# Patient Record
Sex: Female | Born: 1960
Health system: Southern US, Community
[De-identification: ages and names within clinical notes are randomized; demographics above are authoritative.]

## PROBLEM LIST (undated history)

## (undated) DIAGNOSIS — K589 Irritable bowel syndrome without diarrhea: Secondary | ICD-10-CM

## (undated) DIAGNOSIS — F17200 Nicotine dependence, unspecified, uncomplicated: Secondary | ICD-10-CM

## (undated) DIAGNOSIS — M549 Dorsalgia, unspecified: Secondary | ICD-10-CM

## (undated) DIAGNOSIS — M797 Fibromyalgia: Secondary | ICD-10-CM

## (undated) DIAGNOSIS — B009 Herpesviral infection, unspecified: Secondary | ICD-10-CM

## (undated) DIAGNOSIS — J302 Other seasonal allergic rhinitis: Secondary | ICD-10-CM

## (undated) DIAGNOSIS — M542 Cervicalgia: Secondary | ICD-10-CM

## (undated) DIAGNOSIS — F32A Depression, unspecified: Secondary | ICD-10-CM

## (undated) DIAGNOSIS — M25519 Pain in unspecified shoulder: Secondary | ICD-10-CM

## (undated) DIAGNOSIS — I700 Atherosclerosis of aorta: Secondary | ICD-10-CM

## (undated) DIAGNOSIS — IMO0002 Reserved for concepts with insufficient information to code with codable children: Secondary | ICD-10-CM

## (undated) DIAGNOSIS — Z923 Personal history of irradiation: Secondary | ICD-10-CM

## (undated) DIAGNOSIS — K219 Gastro-esophageal reflux disease without esophagitis: Secondary | ICD-10-CM

## (undated) DIAGNOSIS — F329 Major depressive disorder, single episode, unspecified: Secondary | ICD-10-CM

## (undated) DIAGNOSIS — M5416 Radiculopathy, lumbar region: Secondary | ICD-10-CM

## (undated) DIAGNOSIS — G8929 Other chronic pain: Secondary | ICD-10-CM

## (undated) DIAGNOSIS — Z8719 Personal history of other diseases of the digestive system: Secondary | ICD-10-CM

## (undated) DIAGNOSIS — R011 Cardiac murmur, unspecified: Secondary | ICD-10-CM

## (undated) DIAGNOSIS — M719 Bursopathy, unspecified: Secondary | ICD-10-CM

## (undated) DIAGNOSIS — F419 Anxiety disorder, unspecified: Secondary | ICD-10-CM

## (undated) DIAGNOSIS — E041 Nontoxic single thyroid nodule: Secondary | ICD-10-CM

## (undated) DIAGNOSIS — M1990 Unspecified osteoarthritis, unspecified site: Secondary | ICD-10-CM

## (undated) DIAGNOSIS — Z8521 Personal history of malignant neoplasm of larynx: Secondary | ICD-10-CM

## (undated) DIAGNOSIS — J449 Chronic obstructive pulmonary disease, unspecified: Secondary | ICD-10-CM

## (undated) DIAGNOSIS — E039 Hypothyroidism, unspecified: Secondary | ICD-10-CM

## (undated) HISTORY — DX: Herpesviral infection, unspecified: B00.9

## (undated) HISTORY — DX: Irritable bowel syndrome, unspecified: K58.9

## (undated) HISTORY — DX: Cardiac murmur, unspecified: R01.1

## (undated) HISTORY — DX: Anxiety disorder, unspecified: F41.9

## (undated) HISTORY — PX: HERNIA REPAIR: SHX51

## (undated) HISTORY — DX: Depression, unspecified: F32.A

## (undated) HISTORY — DX: Major depressive disorder, single episode, unspecified: F32.9

## (undated) HISTORY — PX: CHOLECYSTECTOMY: SHX55

## (undated) HISTORY — DX: Other seasonal allergic rhinitis: J30.2

## (undated) HISTORY — DX: Gastro-esophageal reflux disease without esophagitis: K21.9

## (undated) HISTORY — PX: HIATAL HERNIA REPAIR: SHX195

## (undated) HISTORY — DX: Unspecified osteoarthritis, unspecified site: M19.90

## (undated) HISTORY — DX: Bursopathy, unspecified: M71.9

## (undated) HISTORY — DX: Nontoxic single thyroid nodule: E04.1

## (undated) HISTORY — DX: Reserved for concepts with insufficient information to code with codable children: IMO0002

## (undated) HISTORY — DX: Fibromyalgia: M79.7

---
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
## 1998-04-27 ENCOUNTER — Emergency Department (HOSPITAL_COMMUNITY): Admission: EM | Admit: 1998-04-27 | Discharge: 1998-04-27 | Payer: Self-pay | Admitting: Emergency Medicine

## 1998-05-16 ENCOUNTER — Emergency Department (HOSPITAL_COMMUNITY): Admission: EM | Admit: 1998-05-16 | Discharge: 1998-05-16 | Payer: Self-pay | Admitting: Internal Medicine

## 1998-06-03 ENCOUNTER — Ambulatory Visit (HOSPITAL_COMMUNITY): Admission: RE | Admit: 1998-06-03 | Discharge: 1998-06-03 | Payer: Self-pay | Admitting: *Deleted

## 1998-06-09 ENCOUNTER — Emergency Department (HOSPITAL_COMMUNITY): Admission: EM | Admit: 1998-06-09 | Discharge: 1998-06-09 | Payer: Self-pay | Admitting: Emergency Medicine

## 1998-06-11 ENCOUNTER — Emergency Department (HOSPITAL_COMMUNITY): Admission: EM | Admit: 1998-06-11 | Discharge: 1998-06-11 | Payer: Self-pay | Admitting: Emergency Medicine

## 1998-06-12 ENCOUNTER — Emergency Department (HOSPITAL_COMMUNITY): Admission: EM | Admit: 1998-06-12 | Discharge: 1998-06-13 | Payer: Self-pay | Admitting: Endocrinology

## 1998-06-21 ENCOUNTER — Encounter: Admission: RE | Admit: 1998-06-21 | Discharge: 1998-06-21 | Payer: Self-pay | Admitting: Hematology and Oncology

## 1999-01-25 ENCOUNTER — Emergency Department (HOSPITAL_COMMUNITY): Admission: EM | Admit: 1999-01-25 | Discharge: 1999-01-25 | Payer: Self-pay

## 1999-05-18 ENCOUNTER — Emergency Department (HOSPITAL_COMMUNITY): Admission: EM | Admit: 1999-05-18 | Discharge: 1999-05-18 | Payer: Self-pay | Admitting: Emergency Medicine

## 1999-06-15 ENCOUNTER — Emergency Department (HOSPITAL_COMMUNITY): Admission: EM | Admit: 1999-06-15 | Discharge: 1999-06-15 | Payer: Self-pay | Admitting: Emergency Medicine

## 1999-07-18 ENCOUNTER — Emergency Department (HOSPITAL_COMMUNITY): Admission: EM | Admit: 1999-07-18 | Discharge: 1999-07-18 | Payer: Self-pay | Admitting: Emergency Medicine

## 1999-07-18 ENCOUNTER — Encounter: Payer: Self-pay | Admitting: Emergency Medicine

## 1999-08-30 ENCOUNTER — Ambulatory Visit (HOSPITAL_COMMUNITY): Admission: RE | Admit: 1999-08-30 | Discharge: 1999-08-30 | Payer: Self-pay | Admitting: Gastroenterology

## 1999-08-30 ENCOUNTER — Encounter (INDEPENDENT_AMBULATORY_CARE_PROVIDER_SITE_OTHER): Payer: Self-pay | Admitting: Specialist

## 1999-12-12 ENCOUNTER — Encounter: Payer: Self-pay | Admitting: Emergency Medicine

## 1999-12-12 ENCOUNTER — Emergency Department (HOSPITAL_COMMUNITY): Admission: EM | Admit: 1999-12-12 | Discharge: 1999-12-12 | Payer: Self-pay | Admitting: Internal Medicine

## 1999-12-26 ENCOUNTER — Emergency Department (HOSPITAL_COMMUNITY): Admission: EM | Admit: 1999-12-26 | Discharge: 1999-12-27 | Payer: Self-pay | Admitting: Internal Medicine

## 2000-04-04 ENCOUNTER — Inpatient Hospital Stay (HOSPITAL_COMMUNITY): Admission: AD | Admit: 2000-04-04 | Discharge: 2000-04-04 | Payer: Self-pay | Admitting: Obstetrics and Gynecology

## 2000-04-05 ENCOUNTER — Other Ambulatory Visit: Admission: RE | Admit: 2000-04-05 | Discharge: 2000-04-05 | Payer: Self-pay | Admitting: Obstetrics & Gynecology

## 2000-07-14 ENCOUNTER — Emergency Department (HOSPITAL_COMMUNITY): Admission: EM | Admit: 2000-07-14 | Discharge: 2000-07-14 | Payer: Self-pay | Admitting: Emergency Medicine

## 2000-07-15 ENCOUNTER — Emergency Department (HOSPITAL_COMMUNITY): Admission: EM | Admit: 2000-07-15 | Discharge: 2000-07-15 | Payer: Self-pay | Admitting: Emergency Medicine

## 2000-09-21 ENCOUNTER — Encounter: Payer: Self-pay | Admitting: Emergency Medicine

## 2000-09-21 ENCOUNTER — Emergency Department (HOSPITAL_COMMUNITY): Admission: EM | Admit: 2000-09-21 | Discharge: 2000-09-21 | Payer: Self-pay | Admitting: Emergency Medicine

## 2000-10-10 ENCOUNTER — Emergency Department (HOSPITAL_COMMUNITY): Admission: EM | Admit: 2000-10-10 | Discharge: 2000-10-10 | Payer: Self-pay | Admitting: Emergency Medicine

## 2000-10-10 ENCOUNTER — Encounter: Payer: Self-pay | Admitting: Emergency Medicine

## 2001-01-03 ENCOUNTER — Emergency Department (HOSPITAL_COMMUNITY): Admission: EM | Admit: 2001-01-03 | Discharge: 2001-01-03 | Payer: Self-pay | Admitting: Emergency Medicine

## 2001-04-10 ENCOUNTER — Emergency Department (HOSPITAL_COMMUNITY): Admission: EM | Admit: 2001-04-10 | Discharge: 2001-04-10 | Payer: Self-pay | Admitting: Unknown Physician Specialty

## 2001-04-15 ENCOUNTER — Encounter: Admission: RE | Admit: 2001-04-15 | Discharge: 2001-04-15 | Payer: Self-pay | Admitting: Hematology and Oncology

## 2001-04-23 ENCOUNTER — Encounter: Payer: Self-pay | Admitting: Emergency Medicine

## 2001-04-23 ENCOUNTER — Emergency Department (HOSPITAL_COMMUNITY): Admission: EM | Admit: 2001-04-23 | Discharge: 2001-04-23 | Payer: Self-pay | Admitting: Emergency Medicine

## 2001-06-09 ENCOUNTER — Emergency Department (HOSPITAL_COMMUNITY): Admission: EM | Admit: 2001-06-09 | Discharge: 2001-06-09 | Payer: Self-pay

## 2001-07-23 ENCOUNTER — Encounter: Payer: Self-pay | Admitting: Emergency Medicine

## 2001-07-23 ENCOUNTER — Emergency Department (HOSPITAL_COMMUNITY): Admission: EM | Admit: 2001-07-23 | Discharge: 2001-07-23 | Payer: Self-pay | Admitting: Emergency Medicine

## 2001-07-24 ENCOUNTER — Emergency Department (HOSPITAL_COMMUNITY): Admission: EM | Admit: 2001-07-24 | Discharge: 2001-07-24 | Payer: Self-pay | Admitting: Emergency Medicine

## 2001-07-26 ENCOUNTER — Encounter: Payer: Self-pay | Admitting: Emergency Medicine

## 2001-07-26 ENCOUNTER — Emergency Department (HOSPITAL_COMMUNITY): Admission: EM | Admit: 2001-07-26 | Discharge: 2001-07-26 | Payer: Self-pay | Admitting: Emergency Medicine

## 2001-07-30 ENCOUNTER — Emergency Department (HOSPITAL_COMMUNITY): Admission: EM | Admit: 2001-07-30 | Discharge: 2001-07-30 | Payer: Self-pay | Admitting: Emergency Medicine

## 2001-07-30 ENCOUNTER — Encounter: Payer: Self-pay | Admitting: Gastroenterology

## 2001-07-30 ENCOUNTER — Ambulatory Visit (HOSPITAL_COMMUNITY): Admission: RE | Admit: 2001-07-30 | Discharge: 2001-07-30 | Payer: Self-pay | Admitting: Gastroenterology

## 2001-07-31 ENCOUNTER — Emergency Department (HOSPITAL_COMMUNITY): Admission: EM | Admit: 2001-07-31 | Discharge: 2001-08-01 | Payer: Self-pay | Admitting: Emergency Medicine

## 2001-08-01 ENCOUNTER — Emergency Department (HOSPITAL_COMMUNITY): Admission: EM | Admit: 2001-08-01 | Discharge: 2001-08-01 | Payer: Self-pay | Admitting: *Deleted

## 2001-08-05 ENCOUNTER — Ambulatory Visit (HOSPITAL_COMMUNITY): Admission: RE | Admit: 2001-08-05 | Discharge: 2001-08-05 | Payer: Self-pay | Admitting: Cardiology

## 2001-08-06 ENCOUNTER — Emergency Department (HOSPITAL_COMMUNITY): Admission: EM | Admit: 2001-08-06 | Discharge: 2001-08-06 | Payer: Self-pay | Admitting: Emergency Medicine

## 2001-08-15 ENCOUNTER — Other Ambulatory Visit: Admission: RE | Admit: 2001-08-15 | Discharge: 2001-08-15 | Payer: Self-pay | Admitting: Obstetrics & Gynecology

## 2001-11-20 ENCOUNTER — Emergency Department (HOSPITAL_COMMUNITY): Admission: EM | Admit: 2001-11-20 | Discharge: 2001-11-20 | Payer: Self-pay | Admitting: Emergency Medicine

## 2002-07-03 ENCOUNTER — Emergency Department (HOSPITAL_COMMUNITY): Admission: EM | Admit: 2002-07-03 | Discharge: 2002-07-03 | Payer: Self-pay | Admitting: Emergency Medicine

## 2003-06-15 ENCOUNTER — Emergency Department (HOSPITAL_COMMUNITY): Admission: EM | Admit: 2003-06-15 | Discharge: 2003-06-15 | Payer: Self-pay | Admitting: Emergency Medicine

## 2004-01-19 ENCOUNTER — Emergency Department (HOSPITAL_COMMUNITY): Admission: EM | Admit: 2004-01-19 | Discharge: 2004-01-19 | Payer: Self-pay | Admitting: Emergency Medicine

## 2004-06-20 ENCOUNTER — Emergency Department (HOSPITAL_COMMUNITY): Admission: EM | Admit: 2004-06-20 | Discharge: 2004-06-20 | Payer: Self-pay | Admitting: Emergency Medicine

## 2004-07-20 ENCOUNTER — Encounter: Admission: RE | Admit: 2004-07-20 | Discharge: 2004-07-20 | Payer: Self-pay | Admitting: Sports Medicine

## 2004-08-02 ENCOUNTER — Ambulatory Visit: Payer: Self-pay | Admitting: Family Medicine

## 2004-08-05 ENCOUNTER — Emergency Department (HOSPITAL_COMMUNITY): Admission: EM | Admit: 2004-08-05 | Discharge: 2004-08-05 | Payer: Self-pay | Admitting: Emergency Medicine

## 2004-08-09 ENCOUNTER — Emergency Department (HOSPITAL_COMMUNITY): Admission: EM | Admit: 2004-08-09 | Discharge: 2004-08-09 | Payer: Self-pay | Admitting: Emergency Medicine

## 2004-08-23 ENCOUNTER — Ambulatory Visit: Payer: Self-pay | Admitting: Sports Medicine

## 2004-08-31 ENCOUNTER — Ambulatory Visit (HOSPITAL_COMMUNITY): Admission: RE | Admit: 2004-08-31 | Discharge: 2004-08-31 | Payer: Self-pay | Admitting: Gastroenterology

## 2004-09-06 ENCOUNTER — Emergency Department (HOSPITAL_COMMUNITY): Admission: EM | Admit: 2004-09-06 | Discharge: 2004-09-06 | Payer: Self-pay | Admitting: Emergency Medicine

## 2004-09-15 ENCOUNTER — Ambulatory Visit: Payer: Self-pay | Admitting: Sports Medicine

## 2004-12-14 ENCOUNTER — Emergency Department (HOSPITAL_COMMUNITY): Admission: EM | Admit: 2004-12-14 | Discharge: 2004-12-14 | Payer: Self-pay | Admitting: Emergency Medicine

## 2005-01-03 ENCOUNTER — Ambulatory Visit: Payer: Self-pay | Admitting: Family Medicine

## 2005-01-31 ENCOUNTER — Ambulatory Visit: Payer: Self-pay | Admitting: Family Medicine

## 2005-02-27 ENCOUNTER — Ambulatory Visit (HOSPITAL_COMMUNITY): Admission: RE | Admit: 2005-02-27 | Discharge: 2005-02-27 | Payer: Self-pay | Admitting: Gastroenterology

## 2005-06-04 ENCOUNTER — Encounter: Admission: RE | Admit: 2005-06-04 | Discharge: 2005-06-04 | Payer: Self-pay | Admitting: Gastroenterology

## 2005-06-06 ENCOUNTER — Ambulatory Visit: Payer: Self-pay | Admitting: Family Medicine

## 2005-07-16 ENCOUNTER — Encounter: Admission: RE | Admit: 2005-07-16 | Discharge: 2005-07-16 | Payer: Self-pay | Admitting: Sports Medicine

## 2005-07-18 ENCOUNTER — Ambulatory Visit: Payer: Self-pay | Admitting: Family Medicine

## 2005-07-23 ENCOUNTER — Ambulatory Visit: Payer: Self-pay | Admitting: Family Medicine

## 2005-08-03 ENCOUNTER — Ambulatory Visit: Payer: Self-pay | Admitting: Family Medicine

## 2005-08-23 ENCOUNTER — Ambulatory Visit: Payer: Self-pay | Admitting: Family Medicine

## 2005-08-24 ENCOUNTER — Encounter: Admission: RE | Admit: 2005-08-24 | Discharge: 2005-08-24 | Payer: Self-pay | Admitting: Sports Medicine

## 2005-10-10 ENCOUNTER — Ambulatory Visit: Payer: Self-pay | Admitting: Family Medicine

## 2005-11-16 ENCOUNTER — Emergency Department (HOSPITAL_COMMUNITY): Admission: EM | Admit: 2005-11-16 | Discharge: 2005-11-16 | Payer: Self-pay | Admitting: Emergency Medicine

## 2006-01-07 ENCOUNTER — Emergency Department (HOSPITAL_COMMUNITY): Admission: EM | Admit: 2006-01-07 | Discharge: 2006-01-07 | Payer: Self-pay | Admitting: Emergency Medicine

## 2006-01-11 ENCOUNTER — Ambulatory Visit: Payer: Self-pay | Admitting: Sports Medicine

## 2006-01-15 ENCOUNTER — Ambulatory Visit (HOSPITAL_COMMUNITY): Admission: RE | Admit: 2006-01-15 | Discharge: 2006-01-15 | Payer: Self-pay | Admitting: Family Medicine

## 2006-02-13 ENCOUNTER — Encounter (HOSPITAL_COMMUNITY): Admission: RE | Admit: 2006-02-13 | Discharge: 2006-05-14 | Payer: Self-pay | Admitting: Otolaryngology

## 2006-03-29 ENCOUNTER — Ambulatory Visit: Payer: Self-pay | Admitting: Family Medicine

## 2006-04-30 ENCOUNTER — Ambulatory Visit: Payer: Self-pay | Admitting: Family Medicine

## 2006-06-12 ENCOUNTER — Ambulatory Visit: Payer: Self-pay | Admitting: Family Medicine

## 2006-06-14 ENCOUNTER — Ambulatory Visit: Payer: Self-pay | Admitting: Family Medicine

## 2006-12-16 ENCOUNTER — Ambulatory Visit (HOSPITAL_COMMUNITY): Admission: RE | Admit: 2006-12-16 | Discharge: 2006-12-16 | Payer: Self-pay | Admitting: Gastroenterology

## 2007-01-20 ENCOUNTER — Ambulatory Visit: Payer: Self-pay | Admitting: Family Medicine

## 2007-01-20 ENCOUNTER — Encounter (INDEPENDENT_AMBULATORY_CARE_PROVIDER_SITE_OTHER): Payer: Self-pay | Admitting: Family Medicine

## 2007-01-20 LAB — CONVERTED CEMR LAB
ALT: 9 units/L (ref 0–35)
AST: 15 units/L (ref 0–37)
Albumin: 3.9 g/dL (ref 3.5–5.2)
Alkaline Phosphatase: 71 units/L (ref 39–117)
BUN: 5 mg/dL — ABNORMAL LOW (ref 6–23)
CO2: 26 meq/L (ref 19–32)
Calcium: 9 mg/dL (ref 8.4–10.5)
Chloride: 107 meq/L (ref 96–112)
Creatinine, Ser: 0.55 mg/dL (ref 0.40–1.20)
Free T4: 1.18 ng/dL (ref 0.89–1.80)
Glucose, Bld: 74 mg/dL (ref 70–99)
INR: 1 (ref 0.0–1.5)
Potassium: 4 meq/L (ref 3.5–5.3)
Prothrombin Time: 12.8 s (ref 11.6–15.2)
Sodium: 141 meq/L (ref 135–145)
TSH: 0.663 microintl units/mL (ref 0.350–5.50)
Total Bilirubin: 0.5 mg/dL (ref 0.3–1.2)
Total Protein: 6.6 g/dL (ref 6.0–8.3)

## 2007-01-23 ENCOUNTER — Ambulatory Visit (HOSPITAL_COMMUNITY): Admission: RE | Admit: 2007-01-23 | Discharge: 2007-01-23 | Payer: Self-pay | Admitting: Family Medicine

## 2007-01-23 DIAGNOSIS — F419 Anxiety disorder, unspecified: Secondary | ICD-10-CM | POA: Insufficient documentation

## 2007-01-23 DIAGNOSIS — K219 Gastro-esophageal reflux disease without esophagitis: Secondary | ICD-10-CM | POA: Insufficient documentation

## 2007-01-23 DIAGNOSIS — F17200 Nicotine dependence, unspecified, uncomplicated: Secondary | ICD-10-CM | POA: Insufficient documentation

## 2007-01-24 ENCOUNTER — Ambulatory Visit (HOSPITAL_COMMUNITY): Admission: RE | Admit: 2007-01-24 | Discharge: 2007-01-24 | Payer: Self-pay | Admitting: Family Medicine

## 2007-04-30 ENCOUNTER — Ambulatory Visit: Payer: Self-pay | Admitting: Family Medicine

## 2007-05-01 ENCOUNTER — Telehealth: Payer: Self-pay | Admitting: *Deleted

## 2007-05-01 ENCOUNTER — Telehealth (INDEPENDENT_AMBULATORY_CARE_PROVIDER_SITE_OTHER): Payer: Self-pay | Admitting: *Deleted

## 2007-07-08 ENCOUNTER — Ambulatory Visit: Payer: Self-pay | Admitting: Family Medicine

## 2007-07-08 ENCOUNTER — Encounter (INDEPENDENT_AMBULATORY_CARE_PROVIDER_SITE_OTHER): Payer: Self-pay | Admitting: Family Medicine

## 2007-07-11 ENCOUNTER — Ambulatory Visit: Payer: Self-pay | Admitting: Family Medicine

## 2007-08-27 ENCOUNTER — Encounter (INDEPENDENT_AMBULATORY_CARE_PROVIDER_SITE_OTHER): Payer: Self-pay | Admitting: *Deleted

## 2007-09-29 ENCOUNTER — Ambulatory Visit: Payer: Self-pay | Admitting: Family Medicine

## 2007-10-09 ENCOUNTER — Encounter (INDEPENDENT_AMBULATORY_CARE_PROVIDER_SITE_OTHER): Payer: Self-pay | Admitting: Family Medicine

## 2007-11-02 ENCOUNTER — Emergency Department (HOSPITAL_COMMUNITY): Admission: EM | Admit: 2007-11-02 | Discharge: 2007-11-02 | Payer: Self-pay | Admitting: Emergency Medicine

## 2007-11-03 ENCOUNTER — Telehealth: Payer: Self-pay | Admitting: *Deleted

## 2007-11-12 ENCOUNTER — Ambulatory Visit: Payer: Self-pay | Admitting: Family Medicine

## 2007-11-12 ENCOUNTER — Telehealth: Payer: Self-pay | Admitting: *Deleted

## 2007-11-18 ENCOUNTER — Telehealth: Payer: Self-pay | Admitting: *Deleted

## 2007-12-31 ENCOUNTER — Ambulatory Visit: Payer: Self-pay | Admitting: Family Medicine

## 2007-12-31 ENCOUNTER — Telehealth: Payer: Self-pay | Admitting: *Deleted

## 2007-12-31 ENCOUNTER — Encounter (INDEPENDENT_AMBULATORY_CARE_PROVIDER_SITE_OTHER): Payer: Self-pay | Admitting: Family Medicine

## 2007-12-31 LAB — CONVERTED CEMR LAB
BUN: 8 mg/dL (ref 6–23)
CO2: 23 meq/L (ref 19–32)
Calcium: 9.4 mg/dL (ref 8.4–10.5)
Chloride: 106 meq/L (ref 96–112)
Creatinine, Ser: 0.62 mg/dL (ref 0.40–1.20)
Glucose, Bld: 101 mg/dL — ABNORMAL HIGH (ref 70–99)
HCT: 41.2 % (ref 36.0–46.0)
Hemoglobin: 13.3 g/dL (ref 12.0–15.0)
MCHC: 32.3 g/dL (ref 30.0–36.0)
MCV: 87.3 fL (ref 78.0–100.0)
Platelets: 292 10*3/uL (ref 150–400)
Potassium: 4.5 meq/L (ref 3.5–5.3)
RBC: 4.72 M/uL (ref 3.87–5.11)
RDW: 15.9 % — ABNORMAL HIGH (ref 11.5–15.5)
Sodium: 140 meq/L (ref 135–145)
TSH: 0.522 microintl units/mL (ref 0.350–5.50)
WBC: 7.5 10*3/uL (ref 4.0–10.5)

## 2008-01-07 ENCOUNTER — Telehealth: Payer: Self-pay | Admitting: *Deleted

## 2008-01-08 ENCOUNTER — Ambulatory Visit (HOSPITAL_COMMUNITY): Admission: RE | Admit: 2008-01-08 | Discharge: 2008-01-08 | Payer: Self-pay | Admitting: Family Medicine

## 2008-01-22 ENCOUNTER — Emergency Department (HOSPITAL_COMMUNITY): Admission: EM | Admit: 2008-01-22 | Discharge: 2008-01-22 | Payer: Self-pay | Admitting: Emergency Medicine

## 2008-01-26 ENCOUNTER — Inpatient Hospital Stay (HOSPITAL_COMMUNITY): Admission: EM | Admit: 2008-01-26 | Discharge: 2008-01-26 | Payer: Self-pay | Admitting: Emergency Medicine

## 2008-01-27 ENCOUNTER — Telehealth: Payer: Self-pay | Admitting: *Deleted

## 2008-01-28 ENCOUNTER — Telehealth (INDEPENDENT_AMBULATORY_CARE_PROVIDER_SITE_OTHER): Payer: Self-pay | Admitting: Family Medicine

## 2008-02-05 ENCOUNTER — Ambulatory Visit: Payer: Self-pay | Admitting: Sports Medicine

## 2008-02-05 ENCOUNTER — Telehealth: Payer: Self-pay | Admitting: *Deleted

## 2008-02-12 ENCOUNTER — Encounter: Payer: Self-pay | Admitting: *Deleted

## 2008-02-16 ENCOUNTER — Telehealth: Payer: Self-pay | Admitting: Family Medicine

## 2008-03-02 ENCOUNTER — Ambulatory Visit: Payer: Self-pay | Admitting: Family Medicine

## 2008-03-02 DIAGNOSIS — J309 Allergic rhinitis, unspecified: Secondary | ICD-10-CM | POA: Insufficient documentation

## 2008-04-08 ENCOUNTER — Telehealth: Payer: Self-pay | Admitting: Family Medicine

## 2008-04-27 ENCOUNTER — Telehealth: Payer: Self-pay | Admitting: *Deleted

## 2008-05-10 ENCOUNTER — Encounter: Payer: Self-pay | Admitting: *Deleted

## 2008-05-15 ENCOUNTER — Emergency Department (HOSPITAL_COMMUNITY): Admission: EM | Admit: 2008-05-15 | Discharge: 2008-05-15 | Payer: Self-pay | Admitting: Emergency Medicine

## 2008-05-18 ENCOUNTER — Telehealth: Payer: Self-pay | Admitting: *Deleted

## 2008-05-20 ENCOUNTER — Ambulatory Visit: Payer: Self-pay | Admitting: Family Medicine

## 2008-06-08 ENCOUNTER — Ambulatory Visit: Payer: Self-pay | Admitting: Family Medicine

## 2008-06-10 ENCOUNTER — Ambulatory Visit: Payer: Self-pay | Admitting: Family Medicine

## 2008-06-18 ENCOUNTER — Inpatient Hospital Stay (HOSPITAL_COMMUNITY): Admission: RE | Admit: 2008-06-18 | Discharge: 2008-06-19 | Payer: Self-pay | Admitting: General Surgery

## 2008-06-20 ENCOUNTER — Emergency Department (HOSPITAL_COMMUNITY): Admission: EM | Admit: 2008-06-20 | Discharge: 2008-06-20 | Payer: Self-pay | Admitting: Emergency Medicine

## 2008-06-25 ENCOUNTER — Telehealth: Payer: Self-pay | Admitting: *Deleted

## 2008-06-25 ENCOUNTER — Ambulatory Visit: Payer: Self-pay | Admitting: Family Medicine

## 2008-06-25 ENCOUNTER — Encounter: Payer: Self-pay | Admitting: Family Medicine

## 2008-06-25 LAB — CONVERTED CEMR LAB
HCT: 38.6 % (ref 36.0–46.0)
Hemoglobin: 12.6 g/dL (ref 12.0–15.0)
MCHC: 32.6 g/dL (ref 30.0–36.0)
MCV: 88.1 fL (ref 78.0–100.0)
Platelets: 395 10*3/uL (ref 150–400)
RBC: 4.38 M/uL (ref 3.87–5.11)
RDW: 16.6 % — ABNORMAL HIGH (ref 11.5–15.5)
WBC: 9 10*3/uL (ref 4.0–10.5)

## 2008-06-28 ENCOUNTER — Telehealth: Payer: Self-pay | Admitting: *Deleted

## 2008-10-09 ENCOUNTER — Emergency Department (HOSPITAL_COMMUNITY): Admission: EM | Admit: 2008-10-09 | Discharge: 2008-10-09 | Payer: Self-pay | Admitting: Emergency Medicine

## 2008-10-28 ENCOUNTER — Encounter: Payer: Self-pay | Admitting: Family Medicine

## 2008-10-28 ENCOUNTER — Encounter (INDEPENDENT_AMBULATORY_CARE_PROVIDER_SITE_OTHER): Payer: Self-pay | Admitting: *Deleted

## 2008-10-28 ENCOUNTER — Telehealth: Payer: Self-pay | Admitting: *Deleted

## 2008-10-30 ENCOUNTER — Emergency Department (HOSPITAL_COMMUNITY): Admission: EM | Admit: 2008-10-30 | Discharge: 2008-10-30 | Payer: Self-pay | Admitting: Emergency Medicine

## 2008-11-10 ENCOUNTER — Encounter: Payer: Self-pay | Admitting: Family Medicine

## 2008-11-20 ENCOUNTER — Emergency Department (HOSPITAL_COMMUNITY): Admission: EM | Admit: 2008-11-20 | Discharge: 2008-11-20 | Payer: Self-pay | Admitting: Emergency Medicine

## 2008-11-24 ENCOUNTER — Encounter: Payer: Self-pay | Admitting: Family Medicine

## 2008-12-03 ENCOUNTER — Telehealth: Payer: Self-pay | Admitting: *Deleted

## 2009-02-10 ENCOUNTER — Ambulatory Visit: Payer: Self-pay | Admitting: Family Medicine

## 2009-02-21 ENCOUNTER — Encounter: Payer: Self-pay | Admitting: Family Medicine

## 2009-03-21 ENCOUNTER — Emergency Department (HOSPITAL_COMMUNITY): Admission: EM | Admit: 2009-03-21 | Discharge: 2009-03-21 | Payer: Self-pay | Admitting: Emergency Medicine

## 2009-05-11 ENCOUNTER — Telehealth: Payer: Self-pay | Admitting: Family Medicine

## 2009-05-25 ENCOUNTER — Telehealth (INDEPENDENT_AMBULATORY_CARE_PROVIDER_SITE_OTHER): Payer: Self-pay | Admitting: *Deleted

## 2009-06-03 ENCOUNTER — Ambulatory Visit (HOSPITAL_COMMUNITY): Admission: RE | Admit: 2009-06-03 | Discharge: 2009-06-03 | Payer: Self-pay | Admitting: Family Medicine

## 2009-06-06 ENCOUNTER — Telehealth: Payer: Self-pay | Admitting: Family Medicine

## 2009-06-24 ENCOUNTER — Telehealth (INDEPENDENT_AMBULATORY_CARE_PROVIDER_SITE_OTHER): Payer: Self-pay | Admitting: *Deleted

## 2009-07-01 ENCOUNTER — Ambulatory Visit: Payer: Self-pay | Admitting: Family Medicine

## 2009-07-01 ENCOUNTER — Encounter: Payer: Self-pay | Admitting: Family Medicine

## 2009-07-01 DIAGNOSIS — E042 Nontoxic multinodular goiter: Secondary | ICD-10-CM | POA: Insufficient documentation

## 2009-07-01 DIAGNOSIS — IMO0001 Reserved for inherently not codable concepts without codable children: Secondary | ICD-10-CM | POA: Insufficient documentation

## 2009-07-01 LAB — CONVERTED CEMR LAB: TSH: 0.688 microintl units/mL (ref 0.350–4.500)

## 2009-07-04 ENCOUNTER — Encounter: Payer: Self-pay | Admitting: Family Medicine

## 2009-09-01 ENCOUNTER — Observation Stay (HOSPITAL_COMMUNITY): Admission: EM | Admit: 2009-09-01 | Discharge: 2009-09-01 | Payer: Self-pay | Admitting: Emergency Medicine

## 2009-09-05 ENCOUNTER — Telehealth: Payer: Self-pay | Admitting: *Deleted

## 2009-12-18 ENCOUNTER — Emergency Department (HOSPITAL_COMMUNITY): Admission: EM | Admit: 2009-12-18 | Discharge: 2009-12-18 | Payer: Self-pay | Admitting: Emergency Medicine

## 2009-12-26 ENCOUNTER — Telehealth: Payer: Self-pay | Admitting: *Deleted

## 2010-01-10 ENCOUNTER — Ambulatory Visit: Payer: Self-pay | Admitting: Family Medicine

## 2010-02-17 ENCOUNTER — Telehealth: Payer: Self-pay | Admitting: Family Medicine

## 2010-02-17 ENCOUNTER — Ambulatory Visit: Payer: Self-pay | Admitting: Family Medicine

## 2010-02-28 ENCOUNTER — Emergency Department (HOSPITAL_COMMUNITY): Admission: EM | Admit: 2010-02-28 | Discharge: 2010-02-28 | Payer: Self-pay | Admitting: Emergency Medicine

## 2010-02-28 ENCOUNTER — Encounter: Payer: Self-pay | Admitting: Family Medicine

## 2010-03-01 ENCOUNTER — Emergency Department (HOSPITAL_COMMUNITY): Admission: EM | Admit: 2010-03-01 | Discharge: 2010-03-01 | Payer: Self-pay | Admitting: Emergency Medicine

## 2010-03-15 ENCOUNTER — Encounter: Payer: Self-pay | Admitting: Family Medicine

## 2010-03-15 ENCOUNTER — Ambulatory Visit: Payer: Self-pay | Admitting: Family Medicine

## 2010-03-15 DIAGNOSIS — R0602 Shortness of breath: Secondary | ICD-10-CM | POA: Insufficient documentation

## 2010-03-16 ENCOUNTER — Ambulatory Visit: Payer: Self-pay | Admitting: Family Medicine

## 2010-03-21 ENCOUNTER — Telehealth: Payer: Self-pay | Admitting: Family Medicine

## 2010-03-30 ENCOUNTER — Emergency Department (HOSPITAL_COMMUNITY): Admission: EM | Admit: 2010-03-30 | Discharge: 2010-03-30 | Payer: Self-pay | Admitting: Emergency Medicine

## 2010-03-30 ENCOUNTER — Telehealth: Payer: Self-pay | Admitting: Family Medicine

## 2010-08-23 ENCOUNTER — Ambulatory Visit: Payer: Self-pay | Admitting: Family Medicine

## 2010-08-23 DIAGNOSIS — K047 Periapical abscess without sinus: Secondary | ICD-10-CM | POA: Insufficient documentation

## 2010-08-24 ENCOUNTER — Telehealth: Payer: Self-pay | Admitting: *Deleted

## 2010-08-29 ENCOUNTER — Telehealth: Payer: Self-pay | Admitting: *Deleted

## 2010-09-05 ENCOUNTER — Telehealth: Payer: Self-pay | Admitting: *Deleted

## 2010-09-15 ENCOUNTER — Telehealth: Payer: Self-pay | Admitting: *Deleted

## 2010-10-27 ENCOUNTER — Ambulatory Visit: Payer: Self-pay | Admitting: Family Medicine

## 2010-11-03 ENCOUNTER — Telehealth (INDEPENDENT_AMBULATORY_CARE_PROVIDER_SITE_OTHER): Payer: Self-pay | Admitting: *Deleted

## 2010-11-16 ENCOUNTER — Encounter: Payer: Self-pay | Admitting: Family Medicine

## 2010-12-05 ENCOUNTER — Ambulatory Visit: Admission: RE | Admit: 2010-12-05 | Discharge: 2010-12-05 | Payer: Self-pay | Source: Home / Self Care

## 2010-12-05 DIAGNOSIS — F449 Dissociative and conversion disorder, unspecified: Secondary | ICD-10-CM | POA: Insufficient documentation

## 2010-12-06 ENCOUNTER — Encounter: Payer: Self-pay | Admitting: Family Medicine

## 2010-12-06 LAB — CONVERTED CEMR LAB: TSH: 1.388 microintl units/mL (ref 0.350–4.500)

## 2010-12-16 ENCOUNTER — Encounter: Payer: Self-pay | Admitting: Sports Medicine

## 2010-12-16 ENCOUNTER — Encounter: Payer: Self-pay | Admitting: Gastroenterology

## 2010-12-17 ENCOUNTER — Encounter: Payer: Self-pay | Admitting: Sports Medicine

## 2010-12-17 ENCOUNTER — Encounter: Payer: Self-pay | Admitting: Family Medicine

## 2010-12-17 ENCOUNTER — Encounter: Payer: Self-pay | Admitting: Otolaryngology

## 2010-12-17 ENCOUNTER — Encounter: Payer: Self-pay | Admitting: Gastroenterology

## 2010-12-22 ENCOUNTER — Emergency Department (HOSPITAL_COMMUNITY)
Admission: EM | Admit: 2010-12-22 | Discharge: 2010-12-22 | Payer: Self-pay | Source: Home / Self Care | Admitting: Emergency Medicine

## 2010-12-26 NOTE — Assessment & Plan Note (Signed)
Summary: surgery wp   Vital Signs:  Patient Profile:   50 Years Old Female Height:     63 inches Weight:      165 pounds Temp:     98.3 degrees F Pulse rate:   77 / minute BP sitting:   127 / 82  (left arm)  Vitals Entered By: Theresia Lo RN (June 25, 2008 1:49 PM)             Is Patient Diabetic? No     PCP:  Ardeen Garland  MD   History of Present Illness: Ms. Desanctis is here to follow-up and discuss several issues: 1) Had hiatal hernia repair surgery by Dr. Abbey Chatters of [facility] on THusday 7/23.  Discharged Saturday.  Went back to ED on Sunday.  Found to be dehydrated, given several liters of fluid and sent home.  Still having rather significant post-op pain but is well controlled with the meds given to her by the surgeon.  Bowels began working again Sunday - had diarrhea up until yesterday.  Has surgical follow-up 8/10  2) Lungs - She was told in June she had a large spot in her lung.  When CXR was repeated in July, was told she had nothing.  Says she has always had asthma.  Takes Advair and albuterol.  She had PFTs done earlier in the month, which were normal.  She wanted to discuss the results of the PFTs and what was meant by the Doctors who told her her previous x-ray findings  3)  Smoking cessation - has been trying to quit unsuccessfully for some time.  Has limited finances.  Used a nicotine patch while in the hospital last week and had no cravings.  Bought some when she got out and her blood pressure went up to 150/100 so she took it off.  Still smoking but down to 5 cigs/day instead of the 1 ppd she was smoking before her surgery.  Wants to know if it is okay to still use the patch despite her BP going up.  Using the patches.  4) BRBPR- has been having red blood per rectum since Sunday.  Started after she had approx 10 diarrhea stools.  Still persisting.  Using a pad and states she changes it about 3 times per day and is starting to feel weak.  Thought it might be her  period.  Put in a tampon and it was clean.  No blood.  When she wipes her anal area, gets blood on the tissue.  CAlled surgeons office.  THey told her it was likely hemorrhoids, to soak in tub and use hemorrhoid cream.  Wants blood count checked.    Current Allergies: SULFAMETHOXAZOLE (SULFAMETHOXAZOLE)    Risk Factors:     Counseled to quit/cut down tobacco use:  yes   Review of Systems       as per H PI   Physical Exam  General:     mild distress - holding pillow to abdomen secondary to pain Lungs:     Normal respiratory effort, chest expands symmetrically. Lungs are clear to auscultation, no crackles or wheezes. Heart:     Normal rate and regular rhythm. S1 and S2 normal without gallop, murmur, click, rub or other extra sounds. Abdomen:     healing epigastric abdominal scar.  pos bowel sounds.  soft Rectal:     No external abnormalities noted.  Genitalia:     bleeding at introitus of vagina    Impression &  Recommendations:  Problem # 1:  RECTAL BLEEDING (ICD-569.3) Assessment: New Upon exam today, appears to be vaginal.  Could also be having intermittent internal hemorrhoid bleeding.  If primary source is menstrual, should be resolved in 7 days.  Advised patient to monitor.  If bleeding no better in 7 days, she is to call back for further instruction.  Will also obtain CBC today given the fact she is feeling weak, though could also be due to the fact she had major surgery last week and just got over a bout of diarrhea.  Will call patient will result.   Orders: [facility] (19147) [facility]- Est  Level 4 (82956)   Problem # 2:  ? of CHRONIC OBSTRUCTIVE PULMONARY DISEASE, ACUTE (ICD-496) Assessment: Unchanged Normal PFTs.  Discussed this with patient.  Reassurance given that no acute abnormal seen now, but that she will develop more problems if she continues to smoke.  It is only a matter of when.  Patient agrees and understands.  Is trying to quit. Her updated medication list  for this problem includes:    Ventolin Hfa 108 (90 Base) Mcg/act Aers (Albuterol sulfate) .Marland Kitchen... 2-4 puffs every four hours as needed for wheezing    Advair Diskus 250-50 Mcg/dose Misc (Fluticasone-salmeterol) .Marland Kitchen... 1 puff 2 times daily  Orders: [facility]- Est  Level 4 (21308)   Problem # 3:  TOBACCO DEPENDENCE (ICD-305.1) Assessment: Improved Advised to continue patches.  Will try 7 micrograms and go up to 14 if not strong enough.   Now down to 4-5 cgs/day since surgery. Orders: [facility]- Est  Level 4 (99214)   Problem # 4:  ANXIETY (ICD-300.00) Assessment: Deteriorated Refill given on ativan - Ativan 1mg  to take 1/2 tab daily as needed anxiety.  Given 20 pills, no refills.  Told patient will NOT refill before one month.  Told her will not escalate amount up.  Usage is up from previous, but has had surgery, has been trying to quit smoking, and was worried about "xray findings".  Discussed that ativan cannot become a crutch.  AFter she is through these actue problems, will decrease usage/prescription again.  Patient expresses understanding.  Her updated medication list for this problem includes:    Ativan 1 Mg Tabs (Lorazepam) .Marland Kitchen... Take 1/2 tablet by mouth in event of a panic attack  Orders: [facility]- Est  Level 4 (65784)   Complete Medication List: 1)  Ativan 1 Mg Tabs (Lorazepam) .... Take 1/2 tablet by mouth in event of a panic attack 2)  Protonix 40 Mg Tbec (Pantoprazole sodium) .... Take 1 tablet by mouth twice a day 3)  Valtrex 1 Gm Tabs (Valacyclovir hcl) .... Take 1/2 tablet by mouth once a day 4)  Ventolin Hfa 108 (90 Base) Mcg/act Aers (Albuterol sulfate) .... 2-4 puffs every four hours as needed for wheezing 5)  Fluticasone Propionate 50 Mcg/act Susp (Fluticasone propionate) .... 2 sprays each nostril once daily 6)  Advair Diskus 250-50 Mcg/dose Misc (Fluticasone-salmeterol) .Marland Kitchen.. 1 puff 2 times daily   Patient Instructions: 1)  Thank you for coming in today. 2)  On exam today, it  looked as though your bleeding at this point in time is menstrual.  You  may still be having some bleeding from a hemorrhoid I can't see on exam today.  For sure, if you bleeding is not decreased in the next 7 days, as it should be if it is primarily menstrual, then please call back in to the office to let me know. 3)  We  will check your blood count today.  I will call you with the results when they are back early next week.  4)  If you begin to experience gushing blood, feel very faint, or your heart rate becomes elevated over 110, please call the office. 5)  I am proud of you for wanting to quit smoking.  THat is the best thing you can do for your health.  Since you are now down to only 4-5 cigs/day, try the patches.  If that does not control your craving, you can go back to the patches. 6)  Please follow up in 2 months if your bleeding resolves.  If it does not, please call back in for a sooner appointment.   ]  Vital Signs:  Patient Profile:   50 Years Old Female Height:     63 inches Weight:      165 pounds Temp:     98.3 degrees F Pulse rate:   77 / minute BP sitting:   127 / 82

## 2010-12-26 NOTE — Progress Notes (Signed)
Summary: triage  Phone Note Call from Patient Call back at Home Phone [phone]   Reason for Call: Talk to Nurse Summary of Call: pt is requesting to speak with RN, sts she just finished up antibiotics for pneumonia last Thursday and was seen by MD here, everything was fine. Pt woke up today and feels like she is really congested and her mucus is yellow. Thinks this is something completely different, wants to know what to do? Initial call taken by: ERIN LEVAN,  November 18, 2007 2:15 PM  Follow-up for Phone Call        states her sinuses are blocked with thick yellow mucus. feels bad. taking otc. offered appt. has no way to get here. wanted antibiotic called in. told her she needed to be seen, we cannot just call a med in. encouraged her to drink lots of water, sit in steamy bathroom or use humidifier, take tyl as needed & use the otc she has. if no better, consider urgent care when she can get a ride. pt agreed with plan Follow-up by: Golden Circle RN,  November 18, 2007 2:28 PM

## 2010-12-26 NOTE — Progress Notes (Signed)
Summary: Triage  Phone Note Call from Patient Call back at Home Phone [phone]   Summary of Call: Is needing to discuss hospital follow up and she quit smoking a few days ago and is having bad cravings.  Would like to discuss. Initial call taken by: Haydee Salter,  May 18, 2008 10:39 AM  Follow-up for Phone Call        went to ed for sob. diagnosed with emphysema. also quit smoking & is very "jittery & nervous" taking antianxiety meds more frequently. appt made for f/u. she is worried that emphysema will kill her. also wants more meds for anxiety to get her thru this. dtr just moved to [place] from [place] and she talks with her frequently. Names her as a support. appt made. unable to wait for her pcp Follow-up by: Golden Circle RN,  May 18, 2008 11:14 AM

## 2010-12-26 NOTE — Progress Notes (Signed)
Summary: requesting wi appt with pcp  Phone Note Call from Patient Call back at Home Phone [phone]   Reason for Call: Talk to Nurse Summary of Call: pt is requesting a wi appt with Dr. Mannie Stabile, she sts she went to the hospital and was dx with pneumonia and was told to f/u here and we would need to call [facility] for the chest x-ray Initial call taken by: ERIN LEVAN,  November 03, 2007 4:11 PM  Follow-up for Phone Call        went to [facility] ED yesterday & was diagnosed with pnumonia. had 3 breathing tx at hosp. on prednisone & doxyclycline. new asthma pump. needs f/u. pcp not here until wednesday. she will call early wed to see about appt. to call sooner if she gets worse/develops fever Follow-up by: Golden Circle RN,  November 03, 2007 4:16 PM

## 2010-12-26 NOTE — Progress Notes (Signed)
Summary: Rx  Phone Note Refill Request Call back at Home Phone [phone] Message from:  Patient  Refills Requested: Medication #1:  ATIVAN 1 MG TABS Take 1/2 tablet by mouth in event of a panic attack pts brother passed away yesterday, states she hasn't had any anxiety medication in a month and she is going to have a nervous breakdown, just needs enough to get [facility] [address]  Initial call taken by: Haydee Salter,  December 03, 2008 3:00 PM  Follow-up for Phone Call        line busy. message to pcp Follow-up by: Golden Circle RN,  December 03, 2008 3:05 PM  Additional Follow-up for Phone Call Additional follow up Details #1::        states her brother died unexpectedly of what they think was a MI. other bother died 3 yrs ago of same. States she does not have the same father as them so she does not think she is at risk. her doh was sick & they had to put him down this am. children with her now Additional Follow-up by: Golden Circle RN,  December 03, 2008 3:07 PM    Additional Follow-up for Phone Call Additional follow up Details #2::    Will leave prescription at front desk for former anxiety med. Follow-up by: Lamar Laundry, MD January 11th, 2009 09:15  Additional Follow-up for Phone Call Additional follow up Details #3:: Details for Additional Follow-up Action Taken: left message that rx was here for pick up. placed in hanging file up front Additional Follow-up by: Golden Circle RN,  December 06, 2008 2:53 PM

## 2010-12-26 NOTE — Miscellaneous (Signed)
Summary: WALK IN  Clinical Lists Changes walked in c/o persistant cough x 3 days. yellow sputum. stopped the "patch sunday. smoked 2 cig yesterday & 1 so far today.placed in [place]'s sched as  pcp is full. has to be at work by 11.Golden Circle RN  March 15, 2010 10:05 AM

## 2010-12-26 NOTE — Progress Notes (Signed)
Summary: Refill  Phone Note Call from Patient Call back at [facility] Phone [phone]   Summary of Call: Pt is needing a refill on advair pump.  Called [facility] Dept. Friday for refill. Initial call taken by: Haydee Salter,  February 16, 2008 8:49 AM  Follow-up for Phone Call        Will forward to MD as advair not on pt's med list. Follow-up by: [facility] MARTIN RN,  February 16, 2008 8:52 AM  Additional Follow-up for Phone Call Additional follow up Details #1::        Pt is checking status.  She also states she needs a refill on protonix.  She states she called them on 3/20.  Advair has been out since last Thursday, 3/19. Additional Follow-up by: Haydee Salter,  February 17, 2008 11:23 AM    Additional Follow-up for Phone Call Additional follow up Details #2::    Pt is upset that this STILL hasn't been taken care of, nor has anyone bothered to call her to let her know what is going on.  She has no advair left and needs it taken care of ASAP.   Follow-up by: Haydee Salter,  February 19, 2008 10:46 AM  Additional Follow-up for Phone Call Additional follow up Details #3:: Details for Additional Follow-up Action Taken: called pt. did not leave message. do not see advair on pt's meds list. fwd. to dr.Kalai Baca for review. maybe pt needs ov? Additional Follow-up by: Arlyss Repress CMA,,  February 19, 2008 2:08 PM  Patient needs to come to office visit. Has KNKA'd twice with me.  Has appt 03/02/08.  Will discuss at that time.

## 2010-12-26 NOTE — Miscellaneous (Signed)
Summary: Protonix refill  Clinical Lists Changes  Medications: Rx of PROTONIX 40 MG TBEC (PANTOPRAZOLE SODIUM) Take 1 tablet by mouth twice a day;  #60 x 6;  Signed;  Entered by: Ardeen Garland  MD;  Authorized by: Ardeen Garland  MD;  Method used: [facility]  [address]  [id number] [address] [place], [place], [place]  [address], Ph: [phone] or [phone], Fax: [phone]    Prescriptions: PROTONIX 40 MG TBEC (PANTOPRAZOLE SODIUM) Take 1 tablet by mouth twice a day  #60 x 6   Entered and Authorized by:   Ardeen Garland  MD   Signed by:   Ardeen Garland  MD on 10/28/2008   Method used:   [facility] to        [facility]  [address]  [id number]       [address]       [place], [place]  [address]       Ph: [phone] or [phone]       Fax: [phone]   RxID:   [id number]

## 2010-12-26 NOTE — Miscellaneous (Signed)
Summary: dnka/ts  Clinical Lists Changes 

## 2010-12-26 NOTE — Progress Notes (Signed)
Summary: triage  Phone Note Call from Patient Call back at Home Phone [phone]   Caller: Patient Summary of Call: Pt wants to get worked in today because she has a place on her face that is the size of a quarter that is like the flesh is being eat away.  She has pus coming out of it.  Now also has fever. Initial call taken by: Clydell Hakim,  February 17, 2010 1:35 PM  Follow-up for Phone Call        started yesterday as a pimple. this am "hole in my flesh size of a quarter" surrounding redness "size of egg. hx MRSA. has to go to work this pm. work in at 3 with [place] as she had a cancellation at that time. states she also had a small fever last night Follow-up by: Golden Circle RN,  February 17, 2010 1:38 PM

## 2010-12-26 NOTE — Assessment & Plan Note (Signed)
Summary: resch'd from 12/1 for tooth pain/[facility] team/[facility]   Vital Signs:  Patient profile:   50 year old female Height:      63 inches Weight:      168.31 pounds BMI:     29.92 BSA:     1.80 Temp:     98.6 degrees F Pulse rate:   76 / minute BP sitting:   133 / 87  Vitals Entered By: Jone Baseman CMA (October 27, 2010 10:15 AM) CC: tooth pain Is Patient Diabetic? No Pain Assessment Patient in pain? yes     Location: tooth pain Intensity: 8   Primary Care Provider:  . [facility]  CC:  tooth pain.  History of Present Illness: 50 yo female   1. Tooth Abscess: States she has had a hole in her left upper molar for about 3 months and then started having more pain and went to see a dentist, had a tooth abscess and when got xrays showed extension into the maxillary sinus and was sent to see a oral surgeon, but they attempted to charge her 470 dollars which the patient could not afford at one time. Pt was given penicillin which she has now ran out of and feels like she is having more pain and pressure in the sinus and starting to cause pressure in her eye.  Pt states the pain even wraps around to her left ear.  Pt denies any discharge from the nose or from the mouth, is able to eat with food on the other side of her mouth but pt has had fever up to 101 some nausea but no vomiting, no visual or hearing problems just a lot of pain.  She has been taking 400-800mg  of Ibuproden q 3 hours. Ultram and Vicodin/Percocet too sedation. Has used Tylenol #3 in the past - helped some with little sedation. Still working on getting to dentist through emergency referral.  2. Painc Attacks: Stable on Lorazepam for > 1 year. #20 pills lasts  ~ 6 months. Requests refill.  Habits & Providers  Alcohol-Tobacco-Diet     Tobacco Status: quit     Tobacco Counseling: to quit use of tobacco products     Cigarette Packs/Day: <0.25     Year Started: 1985     Pack years: 12     Passive Smoke Exposure:  no  Current Medications (verified): 1)  Ativan 1 Mg Tabs (Lorazepam) .... Take 1/2 Tablet By Mouth in Event of A Panic Attack 2)  Protonix 40 Mg Tbec (Pantoprazole Sodium) .... Take 1 Tablet By Mouth Twice A Day 3)  Valtrex 1 Gm Tabs (Valacyclovir Hcl) .... Take 1/2 Tablet By Mouth Once A Day 4)  Ventolin Hfa 108 (90 Base) Mcg/act  Aers (Albuterol Sulfate) .... 2-4 Puffs Every Four Hours As Needed For Wheezing 5)  Advair Diskus 250-50 Mcg/dose Misc (Fluticasone-Salmeterol) .Marland Kitchen.. 1 Puff 2 Times Daily 6)  Citalopram Hydrobromide 40 Mg Tabs (Citalopram Hydrobromide) .... 1/2 Tab Q Am 7)  Albuterol Sulfate (2.5 Mg/32ml) 0.083% Nebu (Albuterol Sulfate) .Marland Kitchen.. 1 Neb Every 4 Hours As Needed Cough, Difficulty Breathing 8)  Tussionex Pennkinetic Er 8-10 Mg/74ml Lqcr (Chlorpheniramine-Hydrocodone) .Marland Kitchen.. 1 Teaspoon Two Times A Day As Needed Cough 30 Day Supply 9)  Epipen 0.3 Mg/0.63ml Devi (Epinephrine) .... Use As Directed 10)  Flonase 50 Mcg/act Susp (Fluticasone Propionate) .... 2 Sprays Each Nostril Daily 11)  Ibuprofen 800 Mg Tabs (Ibuprofen) .... Take 1 Tab By Mouth Three Times A Day For Pain 12)  Tramadol Hcl 50 Mg Tabs (Tramadol Hcl) .... Take 1 Tab By Mouth Three Times A Day As Needed For Severe Pain 13)  Clindamycin Hcl 150 Mg Caps (Clindamycin Hcl) .... Take 2 Tabs By Mouth Three Times A Day For 10 Days  Allergies (verified): 1)  ! * Shellfish 2)  Sulfamethoxazole (Sulfamethoxazole) PMH-FH-SH reviewed for relevance  Social History: Packs/Day:  <0.25  Review of Systems      See HPI  Physical Exam  General:  Mild distress holding left side of face. Vitals reviewed. Head:  Normocephalic and atraumatic without obvious abnormalities.  Ears:  R ear normal and L ear normal.   Nose:  External nasal examination shows no deformity or inflammation. Nasal mucosa are pink and moist without lesions or exudates. Mouth:  MMM. Left last molar has defect in tooth and mild erythema of the gum, no signs of  expanding to the jaw. Lungs:  Normal respiratory effort, chest expands symmetrically. Lungs are clear to auscultation, no crackles or wheezes. Heart:  Normal rate and regular rhythm.   Psych:  Oriented X3, memory intact for recent and remote, normally interactive, good eye contact, not anxious appearing, and dysphoric affect.     Impression & Recommendations:  Problem # 1:  ABSCESS, TOOTH (ICD-522.5) Assessment Deteriorated Rx Clinda. Rx Tylenol with codeine as needed for pain. Check BMP 2/2 Motrin overuse. Orders: [facility]- Est  Level 4 ([address])  Problem # 2:  ANXIETY (ICD-300.00) Assessment: Unchanged #20 usually lasts 6 months. Will refill. Her updated medication list for this problem includes:    Ativan 1 Mg Tabs (Lorazepam) .Marland Kitchen... Take 1/2 tablet by mouth in event of a panic attack    Citalopram Hydrobromide 40 Mg Tabs (Citalopram hydrobromide) .Marland Kitchen... 1/2 tab q am  Orders: [facility]- Est  Level 4 (60454)  Complete Medication List: 1)  Ativan 1 Mg Tabs (Lorazepam) .... Take 1/2 tablet by mouth in event of a panic attack 2)  Protonix 40 Mg Tbec (Pantoprazole sodium) .... Take 1 tablet by mouth twice a day 3)  Valtrex 1 Gm Tabs (Valacyclovir hcl) .... Take 1/2 tablet by mouth once a day 4)  Ventolin Hfa 108 (90 Base) Mcg/act Aers (Albuterol sulfate) .... 2-4 puffs every four hours as needed for wheezing 5)  Advair Diskus 250-50 Mcg/dose Misc (Fluticasone-salmeterol) .Marland Kitchen.. 1 puff 2 times daily 6)  Citalopram Hydrobromide 40 Mg Tabs (Citalopram hydrobromide) .... 1/2 tab q am 7)  Albuterol Sulfate (2.5 Mg/22ml) 0.083% Nebu (Albuterol sulfate) .Marland Kitchen.. 1 neb every 4 hours as needed cough, difficulty breathing 8)  Epipen 0.3 Mg/0.27ml Devi (Epinephrine) .... Use as directed 9)  Flonase 50 Mcg/act Susp (Fluticasone propionate) .... 2 sprays each nostril daily 10)  Ibuprofen 800 Mg Tabs (Ibuprofen) .... Take 1 tab by mouth three times a day for pain 11)  Clindamycin Hcl 150 Mg Caps (Clindamycin hcl) ....  Take 2 tabs by mouth three times a day for 10 days 12)  Tylenol With Codeine #3 300-30 Mg Tabs (Acetaminophen-codeine) .Marland Kitchen.. 1 by mouth q 4-6 hours as needed for pain  Patient Instructions: 1)  Nice to meet you 2)  We are working on getting an Transport planner for you. 3)  I am giving you a new medication called clindamycin.  4)  Follow-up in 1-2 weeks. Prescriptions: TYLENOL WITH CODEINE #3 300-30 MG TABS (ACETAMINOPHEN-CODEINE) 1 by mouth q 4-6 hours as needed for pain  #60 x 0   Entered and Authorized by:   Helane Rima DO   Signed  by:   Helane Rima DO on 10/27/2010   Method used:   Print then Give to Patient   RxID:   [id number] CLINDAMYCIN HCL 150 MG CAPS (CLINDAMYCIN HCL) take 2 tabs by mouth three times a day for 10 days  #60 x 0   Entered and Authorized by:   Helane Rima DO   Signed by:   Helane Rima DO on 10/27/2010   Method used:   Print then Give to Patient   RxID:   [id number] ATIVAN 1 MG TABS (LORAZEPAM) Take 1/2 tablet by mouth in event of a panic attack Brand medically necessary #20 x 0   Entered and Authorized by:   Helane Rima DO   Signed by:   Helane Rima DO on 10/27/2010   Method used:   Print then Give to Patient   RxID:   [id number] CLINDAMYCIN HCL 150 MG CAPS (CLINDAMYCIN HCL) take 2 tabs by mouth three times a day for 10 days  #60 x 0   Entered and Authorized by:   Helane Rima DO   Signed by:   Helane Rima DO on 10/27/2010   Method used:   Print then Give to Patient   RxID:   [id number] TYLENOL WITH CODEINE #3 300-30 MG TABS (ACETAMINOPHEN-CODEINE) 1 by mouth q 4-6 hours as needed for pain  #60 x 0   Entered and Authorized by:   Helane Rima DO   Signed by:   Helane Rima DO on 10/27/2010   Method used:   Print then Give to Patient   RxID:   [id number] ATIVAN 1 MG TABS (LORAZEPAM) Take 1/2 tablet by mouth in event of a panic attack Brand medically necessary #20 x 0   Entered and Authorized by:   Helane Rima DO    Signed by:   Helane Rima DO on 10/27/2010   Method used:   Print then Give to Patient   RxID:   [id number] CLINDAMYCIN HCL 150 MG CAPS (CLINDAMYCIN HCL) take 2 tabs by mouth three times a day for 10 days  #60 x 0   Entered and Authorized by:   Helane Rima DO   Signed by:   Helane Rima DO on 10/27/2010   Method used:   Print then Give to Patient   RxID:   [id number] ATIVAN 1 MG TABS (LORAZEPAM) Take 1/2 tablet by mouth in event of a panic attack Brand medically necessary #20 x 0   Entered and Authorized by:   Helane Rima DO   Signed by:   Helane Rima DO on 10/27/2010   Method used:   Print then Give to Patient   RxID:   [id number] TYLENOL WITH CODEINE #3 300-30 MG TABS (ACETAMINOPHEN-CODEINE) 1 by mouth q 4-6 hours as needed for pain  #60 x 0   Entered and Authorized by:   Helane Rima DO   Signed by:   Helane Rima DO on 10/27/2010   Method used:   Print then Give to Patient   RxID:   [id number]    Orders Added: 1)  [facility]- Est  Level 4 [29937]   NOTE: ONLY ONE RX GIVEN (PRINTER ISSUES). Helane Rima DO  October 27, 2010 10:38 AM

## 2010-12-26 NOTE — Progress Notes (Signed)
Summary: test results  Phone Note Call from Patient Call back at Home Phone [phone]   Caller: Patient Summary of Call: pt is wanting test results of her thyroid.  she is still having a hard time breathing.  wants to go to ED Initial call taken by: De Nurse,  June 06, 2009 10:31 AM  Follow-up for Phone Call        spoke with patient and she states she has difficuty breathing at times , she feels that when she turns her head a cetrain way she feels her air cut off. bothers her at nfght when she is asleep she wakes up and feels air has been cut off. has been occurring for a month . she feels it is due to increase of size of thyroid. reassured patient that report states no significant change in size from 2008. advised  will send  message to MD to please advise about details of report. suggested she schedule appointment to follow up with PCP. phone call was disconnected. tried to call back , no answer. will try again later. Follow-up by: Theresia Lo RN,  June 06, 2009 2:16 PM    spoke with patient and appointment scheduled for 06/09/2009. advised to call back if she feels she needs to be seen sooner. Theresia Lo RN  June 06, 2009 2:31 PM

## 2010-12-26 NOTE — Miscellaneous (Signed)
Summary: throat closing up per pt  Clinical Lists Changes states she has an appt here at 1:30 but cannot breathe well. her throat & face are swelling. told her to go to [facility] ED. states she just left [facility] ED because there was no parking. told her to call 911.Marland KitchenMarland KitchenGolden Circle RN  February 28, 2010 12:01 PM

## 2010-12-26 NOTE — Assessment & Plan Note (Signed)
Summary: f/u thyroid,df   Vital Signs:  Patient profile:   50 year old female Height:      63 inches Weight:      156 pounds BMI:     27.73 Temp:     97.6 degrees F oral Pulse rate:   79 / minute BP sitting:   114 / 78  (left arm) Cuff size:   regular  Vitals Entered By: Tessie Fass CMA (January 10, 2010 8:41 AM) CC: F/U thyroid Is Patient Diabetic? No Pain Assessment Patient in pain? yes     Location: throat Intensity: 5   Primary Care Provider:  Ardeen Garland  MD  CC:  F/U thyroid.  History of Present Illness: Complaining of difficulty swallowing, feels that her glands in her neck are always swollen.  She just quit smoking 2 days ago, she is wearing the nicotine patich.  She describes fibromyalagia pain that prevents her from doing things.  She would like to walk and exercises but instead she lays in bed with pain, mostly in an around her chest wall.    She has not had a period since November, is having hot flashes that are very uncomfortable from them.  She is not interested in having a boyfriend.    Habits & Providers  Alcohol-Tobacco-Diet     Tobacco Status: quit  Current Medications (verified): 1)  Ativan 1 Mg Tabs (Lorazepam) .... Take 1/2 Tablet By Mouth in Event of A Panic Attack 2)  Protonix 40 Mg Tbec (Pantoprazole Sodium) .... Take 1 Tablet By Mouth Twice A Day 3)  Valtrex 1 Gm Tabs (Valacyclovir Hcl) .... Take 1/2 Tablet By Mouth Once A Day 4)  Ventolin Hfa 108 (90 Base) Mcg/act  Aers (Albuterol Sulfate) .... 2-4 Puffs Every Four Hours As Needed For Wheezing 5)  Advair Diskus 250-50 Mcg/dose Misc (Fluticasone-Salmeterol) .Marland Kitchen.. 1 Puff 2 Times Daily 6)  Citalopram Hydrobromide 40 Mg Tabs (Citalopram Hydrobromide) .... 1/2 Tab Q Am  Allergies: 1)  Sulfamethoxazole (Sulfamethoxazole)  Social History: Smoking Status:  quit  Physical Exam  General:  Well appearing, looked tired Neck:  supple, full ROM, and no masses.   Lungs:  normal respiratory  effort and normal breath sounds.   Heart:  normal rate and regular rhythm.     Impression & Recommendations:  Problem # 1:  FIBROMYALGIA (ICD-729.1)  Trial of low dose SSRI, citalopram 20 mg 1/2 tab daily, if in 2 weeks she is feeling better and could stand a higher dose may increase to 20 mg.  She is somehwhat fearful of starting an antidepressant, as members of her family have bipolar disorder and have had problems with such.  Orders: [facility]- Est  Level 4 (16109)  Problem # 2:  TOBACCO DEPENDENCE (ICD-305.1)  Quit 2 days ago, was wearing the patch.  Will call next week to see how she is doing.  Gave a lot of + reinforcement for this.  Discussed staying away from freinds and family who smoke for a while.  Orders: [facility]- Est  Level 4 (99214)  Problem # 3:  GOITER, MULTINODULAR (ICD-241.1)  Do not beleive this is the reason she feels that she is chocking.  Consider 'globus hystericus' that may improve with SSRI  Orders: [facility]- Est  Level 4 (60454)  Problem # 4:  ANXIETY (ICD-300.00) Refilled lorazepam, may need more during the smoking cessation period. Her updated medication list for this problem includes:    Ativan 1 Mg Tabs (Lorazepam) .Marland Kitchen... Take 1/2 tablet by mouth  in event of a panic attack    Citalopram Hydrobromide 40 Mg Tabs (Citalopram hydrobromide) .Marland Kitchen... 1/2 tab q am  Orders: [facility]- Est  Level 4 ([address])  Complete Medication List: 1)  Ativan 1 Mg Tabs (Lorazepam) .... Take 1/2 tablet by mouth in event of a panic attack 2)  Protonix 40 Mg Tbec (Pantoprazole sodium) .... Take 1 tablet by mouth twice a day 3)  Valtrex 1 Gm Tabs (Valacyclovir hcl) .... Take 1/2 tablet by mouth once a day 4)  Ventolin Hfa 108 (90 Base) Mcg/act Aers (Albuterol sulfate) .... 2-4 puffs every four hours as needed for wheezing 5)  Advair Diskus 250-50 Mcg/dose Misc (Fluticasone-salmeterol) .Marland Kitchen.. 1 puff 2 times daily 6)  Citalopram Hydrobromide 40 Mg Tabs (Citalopram hydrobromide) .... 1/2 tab q  am  Patient Instructions: 1)  Please schedule a follow-up appointment in 1 month with [place] or [facility].  2)  Think about being healthy 3)  Continue to stick with plan to quit smoking 4)  may increase citalopram to one tab in 2 weeks. Prescriptions: ATIVAN 1 MG TABS (LORAZEPAM) Take 1/2 tablet by mouth in event of a panic attack Brand medically necessary #20 x 0   Entered and Authorized by:   Luretha Murphy NP   Signed by:   Luretha Murphy NP on 01/10/2010   Method used:   Print then Give to Patient   RxID:   [id number] CITALOPRAM HYDROBROMIDE 40 MG TABS (CITALOPRAM HYDROBROMIDE) 1/2 tab q am Brand medically necessary #30 x 3   Entered and Authorized by:   Luretha Murphy NP   Signed by:   Luretha Murphy NP on 01/10/2010   Method used:   Print then Give to Patient   RxID:   [id number]
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
## 2010-12-26 NOTE — Assessment & Plan Note (Signed)
Summary: enlarging lesion/[place]/[facility]   Vital Signs:  Patient profile:   50 year old female Weight:      159 pounds Temp:     98.1 degrees F oral Pulse rate:   86 / minute BP sitting:   123 / 74  (right arm)  Vitals Entered By: Renato Battles slade,cma CC: infection on chin x 2 days Is Patient Diabetic? No Pain Assessment Patient in pain? yes     Location: chin Intensity: 6 Onset of pain  x 2 days   Primary Care Provider:  Ardeen Garland  MD  CC:  infection on chin x 2 days.  History of Present Illness: PIcked at a spot on her face and woke up this AM with draining pus and reddness and pain.  Felt that she may have spiked a fever.  Has a history of MRSA  Habits & Providers  Alcohol-Tobacco-Diet     Tobacco Status: current     Tobacco Counseling: to quit use of tobacco products  Current Medications (verified): 1)  Ativan 1 Mg Tabs (Lorazepam) .... Take 1/2 Tablet By Mouth in Event of A Panic Attack 2)  Protonix 40 Mg Tbec (Pantoprazole Sodium) .... Take 1 Tablet By Mouth Twice A Day 3)  Valtrex 1 Gm Tabs (Valacyclovir Hcl) .... Take 1/2 Tablet By Mouth Once A Day 4)  Ventolin Hfa 108 (90 Base) Mcg/act  Aers (Albuterol Sulfate) .... 2-4 Puffs Every Four Hours As Needed For Wheezing 5)  Advair Diskus 250-50 Mcg/dose Misc (Fluticasone-Salmeterol) .Marland Kitchen.. 1 Puff 2 Times Daily 6)  Citalopram Hydrobromide 40 Mg Tabs (Citalopram Hydrobromide) .... 1/2 Tab Q Am 7)  Doxycycline Hyclate 100 Mg Tabs (Doxycycline Hyclate) .... One Tab Two Times A Day For 10 Days  Allergies (verified): 1)  Sulfamethoxazole (Sulfamethoxazole)  Social History: Smoking Status:  current  Review of Systems      See HPI  Physical Exam  General:  Obivious lision on face Skin:  Honey crusted, weeping 1.5 cm lesion on chin wtih surrounding errythema and submandibular adenopathy.   Impression & Recommendations:  Problem # 1:  INFECTION, SKIN AND SOFT TISSUE (ICD-686.9)  Doxycycline (sulfa allergy) two times  a day for 10 days, keep covered with antibiotic ointment, good handwashing, quit picking at face  Orders: [facility]- Est Level  3 (08657)  Complete Medication List: 1)  Ativan 1 Mg Tabs (Lorazepam) .... Take 1/2 tablet by mouth in event of a panic attack 2)  Protonix 40 Mg Tbec (Pantoprazole sodium) .... Take 1 tablet by mouth twice a day 3)  Valtrex 1 Gm Tabs (Valacyclovir hcl) .... Take 1/2 tablet by mouth once a day 4)  Ventolin Hfa 108 (90 Base) Mcg/act Aers (Albuterol sulfate) .... 2-4 puffs every four hours as needed for wheezing 5)  Advair Diskus 250-50 Mcg/dose Misc (Fluticasone-salmeterol) .Marland Kitchen.. 1 puff 2 times daily 6)  Citalopram Hydrobromide 40 Mg Tabs (Citalopram hydrobromide) .... 1/2 tab q am 7)  Doxycycline Hyclate 100 Mg Tabs (Doxycycline hyclate) .... One tab two times a day for 10 days  Patient Instructions: 1)  Take all of antibiotics, take with food 2)  Use antibiotic oitment and keep covered 3)  Quit picking at face Prescriptions: DOXYCYCLINE HYCLATE 100 MG TABS (DOXYCYCLINE HYCLATE) one tab two times a day for 10 days Brand medically necessary #20 x 0   Entered and Authorized by:   Luretha Murphy NP   Signed by:   Luretha Murphy NP on 02/17/2010   Method used:   Print then Give  to Patient   RxID:   [id number]

## 2010-12-26 NOTE — Progress Notes (Signed)
Summary: Triage  Phone Note Call from Patient Call back at Home Phone [phone]   Reason for Call: Talk to Nurse Summary of Call: pt is still sick, was seen recently for bronchitis, still has mucus and wheezing Initial call taken by: Knox Royalty,  Mar 30, 2010 12:26 PM  Follow-up for Phone Call        she sounded bad on the phone. coughing. offered work in but she cannot make it due to picking up her dtr. she took 8:30am tomorrow. she also declined UC today. to keep taking her meds & drink plenty of water Follow-up by: Golden Circle RN,  Mar 30, 2010 12:27 PM
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
## 2010-12-26 NOTE — Assessment & Plan Note (Signed)
Summary: F/U BRONCHITIS/[facility]   Vital Signs:  Patient profile:   51 year old female Height:      63 inches Weight:      163 pounds Temp:     97.9 degrees F oral Pulse rate:   77 / minute BP sitting:   126 / 88  (right arm) Cuff size:   regular  Vitals Entered By: [place] More[place], SMA CC: F/U Bronchitis Pain Assessment Patient in pain? no        Primary Care Provider:  Ardeen Garland  MD  CC:  F/U Bronchitis.  History of Present Illness: CC: f/u bronchitis  5 d h/o coughing fits and SOB.  Started with clogged sinuses.  Seen yesterday and dx with bronchitis (?COPD exac), given doxycycline, prednisone, albuterol nebs, tussionex and mucinex.  Doing tremendously better, still having sore chest muscles but no pain.  Did quit smoking 6 wks ago.  Does has smokers around her - son, boyfriend, etc.  Has had bronchitis/asthma in past.  Last PFTs were < 1 year ago, told had asthma.  No fevers.  last albuterol neb was at 4am today.  Habits & Providers  Alcohol-Tobacco-Diet     Tobacco Status: quit  Allergies: 1)  ! * Shellfish 2)  Sulfamethoxazole (Sulfamethoxazole)  Physical Exam  General:  no resp distress, NAD, talks in complete sentences, no coughing during exam or interview Lungs:  still tight air movement, no crackles/wheezing, able to talk in complete sentences Heart:  normal rate and regular rhythm.   Extremities:  no edema   Impression & Recommendations:  Problem # 1:  ACUTE BRONCHITIS (ICD-466.0)  much improved.  still tight.  advised to use albuterol Q4 hours for next several days.  continue other meds.  pt declines breathing treatment in office.  red flags to seek urgent medical care over long weekend discussed.  Her updated medication list for this problem includes:    Ventolin Hfa 108 (90 Base) Mcg/act Aers (Albuterol sulfate) .Marland Kitchen... 2-4 puffs every four hours as needed for wheezing    Advair Diskus 250-50 Mcg/dose Misc (Fluticasone-salmeterol) .Marland Kitchen... 1 puff 2 times  daily    Doxycycline Hyclate 100 Mg Caps (Doxycycline hyclate) .Marland Kitchen... 1 by mouth two times a day x 7 days    Mucinex Dm 30-600 Mg Xr12h-tab (Dextromethorphan-guaifenesin) .Marland Kitchen... 1 by mouth two times a day as needed cough    Albuterol Sulfate (2.5 Mg/26ml) 0.083% Nebu (Albuterol sulfate) .Marland Kitchen... 1 neb every 4 hours as needed cough, difficulty breathing    Tussionex Pennkinetic Er 8-10 Mg/15ml Lqcr (Chlorpheniramine-hydrocodone) .Marland Kitchen... 1 teaspoon two times a day as needed cough 30 day supply  Orders: [facility]- Est Level  3 (04540)  Complete Medication List: 1)  Ativan 1 Mg Tabs (Lorazepam) .... Take 1/2 tablet by mouth in event of a panic attack 2)  Protonix 40 Mg Tbec (Pantoprazole sodium) .... Take 1 tablet by mouth twice a day 3)  Valtrex 1 Gm Tabs (Valacyclovir hcl) .... Take 1/2 tablet by mouth once a day 4)  Ventolin Hfa 108 (90 Base) Mcg/act Aers (Albuterol sulfate) .... 2-4 puffs every four hours as needed for wheezing 5)  Advair Diskus 250-50 Mcg/dose Misc (Fluticasone-salmeterol) .Marland Kitchen.. 1 puff 2 times daily 6)  Citalopram Hydrobromide 40 Mg Tabs (Citalopram hydrobromide) .... 1/2 tab q am 7)  Prednisone 20 Mg Tabs (Prednisone) .... Take 3 tabs daily for 5 days 8)  Doxycycline Hyclate 100 Mg Caps (Doxycycline hyclate) .Marland Kitchen.. 1 by mouth two times a day x 7 days  9)  Mucinex Dm 30-600 Mg Xr12h-tab (Dextromethorphan-guaifenesin) .Marland Kitchen.. 1 by mouth two times a day as needed cough 10)  Albuterol Sulfate (2.5 Mg/48ml) 0.083% Nebu (Albuterol sulfate) .Marland Kitchen.. 1 neb every 4 hours as needed cough, difficulty breathing 11)  Tussionex Pennkinetic Er 8-10 Mg/81ml Lqcr (Chlorpheniramine-hydrocodone) .Marland Kitchen.. 1 teaspoon two times a day as needed cough 30 day supply  Other Orders: Pulse Oximetry- [facility] (16109)  Patient Instructions: 1)  You're sounding much better today.  Keep doing what you're doing.  Keep taking steroids and cough medicine.  This should slowly keep improving.  2)  If fevers, or productive cough, or not  improving as expected or Shortness of breath worse, please seek urgent medical care. 3)  Call us with questions - we have a physician on call 24 hours here.  Appended Document: F/U BRONCHITIS/[facility]    Clinical Lists Changes  Medications: Added new medication of EPIPEN 0.3 MG/0.3ML DEVI (EPINEPHRINE) use as directed - Signed Rx of EPIPEN 0.3 MG/0.3ML DEVI (EPINEPHRINE) use as directed;  #1 x 2;  Signed;  Entered by: Eustaquio Boyden  MD;  Authorized by: Eustaquio Boyden  MD;  Method used: Electronically to [id number] [address], [place], [place]  [address], Ph: [phone] or [phone], Fax: [phone]    Prescriptions: EPIPEN 0.3 MG/0.3ML DEVI (EPINEPHRINE) use as directed  #1 x 2   Entered and Authorized by:   Eustaquio Boyden  MD   Signed by:   Eustaquio Boyden  MD on 03/16/2010   Method used:   Electronically to        [id number] (retail)       [address]       [place], [place]  [address]       Ph: [phone] or [phone]       Fax: [phone]   RxID:   [id number]

## 2010-12-26 NOTE — Assessment & Plan Note (Signed)
Summary: productive cough & SOB/[place]/mayans   Vital Signs:  Patient profile:   50 year old female Weight:      162.4 pounds O2 Sat:      98 % on Room air Temp:     97.7 degrees F oral Pulse rate:   94 / minute BP sitting:   118 / 83  (left arm)  Vitals Entered By: Arlyss Repress CMA, (March 15, 2010 10:10 AM)  O2 Flow:  Room air CC: Cough, SOB Is Patient Diabetic? No Pain Assessment Patient in pain? no        Primary Care Provider:  Ardeen Garland  MD  CC:  Cough and SOB.  History of Present Illness:   50 y.o. female history of tobacco abuse, quit 6 weeks ago using nicotine patches. Presents with 3 days of cough and SOB. Pt states cough is so bad it keeps her up at nigtht, productive of thick yellow sputum, occasional post-tussive emesis of yellow sputum. Denies fever, admits to chest pain with coughing epsiodes located in left and right sides and in back. No recent sick contacts. Note did smoke 2 cig yesterday to see if it would help the cough. Cough causes her to become weak and dizzy at times. Using advair as prescribed, states using albuterol every 4 hours but it doesnt help, feels nebulizers helped a few years ago   ROS- no diarrhea, rash, fever, admits to decreased by mouth intake  Habits & Providers  Alcohol-Tobacco-Diet     Tobacco Status: quit  Comments: x 6 weeks  Current Medications (verified): 1)  Ativan 1 Mg Tabs (Lorazepam) .... Take 1/2 Tablet By Mouth in Event of A Panic Attack 2)  Protonix 40 Mg Tbec (Pantoprazole Sodium) .... Take 1 Tablet By Mouth Twice A Day 3)  Valtrex 1 Gm Tabs (Valacyclovir Hcl) .... Take 1/2 Tablet By Mouth Once A Day 4)  Ventolin Hfa 108 (90 Base) Mcg/act  Aers (Albuterol Sulfate) .... 2-4 Puffs Every Four Hours As Needed For Wheezing 5)  Advair Diskus 250-50 Mcg/dose Misc (Fluticasone-Salmeterol) .Marland Kitchen.. 1 Puff 2 Times Daily 6)  Citalopram Hydrobromide 40 Mg Tabs (Citalopram Hydrobromide) .... 1/2 Tab Q Am 7)  Prednisone 20 Mg Tabs  (Prednisone) .... Take 3 Tabs Daily For 5 Days 8)  Doxycycline Hyclate 100 Mg Caps (Doxycycline Hyclate) .Marland Kitchen.. 1 By Mouth Two Times A Day X 7 Days 9)  Mucinex Dm 30-600 Mg Xr12h-Tab (Dextromethorphan-Guaifenesin) .Marland Kitchen.. 1 By Mouth Two Times A Day As Needed Cough 10)  Albuterol Sulfate (2.5 Mg/74ml) 0.083% Nebu (Albuterol Sulfate) .Marland Kitchen.. 1 Neb Every 4 Hours As Needed Cough, Difficulty Breathing 11)  Tussionex Pennkinetic Er 8-10 Mg/64ml Lqcr (Chlorpheniramine-Hydrocodone) .Marland Kitchen.. 1 Teaspoon Two Times A Day As Needed Cough 30 Day Supply  Allergies (verified): 1)  Sulfamethoxazole (Sulfamethoxazole)  Social History: Smoking Status:  quit  Review of Systems       Per HPI  Physical Exam  General:  Mild distress with coughing epsisodes RR 25 Flushed with coughing episodes Vital signs noted oxygen sat 98% RA Eyes:  no conjunctival injection Mouth:  MMM Lungs:  scattered wheeze, no crackles, increased WOB, no retractions, harsh cough productive sputum, good airmovement    s/p neb treatment- no wheeze, good airmovement, continued cough spells Heart:  normal rate and regular rhythm.     Impression & Recommendations:  Problem # 1:  ACUTE BRONCHITIS (ICD-466.0) Assessment New  Will treat as acute bronchitis in a smoker with change in sputum, likely COPD but no diagnosis  in history. Steroid burst, antiotics and mucinex. RTC tomorrow for follow-up before the weekned. Pt not hypoxic and improved s/p neb, therefore will not admit. Pt given red flags to seek ER attention.   The following medications were removed from the medication list:    Doxycycline Hyclate 100 Mg Tabs (Doxycycline hyclate) ..... One tab two times a day for 10 days Her updated medication list for this problem includes:    Ventolin Hfa 108 (90 Base) Mcg/act Aers (Albuterol sulfate) .Marland Kitchen... 2-4 puffs every four hours as needed for wheezing    Advair Diskus 250-50 Mcg/dose Misc (Fluticasone-salmeterol) .Marland Kitchen... 1 puff 2 times daily     Doxycycline Hyclate 100 Mg Caps (Doxycycline hyclate) .Marland Kitchen... 1 by mouth two times a day x 7 days    Mucinex Dm 30-600 Mg Xr12h-tab (Dextromethorphan-guaifenesin) .Marland Kitchen... 1 by mouth two times a day as needed cough    Albuterol Sulfate (2.5 Mg/66ml) 0.083% Nebu (Albuterol sulfate) .Marland Kitchen... 1 neb every 4 hours as needed cough, difficulty breathing    Tussionex Pennkinetic Er 8-10 Mg/41ml Lqcr (Chlorpheniramine-hydrocodone) .Marland Kitchen... 1 teaspoon two times a day as needed cough 30 day supply  Orders: [facility]- Est Level  3 (16109)  Complete Medication List: 1)  Ativan 1 Mg Tabs (Lorazepam) .... Take 1/2 tablet by mouth in event of a panic attack 2)  Protonix 40 Mg Tbec (Pantoprazole sodium) .... Take 1 tablet by mouth twice a day 3)  Valtrex 1 Gm Tabs (Valacyclovir hcl) .... Take 1/2 tablet by mouth once a day 4)  Ventolin Hfa 108 (90 Base) Mcg/act Aers (Albuterol sulfate) .... 2-4 puffs every four hours as needed for wheezing 5)  Advair Diskus 250-50 Mcg/dose Misc (Fluticasone-salmeterol) .Marland Kitchen.. 1 puff 2 times daily 6)  Citalopram Hydrobromide 40 Mg Tabs (Citalopram hydrobromide) .... 1/2 tab q am 7)  Prednisone 20 Mg Tabs (Prednisone) .... Take 3 tabs daily for 5 days 8)  Doxycycline Hyclate 100 Mg Caps (Doxycycline hyclate) .Marland Kitchen.. 1 by mouth two times a day x 7 days 9)  Mucinex Dm 30-600 Mg Xr12h-tab (Dextromethorphan-guaifenesin) .Marland Kitchen.. 1 by mouth two times a day as needed cough 10)  Albuterol Sulfate (2.5 Mg/43ml) 0.083% Nebu (Albuterol sulfate) .Marland Kitchen.. 1 neb every 4 hours as needed cough, difficulty breathing 11)  Tussionex Pennkinetic Er 8-10 Mg/42ml Lqcr (Chlorpheniramine-hydrocodone) .Marland Kitchen.. 1 teaspoon two times a day as needed cough 30 day supply  Other Orders: Pulse Oximetry- [facility] (94760) Albuterol Sulfate Sol 1mg  unit dose (U0454) Atrovent 1mg  (Neb) [phone])  Patient Instructions: 1)  I am treating you for bronchitis 2)  Use the nebulizer every 4 hours as needed 3)  Continue your advair 4)  Take the steroids and  antibiotics 5)  The mucinex will help break up the mucous 6)  The tussionex is the cough syrup 7)  Come back to be seen on Friday to make sure you are okay  8)  If you have difficulty breathing, chest pain or high fever go to the nearest ER Prescriptions: TUSSIONEX PENNKINETIC ER 8-10 MG/5ML LQCR (CHLORPHENIRAMINE-HYDROCODONE) 1 teaspoon two times a day as needed cough 30 day supply  #1 x 0   Entered and Authorized by:   Milinda Antis MD   Signed by:   Milinda Antis MD on 03/15/2010   Method used:   Handwritten   RxID:   [id number] ALBUTEROL SULFATE (2.5 MG/3ML) 0.083% NEBU (ALBUTEROL SULFATE) 1 neb every 4 hours as needed cough, difficulty breathing  #30 x 1   Entered and Authorized by:   Kingsley Spittle  [place] MD   Signed by:   Milinda Antis MD on 03/15/2010   Method used:   Print then Give to Patient   RxID:   [id number] MUCINEX DM 30-600 MG XR12H-TAB (DEXTROMETHORPHAN-GUAIFENESIN) 1 by mouth two times a day as needed cough  #60 x 2   Entered and Authorized by:   Milinda Antis MD   Signed by:   Milinda Antis MD on 03/15/2010   Method used:   Print then Give to Patient   RxID:   [id number] DOXYCYCLINE HYCLATE 100 MG CAPS (DOXYCYCLINE HYCLATE) 1 by mouth two times a day x 7 days  #14 x 0   Entered and Authorized by:   Milinda Antis MD   Signed by:   Milinda Antis MD on 03/15/2010   Method used:   Print then Give to Patient   RxID:   [id number] PREDNISONE 20 MG TABS (PREDNISONE) Take 3 tabs daily for 5 days  #15 x 0   Entered and Authorized by:   Milinda Antis MD   Signed by:   Milinda Antis MD on 03/15/2010   Method used:   Print then Give to Patient   RxID:   [id number]    Medication Administration  Medication # 1:    Medication: Albuterol Sulfate Sol 1mg  unit dose    Diagnosis: SHORTNESS OF BREATH (ICD-786.05)    Dose: 2.5mg     Route: inhaled    Exp Date: 07/2011    Lot #: [id number]    Mfr: nephron    Comments: 2.5mg /46ml given    Patient  tolerated medication without complications    Given by: Tessie Fass CMA (March 15, 2010 11:06 AM)  Medication # 2:    Medication: Atrovent 1mg  (Neb)    Diagnosis: SHORTNESS OF BREATH (ICD-786.05)    Dose: 0.5mg     Route: inhaled    Exp Date: 06/2011    Lot #: [id number]    Mfr: nephron    Comments: 0.5mg /2.55ml    Patient tolerated medication without complications    Given by: Tessie Fass CMA (March 15, 2010 11:07 AM)  Orders Added: 1)  Pulse Oximetry- [facility] [94760] 2)  Albuterol Sulfate Sol 1mg  unit dose [J7613] 3)  Atrovent 1mg  (Neb) [P3295] 4)  [facility]- Est Level  3 [18841]

## 2010-12-26 NOTE — Progress Notes (Signed)
Summary: Triage  Phone Note Call from Patient Call back at [phone]   Summary of Call: Pt is requesting to speak with someone about her thyroids, states she is having problems.   Initial call taken by: Haydee Salter,  January 07, 2008 10:30 AM  Follow-up for Phone Call        has been told she has nodules on her thyroid. states her thoat contracts when her thyroid swells up. has pain in her throat. wants to go to a specialist for this. "constantly uncomfortable" "ready to stop coming here" " he keeps wanting to put me on antidepressants which I don't need" she was very upset and convinced  this was going to turn into cancer. states she can only eat soft foods. concerned about cyst on neck as well. told her last thyroid level was WNL.  Has barium swallow scheduled for tomorrow. Wants change of pcp to a female doctor. told her I would forward that request. Follow-up by: Golden Circle RN,  January 07, 2008 10:42 AM  Additional Follow-up for Phone Call Additional follow up Details #1::        Pt has a gastroneterologist which I have asked her to go see.  In pursuit of the dysphagia, I ordered a modified barium swallow, which she didn't get.  She wants ativan for anxiety, which I won't give her.  If she still has this complaint, please find out why she hasn't had the barium swallow or gone to see her gastroenterologist? Additional Follow-up by: Towana Badger MD,  January 07, 2008 1:33 PM    Additional Follow-up for Phone Call Additional follow up Details #2::    states she owes 3k to GI md and hiatal hernia surgery is 7k which she cannot afford. So she will not be going to GI md. would like a referral to "thyroid doctor" states she will use her tax refund to pay for it. states there is one on [address] c/o that her neck is noticably large. offered appt. she declined Follow-up by: Golden Circle RN,  January 07, 2008 2:37 PM  Additional Follow-up for Phone Call Additional follow up Details  #3:: Details for Additional Follow-up Action Taken: Referred to endocrinology.  However, she isn't keeping up with her regular appointments with me to work this up.  She doesn't need to be spending the money on a specialist at this point. Additional Follow-up by: Towana Badger MD,  January 07, 2008 2:43 PM

## 2010-12-26 NOTE — Assessment & Plan Note (Signed)
Summary: dnka      Current Allergies: SULFAMETHOXAZOLE (SULFAMETHOXAZOLE)        Complete Medication List: 1)  Ativan 1 Mg Tabs (Lorazepam) .... Take 1/2 tablet by mouth in event of a panic attack 2)  Protonix 40 Mg Tbec (Pantoprazole sodium) .... Take 1 tablet by mouth twice a day 3)  Valtrex 1 Gm Tabs (Valacyclovir hcl) .... Take 1/2 tablet by mouth once a day 4)  Ventolin Hfa 108 (90 Base) Mcg/act Aers (Albuterol sulfate) .... 2-4 puffs every four hours as needed for wheezing 5)  Fluticasone Propionate 50 Mcg/act Susp (Fluticasone propionate) .... 2 sprays each nostril once daily 6)  Advair Diskus 250-50 Mcg/dose Misc (Fluticasone-salmeterol) .Marland Kitchen.. 1 puff 2 times daily    ]

## 2010-12-26 NOTE — Progress Notes (Signed)
  Phone Note Call from Patient   Caller: Patient Call For: [phone] Summary of Call: Patient had tooth pulled today and was given novacaine for pain.  Patient was never put to sleep, but when she got home and ate something she started vomitting after.  Tried drinking water and threw that up.  Called the dentist office and was told that the meds given shouldn' t have caused that rx.  Wanted to know if she needed to be seen for this  Follow-up for Phone Call        patient states she was given Clindomycin today follow a tooth extraction . she took medication then an hour later started with vomiting. she has vomited several times. she took clindomycin in the past and experienced nausea and generally felt bad. advised patient to call dentist office back and let then know this and see if they will switch antibiotic. Theresia Lo RN  November 03, 2010 3:41 PM  Follow-up by: Theresia Lo RN,  November 03, 2010 3:41 PM  Additional Follow-up for Phone Call Additional follow up Details #1::        spoke with patient again and she has spoken with dentist offic and they are changing medication to penicillin. advised her to go slow with foods, start with very bland type food, crackers, dry toast. push liquids. Additional Follow-up by: Theresia Lo RN,  November 03, 2010 4:47 PM

## 2010-12-26 NOTE — Progress Notes (Signed)
Summary: requesting rx  Phone Note Call from Patient Call back at Home Phone [phone]   Reason for Call: Talk to Nurse Summary of Call: pt is requesting a refill on ativan, sts she last had them filled in feb. and only got 10 pills, she only uses them when she gets into a panic. [facility] to [facility] Initial call taken by: Knox Royalty,  Apr 08, 2008 4:16 PM  Follow-up for Phone Call        will send message to Dr.Alianna Wurster. Follow-up by: Theresia Lo RN,  Apr 08, 2008 4:39 PM  Additional Follow-up for Phone Call Additional follow up Details #1::        Checking status.  Would like to know before we close for the weekend. Additional Follow-up by: Haydee Salter,  Apr 09, 2008 3:59 PM    Additional Follow-up for Phone Call Additional follow up Details #2::    Will write script and put it up front.  Ativan cannot be prescribed elctronically.  I am in clinic this afternoon.  Will have script at front desk by 2pm. Follow-up by: Ardeen Garland  Additional Follow-up for Phone Call Additional follow up Details #3:: Details for Additional Follow-up Action Taken: informed of the above Additional Follow-up by: Haydee Salter,  Apr 13, 2008 11:50 AM

## 2010-12-26 NOTE — Assessment & Plan Note (Signed)
Summary: PFT/spirometry/Smoking Cessation - Rx Clinic   Vital Signs:  Patient Profile:   50 Years Old Female Height:     63 inches Weight:      167 pounds BMI:     29.69 Pulse rate:   79 / minute BP sitting:   132 / 80  (left arm)                  PCP:  Ardeen Garland  MD  Chief Complaint:  Worried she has COPD after ER visit.  History of Present Illness: Never purchased Chantix due to cost.   She is having surgery for hiatal hernia repair on 7/23 and is moving to a new residence in early August.     Current Allergies: SULFAMETHOXAZOLE (SULFAMETHOXAZOLE)    Risk Factors:  Tobacco use:  current    Year started:  1985      Impression & Recommendations:  Problem # 1:  ? of CHRONIC OBSTRUCTIVE PULMONARY DISEASE, ACUTE (ICD-496) Assessment: Unchanged PFT performed and found to have "normal PFT" with no significant bronchodilatory response.   Continue current Advair/flonase and as needed albuterol regimen at this time.   Focus on complete smoking cessation and long term success from smoking.  Her updated medication list for this problem includes:    Ventolin Hfa 108 (90 Base) Mcg/act Aers (Albuterol sulfate) .Marland Kitchen... 2-4 puffs every four hours as needed for wheezing    Advair Diskus 250-50 Mcg/dose Misc (Fluticasone-salmeterol) .Marland Kitchen... 1 puff 2 times daily  Orders: Albuterol Sulfate Sol 1mg  unit dose (W0981) PFT Baseline-Pre/Post Bronchodiolator (PFT Baseline-Pre/Pos)   Problem # 2:  TOBACCO DEPENDENCE (ICD-305.1) Assessment: Improved Now smoking only 4 cigarettes per day.  Plans NEW quit date of 7/16 (same day as day of meeting new primary MD - Dr. Georgiana Shore).  She has hernia repair surgery on 7/23.  She appears to be motivated to attempt smoking cessation however her anxiety and stressful lifestyle appear to be major barriers to her success.   We discussed current plan and I am willing to work with her if she is unable to afford Chanitx and would like to try this as part  of a future quit plan.  The following medications were removed from the medication list:    Chantix Starting Month Pak 0.5 Mg X 11 & 1 Mg X 42 Misc (Varenicline tartrate) ..... Use as directed  Orders: PFT Baseline-Pre/Post Bronchodiolator (PFT Baseline-Pre/Pos)   Complete Medication List: 1)  Ativan 1 Mg Tabs (Lorazepam) .... Take 1/2 tablet by mouth in event of a panic attack 2)  Protonix 40 Mg Tbec (Pantoprazole sodium) .... Take 1 tablet by mouth twice a day 3)  Valtrex 1 Gm Tabs (Valacyclovir hcl) .... Take 1/2 tablet by mouth once a day 4)  Ventolin Hfa 108 (90 Base) Mcg/act Aers (Albuterol sulfate) .... 2-4 puffs every four hours as needed for wheezing 5)  Fluticasone Propionate 50 Mcg/act Susp (Fluticasone propionate) .... 2 sprays each nostril once daily 6)  Advair Diskus 250-50 Mcg/dose Misc (Fluticasone-salmeterol) .Marland Kitchen.. 1 puff 2 times daily    ]  Medication Administration  Medication # 1:    Medication: Albuterol Sulfate Sol 1mg  unit dose    Diagnosis: ? of CHRONIC OBSTRUCTIVE PULMONARY DISEASE, ACUTE (ICD-496)    Dose: 2.5mg /63ml    Route: inhaled    Exp Date: 12/27/2009    Lot #: [id number]    Mfr: nephron    Patient tolerated medication without complications    Given by: JESSICA FLEEGER CMA (  June 08, 2008 9:11 AM)  Orders Added: 1)  Albuterol Sulfate Sol 1mg  unit dose [J7613] 2)  PFT Baseline-Pre/Post Bronchodiolator [PFT Baseline-Pre/Pos]    Pulmonary Function Test Date: 06/08/2008 Height (in.): 63 Gender: Female  Pre-Spirometry FVC    Value: 2.60 L/min   Pred: 3.17 L/min     % Pred: 82 % FEV1    Value: 1.90 L     Pred: 2.65 L     % Pred: 71 % FEV1/FVC  Value: 73 %     Pred: 84 %     % Pred: 87 % FEF 25-75  Value: 1.34 L/min   Pred: 3.04 L/min     % Pred: 44 %  Post-Spirometry FVC    Value: 2.59 L/min   Pred: 3.17 L/min     % Pred: 81 % FEV1    Value: 1.91 L     Pred: 2.65 L     % Pred: 72 % FEV1/FVC  Value: 74 %     Pred: 84 %     % Pred: 88 % FEF  25-75  Value: 1.35 L/min   Pred: 3.04 L/min     % Pred: 44 %  Comments: COPD Risk = 19% - If stops smoking = 13% Lung Age Calculated to be 73 yrs  Evaluation: near normal - No significant bronchodilator response  Recommendations: continue current respiratory regimen at this time.  Focus on smoking Cessation   Tobacco Counseling   Currently uses tobacco.    Cigarettes      Year started smoking cigarettes:      1985     Number of packs per day smoked:      .5     Years smoked:            24     Packs per year:          182.50     Pack Years:            12   Readiness to Quit:     Somewhat Ready Cessation Stage:     Action Target Quit Date:     06/10/2008  Quitting Barriers:      -  anxiety     -  fear of weight gain  Quitting Motivators:      -  poor health     -  dyspnea     -  cost     -  other  Comments: Motivated to quit prior to hernia surgery next week.   Previous Quit Attempts   Previously Tried to quit:     Yes Longest Successful Quit Period:   3 months  Quit Methods Tried:      -  cold [place]     -  nicotine patch     -  nicotine gum     -  hypnosis  Reason for restarting:      -  stress     -  anxiety     -  feels addicted  Smoking Cessation Plan   Readiness:     Somewhat Ready Cessation Stage:   Action Target Quit Date:   06/10/2008  Counseled on:      -  routine/lifestyle  change     -  exercise     -  nicotine withdrawal symptoms  Comments: Unable to afford Chantix due to cost.   Orders Added this Update:  Albuterol Sulfate Sol 1mg  unit dose [U0454] PFT Baseline-Pre/Post Bronchodiolator [  PFT Baseline-Pre/Pos]

## 2010-12-26 NOTE — Assessment & Plan Note (Signed)
Summary: fu on test results wk   Vital Signs:  Patient Profile:   50 Years Old Female Height:     63 inches Weight:      158.6 pounds BMI:     28.20 Temp:     98.5 degrees F Pulse rate:   74 / minute BP sitting:   139 / 87  (left arm)  Pt. in pain?   no  Vitals Entered By: Manon Hilding Duncan Dull)                Chief Complaint:  F/U TEST RESULTS.  History of Present Illness: Pt notes sedation and fatigue with Buspar.  She would like to go back to the Ativan.  She notes intermittent use of this medication, and doesn't need alot.  She doesn't have a history of abuse of this medication.  To boot, she's not had any medications for 60 days.  She'd like something just once in a while for the anxiety.  She has some brief attacks, work-related, where she has tachypnea and panic. The ativan carried her through it.  Without medications for 60 days, she has tried to work without it.  She lost her medicaid, and cannot afford a therapist.  FU Hiatal hernia - Pt is to have surgery for this.  She does not recall having an H. Pylori test, but she has had an EGD with diagnosis of this.  She is on protonix and needs a refill.  FU Herpes labialis - She needs a refill of the 500mg  valtrex daily.  She's been taking this since 1983.  She has had some recent outbreaks and still needs this medication.  FU Goiter - Pt notes some weight gain, thinks this is due to the Buspar.  She's not been having trouble with temperature changes, etc.   Her last TSH was stable.    Depression History:      The patient presents with symptoms of depression which have been present for greater than two weeks.  The patient denies a depressed mood most of the day and a diminished interest in her usual daily activities.  The patient denies significant weight loss, significant weight gain, insomnia, hypersomnia, psychomotor agitation, psychomotor retardation, fatigue (loss of energy), feelings of worthlessness (guilt),  impaired concentration (indecisiveness), and recurrent thoughts of death or suicide.  The patient denies symptoms of a manic disorder including persistently & abnormally elevated mood, abnormally & persistently irritable mood, less need for sleep, talkative or feels need to keep talking, distractibility, flight of ideas, increase in goal-directed activity, psychomotor agitation, inflated self-esteem or grandiosity, excessive buying sprees, excessive sexual indiscretions, and excessive foolish business investments.        Suicide risk questions reveal that she wishes that she were dead and she has thought about ending her life.  The patient denies that she feels like life is not worth living.  Her current symptoms often keep her from doing the things she needs to do.         Dyspepsia History:      The patient has positive alarm features of dyspepsia which include dysphagia.  There is a prior history of GERD.  She notes that it has been less than 12 months since the last episode of GERD and that there have been breakthrough symptoms despite maximum H-2 blocker or PPI therapy.  The patient does not have a prior history of documented ulcer disease.  The dominant symptom is heartburn or acid reflux.  An H-2 blocker medication  is not currently being taken.  A prior EGD has been done which showed no evidence for moderate or severe esophagitis or Barrett's esophagus.            Physical Exam  General:     Well-developed,well-nourished,in no acute distress; alert,appropriate and cooperative throughout examination Lungs:     Normal respiratory effort, chest expands symmetrically. Lungs are clear to auscultation, no crackles or wheezes. Heart:     Normal rate and regular rhythm. S1 and S2 normal without gallop, murmur, click, rub or other extra sounds. Abdomen:     Bowel sounds positive,abdomen soft and non-tender without masses, organomegaly or hernias noted. Extremities:     No clubbing, cyanosis,  edema, or deformity noted with normal full range of motion of all joints.      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 Orders: H pylori-[facility] ([address]109) [facility]- Est  Level 4 ([address])   Problem # 2:  ANXIETY (ICD-300.00) Assessment: Deteriorated Pt is to be back on Ativan.  I consider her a low risk.  90 day supply for her is 20 tablets.  Orders: [facility]- Est  Level 4 (99214)   Problem # 3:  GOITER NOS (ICD-240.9) Assessment: Unchanged Recheck TSH in a few months.  Dyspepsia Assessment/Plan:  Step Therapy: GERD Treatment Protocols:    Step-1: failed    Step-2: failed    Step-3: started    PPI chosen: Protonix 40mg  by mouth once a day x 8 weeks   Patient Instructions: 1)  Please schedule a follow-up appointment in 3 months.  Appended Document: fu on test results wk    Lab Visit  Laboratory Results   Blood Tests   Date/Time Recieved: April 30, 2007 5:14  PM  Date/Time Reported: April 30, 2007 5:27 PM     H. pylori: positive Comments: ...............test performed by......Marland KitchenBonnie A. [place], [place] (ASCP)     Orders Today:  Appended Document: Orders Update    Clinical Lists Changes  Problems: Added new problem of HELICOBACTER PYLORI INFECTION (ICD-041.86)  Appended Document: fu on test results wk      Dyspepsia History:      There is a prior history of GERD.  The patient does not have a prior history of documented ulcer disease.  She has a history of a positive H. Pylori serology.             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 Take Biaxin for 14 days per protocol.  Medications Added to Medication List This Visit: 1)  Biaxin 500 Mg Tabs (Clarithromycin) .... Take 1 tablet 3 times daily for 14 days  Dyspepsia Assessment/Plan:  Step Therapy: GERD Treatment Protocols:     Step-1: failed    Step-2: failed    Step-3: started  H. Pylori:    Treatment for H.Pylori:  Prilosec 20mg  BID x 14 days then 20mg  QD x 14 more days,Biaxin 500mg  TID x 14 days.

## 2010-12-26 NOTE — Progress Notes (Signed)
Summary: phn msg  Phone Note Call from Patient Call back at Home Phone [phone]   Caller: Patient Summary of Call: pt said that when she called to reschedule ultrasound they would not without new orders.   Initial call taken by: Clydell Hakim,  May 25, 2009 10:55 AM  Follow-up for Phone Call        Message sent to MD for new order. Follow-up by: Modesta Messing LPN,  May 25, 2009 11:37 AM  Additional Follow-up for Phone Call Additional follow up Details #1::        put in new order.  Additional Follow-up by: Ardeen Garland, MD    Additional Follow-up for Phone Call Additional follow up Details #2::    Faxed new order.  Left message on pts voicemail to call back. Follow-up by: Modesta Messing LPN,  May 26, 980 8:56 AM  called pt. pt missed appt in march due to family problems. request pm appt. can leave message on answ. machine. sched. appt for 06-03-09 at 1 pm. thekla slade,cma app[facility]-03-09 at 1pm.

## 2010-12-26 NOTE — Assessment & Plan Note (Signed)
Summary: thyroid wp   Vital Signs:  Patient Profile:   50 Years Old Female Height:     63 inches Weight:      169.5 pounds BMI:     30.13 Temp:     97.5 degrees F Pulse rate:   86 / minute BP sitting:   124 / 82  (left arm)  Pt. in pain?   yes    Location:   neck    Intensity:   8  Vitals Entered By: Dedra Skeens CMA, (December 31, 2007 9:10 AM)                 MAMMOGRAM SCHEDULED 02/06/2008 AT 9AM AT [facility] AND MOD BARIUM SWALLOW SCHEDULED FOR 01/08/2008 AT 11:30 AM AT [facility] RADIOLOGY AND ORDERS SENT VIA FAX.  PT IS AGREEABLE WITH BOTH APPT TIMES ..................Marland KitchenDelores Pate-Gaddy, CMA (AAMA) December 31, 2007 10:28 AM   Chief Complaint:  f/u thyroid.  History of Present Illness: HPI/ROS Chronic problem #1: Goiter - Pt seen previously for this with negative scans and recommendation to follow TSH periodically.  She notes subjective increase of the size of her throat.  She says it makes her feel as though she can't swallow liquids or solids.  No gagging.  She notes further that she has no energy, and despite starting an exercise program at the gym, she's gaining weight.  However, our records note a 5lb weight loss since her last visit.  No chest pain, shortness of breath, palpitations. HPI/ROS Chronic problem #2:  Reflux - She cannot afford surgery for her hiatal hernia, and sees [facility] for GI.  She notes that Prevacid was the best for her reflux symptoms, and that she drinks a bottle of Maalox daily for her sx.  She is currently on protonix. HPI/ROS Chronic problem #3:  Anxiety - She is poorly controlled for this.  All of this worry is making her have regular panic attacks.  She is asking for Ativan.  ROS:  Per HPI. Tobacco Use:  See Risk Factors and CPOE. Obesity:  BMI noted on VS and CPOE where applicable.   Current Allergies: SULFAMETHOXAZOLE (SULFAMETHOXAZOLE)    Risk Factors:     Counseled to quit/cut down tobacco use:  yes    Physical  Exam  General:     no acute distress Neck:     mild symmetrical enlargement, no masses, tenderness or thyromegaly.  some ttp along posterior muscles. Lungs:     clear Heart:     regular rate, no murmur Abdomen:     soft, good bs, no tenderness Neurologic:     no tremor.  no focality. Skin:     Intact without suspicious lesions or rashes Psych:     anxious.  judgement and insight intact.  oriented.    Impression & Recommendations:  Problem # 1:  GOITER NOS (ICD-240.9) Assessment: Deteriorated Fatigue, dysphagia concerning. Uncertain prognosis. Barium swallow, TSH labs and BMET to start. 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 Close follow-up. Orders: Mod Barium Swallow (Mod Barium Swallow) [facility]- Est  Level 4 (99214)   Problem # 3:  FATIGUE (ICD-780.79) Assessment: New Suspect possibly 2/2 thryoid dysfunction, will start work-up.  Orders: Basic Met-[facility] [phone]) CBC-[facility] ([id number]) TSH-[facility] [phone]) [facility]- Est  Level 4 (66063)   Problem # 4:  GASTROESOPHAGEAL REFLUX,  NO ESOPHAGITIS (ICD-530.81) Assessment: Deteriorated She cannot get anything stronger [place] the [place] pharmacy. We've no samples. Advising return to [facility] for additional ideas.  Her updated medication list for this problem includes:    Protonix 40 Mg Tbec (Pantoprazole sodium) .Marland Kitchen... Take 1 tablet by mouth twice a day   Problem # 5:  ANXIETY (ICD-300.00) She stopped Wellbutrin secondary to "feeling bad". Will try Celexa.  Her updated medication list for this problem includes:    Ativan 1 Mg Tabs (Lorazepam) .Marland Kitchen... Take 1/2 tablet by mouth in event of a panic attack    Celexa 10 Mg Tabs (Citalopram hydrobromide) .Marland Kitchen... Take one tablet daily for one week, then two daily   Problem # 6:  TOBACCO  DEPENDENCE (ICD-305.1) Assessment: Unchanged Advised cessation.  Patient says she bought nicotine patches but didn't know if she could use them.  Advised trying the patches. Orders: [facility]- Est  Level 4 (99214)   Complete Medication List: 1)  Ativan 1 Mg Tabs (Lorazepam) .... Take 1/2 tablet by mouth in event of a panic attack 2)  Protonix 40 Mg Tbec (Pantoprazole sodium) .... Take 1 tablet by mouth twice a day 3)  Valtrex 1 Gm Tabs (Valacyclovir hcl) .... Take 1/2 tablet by mouth once a day 4)  Prednisone 20 Mg Tabs (Prednisone) .... 2 tablets daily for 7 days 5)  Azithromycin 500 Mg Tabs (Azithromycin) .Marland Kitchen.. 1 tablet daily for three days 6)  Ventolin Hfa 108 (90 Base) Mcg/act Aers (Albuterol sulfate) .... 2-4 puffs every four hours as needed for wheezing 7)  Celexa 10 Mg Tabs (Citalopram hydrobromide) .... Take one tablet daily for one week, then two daily  Other Orders: Mammogram (Screening) (Mammo)   Patient Instructions: 1)  Please schedule a follow-up appointment in 2 weeks.    Prescriptions: ATIVAN 1 MG TABS (LORAZEPAM) Take 1/2 tablet by mouth in event of a panic attack  #5 x 0   Entered and Authorized by:   Towana Badger MD   Signed by:   Towana Badger MD on 12/31/2007   Method used:   Print then Give to Patient   RxID:   [id number] CELEXA 10 MG  TABS (CITALOPRAM HYDROBROMIDE) Take one tablet daily for one week, then two daily  #68 x 0   Entered and Authorized by:   Towana Badger MD   Signed by:   Towana Badger MD on 12/31/2007   Method used:   Print then Give to Patient   RxID:   [id number]  ]

## 2010-12-26 NOTE — Letter (Signed)
Summary: Generic Letter  [facility]  [address]   [place], [place] [address]   Phone: [phone]  Fax: [phone]    07/04/2009  Sheri Rivera [address] [place], [place]  [address]  Dear Sheri Rivera,  Your TSH test, which measures your thyroid activity, was completely normal and very similar to the level it has been when we have checked in the past.  If you have any questions, please call our office.      Sincerely,    Ardeen Garland  MD  Appended Document: Generic Letter mailed

## 2010-12-26 NOTE — Progress Notes (Signed)
Summary: problem with medication  Phone Note Call from Patient Call back at [phone]   Reason for Call: Talk to Nurse Summary of Call: pt is requesting to speak with RN re: the rx she was changed to, sts she cannot afford her medication and pt is frustrated b/c her meds continue to be changed. Initial call taken by: ERIN LEVAN,  December 31, 2007 4:18 PM  Follow-up for Phone Call        Spoke with patient and she reports Celexa will cost her $60.00. she cannot afford it.  In past she tried  Wellbutrin which cost $79.00 and she had  problems with the medication and could not continue it. States she has been on Ativan since she was 50 years old and only takes it when she has a panic attack. She states she is frustrated because MD is trying to get her off Ativan and she cannot afford the other meds he is trying to get her started on . Marland KitchenAdvised will send message to MD.  Follow-up by: Theresia Lo RN,  December 31, 2007 4:40 PM  Additional Follow-up for Phone Call Additional follow up Details #1::        Citalopram is on the [employer] $4 list.  I explained this to her when she saw me.  She is frustrated because she wants Ativan only for her anxiety, and I am not comfortable prescribing benzos a front line for anxiety.  Incidentally, the last medication I rx'd was also on the [employer] $4 list.  I explained this to her as well when she saw me.  To enable her to go to [facility] with the rx was why I printed it and gave it to her instead of eRXing it. Additional Follow-up by: Towana Badger MD,  January 01, 2008 9:00 AM    Additional Follow-up for Phone Call Additional follow up Details #2::    message given to patient. she states she has [facility] and they have [facility]. advised her to contact the pharmacy and see if they will transferr rx over  to [facility] or give her the prescription back. she will call back if this can not be done. Follow-up by: Theresia Lo  RN,  January 01, 2008 11:06 AM

## 2010-12-26 NOTE — Assessment & Plan Note (Signed)
Summary: DNKA    Current Allergies: SULFAMETHOXAZOLE (SULFAMETHOXAZOLE)        Complete Medication List: 1)  Ativan 1 Mg Tabs (Lorazepam) .... Take 1/2 tablet by mouth once a day 2)  Protonix 40 Mg Tbec (Pantoprazole sodium) .... Take 1 tablet by mouth twice a day 3)  Valtrex 1 Gm Tabs (Valacyclovir hcl) .... Take 1/2 tablet by mouth once a day     ]

## 2010-12-26 NOTE — Progress Notes (Signed)
Summary: phn msg  Phone Note Call from Patient Call back at Home Phone [phone]   Caller: Patient Summary of Call: Pt says she has had two phone calls from our office and no one has left a message wondering if Dr. Georgiana Shore might be trying to call her. Initial call taken by: Clydell Hakim,  December 26, 2009 10:52 AM  Follow-up for Phone Call        called pt and advised to sched. ov with dr.mayans in order to have rx's refilled. pt agreed.. Follow-up by: Arlyss Repress CMA,,  December 26, 2009 10:57 AM

## 2010-12-26 NOTE — Progress Notes (Signed)
Summary: Triage  Phone Note Call from Patient Call back at Home Phone [phone]   Reason for Call: Talk to Nurse Summary of Call: pt is requesting to speak with RN, sts her arm pit is really painful and also thinks her uterus has fallen, sts it is coming out of her vagina Initial call taken by: Knox Royalty,  April 27, 2008 3:16 PM  Follow-up for Phone Call        patient reports pain in left axilla for 3-4 weeks. denies any knots, redness or swelling . no fever. states she has recently had a boil on inner groin area which bursted and drained much pus.. she is on doxycycline from her dentist currently. work in appointment scheduled tomorrw for this problem.  also she reports she has noticed her bladder almost protruding from vagina  several times recently. this has been an ongoing problem for a while but she is noticing more recently. appointment scheduled with pcp for 05/03/08 Follow-up by: Theresia Lo RN,  April 27, 2008 3:30 PM

## 2010-12-26 NOTE — Progress Notes (Signed)
Summary: Request to change rx  Phone Note From Pharmacy Call back at [phone]   [facility]er: [facility]  [address]  [facility] Summary of Call: requesting to speak with RN to ok a month supploy on pts Rxs Initial call taken by: Knox Royalty,  June 28, 2008 1:38 PM  Follow-up for Phone Call        called pharmacist and ok'd one additional refill on pt's meds. pt had voucher for 2 months, but rx for only one month Follow-up by: Arlyss Repress CMA,,  June 28, 2008 1:56 PM

## 2010-12-26 NOTE — Miscellaneous (Signed)
Summary: PAP Update  Clinical Lists Changes       Will get PAP on next visit.

## 2010-12-26 NOTE — Progress Notes (Signed)
Summary: triage  Phone Note Call from Patient Call back at Home Phone [phone]   Caller: Patient Summary of Call: pt c/o short of breath and lungs feels heavy Initial call taken by: De Nurse,  October 28, 2008 10:28 AM  Follow-up for Phone Call        has been around a child with bronchitis. thinks she has caught child's illness. Very SOB. back pain. fatigued. has used her inhaler with no relief. speech fluid during call with no sob noted. appt today with [facility] Follow-up by: Golden Circle RN,  October 28, 2008 10:47 AM

## 2010-12-26 NOTE — Progress Notes (Signed)
Summary: phn msg  Phone Note Call from Patient Call back at Home Phone [phone]   Caller: Patient Summary of Call: pt was scheduled for an ultrasound on her thyroid and missed it needs it to be rescheduled.  Please call her. Initial call taken by: Clydell Hakim,  May 25, 2009 10:30 AM  Follow-up for Phone Call        Pt given number to reschedule. Follow-up by: Modesta Messing LPN,  May 25, 2009 10:50 AM

## 2010-12-26 NOTE — Progress Notes (Signed)
Summary: referral  Phone Note Call from Patient Call back at Home Phone [phone]   Caller: Patient Summary of Call: pt has not heard from Oral surgeon still in pain Initial call taken by: De Nurse,  August 29, 2010 2:06 PM  Follow-up for Phone Call        called pt. referral was faxed to dental clinic. will re-fax again and also given numbers to pt.  i don't think, that they have a oral surgeon at the clinic. Follow-up by: Arlyss Repress CMA,,  August 29, 2010 4:56 PM

## 2010-12-26 NOTE — Progress Notes (Signed)
Summary: Rx Prob  Phone Note Call from Patient Call back at Home Phone [phone]   Caller: Patient Summary of Call: Pt went to get rx for Ativan filled this am, but she usually gets the generic and the script was wrote for the name brand.  She can not afford name brand.  [facility] [address] Initial call taken by: Clydell Hakim,  March 21, 2010 10:32 AM  Follow-up for Phone Call        spoke with Dr. Georgiana Shore and she ok's ativan rx that was given to patient in Feb 2011 to be  generic instead of brand name. pharmacy notified. Follow-up by: Theresia Lo RN,  March 21, 2010 11:48 AM

## 2010-12-26 NOTE — Assessment & Plan Note (Signed)
Summary: dnka,ag      Current Allergies: SULFAMETHOXAZOLE (SULFAMETHOXAZOLE)        Complete Medication List: 1)  Ativan 1 Mg Tabs (Lorazepam) .... Take 1/2 tablet by mouth in event of a panic attack 2)  Protonix 40 Mg Tbec (Pantoprazole sodium) .... Take 1 tablet by mouth twice a day 3)  Valtrex 1 Gm Tabs (Valacyclovir hcl) .... Take 1/2 tablet by mouth once a day 4)  Ventolin Hfa 108 (90 Base) Mcg/act Aers (Albuterol sulfate) .... 2-4 puffs every four hours as needed for wheezing 5)  Fluticasone Propionate 50 Mcg/act Susp (Fluticasone propionate) .... 2 sprays each nostril once daily 6)  Advair Diskus 250-50 Mcg/dose Misc (Fluticasone-salmeterol) .Marland Kitchen.. 1 puff 2 times daily    ]
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
## 2010-12-26 NOTE — Assessment & Plan Note (Signed)
Summary: h fu wp   Vital Signs:  Patient Profile:   50 Years Old Female Height:     63 inches Weight:      165.8 pounds Temp:     97.9 degrees F Pulse rate:   67 / minute BP sitting:   121 / 82  (left arm)  Pt. in pain?   no  Vitals Entered By: Alphia Kava (March 02, 2008 9:03 AM)              Is Patient Diabetic? No     PCP:  Ardeen Garland  MD  Chief Complaint:  hospital f/u.  History of Present Illness: Sheri Rivera is a new patient to me, though she has been a patient at our clinic for some time.  She comes in today for several reasons. 1) to meet new doctor - she recently requested a physician change. 2) to discuss recent overnight hospital stay and upcoming abdominal surgery.  She states she has a hiatal hernia which has become very severe.  She has significant problems eating.  SHe describes it as not all of her food will go into her stomach and sits in her esophagus (as was told to her by surgeon in the hospital).  SHe has an appt with the surgeon (can't remember name) at [facility] on April 17th to discuss the timing of the surgery.  She stayed overnight in the hospital for dehydration but has been doing much better since discharge. 3) Nasal congestion and cough - she does smoke and is wanting to try to quit.  However lately she has been experiencing increased cough with nasal congestion.  OTC Claritin has helped some but she feels she needs a decongestant.  No fevers.  No shortness of breath 4) medications.  SHe needs handwritten prescriptions to take to the [facility].  SHe had been taking advair but has been off of it for about 3 weeks and has noticed a deterioration in her breathing, especially during exercise.  She takes Protonix, albuterol, advair, valtrex and ativan. She states the ativan is the only thing that helps her occassional panic attacks.  SHe states 2-3 times a month she takes 1/2 a tablet.  She brought the bottle with her. It was  filled on 01/23/08 and there were 7.5 pills left (prescribed quanitity was 10).  SHe states her previous doctor has tried antidepressants for her anxiety before and she doesn't tolerate them.  SHe particularly did not like Celexa - it gave her sever mood swings.    Updated Prior Medication List: ATIVAN 1 MG TABS (LORAZEPAM) Take 1/2 tablet by mouth in event of a panic attack PROTONIX 40 MG TBEC (PANTOPRAZOLE SODIUM) Take 1 tablet by mouth twice a day VALTREX 1 GM TABS (VALACYCLOVIR HCL) Take 1/2 tablet by mouth once a day VENTOLIN HFA 108 (90 BASE) MCG/ACT  AERS (ALBUTEROL SULFATE) 2-4 puffs every four hours as needed for wheezing FLUTICASONE PROPIONATE 50 MCG/ACT  SUSP (FLUTICASONE PROPIONATE) 2 sprays each nostril once daily ADVAIR DISKUS 250-50 MCG/DOSE MISC (FLUTICASONE-SALMETEROL) 1 puff 2 times daily  Current Allergies: SULFAMETHOXAZOLE (SULFAMETHOXAZOLE)    Risk Factors:     Counseled to quit/cut down tobacco use:  yes Passive smoke exposure:  no Drug use:  no HIV high-risk behavior:  no   Review of Systems       The patient complains of weight gain, prolonged cough, and abdominal pain.  The patient denies anorexia, fever, and chest pain.  Physical Exam  General:     alert, well-developed, well-nourished, and well-hydrated.   Eyes:     pupils equal and pupils round.   Ears:     R ear normal and L ear normal.   Nose:     no external deformity, no external erythema, no nasal discharge, no nasal polyps, no nasal mucosal lesions, no sinus percussion tenderness, mucosal erythema, and mucosal edema.   Mouth:     pharynx pink and moist and postnasal drip.   Lungs:     normal work of breathing with diffusely decreased breath sounds.  NO wheezes or crackles. Heart:     Normal rate and regular rhythm. S1 and S2 normal without gallop, murmur, click, rub or other extra sounds.    Impression & Recommendations:  Problem # 1:  ALLERGIC RHINITIS CAUSE UNSPECIFIED  (ICD-477.9) Assessment: New Patient's history and exam is consistent with allergic rhinitis.  Patient will try flonase and claritin.  If she feels she needs a little more decongestant initially, she may take claritin-D for 1-2 weeks.   Her updated medication list for this problem includes:    Fluticasone Propionate 50 Mcg/act Susp (Fluticasone propionate) .Marland Kitchen... 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  SHe had stopped taking it after she was divorced, but she is in a new monogamous relationship and desires to resume suppressive therapy.  Valtrex treatment reinitiated.  Orders: [facility]- Est  Level 4 (98119)   Problem # 3:  HERNIA, HIATAL, NONCONGENITAL (ICD-553.3) Assessment: Unchanged Patient has appointment with surgeon this month.  Will let me know when surgery is planned and will ask that records of that visit/surgery be forwarded to myself as well. Orders: [facility]- Est  Level 4 (14782)   Problem # 4:  ANXIETY (ICD-300.00) Patient appears to be truthful about her limited benzo use.  Will continue this for occassional anxiety at this time but will monitor usage closely.  The following medications were removed from the medication list:    Celexa 10 Mg Tabs (Citalopram hydrobromide) .Marland Kitchen... Take one tablet daily for one week, then two daily  Her updated medication list for this problem includes:    Ativan 1 Mg Tabs (Lorazepam) .Marland Kitchen... Take 1/2 tablet by mouth in event of a panic attack  Orders: [facility]- Est  Level 4 (95621)   Problem # 5:  Preventive Health Care (ICD-V70.0) Assessment: Comment Only Patient is overdue for both a pap smear and mammogram.  She states she cannot afford a mammogram ( she is self-pay ) though she is willing to return for a full physical with pap smear.  Will look into any available assistance for mammogram to discuss at next visit.     Problem # 6:  TOBACCO DEPENDENCE (ICD-305.1) Discussed smoking cessation.  Patient has already bought nicotine patches to try.  Wanted to discuss safety of them.  Informed patient they were no more harmful than smoking.  Applauded patient for her decision to try and quit.  Will begin nicotine patches and will call if needs any other assistance or experiences problems.  Orders: [facility]- Est  Level 4 (99214)   Complete Medication List: 1)  Ativan 1 Mg Tabs (Lorazepam) .... Take 1/2 tablet by mouth in event of a panic attack 2)  Protonix 40 Mg Tbec (Pantoprazole sodium) .... Take 1 tablet by mouth twice a day 3)  Valtrex 1 Gm Tabs (Valacyclovir hcl) .... Take 1/2 tablet by mouth once a day 4)  Ventolin Hfa 108 (90 Base) Mcg/act Aers (Albuterol sulfate) .... 2-4 puffs every four hours as needed for wheezing 5)  Fluticasone Propionate 50 Mcg/act Susp (Fluticasone propionate) .... 2 sprays each nostril once daily 6)  Advair Diskus 250-50 Mcg/dose Misc (Fluticasone-salmeterol) .Marland Kitchen.. 1 puff 2 times daily   Patient Instructions: 1)  THank you for coming in today. 2)  Please start the flonase nasal spray 2 sprays each nostril daily.  If that does not help your sinus congestion and drainage, you may try Claritin-D, available at a pharmacy.  Please only take for a short time (1-2 weeks, then switch back to plain Claritin). 3)  Please schedule a complete physical with pap smear at your earliest convenience.  We will talk more about mammogram screening and other necessary health preventions at that appointment. 4)  Congratulations on your new granddaughter!  Have fun!    Prescriptions: PROTONIX 40 MG TBEC (PANTOPRAZOLE SODIUM) Take 1 tablet by mouth twice a day  #60 x 6   Entered and Authorized by:   Ardeen Garland  MD   Signed by:   Ardeen Garland  MD on 03/02/2008   Method used:   Handwritten   RxID:   [id number] VALTREX 1 GM TABS (VALACYCLOVIR HCL) Take 1/2 tablet by mouth once a day  #16 x 6    Entered and Authorized by:   Ardeen Garland  MD   Signed by:   Ardeen Garland  MD on 03/02/2008   Method used:   Handwritten   RxID:   [id number] VENTOLIN HFA 108 (90 BASE) MCG/ACT  AERS (ALBUTEROL SULFATE) 2-4 puffs every four hours as needed for wheezing  #1 x 6   Entered and Authorized by:   Ardeen Garland  MD   Signed by:   Ardeen Garland  MD on 03/02/2008   Method used:   Handwritten   RxID:   [id number] ADVAIR DISKUS 250-50 MCG/DOSE MISC (FLUTICASONE-SALMETEROL) 1 puff 2 times daily  #1 x 6   Entered and Authorized by:   Ardeen Garland  MD   Signed by:   Ardeen Garland  MD on 03/02/2008   Method used:   Handwritten   RxID:   [id number] FLUTICASONE PROPIONATE 50 MCG/ACT  SUSP (FLUTICASONE PROPIONATE) 2 sprays each nostril once daily  #1 x 6   Entered and Authorized by:   Ardeen Garland  MD   Signed by:   Ardeen Garland  MD on 03/02/2008   Method used:   Handwritten   RxID:   [id number] VALTREX 1 GM TABS (VALACYCLOVIR HCL) Take 1/2 tablet by mouth once a day  #15 x 6   Entered and Authorized by:   Ardeen Garland  MD   Signed by:   Ardeen Garland  MD on 03/02/2008   Method used:   Handwritten   RxID:   [id number]  ]

## 2010-12-26 NOTE — Miscellaneous (Signed)
Summary: dnka [place]  Clinical Lists Changes dnka her ultrasound appt.Golden Circle RN  February 21, 2009 4:03 PM

## 2010-12-26 NOTE — Assessment & Plan Note (Signed)
Summary: stopped smoking & very anxious   Vital Signs:  Patient Profile:   50 Years Old Female Height:     63 inches Weight:      166 pounds Pulse rate:   82 / minute BP sitting:   118 / 86  Vitals Entered By: Lillia Pauls CMA (May 20, 2008 2:02 PM)                 Visit Type:  Follow up  ER visit PCP:  Ardeen Garland  MD  Chief Complaint:  FU ER visit.  History of Present Illness:    This is a 50 year old female with diagnosis of anxiety, tobacco abuse, asthma, and possible COPD presenting as follow-up from an ER visit at [facility].  She states she had a chest x-ray there and was told her COPD was worsening.  She states she has never had pulmonary function testing.  She was seen for cough and worsening shortness of breath.  She state she did not recieve any intervention at the ER visit in the form of medications.  She states she currently has no fever and her cough is currently improved.  She is currently not significantly short of breath.       She also states she would like to stop smoking.  She has been using some nicotine replacement therapies which helped for a couple days but she states she has continued to smoke.  She would like some other form of therapy to help stop smoking.       She states she is also scheduled for surgery to repair a hiatal hernia in July.  She states she has had a preoperative evaluation recently but now that her COPD has worsened she states she was told she needed further evaluation.      Updated Prior Medication List: ATIVAN 1 MG TABS (LORAZEPAM) Take 1/2 tablet by mouth in event of a panic attack PROTONIX 40 MG TBEC (PANTOPRAZOLE SODIUM) Take 1 tablet by mouth twice a day VALTREX 1 GM TABS (VALACYCLOVIR HCL) Take 1/2 tablet by mouth once a day VENTOLIN HFA 108 (90 BASE) MCG/ACT  AERS (ALBUTEROL SULFATE) 2-4 puffs every four hours as needed for wheezing FLUTICASONE PROPIONATE 50 MCG/ACT  SUSP (FLUTICASONE PROPIONATE) 2 sprays each nostril once  daily ADVAIR DISKUS 250-50 MCG/DOSE MISC (FLUTICASONE-SALMETEROL) 1 puff 2 times daily CHANTIX STARTING MONTH PAK 0.5 MG X 11 & 1 MG X 42  MISC (VARENICLINE TARTRATE) Use as directed  Current Allergies: SULFAMETHOXAZOLE (SULFAMETHOXAZOLE)  Past Medical History:    Reviewed history from 09/29/2007 and no changes required:       1.6 mm thyroid nodule - biopsy negative, CBC, thyroid, ANA all normal in 8/05, h/o alopecia, h/o peptic ulcer by EGD followed by Dr. Ewing Schlein              PT notes visit to [facility] last year with SOB, given Albuterol.  Intermittent SOB, not worked up.     Review of Systems  General      Denies chills, fatigue, fever, and malaise.  ENT      Denies ear discharge and nasal congestion.  CV      Denies chest pain or discomfort.  Resp      Complains of cough, shortness of breath, and sputum productive.      Denies chest pain with inspiration and coughing up blood.  GI      Complains of indigestion.      Denies constipation, diarrhea,  nausea, and vomiting.  Psych      Complains of anxiety.   Physical Exam  General:     Well-developed,well-nourished,in no acute distress; alert,appropriate and cooperative throughout examination Head:     Normocephalic and atraumatic without obvious abnormalities. No apparent alopecia or balding. Mouth:     Oral mucosa and oropharynx without lesions or exudates.  Teeth in good repair. Lungs:     Decreased breath sounds in all lung fields without rales, rhonchi, or wheezes appreciated.   Heart:     Normal rate and regular rhythm. S1 and S2 normal without gallop, murmur, click, rub or other extra sounds. Abdomen:     Bowel sounds positive,abdomen soft and non-tender without masses, organomegaly or hernias noted. Psych:     Oriented X3, memory intact for recent and remote, normally interactive, and good eye contact.  Mildly anxious.     Impression & Recommendations:  Problem # 1:  ? of CHRONIC OBSTRUCTIVE PULMONARY  DISEASE, ACUTE (ICD-496)    Patient on therpay for lung disease though she has never had pulmonary function testing.  Will evaluate PFT's and follow with PCP in 1-2 weeks.   The PFT's are last indicated test prior to clearance for surgery.  She had EKG at hospital prior to today for preoperative evaluation.   Her updated medication list for this problem includes:    Ventolin Hfa 108 (90 Base) Mcg/act Aers (Albuterol sulfate) .Marland Kitchen... 2-4 puffs every four hours as needed for wheezing    Advair Diskus 250-50 Mcg/dose Misc (Fluticasone-salmeterol) .Marland Kitchen... 1 puff 2 times daily  Orders: [facility]- Est Level  3 (62952)   Problem # 2:  TOBACCO DEPENDENCE (ICD-305.1)    Will precribed chantix and instructed in use.  Patient states she may have difficulty affording medication.   Her updated medication list for this problem includes:    Chantix Starting Month Pak 0.5 Mg X 11 & 1 Mg X 42 Misc (Varenicline tartrate) ..... Use as directed  Orders: [facility]- Est Level  3 (84132)   Complete Medication List: 1)  Ativan 1 Mg Tabs (Lorazepam) .... Take 1/2 tablet by mouth in event of a panic attack 2)  Protonix 40 Mg Tbec (Pantoprazole sodium) .... Take 1 tablet by mouth twice a day 3)  Valtrex 1 Gm Tabs (Valacyclovir hcl) .... Take 1/2 tablet by mouth once a day 4)  Ventolin Hfa 108 (90 Base) Mcg/act Aers (Albuterol sulfate) .... 2-4 puffs every four hours as needed for wheezing 5)  Fluticasone Propionate 50 Mcg/act Susp (Fluticasone propionate) .... 2 sprays each nostril once daily 6)  Advair Diskus 250-50 Mcg/dose Misc (Fluticasone-salmeterol) .Marland Kitchen.. 1 puff 2 times daily 7)  Chantix Starting Month Pak 0.5 Mg X 11 & 1 Mg X 42 Misc (Varenicline tartrate) .... Use as directed   Patient Instructions: 1)  Follow-up for pulmonary function testing in pharmacy clinic as soon as possible.   2)  Follow-up with Dr Georgiana Shore at her next available appointment.     Prescriptions: CHANTIX STARTING MONTH PAK 0.5 MG X 11 & 1 MG X 42   MISC (VARENICLINE TARTRATE) Use as directed  #One pack x 0   Entered and Authorized by:   Karlton Lemon MD   Signed by:   Karlton Lemon MD on 05/20/2008   Method used:   Elect[facility] to ...       [facility]  [address]  [id number]       [address]       [facility]  [place], [place]  [address]       Ph: [phone] or [phone]       Fax: [phone]   RxID:   [id number] ATIVAN 1 MG TABS (LORAZEPAM) Take 1/2 tablet by mouth in event of a panic attack  #20 x 0   Entered and Authorized by:   Karlton Lemon MD   Signed by:   Karlton Lemon MD on 05/20/2008   Method used:   Print then Give to Patient   RxID:   [id number] CHANTIX STARTING MONTH PAK 0.5 MG X 11 & 1 MG X 42  MISC (VARENICLINE TARTRATE) Use as directed  #QS x 0   Entered and Authorized by:   Karlton Lemon MD   Signed by:   Karlton Lemon MD on 05/20/2008   Method used:   Electronically sent to ...       [facility]  [address]  [id number]       [address]       [place], [place]  [address]       Ph: [phone] or [phone]       Fax: [phone]   RxID:   [id number]  ]  Appended Document: stopped smoking & very anxious Patient with anxiety and is on ativan as needed.  Prescription provided today.

## 2010-12-26 NOTE — Progress Notes (Signed)
Summary: returning someone's call  Phone Note Call from Patient Call back at Home Phone [phone]   Reason for Call: Talk to Nurse Summary of Call: pt is returning call, she had told us [facility]. Initial call taken by: ERIN LEVAN,  May 01, 2007 2:55 PM  Follow-up for Phone Call        biaxin already e-rx'ed to map program Follow-up by: Lillia Pauls CMA,  May 01, 2007 4:17 PM

## 2010-12-26 NOTE — Progress Notes (Signed)
Summary: NEEDS OV BEFORE RF OF ATIVAN/TS  Phone Note Refill Request Call back at [phone]   Refills Requested: Medication #1:  ATIVAN 1 MG TABS Take 1/2 tablet by mouth in event of a panic attack [BMN] Pt need generic form Lorazapam refilled.  Only have six left.  [facility] [address]  Initial call taken by: Abundio Miu,  September 15, 2010 3:19 PM  Follow-up for Phone Call        CALLED PT AND EXPLAINED, THAT SHE NEEDS TO HAVE OV. UNABLE TO REFILL ATIVAN. PT AGREED. Follow-up by: Arlyss Repress CMA,,  September 15, 2010 3:51 PM
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
## 2010-12-26 NOTE — Assessment & Plan Note (Signed)
Summary: tooth pain & gum infection   Vital Signs:  Patient Profile:   50 Years Old Female Height:     63 inches Weight:      162.8 pounds Temp:     97.9 degrees F Pulse rate:   84 / minute BP sitting:   109 / 78  (left arm)  Pt. in pain?   yes    Location:   right side of face and tooth    Intensity:   8    Type:       aching  Vitals Entered ByJacki Cones RN (February 05, 2008 4:14 PM)                  PCP:  Ardeen Garland  MD  Chief Complaint:  tooth and face pain top left side of mouth.  History of Present Illness: Sheri Rivera is a 50yo WF c/o worsening left upper molar tooth/gum pain for the past several days.  She denies any bleeding, pustular drainage, or tooth decay.  She endorses gum swelling and severe tenderness to palpation.  She states that the pain is radiating into her left cheek.  She denies any fever or chills.  She has been taking ibuprofen with mild relief.  She has attempted to call her dentist but they are not willing to see her until she has been treated with antibiotics (per patient report).     Current Allergies: SULFAMETHOXAZOLE (SULFAMETHOXAZOLE)    Risk Factors:    Review of Systems      See HPI   Physical Exam  General:     White female in moderate distress Head:     No facial swelling or erythema; moderate tenderness of palpation of the left cheek Mouth:     Mild gum edema, no obvious bleeding or pustular drainage present; no obvious tooth decay;  moderate tenderness to palpation; no fluctuance.   Faxed dental referral to Angie at [facility].  Pt advised to call us back if she has not heard from Angie by next Monday or Tuesday. ..................................................................Marland KitchenAMY MARTIN RN  February 05, 2008 5:25 PM     Impression & Recommendations:  Problem # 1:  DENTAL PAIN (ICD-525.9) Assessment: New No abscess appreciated.  Provided patient with Ultram and PCN until she can be seen by the [facility] dental  clinic.  Provided referral to dental clinic.  Call made by Jacki Cones.  Pt instructed to return to clinic if pain not adequately controlled. Orders: [facility]- Est Level  3 ([address])   Complete Medication List: 1)  Ativan 1 Mg Tabs (Lorazepam) .... Take 1/2 tablet by mouth in event of a panic attack 2)  Protonix 40 Mg Tbec (Pantoprazole sodium) .... Take 1 tablet by mouth twice a day 3)  Valtrex 1 Gm Tabs (Valacyclovir hcl) .... Take 1/2 tablet by mouth once a day 4)  Prednisone 20 Mg Tabs (Prednisone) .... 2 tablets daily for 7 days 5)  Azithromycin 500 Mg Tabs (Azithromycin) .Marland Kitchen.. 1 tablet daily for three days 6)  Ventolin Hfa 108 (90 Base) Mcg/act Aers (Albuterol sulfate) .... 2-4 puffs every four hours as needed for wheezing 7)  Celexa 10 Mg Tabs (Citalopram hydrobromide) .... Take one tablet daily for one week, then two daily 8)  Penicillin V Potassium 500 Mg Tabs (Penicillin v potassium) .... One tablet by mouth every 8 hours for 10 days 9)  Ultram 50 Mg Tabs (Tramadol hcl) .... Take one to two tablets by mouth every 4 hours  as needed for pain   Patient Instructions: 1)  Dental clinic will call you. 2)  I have provided you with pain medication along with antibiotic in hopes that this will prepare and hold you over for your dental extraction.  Call if you have fevers or worsening head pain.    Prescriptions: ULTRAM 50 MG  TABS (TRAMADOL HCL) take one to two tablets by mouth every 4 hours as needed for pain  #40 x 0   Entered and Authorized by:   Marisue Ivan  MD   Signed by:   Marisue Ivan  MD on 02/05/2008   Method used:   Handwritten   RxID:   [id number] PENICILLIN V POTASSIUM 500 MG  TABS (PENICILLIN V POTASSIUM) one tablet by mouth every 8 hours for 10 days  #30 x 0   Entered and Authorized by:   Marisue Ivan  MD   Signed by:   Marisue Ivan  MD on 02/05/2008   Method used:   Handwritten   RxID:   [id number]  ]

## 2010-12-26 NOTE — Progress Notes (Signed)
Summary: Triage  Phone Note Call from Patient Call back at Home Phone [phone]   Summary of Call: Pt would like to speak with a nurse about having an abcess tooth. Initial call taken by: Haydee Salter,  February 05, 2008 10:02 AM  Follow-up for Phone Call        pain x 2 days. has no insurance to see her dentist. Face is swollen. was told 1 yr ago that it needed to be pulled. no one will allow her to make payments & cannot afford care. appt made for 4pm today. will give her number of low cost clinics where she may be able to get tooth pulled Follow-up by: Golden Circle RN,  February 05, 2008 10:03 AM

## 2010-12-26 NOTE — Progress Notes (Signed)
Summary: requesting stress test/requesting pcp change  Phone Note Call from Patient Call back at Home Phone [phone]   Reason for Call: Talk to Nurse Summary of Call: requesting to speak with RN about having a stress test done, also requesting pcp to be changed. sts she had already requested this before but not sure why it hasn't been changed. Initial call taken by: ERIN LEVAN,  January 27, 2008 3:34 PM  Follow-up for Phone Call        Olegario Messier, can you address? Follow-up by: Towana Badger MD,  January 28, 2008 9:14 AM  Additional Follow-up for Phone Call Additional follow up Details #1::        Spoke with patient and had a long conversation regarding her request.  Tried to explain that she will be getting a new PCP in June.  After much discussion I switched her to Dr. Georgiana Shore due to patient's insistance.   Additional Follow-up by: Dennison Nancy RN,  January 28, 2008 10:35 AM

## 2010-12-26 NOTE — Progress Notes (Signed)
Summary: phn msg  Phone Note Call from Patient Call back at Home Phone [phone]   Caller: Patient Summary of Call: Pt still has not heard from oral surgeon yet and is on her second round of antibotics and is concerned about what will happen if she does not get in. Initial call taken by: Clydell Hakim,  September 05, 2010 9:51 AM  Follow-up for Phone Call        called and spoke with pt, says she has called the adult dental clinic several times and left a message for them to return call with no responce. Told pt I would attempt to call them myself and find out how long the wait is for an appointment.  called adult dental clinic lmvm to return call Follow-up by: Tessie Fass CMA,  September 05, 2010 10:56 AM  Additional Follow-up for Phone Call Additional follow up Details #1::        called adult dental clinic again lmvm to return call. Additional Follow-up by: Tessie Fass CMA,  September 08, 2010 3:37 PM

## 2010-12-28 NOTE — Miscellaneous (Signed)
  Clinical Lists Changes  Problems: Removed problem of ACUTE BRONCHITIS (ICD-466.0) Removed problem of INFECTION, SKIN AND SOFT TISSUE (ICD-686.9) Removed problem of GENITAL HERPES, HX OF (ICD-V13.8)

## 2010-12-28 NOTE — Letter (Signed)
Summary: Generic Letter  [facility]  [address]   [place], [place] [address]   Phone: [phone]  Fax: [phone]    12/06/2010  Sheri Rivera [address] [place], [place]  [address]  Dear Ms. Ostermann,  I am happy to inform you that your recent lab was normal.  Sincerely,   Helane Rima DO  Appended Document: Generic Letter mailed

## 2010-12-28 NOTE — Assessment & Plan Note (Signed)
Summary: follow up medical issues and refills/ls   Vital Signs:  Patient profile:   50 year old female Height:      63 inches Weight:      169 pounds BMI:     30.05 Temp:     97.6 degrees F oral Pulse rate:   92 / minute BP sitting:   114 / 79  (left arm) Cuff size:   regular  Vitals Entered By: Tessie Fass CMA (December 05, 2010 11:11 AM) CC: F/U thyroid, throat feeling Is Patient Diabetic? No Pain Assessment Patient in pain? no        Primary Care Provider:  . [facility]  CC:  F/U thyroid and throat feeling.  History of Present Illness: 50 yo F:  1. Thyroid Nodules: She has struggled with a symptomatic goiter for years.  Records reviewed in office and hospital EMR: Complains of dysphagia and soreness in her neck, chronic. Worried about how she will know if it becomes cancer.  Would like to see ENT. Sept 2006 - CT neck - mild thyroid nodularity. Feb 2007 U/S - R lobe 14x19x25mm, L lobe 12x13x80mm. Multiple small nodules, largest in right lobe 7x11x16. Feb 2007 uptake scan - normal. Feb 2008 U/S - R lobe 14x19x74mm, L lobe 13x14x66mm.  Multiple small nodules, largest in right lobe 7x11x16. Feb 2008 uptake scan - normal. Feb 2009 swallow study - normal. July 2010 U/S - R lobe 16x17x13mm, L lobe 10x15x34mm, Multiple small nodules, larges in right lobe 7x15x57mm.  2. Throat Closing: Intermittent, x years, feels like it is hard to breathe and swallow sometimes. Normal swallow evaluation in 2/09. Followed by GI. "I think that it is either my tonsils or my thyroid." Applies ice to neck for swelling when it is bad. 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  Habits & Providers  Alcohol-Tobacco-Diet     Tobacco Status: current     Tobacco Counseling: to quit use of tobacco products     Cigarette Packs/Day: 0.5  Current Medications (verified): 1)  Ativan 1 Mg Tabs (Lorazepam) .... Take 1/2 Tablet By Mouth in Event of A Panic Attack 2)   Protonix 40 Mg Tbec (Pantoprazole Sodium) .... Take 1 Tablet By Mouth Twice A Day 3)  Valtrex 1 Gm Tabs (Valacyclovir Hcl) .... Take 1/2 Tablet By Mouth Once A Day 4)  Ventolin Hfa 108 (90 Base) Mcg/act  Aers (Albuterol Sulfate) .... 2-4 Puffs Every Four Hours As Needed For Wheezing 5)  Advair Diskus 250-50 Mcg/dose Misc (Fluticasone-Salmeterol) .Marland Kitchen.. 1 Puff 2 Times Daily  Allergies (verified): 1)  ! * Shellfish 2)  Sulfamethoxazole (Sulfamethoxazole) PMH-FH-SH reviewed for relevance  Social History: Smoking Status:  current Packs/Day:  0.5  Review of Systems General:  Denies chills and fever. CV:  Denies chest pain or discomfort, palpitations, shortness of breath with exertion, and swelling of feet. Resp:  Denies cough and wheezing. GI:  Denies abdominal pain. MS:  Complains of muscle aches.  Physical Exam  General:  Well-developed, well-nourished, in no acute distress; alert, appropriate and cooperative throughout examination. Vitals reviewed. Mouth:  Oral mucosa and oropharynx without lesions or exudates. No obstruction. Neck:  Supple, full ROM. Thyroid palpable. Mild fullness of neck appreciated on inspection.  Non tender. Lungs:  Normal respiratory effort, chest expands symmetrically. Lungs are clear to auscultation, no crackles or wheezes. Heart:  Normal rate and regular rhythm.     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 Orders: ENT Referral (ENT) [facility]- Est Level  3 (01601)  Problem # 2:  GOITER, MULTINODULAR (ICD-241.1) Assessment: Unchanged Recheck TSH. Orders: [facility] ([id number]) [facility]- Est Level  3 (20254)  Complete Medication List: 1)  Ativan 1 Mg Tabs (Lorazepam) .... Take 1/2 tablet by mouth in event of a panic attack 2)  Protonix 40 Mg Tbec (Pantoprazole sodium) .... Take 1 tablet by mouth twice a day 3)  Valtrex 1 Gm Tabs (Valacyclovir hcl) ....  Take 1/2 tablet by mouth once a day 4)  Ventolin Hfa 108 (90 Base) Mcg/act Aers (Albuterol sulfate) .... 2-4 puffs every four hours as needed for wheezing 5)  Advair Diskus 250-50 Mcg/dose Misc (Fluticasone-salmeterol) .Marland Kitchen.. 1 puff 2 times daily  Patient Instructions: 1)  It was nice to see you today. 2)  We are checking labs and sending you to ENT.   Orders Added: 1)  [facility] [[id number]] 2)  ENT Referral [ENT] 3)  [facility]- Est Level  3 [31517]    Prevention & Chronic Care Immunizations   Influenza vaccine: Not documented    Tetanus booster: 11/26/2002: Done.   Tetanus booster due: 11/26/2012    Pneumococcal vaccine: Not documented  Other Screening   Pap smear: Not documented    Mammogram: Not documented   Smoking status: current  (12/05/2010)   Smoking cessation counseling: yes  (06/25/2008)   Target quit date: 06/10/2008  (06/08/2008)  Lipids   Total Cholesterol: Not documented   LDL: Not documented   LDL Direct: Not documented   HDL: Not documented   Triglycerides: Not documented

## 2011-02-12 LAB — URINE MICROSCOPIC-ADD ON

## 2011-02-12 LAB — URINALYSIS, ROUTINE W REFLEX MICROSCOPIC
Bilirubin Urine: NEGATIVE
Glucose, UA: NEGATIVE mg/dL
Hgb urine dipstick: NEGATIVE
Ketones, ur: NEGATIVE mg/dL
Nitrite: NEGATIVE
Protein, ur: NEGATIVE mg/dL
Specific Gravity, Urine: 1.017 (ref 1.005–1.030)
Urobilinogen, UA: 1 mg/dL (ref 0.0–1.0)
pH: 7 (ref 5.0–8.0)

## 2011-02-12 LAB — CBC
HCT: 37.4 % (ref 36.0–46.0)
Hemoglobin: 12.1 g/dL (ref 12.0–15.0)
MCHC: 32.4 g/dL (ref 30.0–36.0)
MCV: 83 fL (ref 78.0–100.0)
Platelets: 210 10*3/uL (ref 150–400)
RBC: 4.51 MIL/uL (ref 3.87–5.11)
RDW: 17.8 % — ABNORMAL HIGH (ref 11.5–15.5)
WBC: 13.3 10*3/uL — ABNORMAL HIGH (ref 4.0–10.5)

## 2011-02-12 LAB — URINE CULTURE: Colony Count: >=100000

## 2011-02-12 LAB — POCT I-STAT, CHEM 8
BUN: 4 mg/dL — ABNORMAL LOW (ref 6–23)
Calcium, Ion: 1.12 mmol/L (ref 1.12–1.32)
Chloride: 108 mEq/L (ref 96–112)
Creatinine, Ser: 0.8 mg/dL (ref 0.4–1.2)
Glucose, Bld: 92 mg/dL (ref 70–99)
HCT: 38 % (ref 36.0–46.0)
Hemoglobin: 12.9 g/dL (ref 12.0–15.0)
Potassium: 3.7 mEq/L (ref 3.5–5.1)
Sodium: 137 mEq/L (ref 135–145)
TCO2: 24 mmol/L (ref 0–100)

## 2011-02-12 LAB — DIFFERENTIAL
Basophils Absolute: 0 10*3/uL (ref 0.0–0.1)
Basophils Relative: 0 % (ref 0–1)
Eosinophils Absolute: 0 10*3/uL (ref 0.0–0.7)
Eosinophils Relative: 0 % (ref 0–5)
Lymphocytes Relative: 11 % — ABNORMAL LOW (ref 12–46)
Lymphs Abs: 1.4 10*3/uL (ref 0.7–4.0)
Monocytes Absolute: 0.8 10*3/uL (ref 0.1–1.0)
Monocytes Relative: 6 % (ref 3–12)
Neutro Abs: 11 10*3/uL — ABNORMAL HIGH (ref 1.7–7.7)
Neutrophils Relative %: 83 % — ABNORMAL HIGH (ref 43–77)

## 2011-03-01 LAB — CBC
HCT: 35.2 % — ABNORMAL LOW (ref 36.0–46.0)
HCT: 39.1 % (ref 36.0–46.0)
Hemoglobin: 11.7 g/dL — ABNORMAL LOW (ref 12.0–15.0)
Hemoglobin: 12.9 g/dL (ref 12.0–15.0)
MCHC: 33 g/dL (ref 30.0–36.0)
MCHC: 33.2 g/dL (ref 30.0–36.0)
MCV: 84.7 fL (ref 78.0–100.0)
MCV: 85.5 fL (ref 78.0–100.0)
Platelets: 302 10*3/uL (ref 150–400)
RBC: 4.16 MIL/uL (ref 3.87–5.11)
RBC: 4.58 MIL/uL (ref 3.87–5.11)
RDW: 14.9 % (ref 11.5–15.5)
RDW: 15.3 % (ref 11.5–15.5)
WBC: 12.6 10*3/uL — ABNORMAL HIGH (ref 4.0–10.5)
WBC: 16.8 10*3/uL — ABNORMAL HIGH (ref 4.0–10.5)

## 2011-03-01 LAB — DIFFERENTIAL
Basophils Absolute: 0.1 10*3/uL (ref 0.0–0.1)
Basophils Absolute: 0.1 10*3/uL (ref 0.0–0.1)
Basophils Relative: 1 % (ref 0–1)
Basophils Relative: 1 % (ref 0–1)
Eosinophils Absolute: 0.1 10*3/uL (ref 0.0–0.7)
Eosinophils Absolute: 0.1 10*3/uL (ref 0.0–0.7)
Eosinophils Relative: 1 % (ref 0–5)
Eosinophils Relative: 1 % (ref 0–5)
Lymphocytes Relative: 16 % (ref 12–46)
Lymphocytes Relative: 42 % (ref 12–46)
Lymphs Abs: 2.7 10*3/uL (ref 0.7–4.0)
Lymphs Abs: 5.3 10*3/uL — ABNORMAL HIGH (ref 0.7–4.0)
Monocytes Absolute: 0.7 10*3/uL (ref 0.1–1.0)
Monocytes Absolute: 0.8 10*3/uL (ref 0.1–1.0)
Monocytes Relative: 4 % (ref 3–12)
Monocytes Relative: 6 % (ref 3–12)
Neutro Abs: 13.2 10*3/uL — ABNORMAL HIGH (ref 1.7–7.7)
Neutro Abs: 6.3 10*3/uL (ref 1.7–7.7)
Neutrophils Relative %: 50 % (ref 43–77)
Neutrophils Relative %: 79 % — ABNORMAL HIGH (ref 43–77)
Smear Review: ADEQUATE

## 2011-03-01 LAB — COMPREHENSIVE METABOLIC PANEL
ALT: 12 U/L (ref 0–35)
AST: 19 U/L (ref 0–37)
Albumin: 3.8 g/dL (ref 3.5–5.2)
Alkaline Phosphatase: 90 U/L (ref 39–117)
BUN: 8 mg/dL (ref 6–23)
CO2: 19 mEq/L (ref 19–32)
Calcium: 9.3 mg/dL (ref 8.4–10.5)
Chloride: 106 mEq/L (ref 96–112)
Creatinine, Ser: 0.51 mg/dL (ref 0.4–1.2)
GFR calc Af Amer: >60 mL/min (ref >60)
GFR calc non Af Amer: >60 mL/min (ref >60)
Glucose, Bld: 106 mg/dL — ABNORMAL HIGH (ref 70–99)
Potassium: 3.7 mEq/L (ref 3.5–5.1)
Sodium: 135 mEq/L (ref 135–145)
Total Bilirubin: 0.5 mg/dL (ref 0.3–1.2)
Total Protein: 7.2 g/dL (ref 6.0–8.3)

## 2011-03-01 LAB — BASIC METABOLIC PANEL
BUN: 7 mg/dL (ref 6–23)
CO2: 25 mEq/L (ref 19–32)
Calcium: 9 mg/dL (ref 8.4–10.5)
Chloride: 106 mEq/L (ref 96–112)
Creatinine, Ser: 0.52 mg/dL (ref 0.4–1.2)
GFR calc Af Amer: >60 mL/min (ref >60)
GFR calc non Af Amer: >60 mL/min (ref >60)
Glucose, Bld: 91 mg/dL (ref 70–99)
Potassium: 3.7 mEq/L (ref 3.5–5.1)
Sodium: 137 mEq/L (ref 135–145)

## 2011-03-01 LAB — CULTURE, BLOOD (ROUTINE X 2)
Culture: NO GROWTH
Culture: NO GROWTH

## 2011-03-01 LAB — LIPID PANEL
Cholesterol: 208 mg/dL — ABNORMAL HIGH (ref 0–200)
HDL: 57 mg/dL (ref >39)
LDL Cholesterol: 130 mg/dL — ABNORMAL HIGH (ref 0–99)
Total CHOL/HDL Ratio: 3.6 RATIO
Triglycerides: 105 mg/dL (ref <150)
VLDL: 21 mg/dL (ref 0–40)

## 2011-03-01 LAB — CARDIAC PANEL(CRET KIN+CKTOT+MB+TROPI)
CK, MB: 0.6 ng/mL (ref 0.3–4.0)
Relative Index: INVALID (ref 0.0–2.5)
Total CK: 35 U/L (ref 7–177)
Troponin I: <0.01 ng/mL (ref 0.00–0.06)

## 2011-03-01 LAB — CK TOTAL AND CKMB (NOT AT ARMC)
CK, MB: 0.7 ng/mL (ref 0.3–4.0)
Relative Index: INVALID (ref 0.0–2.5)
Total CK: 37 U/L (ref 7–177)

## 2011-03-01 LAB — LIPASE, BLOOD: Lipase: 32 U/L (ref 11–59)

## 2011-03-01 LAB — TROPONIN I: Troponin I: <0.01 ng/mL (ref 0.00–0.06)

## 2011-03-01 LAB — TSH: TSH: 0.811 u[IU]/mL (ref 0.350–4.500)

## 2011-03-07 LAB — POCT I-STAT, CHEM 8
BUN: 9 mg/dL (ref 6–23)
Calcium, Ion: 1.18 mmol/L (ref 1.12–1.32)
Chloride: 108 mEq/L (ref 96–112)
Creatinine, Ser: 0.6 mg/dL (ref 0.4–1.2)
Glucose, Bld: 94 mg/dL (ref 70–99)
HCT: 36 % (ref 36.0–46.0)
Hemoglobin: 12.2 g/dL (ref 12.0–15.0)
Potassium: 3.8 mEq/L (ref 3.5–5.1)
Sodium: 139 mEq/L (ref 135–145)
TCO2: 22 mmol/L (ref 0–100)

## 2011-03-07 LAB — DIFFERENTIAL
Basophils Absolute: 0 10*3/uL (ref 0.0–0.1)
Basophils Relative: 0 % (ref 0–1)
Eosinophils Absolute: 0.1 10*3/uL (ref 0.0–0.7)
Eosinophils Relative: 1 % (ref 0–5)
Lymphocytes Relative: 20 % (ref 12–46)
Lymphs Abs: 2.5 10*3/uL (ref 0.7–4.0)
Monocytes Absolute: 0.7 10*3/uL (ref 0.1–1.0)
Monocytes Relative: 5 % (ref 3–12)
Neutro Abs: 9.4 10*3/uL — ABNORMAL HIGH (ref 1.7–7.7)
Neutrophils Relative %: 74 % (ref 43–77)

## 2011-03-07 LAB — POCT PREGNANCY, URINE: Preg Test, Ur: NEGATIVE

## 2011-03-07 LAB — CBC
HCT: 35.7 % — ABNORMAL LOW (ref 36.0–46.0)
Hemoglobin: 11.9 g/dL — ABNORMAL LOW (ref 12.0–15.0)
MCHC: 33.2 g/dL (ref 30.0–36.0)
MCV: 85.6 fL (ref 78.0–100.0)
Platelets: 274 10*3/uL (ref 150–400)
RBC: 4.17 MIL/uL (ref 3.87–5.11)
RDW: 15.9 % — ABNORMAL HIGH (ref 11.5–15.5)
WBC: 12.7 10*3/uL — ABNORMAL HIGH (ref 4.0–10.5)

## 2011-03-16 ENCOUNTER — Ambulatory Visit (INDEPENDENT_AMBULATORY_CARE_PROVIDER_SITE_OTHER): Payer: Medicaid Other | Admitting: Family Medicine

## 2011-03-16 ENCOUNTER — Encounter: Payer: Self-pay | Admitting: Family Medicine

## 2011-03-16 VITALS — BP 121/82 | HR 79 | Temp 97.6°F | Ht 63.0 in | Wt 165.5 lb

## 2011-03-16 DIAGNOSIS — M549 Dorsalgia, unspecified: Secondary | ICD-10-CM

## 2011-03-16 DIAGNOSIS — G8929 Other chronic pain: Secondary | ICD-10-CM | POA: Insufficient documentation

## 2011-03-16 MED ORDER — TRAMADOL HCL 50 MG PO TABS: 50.0000 mg | ORAL_TABLET | Freq: Four times a day (QID) | ORAL | Status: DC | PRN

## 2011-03-16 MED ORDER — LORAZEPAM 1 MG PO TABS: ORAL_TABLET | ORAL | Status: DC

## 2011-03-16 MED ORDER — CYCLOBENZAPRINE HCL 5 MG PO TABS: 5.0000 mg | ORAL_TABLET | Freq: Three times a day (TID) | ORAL | Status: DC | PRN

## 2011-03-16 MED ORDER — PREDNISONE 10 MG PO TABS: ORAL_TABLET | ORAL | Status: DC

## 2011-03-16 NOTE — Patient Instructions (Signed)
Take the prednisone for inflammation.  Stop the ibuprofen Use the muscle relaxant at bedtime and the pain medication Avoid lifting anything over 10lbs Follow-up in 1week

## 2011-03-17 NOTE — Progress Notes (Signed)
  Subjective:    Patient ID: Sheri Rivera, female    DOB: 21-Apr-1961, 50 y.o.   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 Pain mostly on right side and low back. Feels better with sitting and resting. Has been taking many ibuprofen though she knows she has gastritis and history of GI bleed. Has been out of work past few days because of pain. No change in bowel or bladder. Per pt history of chronic back pain, also told last time she was hurt it was muscles.   Needs Refill on Lorazepam  Review of Systems  Per above     Objective:   Physical Exam  GEN- uncomfortable appearing, antalgic gait, getting up onto table  Back- TTP right lumbar region and paraspinals, spine non tender   Neg log roll    NEg SLR  ABle to squat without significant pain  limited flexion, limited extension Pelvic alignment unremarkable to inspection and palpation. No SI joint tenderness and normal minimal SI movement. 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 Likely a strain after starting new job with heavy lifting pt not accustom to. Will treat with Prednisone burst, as she has gastritis, history of GI bleed and has been taking too many NSAIDS, Ultram for pain- note pt did not ask for narcotics. Muscle relaxant  Light duty  RTC 1 week for recheck. No imaging  today

## 2011-03-22 ENCOUNTER — Ambulatory Visit: Payer: Medicaid Other | Admitting: Family Medicine

## 2011-03-29 ENCOUNTER — Ambulatory Visit (INDEPENDENT_AMBULATORY_CARE_PROVIDER_SITE_OTHER): Payer: Medicaid Other | Admitting: Family Medicine

## 2011-03-29 ENCOUNTER — Encounter: Payer: Self-pay | Admitting: Family Medicine

## 2011-03-29 VITALS — BP 117/76 | HR 78 | Temp 98.1°F | Ht 63.0 in | Wt 165.5 lb

## 2011-03-29 DIAGNOSIS — M549 Dorsalgia, unspecified: Secondary | ICD-10-CM

## 2011-03-29 MED ORDER — CALCIUM CARBONATE-VITAMIN D 500-200 MG-UNIT PO TABS: 1.0000 | ORAL_TABLET | Freq: Every day | ORAL | Status: DC

## 2011-03-29 NOTE — Patient Instructions (Signed)
It was nice to see you today.  I am referring you to physical therapy.   I have added calcium plus vitamin D to your medication list.  Follow-up in 6-8 weeks if you are not improving.

## 2011-03-29 NOTE — Progress Notes (Signed)
  Subjective:    Patient ID: Sheri Rivera, female    DOB: 17-Jun-1961, 50 y.o.   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 Pain mostly on right side and low back. Feels better with sitting and resting. Has been taking many ibuprofen though she knows she has gastritis and history of GI bleed. Has been out of work past few days because of pain. No change in bowel or bladder. Per pt history of chronic back pain, also told last time she was hurt it was muscles.  TODAY: Now s/p treatment with Prednisone. Took Ultram x 1 and felt dizzy, so went back to Motrin 800 mg po daily with no issues. Flexeril prn at night. Feeling a little better. 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  Review of Systems  Musculoskeletal: Positive for back pain.    Per above    Objective:   Physical Exam   GEN- uncomfortable appearing, antalgic gait, getting up onto table  Back- TTP right lumbar region and paraspinals, spine non tender   Neg log roll    NEg SLR  ABle to squat without significant pain Pelvic alignment unremarkable to inspection and palpation. No SI joint tenderness and normal minimal SI movement. Neuro:  Symmetric.  Sensation grossly in tact  DTR -symmetric bilat   Gait- able to walk on heels and toes, very slowly  - Radial pulses 2+   Skin- Normal,no bruising noted on lower ext   Assessment & Plan:   1. Back pain - acute back injury 2/2 strain. 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 Continue current treatment per Dr. Jeanice Lim. Will add PT for low back pain. Light duty RTC in 6-8 weeks for recheck if not improving. No imaging today

## 2011-04-02 NOTE — Assessment & Plan Note (Signed)
See above A/P 

## 2011-04-10 NOTE — Consult Note (Signed)
NAME:  Sheri Rivera, Sheri Rivera              ACCOUNT NO.:  [id number]   MEDICAL RECORD NO.:  [id number]          PATIENT TYPE:  INP   LOCATION:  1618                         FACILITY:  [facility]   PHYSICIAN:  Adolph Pollack, M.D.DATE OF BIRTH:  05/30/1961   DATE OF CONSULTATION:  01/26/2008  DATE OF DISCHARGE:                                 CONSULTATION   GENERAL SURGICAL CONSULTATION   REFERRING PHYSICIAN:  Marcellus Scott, M.D.   REASON FOR CONSULTATION:  Dysphagia.   HISTORY OF PRESENT ILLNESS:  Sheri Rivera is a 50 year old female who  has a known hiatal hernia and gastroesophageal reflux.  Her  gastroesophageal reflux has been complicated by strictures which have  had to be dilated.  She was admitted January 25, 2008 with a progressive  history of dysphagia to solids and liquids.  She states she has lost  about 8 pounds.  She had some epigastric and chest discomfort as well.  She had been seen about one year earlier by Dr. Baruch Merl and they  had discussed antireflux surgery, however, at that time, the patient was  unable to afford the operation and thus deferred.  She has had a recent  upper endoscopy by Dr. Ewing Schlein that showed a moderate sized hernia.  We re  asked to see her because of her recurring symptoms.  Currently she  states she takes a proton pump inhibitor twice a day as well as Maalox  but continues to have breakthrough reflux symptoms and has a chronic  cough.   PAST MEDICAL HISTORY:  1. Gastroesophageal reflux disease with hiatal hernia.  2. Esophageal strictures secondary to above.  3. Chronic obstructive pulmonary disease.  4. History of anxiety disorder.  5. Fibromyalgia.  6. Thyroid nodule.   PAST SURGICAL HISTORY:  1. Bilateral tubal ligation.  2. Cholecystectomy.   ALLERGIES:  SULFA.  Also ALLERGIC to SHELLFISH.   HOME MEDICATIONS:  Protonix, albuterol inhaler, Advair, Ativan.   SOCIAL HISTORY:  She is divorced and has a 12 year old son.  She  continues  to smoke cigarettes but denies alcohol use.   REVIEW OF SYSTEMS:  CARDIOVASCULAR:  She has some lower chest pain which  is felt to be secondary to hiatal hernia.  PULMONARY:  She has a chronic  cough and has had occurrences of recurrent community acquired  pneumonias.  GI:  She has alternating diarrhea and constipation, gas and  bloating as well.   PHYSICAL EXAMINATION:  GENERAL:  Well-developed, well-nourished female,  slightly anxious.  VITAL SIGNS:  She is afebrile.  Blood pressure 130/73.  Pulse 92.  HEENT:  Extraocular motions intact.  No icterus.  RESPIRATORY:  Breath sounds are distant but equal.  CARDIOVASCULAR:  Regular rate and rhythm with no murmurs or JVD.  ABDOMEN:  Soft with well-healed small scars noted.  There is mild  epigastric tenderness but no masses, no organomegaly present.  MUSCULOSKELETAL:  She has no edema.  Feet are warm.  She has good muscle  tone.   LABORATORY DATA:  CBC is normal.  Albumin is 3.6.   IMPRESSION:  Progressive dysphagia in lady with hiatal  hernia and  medically refractory gastroesophageal reflux disease.  I think there is  a good chance she has recurrent stricture and may need a dilatation.  An  upper GI has been ordered.   PLAN:  I agree with getting the upper GI and see if there is any sort of  stricture that can be dilated.  If this is present I think she should  undergo dilatation and then see one of [place] in the office, which could be  Dr. Colin Benton, Dr. Derrell Lolling, myself or another one of [place] who does laparoscopic  hiatal hernia repair and Nissen fundoplication to schedule elective  surgery.  I think she does need to get some nutrition to try to reverse  her weight loss prior to any major surgery and I have explained this to  her.      Adolph Pollack, M.D.  Electronically Signed     TJR/MEDQ  D:  01/26/2008  T:  01/26/2008  Job:  [id number]   cc:   Shirley Friar, MD  Fax: [phone]   Petra Kuba, M.D.  Fax: [phone]    Marcellus Scott, MD

## 2011-04-10 NOTE — H&P (Signed)
NAME:  Sheri Rivera, Sheri Rivera NO.:  [id number]   MEDICAL RECORD NO.:  [id number]          PATIENT TYPE:  INP   LOCATION:  1618                         FACILITY:  [facility]   PHYSICIAN:  Lauralee Evener, MDDATE OF BIRTH:  04-23-1961   DATE OF ADMISSION:  01/26/2008  DATE OF DISCHARGE:                              HISTORY & PHYSICAL   CHIEF COMPLAINT:  Dysphagia.   HISTORY OF PRESENT ILLNESS:  Sheri Rivera is a 50 year old woman with a  long history of dysphagia secondary to a hiatal hernia and multiple  hospital visits for this problem who presented to the [facility]  emergency department this evening with worsening symptoms. She says that  over the last 5 days the dysphagia has increased to the point where she  is unable to get any solids or liquids down. She says that she vomits  them immediately. This has resulted in an 8 pound weight loss over the  last week. She also has had significant fatigue. She has severe acid  reflux symptoms and is concerned she is aspirating some of the food. She  has significant abdominal pain. She denies fever or symptoms of  infection. Her urine is very concentrated and her mouth is dry.   REVIEW OF SYSTEMS:  A comprehensive 10-point review of systems was  obtained and was negative except as per HPI.   PAST MEDICAL HISTORY:  Hiatal hernia, anxiety, COPD, thyroid nodule,  fibromyalgia, cholecystectomy, bilateral tubal ligation.   SOCIAL HISTORY:  She lives in [place] with her daughter and a  roommate. She drives limousines. The work has been slow lately. She  smoked a half pack of cigarettes a day for 20 years. She denies alcohol  or illicit drug use. She divorced a husband who was a drug abuser.   FAMILY HISTORY:  Mother with lung cancer.   ALLERGIES:  SHELLFISH and SULFA causes anaphylaxis.   MEDICATIONS:  1. Ativan 0.5 mg daily.  2. Pantoprazole 40 mg twice a day.  3. Advair 200/50 twice a day.  4. Albuterol inhaler as  needed.   PHYSICAL EXAMINATION:  VITAL SIGNS:  Temperature 97.8, pulse 80,  respiratory rate 18, O2 sat 95% on room air.  Blood pressure 123/85.  GENERAL:  Well-nourished, well-developed woman in moderate discomfort  but no acute distress.  HEENT:  Sclera anicteric, oropharynx clear.  NECK:  Supple, no lymphadenopathy.  HEART:  Regular, no murmurs.  LUNGS:  Clear to auscultation bilaterally.  ABDOMEN:  With moderate diffuse tenderness with increase in epigastric  region. No guarding or rebound.  EXTREMITIES:  Warm with no edema.  NEUROLOGIC:  No focal deficits.  MUSCULOSKELETAL:  No bone pain palpated.   DATA REVIEW:  1. Abdominal film January 25, 2008 chronic intestinal changes without      acute cardiopulmonary process. Nonspecific bowel gas pattern.  2. Barium swallow January 08, 2008, report not available.  3. White count 8.9, hemoglobin 13.1, platelets 329. Chemistries within      normal limits.   ASSESSMENT/PLAN:  Sheri Rivera is a 50 year old woman with severe  dysphagia to solids and with liquids secondary  to a known hiatal hernia.  Sheri Rivera is also having severe pain, weight loss and fatigue as a  result of this issue. We will therefore admit her to the hospitalist  service for further management. We will hydrate her with normal saline  and keep her n.p.o. other than ice chips. We will control her pain with  IV Dilaudid. Sheri Rivera requests to use these minimally. We will have  her evaluated by the general surgery team tomorrow. She has been  evaluated by Dr. Arbie Cookey in the past but has been unable to proceed with  surgical intervention due to financial constraints.   We will continue Sheri Rivera's treatment for her asthma/chronic  obstructive pulmonary disease with Advair and p.r.n. albuterol.      Lauralee Evener, MD  Electronically Signed     WT/MEDQ  D:  01/26/2008  T:  01/26/2008  Job:  [id number]

## 2011-04-10 NOTE — Op Note (Signed)
NAME:  Sheri Rivera, Sheri Rivera              ACCOUNT NO.:  [id number]   MEDICAL RECORD NO.:  [id number]          PATIENT TYPE:  AMB   LOCATION:  DAY                          FACILITY:  [facility]   PHYSICIAN:  Adolph Pollack, M.D.DATE OF BIRTH:  [birth year]   DATE OF PROCEDURE:  06/17/2008  DATE OF DISCHARGE:                               OPERATIVE REPORT   PREOPERATIVE DIAGNOSIS:  Hiatal hernia and gastroesophageal reflux  disease.   POSTOPERATIVE DIAGNOSIS:  Hiatal hernia and gastroesophageal reflux  disease.   PROCEDURE:  Laparoscopic hiatal hernia repair with Nissen  fundoplication.   SURGEON:  Adolph Pollack, M.D.   ASSISTANT:  Leonie Man, M.D.   ANESTHESIA:  General.   INDICATION:  This is a 50 year old female who has had symptomatic hiatal  hernia and gastroesophageal reflux disease for a number of years.  She  has had appropriate preop workup and now presents for repair.   PROCEDURE IN DETAIL:  She was brought to the operating room and placed  supine on the operating table.  General anesthetic was administered.  Foley catheter placed in the bladder.  Abdominal wall was sterilely  prepped and draped.  In the left upper quadrant Marcaine was infiltrated  and a small incision made in the left upper quadrant.  Using a 5 mm  OptiView trocar access was gained to the peritoneal cavity.  Pneumoperitoneum was created.  The area underneath the trocar was  inspected and there was no evidence of injury to the intra-abdominal  organs and there was no bleeding.  Following this a 10 mm trocar was  placed to the left of the midline just above the umbilicus.  Another 10  mm trocar was placed in the right upper quadrant and one in the  epigastrium.  The 5 mm trocar was then removed from the left upper  quadrant and replaced with a 10 mm trocar.  Angled laparoscope was used.  I made a 5 mm incision in the subxiphoid region and used a Nathanson  retractor to retract the left lobe of the  liver anteriorly exposing the  GE junction and hiatus.  I then began dividing the thin gastrohepatic  ligament with Harmonic scalpel until the right crus was identified.  The  anterior phrenoesophageal ligament was then divided with Harmonic  scalpel exposing the left crus.   Using careful blunt dissection I created a partial retroesophageal  window, keeping the vagus nerve with the esophagus.  I then approached  the short gastric vessels and beginning in the mid fundus divided the  short gastric vessels between clips and using the Harmonic scalpel  completely mobilizing the fundus around and the angle of His and cardia.  After this was done I completed the retroesophageal window using blunt  dissection and passed a Penrose drain through it.   The Penrose drain was used to retract the gastroesophageal junction  anteriorly and expose a small hiatal hernia.  I then repaired the hiatal  hernia with two pledgeted size zero nonabsorbable sutures which allowed  for good approximation and good closure.   Following this I then brought part of  the fundus through the  retroesophageal window and fashioned a 360 degree wrap using the  shoeshine maneuver to confirm I had appropriate spots on the fundus.  A  size 56 lighted bougie was then passed down the esophagus into the  stomach.  A 360 degree fundoplication was then performed.  The first  suture was performed with zero nonabsorbable sutures.  The first two  sutures incorporated in the left leaf of the wrap, small bite of  esophagus and the right leaf of the wrap.  The third suture incorporated  just the left and right leaves of the wrap.  The wrap measured 2 to 2.5  cm in length.  The bougie was removed intact.   The wrap was floppy and sitting in approximately the 11 o'clock  position.  A clip was placed around the second suture of the wrap for  further indication in the future if needed.   I then inspected the area around the spleen,  liver, stomach and  esophagus.  No esophageal or gastric injuries noted.  No bleeding was  noted.  The liver retractor was then removed.  The CO2 gas was released  and the remaining trocars were removed.   The skin incisions were then closed with 4-0 Monocryl subcuticular  stitches.  Steri-Strips and sterile dressings were applied.  She  tolerated the procedure well without any apparent complications and was  taken to recovery in satisfactory condition.      Adolph Pollack, M.D.  Electronically Signed     TJR/MEDQ  D:  06/17/2008  T:  06/17/2008  Job:  [id number]   cc:   Shirley Friar, MD  Fax: [phone]   [facility]

## 2011-04-10 NOTE — Consult Note (Signed)
NAME:  Sheri Rivera, Sheri Rivera              ACCOUNT NO.:  [id number]   MEDICAL RECORD NO.:  [id number]          PATIENT TYPE:  INP   LOCATION:  1618                         FACILITY:  [facility]   PHYSICIAN:  Shirley Friar, MDDATE OF BIRTH:  1961-10-15   DATE OF CONSULTATION:  01/26/2008  DATE OF DISCHARGE:  01/26/2008                                 CONSULTATION   REFERRING PHYSICIAN:  Marcellus Scott, MD.   INDICATIONS:  Dysphagia.   HISTORY OF PRESENT ILLNESS:  Sheri Rivera is a 46-year white female who  has had a previous extensive workup several years ago with Dr. Ewing Schlein for  dysphagia.  She was found to have a moderate hiatal hernia and found to  be a candidate for a Nissen fundoplication, but she did not get it done  due to her financial situation.  Reportedly, there was no primary  esophageal motility problem on a previous manometry study that was done.  She reports having a speech pathology study done on February 12, but the  results are not available at the time of this dictation.  She presented  to the emergency room yesterday with inability to eat x3 days and unable  to keep liquids down x1 day.  She felt dehydrated, weak, and was told to  come to the emergency department for further evaluation.  She does  report chest pain and epigastric pain when she eats.  She has been  crushing up her Protonix pills but has felt like she has been unable to  swallow those recently as well.   PAST MEDICAL HISTORY:  1. History of anxiety.  2. COPD.  3. Fibromyalgia.  4. Cholecystectomy.  5. Status post bilateral tubal ligation.  6. Hiatal hernia as stated above.   MEDICATIONS ON ADMISSION:  Ativan, Protonix, Advair, albuterol.   ALLERGIES:  SULFA DRUGS, SHELLFISH.   PHYSICAL EXAMINATION:  VITAL SIGNS:  Temperature 97.9, pulse 92, blood  pressure 113/73, O2 saturation 91% on 2 liters.  GENERAL:  Sleeping upon me entering the room.  Upon arousal, alert, no  acute distress.  ABDOMEN:   Epigastric tenderness without guarding, otherwise nontender,  soft, nontender, positive bowel sounds.   LABORATORY DATA:  White blood count 8.9, hemoglobin 13.1, platelet count  329. Lipase 25. All other labs as listed in the hospital chart.   IMPRESSION:  1. A 46-year white female with chronic dysphagia that I think is all      from her hiatal hernia.  I doubt there is any esophageal stricture      or motility disorder accompanying this hiatal hernia.  Would do a      barium swallow plus upper GI series to evaluate for any esophageal      spasm and/or look for a stricture, but mainly to assess the size      of the hernia.  2. No indication for upper endoscopy at this time.  3. Agree with surgical input to see whether or not to pursue      fundoplication at this time.      Shirley Friar, MD  Electronically Signed  VCS/MEDQ  D:  01/26/2008  T:  01/26/2008  Job:  [id number]   cc:   Marcellus Scott, MD   Petra Kuba, M.D.  Fax: [phone]   Alfonse Ras, MD  [address]  [place]  [place] [address]

## 2011-04-10 NOTE — H&P (Signed)
NAME:  Sheri Rivera, UNGERER              ACCOUNT NO.:  [id number]   MEDICAL RECORD NO.:  [id number]          PATIENT TYPE:  AMB   LOCATION:  DAY                          FACILITY:  [facility]   PHYSICIAN:  Adolph Pollack, M.D.DATE OF BIRTH:  [birth year]   DATE OF ADMISSION:  06/17/2008  DATE OF DISCHARGE:                              HISTORY & PHYSICAL   REASON FOR ADMISSION:  Elective hiatal hernia repair and Nissen  fundoplication.   HISTORY OF PRESENT ILLNESS:  This is a 50 year old female who has a  known small to moderate hiatal hernia with reflux.  She has had  appropriate preoperative evaluation.  She has had some history of  strictures.  She is on twice a day proton pump inhibitors.  She now  presents for elective laparoscopic hiatal hernia repair and Nissen  fundoplication.   PAST MEDICAL HISTORY:  1. Gastroesophageal reflux disease, hiatal hernia.  2. Esophageal strictures.  3. Chronic obstructive pulmonary disease.  4. History of anxiety disorder.  5. Myalgia.  6. Thyroid nodule.   PREVIOUS OPERATIONS:  1. Bilateral tubal ligation.  2. Cholecystectomy.   ALLERGIES:  Sulfa medicines.  Vicodin causes nausea and sweating.  She  does not like the way morphine makes her feel.  She has allergies to  shellfish and iodine.   SOCIAL HISTORY:  She smokes a pack of cigarettes a day.  Denies alcohol  use.  She is single.   REVIEW OF SYSTEMS:  She had an upper respiratory tract infection 2 weeks  ago, but that has resolved.   PHYSICAL EXAMINATION:  General:  A well-developed, well-nourished  female.  She is in no acute distress, pleasant and cooperative.  Vital  Signs: Temperature is 97.3, blood pressure 140/87, pulse 94, O2  saturation 97% on room air.  Eyes:  Extraocular motions are intact.  No  icterus.  Neck:  Supple without masses.  Respiratory:  Breath sounds are  equal and clear, respirations unlabored.  Cardiovascular:  Regular rate,  regular rhythm, no murmur.  Abdomen:   Soft with small subumbilical scar  and small upper abdominal scars.  No palpable masses or organomegaly.  Extremities:  Have SCDs on.   IMPRESSION:  Hiatal hernia with gastroesophageal reflux disease.   PLAN:  Laparoscopic hiatal hernia repair and Nissen fundoplication.  Procedure, risks, and aftercare have been explained to her extensively  preoperatively.      Adolph Pollack, M.D.  Electronically Signed     TJR/MEDQ  D:  06/17/2008  T:  06/17/2008  Job:  [id number]

## 2011-04-13 NOTE — Op Note (Signed)
NAME:  Sheri Rivera, Sheri Rivera              ACCOUNT NO.:  [id number]   MEDICAL RECORD NO.:  [id number]          PATIENT TYPE:  AMB   LOCATION:  ENDO                         FACILITY:  [facility]   PHYSICIAN:  Petra Kuba, M.D.    DATE OF BIRTH:  Jan 19, 1961   DATE OF PROCEDURE:  08/31/2004  DATE OF DISCHARGE:                                 OPERATIVE REPORT   PROCEDURE:  EGD with Savary dilatation.   INDICATION:  The patient with worsening dysphagia with known hiatal hernia.  Consent was signed after risks, benefits, methods, options thoroughly  discussed multiple times in the past.   MEDICINES USED:  1.  Demerol 60.  2.  Versed 6.   DESCRIPTION OF PROCEDURE:  The video endoscope was inserted by direct  vision.  The proximal, mid esophagus was normal.  In the distal esophagus  was a moderate-sized hiatal hernia with a widely patent, thin, fibrous ring.  The scope passed easily through this area and advanced through a normal  antrum, normal pylorus, into a normal duodenal bulb and around the C-loop to  a normal second portion of the duodenum.  The scope was withdrawn back to  the bulb, and a good look there ruled out any abnormalities in that  location.  The scope was withdrawn back to the stomach and retroflexed.  High in the cardia, the hiatal hernia was confirmed.  The fundus, angularis,  lesser and greater curve were all normal on retroflexed visualization.  Straight visualization of the stomach did not reveal any additional  findings.  The scope was then slowly withdrawn back to about 18 cm, again  confirming the normal esophagus and the hiatal hernia.  The scope was  readvanced to the antrum.  Savary wire was advanced.  Customary J-loop of  the wire was confirmed, both endoscopically and under fluoroscopy.  The  scope was removed making sure to keep the wire in the proper location in the  stomach and once the scope was removed, the Savary 16 mm dilator was  advanced.  There was no  resistance in passing the dilator.  The dilator was  confirmed in the proper position in the stomach.  The wire was withdrawn  back into the dilator.  Both were removed in tandem.  There was just a trace  of blood on the dilator.  The patient tolerated the procedure well.  There  was no obvious immediate complication.   ENDOSCOPIC DIAGNOSES:  1.  Medium-large hiatal hernia with a thin, fibrous ring.  2.  Otherwise normal EGD.   THERAPY:  Savary dilatation under fluoroscopy to 16 mm.   PLAN:  See how the dilation worked.  Follow up p.r.n. or in 2 months to  recheck symptoms.  Probably set her up with a surgeon and a manometry to  complete her preop evaluation.  Continue pump inhibitors in the meantime.  May want to try changing pump inhibitors if that had not been done.      MEM/MEDQ  D:  08/31/2004  T:  08/31/2004  Job:  [id number]   cc:   Enid Cutter.  [facility]

## 2011-04-13 NOTE — Op Note (Signed)
NAME:  Sheri Rivera, UEHARA NO.:  [id number]   MEDICAL RECORD NO.:  [id number]          PATIENT TYPE:  AMB   LOCATION:  ENDO                         FACILITY:  [facility]   PHYSICIAN:  Petra Kuba, M.D.    DATE OF BIRTH:  10/16/1961   DATE OF PROCEDURE:  12/16/2006  DATE OF DISCHARGE:                               OPERATIVE REPORT   SURGEON:  Petra Kuba, M.D.   PROCEDURE:  Esophagogastroduodenoscopy.   INDICATIONS:  Patient with increasing dysphagia, worried about a food  impaction.   CONSENT:  Consent was signed after risks, benefits, methods and options  were thoroughly discussed prior to any sedation.   MEDICINES USED:  Fentanyl 50 mcg, Versed 5 mg.   DESCRIPTION OF PROCEDURE:  The video endoscope was inserted by direct  vision.  The proximal and midesophagus was normal.  In the distal  esophagus was a moderate-size hiatal hernia with a widely patent fibrous  ring, but no food was seen or any inflammation, ulcers, etc.  The scope  passed into the stomach and advanced through a normal antrum, normal  pylorus into a normal duodenal bulb, and around the C loop to a normal  second portion of the duodenum.  The scope was withdrawn back to the  bulb, and a good look there ruled out abnormalities in that location.  The scope was withdrawn back to the stomach and retroflexed.  The  cardia, fundus, angularis, lesser and greater curve were normal on  retroflex visualization.  Straight visualization of the stomach did not  reveal any additional findings.  Again, the scope was slowly withdrawn,  confirming a normal esophagus.  We did readvance 1 more time into the  stomach, again confirming the above findings.  The air was suctioned and  the scope removed.  The patient tolerated the procedure well.  There was  no obvious immediate complication.   ENDOSCOPIC DIAGNOSES:  1. Moderate hiatal hernia with a widely patent fibrous ring.  2. Otherwise, normal  esophagogastroduodenoscopy.   PLAN:  1. Continue pump inhibitors.  2. Happy to see back p.r.n., otherwise, await Dr. Tami Lin followup      next week to decide surgical options.           ______________________________  Petra Kuba, M.D.     MEM/MEDQ  D:  12/16/2006  T:  12/16/2006  Job:  [id number]   cc:   Alfonse Ras, MD

## 2011-04-13 NOTE — Op Note (Signed)
NAME:  Sheri Rivera, Sheri Rivera              ACCOUNT NO.:  [id number]   MEDICAL RECORD NO.:  [id number]          PATIENT TYPE:  AMB   LOCATION:  ENDO                         FACILITY:  [facility]   PHYSICIAN:  Petra Kuba, M.D.    DATE OF BIRTH:  1961/02/01   DATE OF PROCEDURE:  DATE OF DISCHARGE:  02/27/2005                                 OPERATIVE REPORT   OPERATIVE PROCEDURE:  Manometry interpretation.   SURGEON:  Petra Kuba, M.D.   INDICATIONS FOR PROCEDURE:  Patient with longstanding upper tract symptoms,  want to evaluate for possible surgical options.   PROCEDURE:  The procedure was done by the manometry nurse on February 27, 2005,  in the customary fashion, and the computer-generated report was given to me  for my interpretation.   MANOMETRY REPORT #1:  1.  Low esophageal sphincter.  The pressures were low normal and had normal      relaxation.   1.  Esophageal body      1.  Lower.  Slight low amplitude in the proximal and mid lower          esophagus, normal distal with normal duration, slightly low          velocities and normal peristalsis and waveforms.      2.  Upper.  The mid and distal were slightly low amplitude, normal          amplitude proximally with normal duration and normal waveforms.   1.  Upper esophageal sphincter.  Low normal pressures, slight increased      residual pressures, 82% relaxation, slightly decreased duration,      pharyngeal had a slight decreased peak pressure with normal duration and      peristalsis.   IMPRESSION:  Some very minimal abnormalities but overall okay for surgical  options.   PLAN:  We will discuss the above with her but probably proceed with surgical  consultation with further workup and plans for possible hiatal hernia repair  pending their opinion.      MEM/MEDQ  D:  03/05/2005  T:  03/05/2005  Job:  [id number]

## 2011-04-13 NOTE — Cardiovascular Report (Signed)
[facility]  Patient:    Sheri Rivera Visit Number: [id number] MRN: [id number]          Service Type: CAT Location: [facility] [id number] Attending Physician:  Pamella Pert Dictated by:   Delrae Rend, M.D. Proc. Date: 08/05/01 Admit Date:  08/05/2001   CC:         Kern Reap, M.D.  Petra Kuba, M.D.  [facility]   Cardiac Catheterization  PROCEDURE: 1. Selective left ventriculography. 2. Selective left and right coronary arteriography. 3. Right femoral arteriography. 4. Closure of right femoral access site using 6-French Perclose device.  CARDIOLOGIST:  Delrae Rend, M.D.  INDICATIONS:  Sheri Rivera is a 50 year old female with a strong family history of premature coronary artery disease with history of bronchial asthma and COPD.  She presents with chest pain.  She has had multiple emergency room visits (3 ER visits in the last six months).  Given repeated ER admissions and family history of premature coronary artery disease, she was brought to the cardiac catheterization lab to evaluate her coronary anatomy.  HEMODYNAMIC DATA:  Left ventricle pressures were 132/12 with an end-diastolic pressure of 13 mmHg.  The central aortic pressures were 132/85 with a mean of 107 mmHg. There was no pressure gradient across the aortic valve.  ANGIOGRAPHIC DATA:  Left ventricle: The left ventricular systolic function is well preserved. Ejection fraction is estimated at 60%.  There is no wall motion abnormality. There is no mitral regurgitation noted.  CORONARY ARTERIOGRAPHY:  Left Main Coronary Artery:  The left main coronary artery is a large caliber vessel.  It is very short and immediately bifurcates into left anterior descending artery and circumflex coronary artery.  Circumflex Coronary Artery: The circumflex coronary artery is a large caliber vessel.  It is a dominant vessel.  It gives origin to a  very large obtuse marginal #1 and continues as obtuse marginal #2, and gives several PDA branches.  The circumflex itself ends in the A-V groove.  It is disease free.  Left Anterior Descending Artery: The left anterior descending artery is a large caliber vessel.  It gives origin to a very large septal perforator #1. The left anterior descending artery distally wraps around the apex.  It gives origin to two small diagonal branches.  It is disease free.  Right Coronary Artery:  The right coronary artery is a nondominant vessel.  It gives origin to a moderate size RV branch.  ASCENDING AORTOGRAM:  The ascending aortogram revealed evidence of three aortic valve cusps.  There is no evidence of aortic vegetation or evidence of aortic dissection.  RIGHT FEMORAL ARTERIOGRAPHY revealed good arterial access site.  IMPRESSION: 1. Normal left ventricular systolic function. 2. Normal coronary arteries, left dominant. 3. Normal ascending root aortogram.  RECOMMENDATIONS:  Based on the cardiac catheterization, her chest pain is of noncardiac etiology.  The patient has history of gastroesophageal reflux disease and hiatal hernia.  This could be contributing to her chest pain. Hence, noncardiac causes of chest pain is indicated.  I see no contraindication for her to undergo surgery for her hiatal hernia. She can be taken up with acceptable risk of cardiovascular morbidity and mortality.  TECHNIQUE OF PROCEDURE:  In the the usual sterile precautions using 6-French right femoral artery access, a 6-French multipurpose B2 catheter was advanced into the ascending aorta over the 0.035 mm J wire.  The catheter was gently advanced into the left ventricle and left ventricle  pressures were monitored. Hand contrast injection of left ventricle was performed in the RAO projection. The catheter was then flushed with saline, pulled back into the ascending aorta, and pressure gradient across the aortic valve  was monitored.  Then the right coronary artery was selectively engaged, and arteriography was performed.  In a similar fashion, the left main coronary artery was selectively engaged, and arteriography was performed.  Then the catheter was pulled back into the aortic root, and ascending aortic root injection was performed in the LAO projection.  The catheter was then pulled out of the body in the usual fashion.  Right femoral arteriography was performed in RAO projection.  The arterial access site was then closed with Perclose device, and adequate hemostasis was obtained.  The patient was transferred to the floor in stable condition. Dictated by:   Delrae Rend, M.D. Attending Physician:  Pamella Pert DD:  08/05/01 TD:  08/05/01 Job: [id number]

## 2011-04-13 NOTE — Consult Note (Signed)
[facility]  Patient:    Sheri Rivera, Sheri Rivera Visit Number: [id number] MRN: [id number]          Service Type: EMS Location: ED Attending Physician:  Donnetta Hutching Dictated by:   Kern Reap, M.D. Proc. Date: 07/23/01 Adm. Date:  07/23/2001 Disc. Date: 07/23/2001   CC:         Delrae Rend, M.D.   Consultation Report  DATE OF BIRTH:  1961-03-31  CONSULTING PHYSICIAN:  Emergency room physician.  CHIEF COMPLAINT:  Chest and shoulder pain.  HISTORY OF PRESENT ILLNESS:  This is a 50 year old female who reported some chest discomfort developing yesterday associated with some gas, similar to her reflux in the past.  She had some belching with it which eventually did subside with the use of her Prevacid.  Later that day she developed some left shoulder pain that was described as a catching sensation, and it is still hurting today.  She did take some ibuprofen last night with some minimal relief.  The patient also reports a history of panic attacks, and she has been under an incredible amount of stress of late with an ADHD child at home, marital stress, and financial situation.  Her chest pain was not associated with shortness of breath, nausea, vomiting.  It did not radiate.  REVIEW OF SYSTEMS:  Positive for anterior chest pain.  Positive for reflux, gas, belching.  Positive for left shoulder pain.  No shortness of breath.  No nausea or vomiting.  No neurological changes.  No new rashes.  No dysuria, no melena.  Positive left shoulder discomfort.  No other musculoskeletal complaints.  There were 10+ systems reviewed.  See ER sheet.  PAST MEDICAL HISTORY: 1. Peptic ulcer disease. 2. Hiatal hernia. 3. Anxiety. 4. Fibromyalgia. 5. Questionable chronic lung disease not otherwise specified.  PAST SURGICAL HISTORY: 1. Tubal ligation. 2. Cholecystectomy.  SOCIAL HISTORY:  She is married, two children.  Positive two-pack-per-day smoker.  No  alcohol use.  She is unemployed.  ALLERGIES:  SULFA and CODEINE.  FAMILY HISTORY:  Positive for coronary artery disease with a brother passing away at age 48 of a massive MI and father dying at age 2 with an MI.  MEDICATIONS: 1. Prevacid 30 mg p.o. q.d. 2. Albuterol MDI.  PHYSICAL EXAMINATION:  VITAL SIGNS:  Temperature 98.2, pulse 116, blood pressure 134/84, respiratory rate 22.  GENERAL:  Awake and oriented female in no apparent distress.  HEENT:  Pupils are equal, round, and reactive to light.  Extraocular movements intact.  NECK:  Supple.  No JVD, no bruit, no thyromegaly, no lymphadenopathy. Tympanic membranes within normal limits.  LUNGS:  Clear to auscultation.  HEART:  Regular rate and rhythm.  Positive anterior chest wall tenderness to palpation.  CHEST:  Left shoulder:  Diminished range of motion secondary to pain. Positive impingement sign.  ABDOMEN:  Soft, nontender, nondistended.  Positive bowel sounds.  No hepatosplenomegaly.  EXTREMITIES:  No clubbing, cyanosis, or edema.  NEUROLOGIC:  Grossly nonfocal.  LABORATORY DATA:  EKG with normal sinus rhythm.  Chest x-ray showed chronic interstitial lung disease, NAD otherwise.  Sodium 140, potassium 3.4, chloride 111, bicarbonate 25, BUN less than 5, creatinine 0.6, glucose 103, calcium 9.2, albumin 3.8, AST 15, ALT 10, alkaline phosphatase 73, total bilirubin 1.3.  CK was 34, MB less than 0.3, troponin less than 0.01.  WBC 6.4, hemoglobin 14.3, platelets 350.  IMPRESSION AND PLAN:  Ms. Langton presents with atypical chest pain, likely gastroesophageal reflux-related.  Her left shoulder pain appears to be possible bursitis with a positive impingement sign.  She also has a history of panic attacks, and there may be an element of this in her symptomatology as well.  She does have some cardiac risk factors with a strong family history of coronary artery disease and her smoking history.  EKG was negative,  and enzymes were negative.  Discussed the case with Dr. Jeanella Cara, cardiology.  We will discharge the patient.  She is going to be called to be set up for an outpatient Cardiolite in our office.  Will also start the patient on Zoloft 50 mg p.o. q.d. for panic attacks and anxiety.  For her bursitis, the patient does have a history of peptic ulcer disease.  We will use short-term of NSAID of choice, ibuprofen 400 mg p.o. t.i.d. for safety.  Continue with the Prevacid.  She is to follow up with Dr. Barbee Shropshire in 7-10 days or sooner if any symptoms recur.  DIAGNOSES: 1. Atypical chest pain. 2. Costochondritis. 3. Gastroesophageal reflux disease. 4. Bursitis. 5. Anxiety. 6. Tobacco abuse.  DISPOSITION:  Home.  FOLLOW-UP:  Follow up with [facility] for outpatient Cardiolite. Follow up with Dr. Barbee Shropshire in 7-10 days.  MEDICATIONS:  Start Zoloft 50 mg p.o. q.d., prescription was written. Dictated by:   Kern Reap, M.D. Attending Physician:  Donnetta Hutching DD:  07/23/01 TD:  07/23/01 Job: [id number]

## 2011-05-23 ENCOUNTER — Ambulatory Visit (INDEPENDENT_AMBULATORY_CARE_PROVIDER_SITE_OTHER): Payer: Medicaid Other | Admitting: Family Medicine

## 2011-05-23 DIAGNOSIS — L600 Ingrowing nail: Secondary | ICD-10-CM

## 2011-05-23 NOTE — Progress Notes (Signed)
  Subjective:    Patient ID: Sheri Rivera, female    DOB: 06-16-1961, 50 y.o.   MRN: [id number]  HPI  1) Toenail removal: Patient seen previously for thickened toenail at left great toe - consistent with onychomycosis, toenail removed in 2008 (without phenolization). Reports that her toenail re-grew and is ingrown. She denies swelling, redness, purulent discharge, fever, chills, numbness, tingling. She would like to have her toenail removed permanently Review of Systems     Objective:   Physical Exam General: well appearing, NAD Foot: left great toe thickened and ingrown laterally and medially without paronychia.     Area prepped in usual sterile fashion with Betadine wash.  Left great toe identified for complete nail avulsion and consent obtained from patient.  Digital nerve block applied medially and laterally to great toe with 2% buffered lidocaine without epi.  Anesthesia verified manually.  Nail elevated with elevator and then cut midline with nail splitter.  Nail fragments removed with hemostats.  Area inspected for hemostatis.  Blood loss minimal.  Phenol applied to matrix. Area cleaned and neosporin applied and area bandaged.  Patient stable.     Assessment & Plan:

## 2011-05-23 NOTE — Patient Instructions (Addendum)
Follow up as before with your regular doctor.   - Dr. Wallene Huh   Toenail Removal Toenails may need to be removed because of injury, infections or to correct abnormal growth. A special non-stick bandage will likely be put tightly on your toe to prevent bleeding. Often times a new nail will grow back. Sometimes the new nail may be deformed. Most of the time when a nail is lost, it will gradually heal, but may be sensitive for a long time. HOME CARE INSTRUCTIONS  Keep your foot elevated to relieve pain and swelling. This will require lying in bed or on a couch with the leg on pillows or sitting in a recliner with the leg up. Walking or letting your leg dangle may increase swelling, slow healing and cause throbbing pain.   Keep your bandage dry and clean.   Change your bandage in 24 hours.   After your bandage is changed, soak your foot in warm, soapy water for 10 to 20 minutes. Do this 3 times per day. This helps reduce pain and swelling. After soaking your foot, apply a clean, dry bandage. Change your bandage if it is wet or dirty.   Only take over-the-counter or prescription medicines for pain, discomfort, or fever as directed by your caregiver.   See your caregiver as needed for problems.  SEEK IMMEDIATE MEDICAL CARE IF:  You have increased pain, swelling, redness & warmth (inflammation), drainage or bleeding.   An oral temperature above 100.4 develops, not controlled by medication.   You have swelling that spreads from your toe into your foot.  YOU MIGHT NEED A TETANUS SHOT NOW IF:  You have no idea when you had the last one.   You have never had a tetanus shot before.   The injured area had dirt in it.  If you need a tetanus shot, and you decide not to get one, there is a rare chance of getting tetanus. Sickness from tetanus can be serious. If you did get a tetanus shot, your arm may swell, get red and warm to the touch at the shot site. This is common and not a problem. Document  Released: 08/11/2003 Document Re-Released: 02/08/2009 [facility] Patient Information 2011 [place], [place].

## 2011-05-23 NOTE — Assessment & Plan Note (Addendum)
Complete nail avulsion per procedure note. Aftercare instructions given. Follow up with PCP as needed.

## 2011-05-27 ENCOUNTER — Emergency Department (HOSPITAL_COMMUNITY): Payer: Medicaid Other

## 2011-05-27 ENCOUNTER — Emergency Department (HOSPITAL_COMMUNITY)
Admission: EM | Admit: 2011-05-27 | Discharge: 2011-05-27 | Disposition: A | Discharge status: Home / Self Care | Payer: Medicaid Other | Attending: Emergency Medicine | Admitting: Emergency Medicine

## 2011-05-27 DIAGNOSIS — R059 Cough, unspecified: Secondary | ICD-10-CM | POA: Insufficient documentation

## 2011-05-27 DIAGNOSIS — J45909 Unspecified asthma, uncomplicated: Secondary | ICD-10-CM | POA: Insufficient documentation

## 2011-05-27 DIAGNOSIS — F17200 Nicotine dependence, unspecified, uncomplicated: Secondary | ICD-10-CM | POA: Insufficient documentation

## 2011-05-27 DIAGNOSIS — R05 Cough: Secondary | ICD-10-CM | POA: Insufficient documentation

## 2011-05-27 DIAGNOSIS — R0789 Other chest pain: Secondary | ICD-10-CM | POA: Insufficient documentation

## 2011-05-27 DIAGNOSIS — J069 Acute upper respiratory infection, unspecified: Secondary | ICD-10-CM | POA: Insufficient documentation

## 2011-05-27 DIAGNOSIS — R0602 Shortness of breath: Secondary | ICD-10-CM | POA: Insufficient documentation

## 2011-06-21 ENCOUNTER — Ambulatory Visit: Payer: Medicaid Other | Admitting: Family Medicine

## 2011-06-22 ENCOUNTER — Telehealth: Payer: Self-pay | Admitting: *Deleted

## 2011-06-22 ENCOUNTER — Ambulatory Visit (INDEPENDENT_AMBULATORY_CARE_PROVIDER_SITE_OTHER): Payer: Medicaid Other | Admitting: Family Medicine

## 2011-06-22 ENCOUNTER — Encounter: Payer: Self-pay | Admitting: Family Medicine

## 2011-06-22 VITALS — BP 107/73 | HR 73 | Temp 98.1°F | Ht 63.0 in | Wt 163.0 lb

## 2011-06-22 DIAGNOSIS — R221 Localized swelling, mass and lump, neck: Secondary | ICD-10-CM

## 2011-06-22 DIAGNOSIS — R220 Localized swelling, mass and lump, head: Secondary | ICD-10-CM

## 2011-06-22 DIAGNOSIS — L600 Ingrowing nail: Secondary | ICD-10-CM

## 2011-06-22 MED ORDER — VALACYCLOVIR HCL 1 G PO TABS: 500.0000 mg | ORAL_TABLET | Freq: Every day | ORAL | Status: DC

## 2011-06-22 MED ORDER — FLUTICASONE-SALMETEROL 250-50 MCG/DOSE IN AEPB: 1.0000 | INHALATION_SPRAY | Freq: Two times a day (BID) | RESPIRATORY_TRACT | Status: DC

## 2011-06-22 MED ORDER — DOXYCYCLINE HYCLATE 50 MG PO CAPS: 50.0000 mg | ORAL_CAPSULE | Freq: Two times a day (BID) | ORAL | Status: AC

## 2011-06-22 MED ORDER — ALBUTEROL SULFATE HFA 108 (90 BASE) MCG/ACT IN AERS: 2.0000 | INHALATION_SPRAY | RESPIRATORY_TRACT | Status: DC | PRN

## 2011-06-22 NOTE — Telephone Encounter (Signed)
PA required for Advair. Form placed in MD box. 

## 2011-06-22 NOTE — Patient Instructions (Signed)
Thank you for coming in today. Take it easy and try to keep that foot elevated if possible.  Take the antibiotics twice a day for 1 week.  It may drain or it may not drain. I am not sure there was much of an infection pocket to drain.  We will call you about the ENT referral.  It makes sense for [place] to see each-other every 6 months or so.

## 2011-06-25 ENCOUNTER — Encounter: Payer: Self-pay | Admitting: Family Medicine

## 2011-06-25 NOTE — Assessment & Plan Note (Signed)
Not sure what is causing the swelling. However it has been there long enough for a workup to proceed. The patient is very insistent on an ENT referral. I feel it is reasonable to refer first before obtaining further testing like X-ray or CT as the ENT may be able to avoid these expensive and invasive tests.  Will follow.

## 2011-06-25 NOTE — Assessment & Plan Note (Addendum)
S/p Nail removal. Now with what may be some nail regrowth vs infection.  Plan: Pt desired drainage of tense and fluctuance area dorsally. However no pus was drainage.  There may be an infection or this may be nail regrowth. Will use  1 week of doxycycline and follow up.  If nail regrowth we may need referral to podiatry.

## 2011-06-25 NOTE — Telephone Encounter (Signed)
Done pharmacy will call the patient

## 2011-06-25 NOTE — Progress Notes (Signed)
Sheri Rivera presents to clinic with left great toe pain.  She had that toenail removed a few weeks ago. It begun to hurt and become painful a few days ago. Additionally it is red and warm and tender to the touch. She notes that wearing shoes is painful.  She feels like it may be infected and may need to be drained.   Additionally Sheri Rivera now has medicaid. She would like a referral to an ENT for neck swelling for around 6 months. Her previous PCP agreed for ENT referral non-urgently however she was unable to be seen as she did not have insurance. Sheri Rivera notes mild non-tender neck swelling under her mandible. No difficulty swallowing and breathing. It is not getting worse.   PMH reviewed.  ROS as above otherwise neg  Exam:  BP 107/73  Pulse 73  Temp(Src) 98.1 F (36.7 C) (Oral)  Ht 5\' 3"  (1.6 m)  Wt 163 lb (73.936 kg)  BMI 28.87 kg/m2 Gen: Well NAD HEENT: Neck: Palpable sub-mandibular lymph node 1cm in diameter on left side. Non-tender and freely mobile.  Otherwise neck exam is benign.  Lungs: CTABL Nl WOB Heart: RRR no MRG Abd: NABS, NT, ND Exts: Non edematous BL  LE, warm and well perfused.  Toe: Left great toe: Partial nail regrowth and eschar noted. Red, warm and tender to the touch area just distal to the nail bed. Some fluctuance noted. Erythremia noted extending to the DIP joint.    Procedure: Consent obtained and time out performed. Pt declined lidocaine.  Area cleaned with alcohol. Small incision 2mm in area of fluctuance on dorsal toe. Small amount of blood drained. No pus. Area wrapped in gauze.

## 2011-06-27 ENCOUNTER — Telehealth: Payer: Self-pay | Admitting: Family Medicine

## 2011-06-27 NOTE — Telephone Encounter (Signed)
Please tell patient I faxed referral to [facility], they will contact her with the appointment once it has been made.Kyndal Heringer, Rodena Medin

## 2011-06-27 NOTE — Telephone Encounter (Signed)
Pt's throat is still really sore and wants to know how soon she can get in to ENT

## 2011-07-05 ENCOUNTER — Ambulatory Visit: Payer: Medicaid Other | Admitting: Family Medicine

## 2011-07-14 ENCOUNTER — Emergency Department (HOSPITAL_COMMUNITY)
Admission: EM | Admit: 2011-07-14 | Discharge: 2011-07-15 | Disposition: A | Discharge status: Home / Self Care | Payer: Medicaid Other | Attending: Emergency Medicine | Admitting: Emergency Medicine

## 2011-07-14 ENCOUNTER — Emergency Department (HOSPITAL_COMMUNITY): Payer: Medicaid Other

## 2011-07-14 DIAGNOSIS — R0682 Tachypnea, not elsewhere classified: Secondary | ICD-10-CM | POA: Insufficient documentation

## 2011-07-14 DIAGNOSIS — M940 Chondrocostal junction syndrome [Tietze]: Secondary | ICD-10-CM | POA: Insufficient documentation

## 2011-07-14 DIAGNOSIS — R079 Chest pain, unspecified: Secondary | ICD-10-CM | POA: Insufficient documentation

## 2011-07-14 DIAGNOSIS — R071 Chest pain on breathing: Secondary | ICD-10-CM | POA: Insufficient documentation

## 2011-07-14 DIAGNOSIS — R0989 Other specified symptoms and signs involving the circulatory and respiratory systems: Secondary | ICD-10-CM | POA: Insufficient documentation

## 2011-07-14 DIAGNOSIS — R0609 Other forms of dyspnea: Secondary | ICD-10-CM | POA: Insufficient documentation

## 2011-07-14 LAB — DIFFERENTIAL
Basophils Absolute: 0 10*3/uL (ref 0.0–0.1)
Basophils Relative: 0 % (ref 0–1)
Eosinophils Absolute: 0.1 10*3/uL (ref 0.0–0.7)
Eosinophils Relative: 1 % (ref 0–5)
Lymphocytes Relative: 34 % (ref 12–46)
Lymphs Abs: 2.9 10*3/uL (ref 0.7–4.0)
Monocytes Absolute: 0.7 10*3/uL (ref 0.1–1.0)
Monocytes Relative: 8 % (ref 3–12)
Neutro Abs: 4.9 10*3/uL (ref 1.7–7.7)
Neutrophils Relative %: 57 % (ref 43–77)

## 2011-07-14 LAB — URINALYSIS, ROUTINE W REFLEX MICROSCOPIC
Bilirubin Urine: NEGATIVE
Glucose, UA: NEGATIVE mg/dL
Ketones, ur: NEGATIVE mg/dL
Leukocytes, UA: NEGATIVE
Nitrite: NEGATIVE
Protein, ur: NEGATIVE mg/dL
Specific Gravity, Urine: 1.021 (ref 1.005–1.030)
Urobilinogen, UA: 0.2 mg/dL (ref 0.0–1.0)
pH: 6 (ref 5.0–8.0)

## 2011-07-14 LAB — CBC
HCT: 44 % (ref 36.0–46.0)
Hemoglobin: 15.3 g/dL — ABNORMAL HIGH (ref 12.0–15.0)
MCH: 31.8 pg (ref 26.0–34.0)
MCHC: 34.8 g/dL (ref 30.0–36.0)
MCV: 91.5 fL (ref 78.0–100.0)
Platelets: 257 10*3/uL (ref 150–400)
RBC: 4.81 MIL/uL (ref 3.87–5.11)
RDW: 13.7 % (ref 11.5–15.5)
WBC: 8.6 10*3/uL (ref 4.0–10.5)

## 2011-07-14 LAB — URINE MICROSCOPIC-ADD ON

## 2011-07-14 LAB — COMPREHENSIVE METABOLIC PANEL
ALT: 11 U/L (ref 0–35)
AST: 15 U/L (ref 0–37)
Albumin: 3.6 g/dL (ref 3.5–5.2)
Alkaline Phosphatase: 97 U/L (ref 39–117)
BUN: 10 mg/dL (ref 6–23)
CO2: 21 mEq/L (ref 19–32)
Calcium: 10 mg/dL (ref 8.4–10.5)
Chloride: 105 mEq/L (ref 96–112)
Creatinine, Ser: 0.55 mg/dL (ref 0.50–1.10)
GFR calc Af Amer: >60 mL/min (ref >60)
GFR calc non Af Amer: >60 mL/min (ref >60)
Glucose, Bld: 119 mg/dL — ABNORMAL HIGH (ref 70–99)
Potassium: 3.7 mEq/L (ref 3.5–5.1)
Sodium: 137 mEq/L (ref 135–145)
Total Bilirubin: 0.4 mg/dL (ref 0.3–1.2)
Total Protein: 7.1 g/dL (ref 6.0–8.3)

## 2011-07-14 LAB — D-DIMER, QUANTITATIVE (NOT AT ARMC): D-Dimer, Quant: 0.39 ug/mL-FEU (ref 0.00–0.48)

## 2011-07-14 LAB — LIPASE, BLOOD: Lipase: 41 U/L (ref 11–59)

## 2011-07-14 LAB — TROPONIN I
Troponin I: <0.3 ng/mL (ref <0.30)
Troponin I: <0.3 ng/mL (ref <0.30)

## 2011-07-16 ENCOUNTER — Ambulatory Visit: Payer: Medicaid Other | Admitting: Family Medicine

## 2011-07-16 LAB — URINE CULTURE
Colony Count: NO GROWTH
Culture  Setup Time: 201208191126
Culture: NO GROWTH

## 2011-07-17 ENCOUNTER — Telehealth: Payer: Self-pay | Admitting: Family Medicine

## 2011-07-17 NOTE — Telephone Encounter (Signed)
Called [facility]. Pt had appt on 07-11-11 and also received reminder card from them. She did not keep appt. I rescheduled her appt for 07-24-11 at 2pm with Dr.Shoemaker. If she misses this appt again, they will not see her for a new appt. Called pt back with above information. Pt was yelling on the phone and I told her to stop yelling. I gave her the appt info and advised her to keep the appt. Pt agreed. Lorenda Hatchet, Renato Battles

## 2011-07-17 NOTE — Telephone Encounter (Signed)
Is concerned because she has not been called by [facility] and is concerned about the white bumps on her throat.  She would like to speak with someone about this.

## 2011-07-18 ENCOUNTER — Ambulatory Visit (INDEPENDENT_AMBULATORY_CARE_PROVIDER_SITE_OTHER): Payer: Medicaid Other | Admitting: Family Medicine

## 2011-07-18 ENCOUNTER — Encounter: Payer: Self-pay | Admitting: Family Medicine

## 2011-07-18 DIAGNOSIS — J029 Acute pharyngitis, unspecified: Secondary | ICD-10-CM

## 2011-07-18 DIAGNOSIS — R221 Localized swelling, mass and lump, neck: Secondary | ICD-10-CM

## 2011-07-18 DIAGNOSIS — R220 Localized swelling, mass and lump, head: Secondary | ICD-10-CM

## 2011-07-18 LAB — POCT RAPID STREP A (OFFICE): Rapid Strep A Screen: NEGATIVE

## 2011-07-18 NOTE — Patient Instructions (Signed)
I'm sorry you are having all of this neck pain!  You could try some chloraseptic spray to see if that helps numb your throat a little to make swalowing less painful.  It is over the counter.  You could also try honey in warm tea to see if that helps soothe your throat.  You could also increase your Advil dose to 600mg  and take it every 8 hours, scheduled, to try to help with the pain for the next 5 days.   Luckily, it's not strep throat, so I don't think we need to start any antibiotics.  Make sure you keep that appointment with the ENT doctors!  Come back if your throat worsens to the point where you are having difficulty breathing or cannot eat or drink anything because of the pain.

## 2011-07-18 NOTE — Assessment & Plan Note (Signed)
Per pt, slightly worsened in the setting of acute illness.  Pt has appt with END 8/28 and was instructed to keep this appt.  No s/s of difficulty swallowing or breathing.  No red flags.

## 2011-07-18 NOTE — Assessment & Plan Note (Signed)
Strep negative. C/w viral illness.  Suggested chlorhexidine for swallowing discomfort, advil 600 q8 x4-5 days, and warm tea with honey. F/u PRN

## 2011-07-18 NOTE — Progress Notes (Signed)
S: Pt comes in today for sore throat.  Per pt, she has had issues with sore throat for extended period of time (months to years) but it has acutely worsened since Sunday (3 days ago).  On Sunday, she had lymph node swelling and noticed white bumps on her tonsils. Has not had any fevers but has been achy all over.  + cough. No rhinorrhea or congestion. No N/V/D.  Has been gargling with salt water and taking advil 400 PRN.  Both of these help a little.  Has ENT referral for 8/28 for chronic neck swelling but feels like her LN have gotten more swollen.  Is having some trouble swallowing colds and solids b/c of pain/discomfort but is easily able to stay well hydrated and well nourished.   ROS: Per HPI  History  Smoking status  . Current Everyday Smoker -- 0.5 packs/day  . Types: Cigarettes  Smokeless tobacco  . Not on file    O:  Filed Vitals:   07/18/11 0849  BP: 121/85  Pulse: 71  Temp: 97.7 F (36.5 C)    Gen: NAD HEENT: mild pharyngeal erythema, no exudate, enlarged anterior cervical LNs; TMs nml, nares/nose nml; EOMI, PERRLA, sclera white   A/P: 50 y.o. female p/w sore throat -See problem list -f/u PRN

## 2011-08-08 ENCOUNTER — Telehealth: Payer: Self-pay | Admitting: Family Medicine

## 2011-08-08 DIAGNOSIS — R0902 Hypoxemia: Secondary | ICD-10-CM

## 2011-08-08 NOTE — Telephone Encounter (Signed)
Will route to her PCP. 

## 2011-08-08 NOTE — Telephone Encounter (Signed)
Sheri Rivera tonsillectomy surgery was cancelled this morning to due to low oxygen level.  Recommended that she see a pulmonologist first before resched.  Please make referral and contact her with information.

## 2011-08-09 NOTE — Telephone Encounter (Signed)
Referral to [facility] pulmonology is in. We will follow.

## 2011-08-10 ENCOUNTER — Telehealth: Payer: Self-pay | Admitting: *Deleted

## 2011-08-10 NOTE — Telephone Encounter (Signed)
See info .Sheri Rivera

## 2011-08-20 LAB — DIFFERENTIAL
Basophils Absolute: 0.1
Basophils Absolute: 0
Basophils Relative: 2 — ABNORMAL HIGH
Basophils Relative: 0
Eosinophils Absolute: 0.1
Eosinophils Absolute: 0.1
Eosinophils Relative: 1
Eosinophils Relative: 1
Lymphocytes Relative: 39
Lymphocytes Relative: 39
Lymphs Abs: 3.5
Lymphs Abs: 3.5
Monocytes Absolute: 0.6
Monocytes Absolute: 0.6
Monocytes Relative: 7
Monocytes Relative: 7
Neutro Abs: 4.5
Neutro Abs: 4.7
Neutrophils Relative %: 51
Neutrophils Relative %: 53

## 2011-08-20 LAB — COMPREHENSIVE METABOLIC PANEL
ALT: 12
AST: 18
Albumin: 3.6
Alkaline Phosphatase: 70
BUN: 2 — ABNORMAL LOW
CO2: 23
Calcium: 9.5
Chloride: 107
Creatinine, Ser: 0.53
GFR calc Af Amer: >60
GFR calc non Af Amer: >60
Glucose, Bld: 90
Potassium: 3.6
Sodium: 138
Total Bilirubin: 0.8
Total Protein: 7.1

## 2011-08-20 LAB — URINALYSIS, ROUTINE W REFLEX MICROSCOPIC
Bilirubin Urine: NEGATIVE
Glucose, UA: NEGATIVE
Ketones, ur: NEGATIVE
Leukocytes, UA: NEGATIVE
Nitrite: NEGATIVE
Protein, ur: NEGATIVE
Specific Gravity, Urine: 1.008
Urobilinogen, UA: 0.2
pH: 7

## 2011-08-20 LAB — BASIC METABOLIC PANEL
BUN: 6
CO2: 25
Calcium: 9.6
Chloride: 109
Creatinine, Ser: 0.49
GFR calc Af Amer: >60
GFR calc non Af Amer: >60
Glucose, Bld: 87
Potassium: 3.7
Sodium: 140

## 2011-08-20 LAB — URINE MICROSCOPIC-ADD ON

## 2011-08-20 LAB — CBC
HCT: 37.4
HCT: 39.2
Hemoglobin: 12.5
Hemoglobin: 13.1
MCHC: 33.4
MCHC: 33.5
MCV: 82.4
MCV: 82.3
Platelets: 301
Platelets: 329
RBC: 4.53
RBC: 4.76
RDW: 16.5 — ABNORMAL HIGH
RDW: 16.2 — ABNORMAL HIGH
WBC: 8.8
WBC: 8.9

## 2011-08-20 LAB — LIPASE, BLOOD: Lipase: 25

## 2011-08-23 LAB — POCT I-STAT, CHEM 8
BUN: 5 — ABNORMAL LOW
Calcium, Ion: 1.14
Chloride: 106
Creatinine, Ser: 0.7
Glucose, Bld: 98
HCT: 38
Hemoglobin: 12.9
Potassium: 4
Sodium: 137
TCO2: 21

## 2011-08-23 LAB — DIFFERENTIAL
Basophils Absolute: 0.1
Basophils Relative: 1
Eosinophils Absolute: 0.1
Eosinophils Relative: 1
Lymphocytes Relative: 31
Lymphs Abs: 3.3
Monocytes Absolute: 0.5
Monocytes Relative: 5
Neutro Abs: 6.7
Neutrophils Relative %: 63

## 2011-08-23 LAB — CBC
HCT: 37.1
Hemoglobin: 12.3
MCHC: 33.3
MCV: 84.8
Platelets: 260
RBC: 4.37
RDW: 16.9 — ABNORMAL HIGH
WBC: 10.7 — ABNORMAL HIGH

## 2011-08-24 LAB — POCT I-STAT, CHEM 8
BUN: <3 — ABNORMAL LOW
Calcium, Ion: 1.16
Chloride: 106
Creatinine, Ser: 0.6
Glucose, Bld: 92
HCT: 35 — ABNORMAL LOW
Hemoglobin: 11.9 — ABNORMAL LOW
Potassium: 3.7
Sodium: 139
TCO2: 23

## 2011-08-24 LAB — URINALYSIS, ROUTINE W REFLEX MICROSCOPIC
Bilirubin Urine: NEGATIVE
Glucose, UA: NEGATIVE
Ketones, ur: NEGATIVE
Leukocytes, UA: NEGATIVE
Nitrite: NEGATIVE
Protein, ur: NEGATIVE
Specific Gravity, Urine: 1.017
Urobilinogen, UA: 1
pH: 8

## 2011-08-24 LAB — URINE MICROSCOPIC-ADD ON

## 2011-08-24 LAB — DIFFERENTIAL
Basophils Absolute: 0.1
Basophils Relative: 1
Eosinophils Absolute: 0.1
Eosinophils Relative: 1
Lymphocytes Relative: 21
Lymphs Abs: 2.6
Monocytes Absolute: 0.5
Monocytes Relative: 4
Neutro Abs: 9.2 — ABNORMAL HIGH
Neutrophils Relative %: 74

## 2011-08-24 LAB — COMPREHENSIVE METABOLIC PANEL
ALT: 14
AST: 16
Albumin: 3.5
Alkaline Phosphatase: 74
BUN: 5 — ABNORMAL LOW
CO2: 26
Calcium: 9.4
Chloride: 104
Creatinine, Ser: 0.52
GFR calc Af Amer: >60
GFR calc non Af Amer: >60
Glucose, Bld: 93
Potassium: 4.1
Sodium: 138
Total Bilirubin: 0.7
Total Protein: 6.8

## 2011-08-24 LAB — CBC
HCT: 40.2
Hemoglobin: 13.4
MCHC: 33.4
MCV: 84.7
Platelets: 277
RBC: 4.75
RDW: 16.1 — ABNORMAL HIGH
WBC: 12.4 — ABNORMAL HIGH

## 2011-08-24 LAB — ABO/RH: ABO/RH(D): O POS

## 2011-08-24 LAB — TYPE AND SCREEN
ABO/RH(D): O POS
Antibody Screen: NEGATIVE

## 2011-08-24 LAB — PREGNANCY, URINE: Preg Test, Ur: NEGATIVE

## 2011-08-24 LAB — PROTIME-INR
INR: 0.9
Prothrombin Time: 12.6

## 2011-09-28 ENCOUNTER — Encounter: Payer: Self-pay | Admitting: Family Medicine

## 2011-09-28 ENCOUNTER — Ambulatory Visit (INDEPENDENT_AMBULATORY_CARE_PROVIDER_SITE_OTHER): Payer: Medicaid Other | Admitting: Family Medicine

## 2011-09-28 DIAGNOSIS — J069 Acute upper respiratory infection, unspecified: Secondary | ICD-10-CM | POA: Insufficient documentation

## 2011-09-28 DIAGNOSIS — IMO0001 Reserved for inherently not codable concepts without codable children: Secondary | ICD-10-CM

## 2011-09-28 DIAGNOSIS — F17200 Nicotine dependence, unspecified, uncomplicated: Secondary | ICD-10-CM

## 2011-09-28 DIAGNOSIS — R0602 Shortness of breath: Secondary | ICD-10-CM

## 2011-09-28 NOTE — Assessment & Plan Note (Signed)
Counseled patient that shortness of breath and prolonged URI May be related to tobacco use. Encourage patient to quit. She is not yet ready to consider quitting.

## 2011-09-28 NOTE — Assessment & Plan Note (Signed)
Has been going on x1 week. No fever. No need for antibiotics for sinusitis is unlikely at this time. Continue supportive treatment such as nasal saline and Afrin.

## 2011-09-28 NOTE — Patient Instructions (Addendum)
It was nice to meet you. At this point, there is no need for antibiotics. Please go to [facility] to have the chest x-ray done to make sure that there is no pneumonia, however, I have a very low suspicion that this is the case.  I think that your sinus congestion is still due to a virus. You can try things such as nasal saline sprays, Nettie pot, or over-the-counter Afrin to help with her nasal congestion. You can continue using things like Mucinex if you would like. You can continue using your albuterol inhaler if you feel like you need it, however your oxygen number was great today so I do not think that there is any blockage in your lungs that would require you to have a nebulizer. For your fibromyalgia, please see your PCP, Dr. Denyse Amass, so that he can discuss with you the multiple different medical options such as starting an SNRI.  I would suggest trying to see him as soon as your schedules match up so that you can get started on a regimen.  Please come back if you started having fevers, chills, or if your sinus congestion has not improved after 1-2 weeks.   Upper Respiratory Infection, Adult An upper respiratory infection (URI) is also sometimes known as the common cold. The upper respiratory tract includes the nose, sinuses, throat, trachea, and bronchi. Bronchi are the airways leading to the lungs. Most people improve within 1 week, but symptoms can last up to 2 weeks. A residual cough may last even longer.  CAUSES Many different viruses can infect the tissues lining the upper respiratory tract. The tissues become irritated and inflamed and often become very moist. Mucus production is also common. A cold is contagious. You can easily spread the virus to others by oral contact. This includes kissing, sharing a glass, coughing, or sneezing. Touching your mouth or nose and then touching a surface, which is then touched by another person, can also spread the virus. SYMPTOMS  Symptoms typically  develop 1 to 3 days after you come in contact with a cold virus. Symptoms vary from person to person. They may include:  Runny nose.   Sneezing.   Nasal congestion.   Sinus irritation.   Sore throat.   Loss of voice (laryngitis).   Cough.   Fatigue.   Muscle aches.   Loss of appetite.   Headache.   Low-grade fever.  DIAGNOSIS  You might diagnose your own cold based on familiar symptoms, since most people get a cold 2 to 3 times a year. Your caregiver can confirm this based on your exam. Most importantly, your caregiver can check that your symptoms are not due to another disease such as strep throat, sinusitis, pneumonia, asthma, or epiglottitis. Blood tests, throat tests, and X-rays are not necessary to diagnose a common cold, but they may sometimes be helpful in excluding other more serious diseases. Your caregiver will decide if any further tests are required. RISKS AND COMPLICATIONS  You may be at risk for a more severe case of the common cold if you smoke cigarettes, have chronic heart disease (such as heart failure) or lung disease (such as asthma), or if you have a weakened immune system. The very young and very old are also at risk for more serious infections. Bacterial sinusitis, middle ear infections, and bacterial pneumonia can complicate the common cold. The common cold can worsen asthma and chronic obstructive pulmonary disease (COPD). Sometimes, these complications can require emergency medical care and may  be life-threatening. PREVENTION  The best way to protect against getting a cold is to practice good hygiene. Avoid oral or hand contact with people with cold symptoms. Wash your hands often if contact occurs. There is no clear evidence that vitamin C, vitamin E, echinacea, or exercise reduces the chance of developing a cold. However, it is always recommended to get plenty of rest and practice good nutrition. TREATMENT  Treatment is directed at relieving symptoms. There  is no cure. Antibiotics are not effective, because the infection is caused by a virus, not by bacteria. Treatment may include:  Increased fluid intake. Sports drinks offer valuable electrolytes, sugars, and fluids.   Breathing heated mist or steam (vaporizer or shower).   Eating chicken soup or other clear broths, and maintaining good nutrition.   Getting plenty of rest.   Using gargles or lozenges for comfort.   Controlling fevers with ibuprofen or acetaminophen as directed by your caregiver.   Increasing usage of your inhaler if you have asthma.  Zinc gel and zinc lozenges, taken in the first 24 hours of the common cold, can shorten the duration and lessen the severity of symptoms. Pain medicines may help with fever, muscle aches, and throat pain. A variety of non-prescription medicines are available to treat congestion and runny nose. Your caregiver can make recommendations and may suggest nasal or lung inhalers for other symptoms.  HOME CARE INSTRUCTIONS   Only take over-the-counter or prescription medicines for pain, discomfort, or fever as directed by your caregiver.   Use a warm mist humidifier or inhale steam from a shower to increase air moisture. This may keep secretions moist and make it easier to breathe.   Drink enough water and fluids to keep your urine clear or pale yellow.   Rest as needed.   Return to work when your temperature has returned to normal or as your caregiver advises. You may need to stay home longer to avoid infecting others. You can also use a face mask and careful hand washing to prevent spread of the virus.  SEEK MEDICAL CARE IF:   After the first few days, you feel you are getting worse rather than better.   You need your caregiver's advice about medicines to control symptoms.   You develop chills, worsening shortness of breath, or brown or red sputum. These may be signs of pneumonia.   You develop yellow or brown nasal discharge or pain in the  face, especially when you bend forward. These may be signs of sinusitis.   You develop a fever, swollen neck glands, pain with swallowing, or white areas in the back of your throat. These may be signs of strep throat.  SEEK IMMEDIATE MEDICAL CARE IF:   You have a fever.   You develop severe or persistent headache, ear pain, sinus pain, or chest pain.   You develop wheezing, a prolonged cough, cough up blood, or have a change in your usual mucus (if you have chronic lung disease).   You develop sore muscles or a stiff neck.  Document Released: 05/08/2001 Document Revised: 07/25/2011 Document Reviewed: 03/16/2011 [facility] Patient Information 2012 [place], [place].

## 2011-09-28 NOTE — Assessment & Plan Note (Signed)
Unlikely to be pneumonia based on physical exam and lack of fever, but will have patient go for a chest x-ray to rule out atypical pneumonia. No antibiotics will be started at this time. O2 sat 98% so will not switch from MDI to albuterol neb. Patient should be able to tolerate MDI fine.

## 2011-09-28 NOTE — Assessment & Plan Note (Signed)
Will have patient followup with PCP for this. Could consider starting SNRI. Patient reports that pain is worse during the wintertime and has been having a "flare" x2 months.

## 2011-09-28 NOTE — Progress Notes (Signed)
S: Pt comes in today for URI, chronic pain.  URI Has been having sinus congestion and cough x1 week. States she's been using Claritin and Mucinex without much help. States that her mucus is a yellowish color and she is concerned that that means that it is infected. She's also been having some shortness of breath. She's been using her albuterol and Advair. States that her albuterol MDI is not helping. She's been using it every 2 hours. She is at the point where she feels like a nebulizer machine works better so she will use her grandsons. The medicine that she puts in that is a combination of albuterol and something else, she is unsure what the second agent is. She has not had any fevers or chills. She denies nausea, vomiting, and diarrhea although she does report some decreased appetite. She reports some pain in her right back and states that it feels like pneumonia. She feels like she is not getting enough air when she breathes. She also feels like her nose is swollen. She's had a nonproductive cough.  FIBROMYALGIA Patient reports that this is getting bad. She states she can't walk and is missing work. She states that this increase in pain began when the weather changed approximately 2 months ago. She states that she was fine over the summer. Patient states that the pain is everywhere. She feels like her lymph nodes are swollen all over. She reports pain to both light and deep touch. She has been trying ibuprofen but does not feel like this has given her any pain relief. She has not followed up with her PCP for this.   ROS: Per HPI  History  Smoking status  . Current Everyday Smoker -- 0.5 packs/day  . Types: Cigarettes  Smokeless tobacco  . Not on file    O:  Filed Vitals:   09/28/11 1024  BP: 118/81  Pulse: 66  Temp: 97.7 F (36.5 C)    Gen: NAD HEENT: EOMI, PERRLA, No sinus pressure, no pharyngeal erythema or exudate, TMs normal; mild cervical LAD (has chronic LAD) CV: RRR, no  murmur Pulm: CTA bilat, no wheezes or crackles, ?decreased breath sounds on R when compared to L Abd: soft, NT Ext: Warm, no chronic skin changes, no edema   A/P: 50 y.o. female p/w viral URI, fibromyalgia -See problem list -f/u with PCP

## 2011-10-30 ENCOUNTER — Emergency Department (HOSPITAL_COMMUNITY)
Admission: EM | Admit: 2011-10-30 | Discharge: 2011-10-30 | Discharge status: Against Medical Advice | Payer: Medicaid Other | Attending: Emergency Medicine | Admitting: Emergency Medicine

## 2011-10-30 DIAGNOSIS — R0989 Other specified symptoms and signs involving the circulatory and respiratory systems: Secondary | ICD-10-CM | POA: Insufficient documentation

## 2011-10-30 DIAGNOSIS — R0609 Other forms of dyspnea: Secondary | ICD-10-CM | POA: Insufficient documentation

## 2011-11-01 ENCOUNTER — Encounter (HOSPITAL_COMMUNITY): Payer: Self-pay | Admitting: *Deleted

## 2011-11-01 ENCOUNTER — Emergency Department (HOSPITAL_COMMUNITY)
Admission: EM | Admit: 2011-11-01 | Discharge: 2011-11-01 | Disposition: A | Discharge status: Home / Self Care | Payer: Medicaid Other | Attending: Emergency Medicine | Admitting: Emergency Medicine

## 2011-11-01 ENCOUNTER — Emergency Department (HOSPITAL_COMMUNITY): Payer: Medicaid Other

## 2011-11-01 DIAGNOSIS — J40 Bronchitis, not specified as acute or chronic: Secondary | ICD-10-CM | POA: Insufficient documentation

## 2011-11-01 DIAGNOSIS — R0789 Other chest pain: Secondary | ICD-10-CM

## 2011-11-01 DIAGNOSIS — R0602 Shortness of breath: Secondary | ICD-10-CM | POA: Insufficient documentation

## 2011-11-01 DIAGNOSIS — R509 Fever, unspecified: Secondary | ICD-10-CM | POA: Insufficient documentation

## 2011-11-01 DIAGNOSIS — J45909 Unspecified asthma, uncomplicated: Secondary | ICD-10-CM | POA: Insufficient documentation

## 2011-11-01 DIAGNOSIS — F17200 Nicotine dependence, unspecified, uncomplicated: Secondary | ICD-10-CM | POA: Insufficient documentation

## 2011-11-01 DIAGNOSIS — R071 Chest pain on breathing: Secondary | ICD-10-CM | POA: Insufficient documentation

## 2011-11-01 MED ORDER — DOXYCYCLINE HYCLATE 100 MG PO CAPS: 100.0000 mg | ORAL_CAPSULE | Freq: Two times a day (BID) | ORAL | Status: AC

## 2011-11-01 MED ORDER — ALBUTEROL SULFATE (5 MG/ML) 0.5% IN NEBU
5.0000 mg | INHALATION_SOLUTION | Freq: Once | RESPIRATORY_TRACT | Status: AC
  Administered 2011-11-01: 5 mg via RESPIRATORY_TRACT
  Filled 2011-11-01: qty 1

## 2011-11-01 MED ORDER — PREDNISONE 20 MG PO TABS
10.0000 mg | ORAL_TABLET | Freq: Once | ORAL | Status: AC
  Administered 2011-11-01: 10 mg via ORAL
  Filled 2011-11-01: qty 1

## 2011-11-01 MED ORDER — IPRATROPIUM BROMIDE 0.02 % IN SOLN
0.5000 mg | Freq: Once | RESPIRATORY_TRACT | Status: AC
  Administered 2011-11-01: 0.5 mg via RESPIRATORY_TRACT
  Filled 2011-11-01: qty 2.5

## 2011-11-01 MED ORDER — KETOROLAC TROMETHAMINE 60 MG/2ML IM SOLN
60.0000 mg | Freq: Once | INTRAMUSCULAR | Status: AC
  Administered 2011-11-01: 60 mg via INTRAMUSCULAR
  Filled 2011-11-01: qty 2

## 2011-11-01 MED ORDER — PREDNISONE 20 MG PO TABS: ORAL_TABLET | ORAL | Status: DC

## 2011-11-01 MED ORDER — CYCLOBENZAPRINE HCL 10 MG PO TABS
10.0000 mg | ORAL_TABLET | Freq: Once | ORAL | Status: AC
  Administered 2011-11-01: 10 mg via ORAL
  Filled 2011-11-01: qty 1

## 2011-11-01 NOTE — ED Provider Notes (Cosign Needed)
History     CSN: [id number] Arrival date & time: 11/01/2011  7:29 AM   First MD Initiated Contact with Patient 11/01/11 [phone]      Chief Complaint  Patient presents with  . Shortness of Breath    (Consider location/radiation/quality/duration/timing/severity/associated sxs/prior treatment) HPI Patient relates she was diagnosed with pneumonia 6 weeks ago. She states that she didn't have an x-ray that they listened to her lungs and said "it sounded wet". She states she was put on antibiotic that was penicillin with some adverse after it. She relates she thought she still better than about 4 AM this morning she started getting short of breath. She states she took her Advair and her albuterol without improvement. We discussed that Advair is a preventative and is not a rescue type inhaler. Patient states she has a dry cough without fever. She has pain in her right posterior chest but she's had for the past 6-8 weeks and she was diagnosed with pneumonia and states it's a small area about the size of the tangerine and .  has a pleuritic component to it. She denies nausea vomiting sore throat diarrhea. She states when she uses a nebulizer it helps better than inhaler. She states she recently had a test on her lungs and she does not have COPD, she states she was told she does have asthma. Patient also upset because she states she has fibromyalgia and "my doctors are doing anything about it".  [facility] (states she has an appointment to be seen at [facility])  Past Medical History  Diagnosis Date  . GERD (gastroesophageal reflux disease)   . Fibromyalgia   . Anxiety   . Asthma     Past Surgical History  Procedure Date  . Hernia repair     No family history on file.  History  Substance Use Topics  . Smoking status: Current Everyday Smoker -- 0.5 packs/day    Types: Cigarettes  . Smokeless tobacco: Not on file  . Alcohol Use: No   employed as a  Copywriter, advertising  OB History    Grav Para Term Preterm Abortions TAB SAB Ect Mult Living                  Review of Systems  All other systems reviewed and are negative.    Allergies  Shellfish allergy; Codeine; and Sulfamethoxazole States she can't take Vicodin  Home Medications   Current Outpatient Rx  Name Route Sig Dispense Refill  . ALBUTEROL SULFATE HFA 108 (90 BASE) MCG/ACT IN AERS Inhalation Inhale 2-4 puffs into the lungs every 4 (four) hours as needed (wheeze). 1 Inhaler 2  . FLUTICASONE-SALMETEROL 250-50 MCG/DOSE IN AEPB Inhalation Inhale 1 puff into the lungs 2 (two) times daily. 60 each 12  . LORAZEPAM 1 MG PO TABS  Take 1/2 tab by mouth in the event of a panic attack   20 tablet 1  . PANTOPRAZOLE SODIUM 40 MG PO TBEC Oral Take 40 mg by mouth 2 (two) times daily.      Marland Kitchen VALACYCLOVIR HCL 1 G PO TABS Oral Take 0.5 tablets (500 mg total) by mouth daily. 15 tablet 12    BP 122/99  Pulse 104  Resp 24  SpO2 96% Vital signs show mild tachycardia otherwise normal  Physical Exam  Nursing note and vitals reviewed. Constitutional: She is oriented to person, place, and time. She appears well-developed and well-nourished.  Non-toxic appearance. She does not appear ill.  No distress.  HENT:  Head: Normocephalic and atraumatic.  Right Ear: External ear normal.  Left Ear: External ear normal.  Nose: Nose normal. No mucosal edema or rhinorrhea.  Mouth/Throat: Oropharynx is clear and moist and mucous membranes are normal. No dental abscesses or uvula swelling.  Eyes: Conjunctivae and EOM are normal. Pupils are equal, round, and reactive to light.  Neck: Normal range of motion and full passive range of motion without pain. Neck supple.  Cardiovascular: Normal rate, regular rhythm and normal heart sounds.  Exam reveals no gallop and no friction rub.   No murmur heard. Pulmonary/Chest: She has no wheezes. She has no rhonchi. She has no rales. She exhibits no tenderness and no  crepitus.       Patient appears to be tachypnea with exaggerated deep breathing. She has diffuse decreased breath sounds, I do not hear any obvious wheezes or rhonchi.  Abdominal: Soft. Normal appearance and bowel sounds are normal. She exhibits no distension. There is no tenderness. There is no rebound and no guarding.  Musculoskeletal: Normal range of motion. She exhibits no edema and no tenderness.       Moves all extremities well.   Neurological: She is alert and oriented to person, place, and time. She has normal strength. No cranial nerve deficit.  Skin: Skin is warm, dry and intact. No rash noted. No erythema. No pallor.  Psychiatric: Her speech is normal and behavior is normal. Her mood appears anxious.    ED Course  Procedures (including critical care time)  Reviewing patient's chart history. [facility] and she had a outpatient chest x-ray ordered but she has not shown up for yet. Also is clearly documented in the chart we did not put her on antibiotics. She also had been referred to ENT and missed several appointments with them to the point they said if she missed another appointment they would not see her again. She also appears to have been referred to a pulmonologist for a low oxygen level that I do not see a note related to that evaluation.  I discussed her chest x-ray reading with Dr. Consuello Closs. She states the fibrotic changes were actually present on her prior x-rays in August and September however they were not reported.  Patient was given albuterol/Atrovent nebulizer treatment. She was rechecked she had much improved air movement still does not have wheezing or rhonchi. Her pulse ox is 97% on room air. Patient was given oral prednisone which she states has helped in the past, she was given a second nebulizer and now she feels ready to go home.  Labs Reviewed - No data to display Dg Chest 2 View  11/01/2011  *RADIOLOGY REPORT*  Clinical  Data: Shortness of breath, cough, fever, smoker  CHEST - 2 VIEW  Comparison: July 14, 2011  Findings: The cardiac silhouette, mediastinum, pulmonary vasculature are within normal limits.  Both lungs are clear except for diffuse chronic interstitial fibrotic change.   There is no acute bony abnormality.  IMPRESSION: Chronic interstitial fibrotic disease.  There is no evidence of acute cardiac or pulmonary process.  Original Report Authenticated By: Brandon Melnick, M.D.     Diagnoses that have been ruled out:  Diagnoses that are still under consideration:  Final diagnoses:  Bronchitis  Asthma  Chest wall pain   Patient's Medications  New Prescriptions   DOXYCYCLINE (VIBRAMYCIN) 100 MG CAPSULE    Take 1 capsule (100 mg total) by mouth  2 (two) times daily.   PREDNISONE (DELTASONE) 20 MG TABLET    Take 3 po QD x 2d starting tomorrow, then 2 po QD x 3d then 1 po QD x 3d    Plan discharge  Devoria Albe, MD, Armando Gang     MDM          Ward Givens, MD 11/01/11 1131

## 2011-11-01 NOTE — ED Notes (Signed)
Pt reports shortness of breath since 4am. Has used albuterol inh and advair without relief. 100% sats in triage, feels like the "air isn't getting to her lungs." Has been recently treated for possible PNA.

## 2011-11-01 NOTE — ED Notes (Signed)
Pt given discharge info stated understanding amb indep to discharge window.

## 2011-11-02 ENCOUNTER — Other Ambulatory Visit: Payer: Self-pay | Admitting: Family Medicine

## 2011-11-02 NOTE — Telephone Encounter (Signed)
Was in [facility] ED for asthma and bronchitis. She was given doxycycline and prednisone. She is at the pharmacy she has not had a refill for albuterol. He should not get her refill for albuterol she may the back in the ED. I told her that we do not usually give prescriptions over the phone, however normal in that at this time because he did not want her back in ED. I filled her albuterol prescription. I told her that we do not give extra refills of albuterol because he would like you to be seen frequently and she is using it. Advised to make an appointment with her PCP.

## 2011-11-11 DIAGNOSIS — F32A Depression, unspecified: Secondary | ICD-10-CM | POA: Insufficient documentation

## 2011-11-11 DIAGNOSIS — F419 Anxiety disorder, unspecified: Secondary | ICD-10-CM

## 2011-11-11 DIAGNOSIS — J309 Allergic rhinitis, unspecified: Secondary | ICD-10-CM | POA: Insufficient documentation

## 2011-11-11 DIAGNOSIS — F329 Major depressive disorder, single episode, unspecified: Secondary | ICD-10-CM | POA: Insufficient documentation

## 2011-11-11 HISTORY — DX: Anxiety disorder, unspecified: F41.9

## 2012-03-04 ENCOUNTER — Telehealth: Payer: Self-pay | Admitting: Pulmonary Disease

## 2012-03-04 NOTE — Telephone Encounter (Signed)
Was contacted by Cecelia Byars, Pulmonary Site Manager; to reactivate a patient who was previously dismissed by Dr. Kriste Basque

## 2012-04-01 ENCOUNTER — Institutional Professional Consult (permissible substitution): Payer: Medicaid Other | Admitting: Emergency Medicine

## 2012-04-22 ENCOUNTER — Emergency Department (HOSPITAL_COMMUNITY): Payer: Medicaid Other

## 2012-04-22 ENCOUNTER — Encounter (HOSPITAL_COMMUNITY): Payer: Self-pay | Admitting: *Deleted

## 2012-04-22 ENCOUNTER — Emergency Department (HOSPITAL_COMMUNITY)
Admission: EM | Admit: 2012-04-22 | Discharge: 2012-04-22 | Disposition: A | Discharge status: Home / Self Care | Payer: Medicaid Other | Attending: Emergency Medicine | Admitting: Emergency Medicine

## 2012-04-22 DIAGNOSIS — R002 Palpitations: Secondary | ICD-10-CM | POA: Insufficient documentation

## 2012-04-22 DIAGNOSIS — F411 Generalized anxiety disorder: Secondary | ICD-10-CM | POA: Insufficient documentation

## 2012-04-22 DIAGNOSIS — J441 Chronic obstructive pulmonary disease with (acute) exacerbation: Secondary | ICD-10-CM

## 2012-04-22 DIAGNOSIS — J45901 Unspecified asthma with (acute) exacerbation: Secondary | ICD-10-CM | POA: Insufficient documentation

## 2012-04-22 DIAGNOSIS — R079 Chest pain, unspecified: Secondary | ICD-10-CM | POA: Insufficient documentation

## 2012-04-22 DIAGNOSIS — IMO0001 Reserved for inherently not codable concepts without codable children: Secondary | ICD-10-CM | POA: Insufficient documentation

## 2012-04-22 DIAGNOSIS — K219 Gastro-esophageal reflux disease without esophagitis: Secondary | ICD-10-CM | POA: Insufficient documentation

## 2012-04-22 LAB — BASIC METABOLIC PANEL
BUN: 11 mg/dL (ref 6–23)
CO2: 22 mEq/L (ref 19–32)
Calcium: 9.7 mg/dL (ref 8.4–10.5)
Chloride: 103 mEq/L (ref 96–112)
Creatinine, Ser: 0.49 mg/dL — ABNORMAL LOW (ref 0.50–1.10)
GFR calc Af Amer: >90 mL/min (ref >90)
GFR calc non Af Amer: >90 mL/min (ref >90)
Glucose, Bld: 123 mg/dL — ABNORMAL HIGH (ref 70–99)
Potassium: 4 mEq/L (ref 3.5–5.1)
Sodium: 138 mEq/L (ref 135–145)

## 2012-04-22 LAB — DIFFERENTIAL
Basophils Absolute: 0 10*3/uL (ref 0.0–0.1)
Basophils Relative: 0 % (ref 0–1)
Eosinophils Absolute: 0 10*3/uL (ref 0.0–0.7)
Eosinophils Relative: 0 % (ref 0–5)
Lymphocytes Relative: 16 % (ref 12–46)
Lymphs Abs: 2.4 10*3/uL (ref 0.7–4.0)
Monocytes Absolute: 0.7 10*3/uL (ref 0.1–1.0)
Monocytes Relative: 5 % (ref 3–12)
Neutro Abs: 11.8 10*3/uL — ABNORMAL HIGH (ref 1.7–7.7)
Neutrophils Relative %: 79 % — ABNORMAL HIGH (ref 43–77)

## 2012-04-22 LAB — CBC
HCT: 44.9 % (ref 36.0–46.0)
Hemoglobin: 15.2 g/dL — ABNORMAL HIGH (ref 12.0–15.0)
MCH: 31.5 pg (ref 26.0–34.0)
MCHC: 33.9 g/dL (ref 30.0–36.0)
MCV: 93.2 fL (ref 78.0–100.0)
Platelets: 288 10*3/uL (ref 150–400)
RBC: 4.82 MIL/uL (ref 3.87–5.11)
RDW: 13.2 % (ref 11.5–15.5)
WBC: 14.9 10*3/uL — ABNORMAL HIGH (ref 4.0–10.5)

## 2012-04-22 LAB — URINALYSIS, ROUTINE W REFLEX MICROSCOPIC
Bilirubin Urine: NEGATIVE
Glucose, UA: NEGATIVE mg/dL
Hgb urine dipstick: NEGATIVE
Ketones, ur: NEGATIVE mg/dL
Leukocytes, UA: NEGATIVE
Nitrite: NEGATIVE
Protein, ur: NEGATIVE mg/dL
Specific Gravity, Urine: 1.012 (ref 1.005–1.030)
Urobilinogen, UA: 0.2 mg/dL (ref 0.0–1.0)
pH: 6.5 (ref 5.0–8.0)

## 2012-04-22 LAB — TROPONIN I
Troponin I: <0.3 ng/mL (ref <0.30)
Troponin I: <0.3 ng/mL (ref <0.30)

## 2012-04-22 LAB — D-DIMER, QUANTITATIVE: D-Dimer, Quant: <0.22 ug/mL-FEU (ref 0.00–0.48)

## 2012-04-22 MED ORDER — ALBUTEROL SULFATE (5 MG/ML) 0.5% IN NEBU
5.0000 mg | INHALATION_SOLUTION | Freq: Once | RESPIRATORY_TRACT | Status: AC
  Administered 2012-04-22: 5 mg via RESPIRATORY_TRACT
  Filled 2012-04-22: qty 1

## 2012-04-22 MED ORDER — IPRATROPIUM BROMIDE 0.02 % IN SOLN
0.5000 mg | Freq: Once | RESPIRATORY_TRACT | Status: AC
  Administered 2012-04-22: 0.5 mg via RESPIRATORY_TRACT
  Filled 2012-04-22: qty 2.5

## 2012-04-22 MED ORDER — PREDNISONE 20 MG PO TABS
60.0000 mg | ORAL_TABLET | Freq: Once | ORAL | Status: AC
  Administered 2012-04-22: 60 mg via ORAL
  Filled 2012-04-22: qty 3

## 2012-04-22 NOTE — ED Notes (Signed)
Pt brought in via ems and taken to room 15. Pt with 20 iv in left ac. Pt woke up with chest tightness. Pt was given 324 mg asa and 0.4 mg nitro sl.

## 2012-04-22 NOTE — ED Notes (Signed)
Notified respiratory of pt medication need.

## 2012-04-22 NOTE — ED Notes (Signed)
Pt states she started taking 3 new meds this week... Cymbalta, augmentin, and prednisone. Pt states last night she had some chest pain but not this bad.

## 2012-04-22 NOTE — Discharge Instructions (Signed)
Read instructions below for reasons to return to the Emergency Department. It is recommended that your follow up with your Primary Care Doctor in regards to today's visit.   Chest Pain (Nonspecific)  HOME CARE INSTRUCTIONS  For the next few days, avoid physical activities that bring on chest pain. Continue physical activities as directed.  Do not smoke cigarettes or drink alcohol until your symptoms are gone.  Only take over-the-counter or prescription medicine for pain, discomfort, or fever as directed by your caregiver.  Follow your caregiver's suggestions for further testing if your chest pain does not go away.  Keep any follow-up appointments you made. If you do not go to an appointment, you could develop lasting (chronic) problems with pain. If there is any problem keeping an appointment, you must call to reschedule.  SEEK MEDICAL CARE IF:  You think you are having problems from the medicine you are taking. Read your medicine instructions carefully.  Your chest pain does not go away, even after treatment.  You develop a rash with blisters on your chest.  SEEK IMMEDIATE MEDICAL CARE IF:  You have increased chest pain or pain that spreads to your arm, neck, jaw, back, or belly (abdomen).  You develop shortness of breath, an increasing cough, or you are coughing up blood.  You have severe back or abdominal pain, feel sick to your stomach (nauseous) or throw up (vomit).  You develop severe weakness, fainting, or chills.  You have an oral temperature above 102 F (38.9 C), not controlled by medicine.   THIS IS AN EMERGENCY. Do not wait to see if the pain will go away. Get medical help at once. Call your local emergency services (911 in [place] Do not drive yourself to the hospital.   RESOURCE GUIDE  Dental Problems  Patients with Medicaid: [facility] [phone]                                           [phone] [address] Phone:  [phone]                                                  Phone:  [phone]  If unable to pay or uninsured, contact:  [facility] or [facility]. to become qualified for the adult dental clinic.  Chronic Pain Problems Contact [facility]  [phone] Patients need to be referred by their primary care doctor.  Insufficient Money for Medicine Contact United Way:  call "211" or [facility] 6706359868.  No Primary Care Doctor Call [facility]  [phone] Other agencies that provide inexpensive medical care    [facility]  [phone]    [facility]  [phone]    [facility]  4180882757    [facility]  [phone]    [facility]  9306873498    [facility]  [phone]  Psychological Services [facility]  [phone] [facility]  [phone] [facility]   [phone] (emergency services [phone])  Substance Abuse Resources [facility]  6398423874 [facility] [phone] [facility] [place] [phone] Floydene Flock [phone] Residential & Outpatient Substance Abuse Program  [phone]  Abuse/Neglect [facility] Child Abuse Hotline 951-321-6359 [facility] Child Abuse Hotline 803-783-0425 (After Hours)  Emergency Shelter [facility] [phone]  Maternity Homes [facility] [place] [facility] [phone] [facility] [phone]  MRSA Hotline #:   873-636-7879    [facility] Resources  [facility] [place]     [employer]                          [facility] Dept. [address] [place]                       [address]      [address] [place] [address]  Blondell Reveal Phone:  [phone]                                   Phone:  [phone]                  Phone:  [phone]  [facility] Phone:  [phone]  [facility] Child Abuse Hotline (623)[phone] (After Hours)

## 2012-04-22 NOTE — ED Notes (Signed)
MD at bedside. 

## 2012-04-22 NOTE — ED Provider Notes (Signed)
History     CSN: [id number]  Arrival date & time 04/22/12  0547   First MD Initiated Contact with Patient 04/22/12 [phone]      Chief Complaint  Patient presents with  . Chest Pain    (Consider location/radiation/quality/duration/timing/severity/associated sxs/prior treatment) HPI Comments: Patient reports that she woke up at 3 AM this morning with chest pain.  Pain associated with SOB and diaphoresis.  Pain located across her entire chest.  She describes the pain as a "tightness" and a "squeezing" pain.  Pain does not radiate.  She took an Ativan at the onset of the pain because she felt that the pain was most likely related to anxiety.  Patient has a history of Anxiety.  She did not feel that the ativan helped the pain and then called EMS.  She was given one SL Nitroglycerin and 324 mg Aspirin by EMS.   She does not have a history of HTN, Hyperlipidemia, or DM.  Patient smokes 1ppd for 20 years.  She does report that her father had a fatal MI at age 4.  Patient denies surgery in the past 4 weeks, denies prolonged travel in the past 4 weeks, denies LE pain or edema, denies any estrogen containing medications, no prior history of DVT or PE.  She also reports that she had a cardiac cath in 2005 or 2006.  Patient reports that it was normal at that time.  She does have a PCP named [facility].  The history is provided by the patient.    Past Medical History  Diagnosis Date  . GERD (gastroesophageal reflux disease)   . Fibromyalgia   . Anxiety   . Asthma     Past Surgical History  Procedure Date  . Hernia repair     History reviewed. No pertinent family history.  History  Substance Use Topics  . Smoking status: Current Everyday Smoker -- 0.5 packs/day    Types: Cigarettes  . Smokeless tobacco: Not on file  . Alcohol Use: No    OB History    Grav Para Term Preterm Abortions TAB SAB Ect Mult Living                  Review of Systems  Constitutional: Positive for  diaphoresis. Negative for fever and chills.  Respiratory: Positive for cough, chest tightness and shortness of breath. Negative for wheezing.   Cardiovascular: Positive for palpitations. Negative for chest pain and leg swelling.  Gastrointestinal: Negative for nausea, vomiting and abdominal pain.  Musculoskeletal: Negative for gait problem.  Skin: Negative for rash.  Neurological: Negative for dizziness, syncope, light-headedness and headaches.    Allergies  Shellfish allergy; Codeine; and Sulfamethoxazole  Home Medications   Current Outpatient Rx  Name Route Sig Dispense Refill  . AMOXICILLIN-POT CLAVULANATE 875-125 MG PO TABS Oral Take 1 tablet by mouth 2 (two) times daily. For 14 days on day three of the therapy now    . DULOXETINE HCL 30 MG PO CPEP Oral Take 30 mg by mouth daily.    Marland Kitchen FLUTICASONE-SALMETEROL 250-50 MCG/DOSE IN AEPB Inhalation Inhale 1 puff into the lungs 2 (two) times daily. 60 each 12  . IBUPROFEN 600 MG PO TABS Oral Take 600 mg by mouth every 6 (six) hours as needed. For pain    . LORAZEPAM 1 MG PO TABS Oral Take 0.25 mg by mouth every 6 (six) hours as needed. Takes 1/4 of tablet at a time        . PANTOPRAZOLE  SODIUM 40 MG PO TBEC Oral Take 40 mg by mouth 2 (two) times daily. heartburn    . PREDNISONE 20 MG PO TABS Oral Take 20 mg by mouth daily. For seven days    . VENTOLIN HFA 108 (90 BASE) MCG/ACT IN AERS  INHALE 2-4 PUFFS INTO THE LUNGS EVERY 4 (FOUR) HOURS AS NEEDED (WHEEZE). 1 Inhaler 0  . VALACYCLOVIR HCL 1 G PO TABS Oral Take 500 mg by mouth daily. Flare up       BP 104/74  Pulse 72  Temp(Src) 98.5 F (36.9 C) (Oral)  Resp 18  Ht 5\' 3"  (1.6 m)  Wt 154 lb (69.854 kg)  BMI 27.28 kg/m2  SpO2 92%  Physical Exam  Nursing note and vitals reviewed. Constitutional: She appears well-developed and well-nourished. No distress.  HENT:  Head: Normocephalic and atraumatic.  Mouth/Throat: Oropharynx is clear and moist.  Neck: Normal range of motion. Neck  supple.  Cardiovascular: Normal rate, regular rhythm, normal heart sounds and intact distal pulses.        No peripheral edema  Pulmonary/Chest: Effort normal and breath sounds normal. No respiratory distress. She has no wheezes. She has no rales. She exhibits tenderness.       Chest tender to palpation  Musculoskeletal: Normal range of motion.  Neurological: She is alert.  Skin: Skin is warm and dry. No rash noted. She is not diaphoretic.  Psychiatric: She has a normal mood and affect.    ED Course  Procedures (including critical care time)  Labs Reviewed  CBC - Abnormal; Notable for the following:    WBC 14.9 (*)    Hemoglobin 15.2 (*)    All other components within normal limits  DIFFERENTIAL - Abnormal; Notable for the following:    Neutrophils Relative 79 (*)    Neutro Abs 11.8 (*)    All other components within normal limits  TROPONIN I  BASIC METABOLIC PANEL  D-DIMER, QUANTITATIVE   No results found.   No diagnosis found.   Date: 04/22/2012  Rate: 65 bpm  Rhythm: normal sinus rhythm  QRS Axis: normal  Intervals: normal  ST/T Wave abnormalities: normal  Conduction Disutrbances:none  Narrative Interpretation:   Old EKG Reviewed: changes noted, old EKG showing ST depression which is not present on EKG today  8:04 AM Reassessed patient after breathing treatment.  She reports that her symptoms have improved at this time.  Patient oxygenating at 96 on RA. 10:45 AM Reassessed patient after 2nd breathing treatment.  Patient reports that her chest pain has improved.  Patient oxygenating at 96 on RA.  MDM  Patient is to be discharged with recommendation to follow up with PCP in regards to today's hospital visit. Chest pain is not likely of cardiac etiology.   EKG without acute abnormalities, negative troponin initial and 3 hour troponin.  Patient with no acute changes on CXR.   Pain improved after she was given two breathing treatments and 60mg  Prednisone while in the ED.  Initially patient was hypoxic with a pulse ox of 92 on RA.  However, after breathing treatments she was sating at 96 on RA.  D-dimer negative, therefore, feel that PE is unlikely.  Pt appears reliable for follow up and is agreeable to discharge.   Case has been discussed with and seen by Dr. Brooke Dare.        Pascal Lux [place], PA-C 04/23/12 [phone]

## 2012-04-24 NOTE — ED Provider Notes (Signed)
Medical screening examination/treatment/procedure(s) were performed by non-physician practitioner and as supervising physician I was immediately available for consultation/collaboration.   Kareem Aul, MD 04/24/12 2002 

## 2012-05-12 ENCOUNTER — Institutional Professional Consult (permissible substitution): Payer: Medicaid Other | Admitting: Emergency Medicine

## 2012-07-03 ENCOUNTER — Encounter: Payer: Self-pay | Admitting: *Deleted

## 2012-07-04 ENCOUNTER — Institutional Professional Consult (permissible substitution): Payer: Medicaid Other | Admitting: Emergency Medicine

## 2012-07-10 ENCOUNTER — Encounter: Payer: Self-pay | Admitting: Internal Medicine

## 2012-07-10 ENCOUNTER — Ambulatory Visit (INDEPENDENT_AMBULATORY_CARE_PROVIDER_SITE_OTHER): Payer: Medicaid Other | Admitting: Internal Medicine

## 2012-07-10 VITALS — BP 122/80 | HR 93 | Temp 97.6°F | Ht 63.0 in | Wt 158.0 lb

## 2012-07-10 DIAGNOSIS — F17200 Nicotine dependence, unspecified, uncomplicated: Secondary | ICD-10-CM

## 2012-07-10 DIAGNOSIS — R0602 Shortness of breath: Secondary | ICD-10-CM

## 2012-07-10 MED ORDER — MOMETASONE FURO-FORMOTEROL FUM 200-5 MCG/ACT IN AERO: INHALATION_SPRAY | RESPIRATORY_TRACT | Status: DC

## 2012-07-10 NOTE — Patient Instructions (Addendum)
The key is to stop smoking completely before smoking completely stops you!   Please schedule a follow up office visit in 6 weeks, call sooner if needed with pfts 

## 2012-07-10 NOTE — Progress Notes (Signed)
  Subjective:    Patient ID: Sheri Rivera, female    DOB: [birth year]  MRN: [id number]  HPI  5 yowf smoker grew up in house of smoker with freq asthma problems couldn't run @ gym in 7th grade started smoking mid 20s with worse breathing problems since 2010 referred 07/10/2012 to pulmonary clinic by Dr Catalina Pizza [facility] [place].  07/10/2012 1st pulmonary ov cc  Worsening sob x 3 years highly variable to point where does ok x 3 days and ov   typical bad day with last neb 5 and half hours prior to ov, not sure it helped.  Last prednisone opened her up x 2 weeks after to point where can take kids to park. Does not take advair as maint because worried it causes pneumonia.   No unusual cough, purulent sputum or Reflux symptoms on present rx.   Presenlty Sleeping ok without nocturnal  or early am exacerbation  of respiratory  c/o's or need for noct saba. Also denies any obvious fluctuation of symptoms with weather or environmental changes or other aggravating or alleviating factors except as outlined above    Review of Systems  Constitutional: Negative for fever and unexpected weight change.  HENT: Positive for ear pain, congestion, sore throat, rhinorrhea, sneezing, dental problem, postnasal drip and sinus pressure. Negative for nosebleeds and trouble swallowing.   Eyes: Positive for redness and itching.  Respiratory: Positive for cough, chest tightness, shortness of breath and wheezing.   Cardiovascular: Positive for leg swelling. Negative for palpitations.  Gastrointestinal: Positive for nausea. Negative for vomiting.  Genitourinary: Negative for dysuria.  Musculoskeletal: Positive for joint swelling.  Skin: Negative for rash.  Neurological: Positive for headaches.  Hematological: Bruises/bleeds easily.  Psychiatric/Behavioral: Positive for dysphoric mood. The patient is nervous/anxious.        Objective:   Physical Exam  Wt Readings from Last 3 Encounters:  07/10/12  158 lb (71.668 kg)  04/22/12 154 lb (69.854 kg)  09/28/11 163 lb (73.936 kg)  HEENT mild turbinate edema.  Oropharynx no thrush or excess pnd or cobblestoning.  No JVD or cervical adenopathy.  No  accessory muscle hypertrophy. Trachea midline, nl thryroid. Chest was min hyperinflated by percussion with slt  diminished breath sounds and min increased exp time without wheeze. Hoover sign positive at  end inspiration. Regular rate and rhythm without murmur gallop or rub or increase P2 or edema.  Abd: no hsm, nl excursion. Ext warm without cyanosis or clubbing.    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

## 2012-07-13 ENCOUNTER — Encounter: Payer: Self-pay | Admitting: Internal Medicine

## 2012-07-13 NOTE — Assessment & Plan Note (Addendum)
Symptoms are markedly disproportionate to objective findings and not clear this is a lung problem but pt does appear to have difficult airway management issues.  DDX of  difficult airways managment all start with A and  include Adherence, Ace Inhibitors, Acid Reflux, Active Sinus Disease, Alpha 1 Antitripsin deficiency, Anxiety masquerading as Airways dz,  ABPA,  allergy(esp in young), Aspiration (esp in elderly), Adverse effects of DPI,  Active smokers, plus two Bs  = Bronchiectasis and Beta blocker use..and one C= CHF  Active smokng greatest concern, discussed separately  Adherence is always a  "prime suspect" and is a multilayered concern that requires a "trust but verify" approach in every patient - starting with knowing how to use medications, especially inhalers, correctly, keeping up with refills and understanding the fundamental difference between maintenance and prns vs those medications only taken for a very short course and then stopped and not refilled.   The proper method of use, as well as anticipated side effects, of a metered-dose inhaler are discussed and demonstrated to the patient. Improved effectiveness after extensive coaching during this visit to a level of approximately  75% so worth trying dulera 200 Take 2 puffs first thing in am and then another 2 puffs about 12 hours later pending f/u with pft's.

## 2012-07-13 NOTE — Assessment & Plan Note (Signed)
>   3 min discussion  I  reviewed the Flethcher curve with patient that basically indicates  if you quit smoking when your best day FEV1 is still well preserved(which I believe it is in her case, pft's pending )  it is highly unlikely you will progress to severe disease and informed the patient there was no medication on the market that has proven to change the curve or the likelihood of progression.  Therefore stopping smoking and maintaining abstinence is the most important aspect of care, not choice of inhalers or for that matter, doctors.

## 2012-07-21 DIAGNOSIS — IMO0002 Reserved for concepts with insufficient information to code with codable children: Secondary | ICD-10-CM | POA: Insufficient documentation

## 2012-07-21 DIAGNOSIS — B977 Papillomavirus as the cause of diseases classified elsewhere: Secondary | ICD-10-CM | POA: Insufficient documentation

## 2012-08-27 ENCOUNTER — Encounter: Payer: Medicaid Other | Admitting: Internal Medicine

## 2012-08-27 NOTE — Progress Notes (Signed)
 This encounter was created in error - please disregard.

## 2012-08-29 ENCOUNTER — Emergency Department (HOSPITAL_COMMUNITY): Payer: Medicaid Other

## 2012-08-29 ENCOUNTER — Encounter (HOSPITAL_COMMUNITY): Payer: Self-pay | Admitting: Emergency Medicine

## 2012-08-29 ENCOUNTER — Emergency Department (HOSPITAL_COMMUNITY)
Admission: EM | Admit: 2012-08-29 | Discharge: 2012-08-29 | Disposition: A | Discharge status: Home / Self Care | Payer: Medicaid Other | Attending: Emergency Medicine | Admitting: Emergency Medicine

## 2012-08-29 ENCOUNTER — Other Ambulatory Visit: Payer: Self-pay

## 2012-08-29 DIAGNOSIS — IMO0001 Reserved for inherently not codable concepts without codable children: Secondary | ICD-10-CM | POA: Insufficient documentation

## 2012-08-29 DIAGNOSIS — N390 Urinary tract infection, site not specified: Secondary | ICD-10-CM

## 2012-08-29 DIAGNOSIS — F17200 Nicotine dependence, unspecified, uncomplicated: Secondary | ICD-10-CM | POA: Insufficient documentation

## 2012-08-29 DIAGNOSIS — Z79899 Other long term (current) drug therapy: Secondary | ICD-10-CM | POA: Insufficient documentation

## 2012-08-29 DIAGNOSIS — R0602 Shortness of breath: Secondary | ICD-10-CM | POA: Insufficient documentation

## 2012-08-29 DIAGNOSIS — K219 Gastro-esophageal reflux disease without esophagitis: Secondary | ICD-10-CM | POA: Insufficient documentation

## 2012-08-29 DIAGNOSIS — IMO0002 Reserved for concepts with insufficient information to code with codable children: Secondary | ICD-10-CM | POA: Insufficient documentation

## 2012-08-29 DIAGNOSIS — F329 Major depressive disorder, single episode, unspecified: Secondary | ICD-10-CM | POA: Insufficient documentation

## 2012-08-29 DIAGNOSIS — J45909 Unspecified asthma, uncomplicated: Secondary | ICD-10-CM | POA: Insufficient documentation

## 2012-08-29 DIAGNOSIS — R0600 Dyspnea, unspecified: Secondary | ICD-10-CM

## 2012-08-29 DIAGNOSIS — F411 Generalized anxiety disorder: Secondary | ICD-10-CM | POA: Insufficient documentation

## 2012-08-29 DIAGNOSIS — E049 Nontoxic goiter, unspecified: Secondary | ICD-10-CM | POA: Insufficient documentation

## 2012-08-29 DIAGNOSIS — F3289 Other specified depressive episodes: Secondary | ICD-10-CM | POA: Insufficient documentation

## 2012-08-29 LAB — APTT: aPTT: 28 seconds (ref 24–37)

## 2012-08-29 LAB — TROPONIN I: Troponin I: <0.3 ng/mL (ref <0.30)

## 2012-08-29 LAB — URINE MICROSCOPIC-ADD ON

## 2012-08-29 LAB — COMPREHENSIVE METABOLIC PANEL
ALT: 11 U/L (ref 0–35)
AST: 15 U/L (ref 0–37)
Albumin: 3.6 g/dL (ref 3.5–5.2)
Alkaline Phosphatase: 92 U/L (ref 39–117)
BUN: 11 mg/dL (ref 6–23)
CO2: 22 mEq/L (ref 19–32)
Calcium: 9.7 mg/dL (ref 8.4–10.5)
Chloride: 104 mEq/L (ref 96–112)
Creatinine, Ser: 0.58 mg/dL (ref 0.50–1.10)
GFR calc Af Amer: >90 mL/min (ref >90)
GFR calc non Af Amer: >90 mL/min (ref >90)
Glucose, Bld: 102 mg/dL — ABNORMAL HIGH (ref 70–99)
Potassium: 3.8 mEq/L (ref 3.5–5.1)
Sodium: 139 mEq/L (ref 135–145)
Total Bilirubin: 0.3 mg/dL (ref 0.3–1.2)
Total Protein: 7 g/dL (ref 6.0–8.3)

## 2012-08-29 LAB — POCT I-STAT, CHEM 8
BUN: 12 mg/dL (ref 6–23)
Calcium, Ion: 1.16 mmol/L (ref 1.12–1.23)
Chloride: 109 mEq/L (ref 96–112)
Creatinine, Ser: 0.7 mg/dL (ref 0.50–1.10)
Glucose, Bld: 97 mg/dL (ref 70–99)
HCT: 46 % (ref 36.0–46.0)
Hemoglobin: 15.6 g/dL — ABNORMAL HIGH (ref 12.0–15.0)
Potassium: 4 mEq/L (ref 3.5–5.1)
Sodium: 140 mEq/L (ref 135–145)
TCO2: 21 mmol/L (ref 0–100)

## 2012-08-29 LAB — URINALYSIS, ROUTINE W REFLEX MICROSCOPIC
Bilirubin Urine: NEGATIVE
Glucose, UA: NEGATIVE mg/dL
Hgb urine dipstick: NEGATIVE
Ketones, ur: NEGATIVE mg/dL
Nitrite: NEGATIVE
Protein, ur: NEGATIVE mg/dL
Specific Gravity, Urine: 1.011 (ref 1.005–1.030)
Urobilinogen, UA: 0.2 mg/dL (ref 0.0–1.0)
pH: 7.5 (ref 5.0–8.0)

## 2012-08-29 LAB — CBC
HCT: 44.9 % (ref 36.0–46.0)
Hemoglobin: 15 g/dL (ref 12.0–15.0)
MCH: 30.5 pg (ref 26.0–34.0)
MCHC: 33.4 g/dL (ref 30.0–36.0)
MCV: 91.3 fL (ref 78.0–100.0)
Platelets: 283 10*3/uL (ref 150–400)
RBC: 4.92 MIL/uL (ref 3.87–5.11)
RDW: 13.4 % (ref 11.5–15.5)
WBC: 13.1 10*3/uL — ABNORMAL HIGH (ref 4.0–10.5)

## 2012-08-29 LAB — D-DIMER, QUANTITATIVE: D-Dimer, Quant: <0.27 ug/mL-FEU (ref 0.00–0.48)

## 2012-08-29 MED ORDER — IPRATROPIUM BROMIDE 0.02 % IN SOLN
0.5000 mg | Freq: Once | RESPIRATORY_TRACT | Status: AC
  Administered 2012-08-29: 0.5 mg via RESPIRATORY_TRACT
  Filled 2012-08-29: qty 2.5

## 2012-08-29 MED ORDER — PREDNISONE 20 MG PO TABS: 40.0000 mg | ORAL_TABLET | Freq: Every day | ORAL | Status: DC

## 2012-08-29 MED ORDER — ALBUTEROL SULFATE (5 MG/ML) 0.5% IN NEBU
5.0000 mg | INHALATION_SOLUTION | Freq: Once | RESPIRATORY_TRACT | Status: AC
Start: 2012-08-29 — End: 2012-08-29
  Administered 2012-08-29: 5 mg via RESPIRATORY_TRACT
  Filled 2012-08-29: qty 1

## 2012-08-29 MED ORDER — LEVOFLOXACIN 500 MG PO TABS
500.0000 mg | ORAL_TABLET | Freq: Every day | ORAL | Status: DC
  Administered 2012-08-29: 500 mg via ORAL
  Filled 2012-08-29: qty 1

## 2012-08-29 MED ORDER — KETOROLAC TROMETHAMINE 30 MG/ML IJ SOLN: 30.0000 mg | Freq: Once | INTRAMUSCULAR | Status: DC

## 2012-08-29 MED ORDER — LEVOFLOXACIN 500 MG PO TABS: 500.0000 mg | ORAL_TABLET | Freq: Every day | ORAL | Status: DC

## 2012-08-29 NOTE — ED Notes (Signed)
Pt walked out and left 2 children alone in the room unattened notified  Durwin Reges and pt returned

## 2012-08-29 NOTE — Discharge Instructions (Signed)
 Shortness of Breath Shortness of breath means you have trouble breathing. Shortness of breath may indicate that you have a medical problem. You should seek immediate medical care for shortness of breath. CAUSES   Not enough oxygen in the air (as with high altitudes or a smoke-filled room).  Short-term (acute) lung disease, including:  Infections, such as pneumonia.  Fluid in the lungs, such as heart failure.  A blood clot in the lungs (pulmonary embolism).  Long-term (chronic) lung diseases.  Heart disease (heart attack, angina, heart failure, and others).  Low red blood cells (anemia).  Poor physical fitness. This can cause shortness of breath when you exercise.  Chest or back injuries or stiffness.  Being overweight.  Smoking.  Anxiety. This can make you feel like you are not getting enough air. DIAGNOSIS  Serious medical problems can usually be found during your physical exam. Tests may also be done to determine why you are having shortness of breath. Tests may include:  Chest X-rays.  Lung function tests.  Blood tests.  Electrocardiography.  Exercise testing.  Echocardiography.  Imaging scans. Your caregiver may not be able to find a cause for your shortness of breath after your exam. In this case, it is important to have a follow-up exam with your caregiver as directed.  TREATMENT  Treatment for shortness of breath depends on the cause of your symptoms and can vary greatly. HOME CARE INSTRUCTIONS   Do not smoke. Smoking is a common cause of shortness of breath. If you smoke, ask for help to quit.  Avoid being around chemicals or things that may bother your breathing, such as paint fumes and dust.  Rest as needed. Slowly resume your usual activities.  If medicines were prescribed, take them as directed for the full length of time directed. This includes oxygen and any inhaled medicines.  Keep all follow-up appointments as directed by your caregiver. SEEK  MEDICAL CARE IF:   Your condition does not improve in the time expected.  You have a hard time doing your normal activities even with rest.  You have any side effects or problems with the medicines prescribed.  You develop any new symptoms. SEEK IMMEDIATE MEDICAL CARE IF:   Your shortness of breath gets worse.  You feel lightheaded, faint, or develop a cough not controlled with medicines.  You start coughing up blood.  You have pain with breathing.  You have chest pain or pain in your arms, shoulders, or abdomen.  You have a fever.  You are unable to walk up stairs or exercise the way you normally do. MAKE SURE YOU:  Understand these instructions.  Will watch your condition.  Will get help right away if you are not doing well or get worse. Document Released: 08/07/2001 Document Revised: 05/13/2012 Document Reviewed: 01/28/2012 [facility] Patient Information 2013 [place], [place].   Urinary Tract Infection Urinary tract infections (UTIs) can develop anywhere along your urinary tract. Your urinary tract is your body's drainage system for removing wastes and extra water. Your urinary tract includes two kidneys, two ureters, a bladder, and a urethra. Your kidneys are a pair of bean-shaped organs. Each kidney is about the size of your fist. They are located below your ribs, one on each side of your spine. CAUSES Infections are caused by microbes, which are microscopic organisms, including fungi, viruses, and bacteria. These organisms are so small that they can only be seen through a microscope. Bacteria are the microbes that most commonly cause UTIs. SYMPTOMS  Symptoms  of UTIs may vary by age and gender of the patient and by the location of the infection. Symptoms in young women typically include a frequent and intense urge to urinate and a painful, burning feeling in the bladder or urethra during urination. Older women and men are more likely to be tired, shaky, and weak and have  muscle aches and abdominal pain. A fever may mean the infection is in your kidneys. Other symptoms of a kidney infection include pain in your back or sides below the ribs, nausea, and vomiting. DIAGNOSIS To diagnose a UTI, your caregiver will ask you about your symptoms. Your caregiver also will ask to provide a urine sample. The urine sample will be tested for bacteria and white blood cells. White blood cells are made by your body to help fight infection. TREATMENT  Typically, UTIs can be treated with medication. Because most UTIs are caused by a bacterial infection, they usually can be treated with the use of antibiotics. The choice of antibiotic and length of treatment depend on your symptoms and the type of bacteria causing your infection. HOME CARE INSTRUCTIONS  If you were prescribed antibiotics, take them exactly as your caregiver instructs you. Finish the medication even if you feel better after you have only taken some of the medication.  Drink enough water and fluids to keep your urine clear or pale yellow.  Avoid caffeine, tea, and carbonated beverages. They tend to irritate your bladder.  Empty your bladder often. Avoid holding urine for long periods of time.  Empty your bladder before and after sexual intercourse.  After a bowel movement, women should cleanse from front to back. Use each tissue only once. SEEK MEDICAL CARE IF:   You have back pain.  You develop a fever.  Your symptoms do not begin to resolve within 3 days. SEEK IMMEDIATE MEDICAL CARE IF:   You have severe back pain or lower abdominal pain.  You develop chills.  You have nausea or vomiting.  You have continued burning or discomfort with urination. MAKE SURE YOU:   Understand these instructions.  Will watch your condition.  Will get help right away if you are not doing well or get worse. Document Released: 08/22/2005 Document Revised: 05/13/2012 Document Reviewed: 12/21/2011 [facility] Patient  Information 2013 [place], [place].

## 2012-08-29 NOTE — ED Notes (Signed)
Resp called for breathing treatment.   

## 2012-08-29 NOTE — ED Notes (Signed)
MD Ghim aware patient refused pain medication and IV at this time. MD made aware of lab delay.  Pt allowed to have water and take 2 of her own Advil per MD Ghim.

## 2012-08-29 NOTE — ED Notes (Signed)
Report given via EMS. Pt c/o SOB that started at 0400 while awaken from sleep and flank pain radiating to back, diffuse chest pain, hyperventilation. Hx of asthma, anxiety. Pt ambulatory. Clear lung sounds. EKG NSR. 98% RA, pt placed on 2L.  Initial VS Hr 72 BP 114/78 RR 20 at 0500.

## 2012-08-29 NOTE — ED Notes (Signed)
Called lab to ask about lab delay on d-dimer. Lab states sample of blood in lab is too old to use.  New sample collected.  Offered to place new IV with blood draw and patient refusing at this time.  Patient also refuses pain medication IV or IM and would like to take her own Advil. Will ask MD.  Pt new blood sample sent to lab. Patient made aware if a CT is required she will need an IV>

## 2012-08-29 NOTE — ED Notes (Signed)
Resp at pt bedside for breathing treatment

## 2012-08-29 NOTE — ED Provider Notes (Signed)
History     CSN: [id number]  Arrival date & time 08/29/12  4098   First MD Initiated Contact with Patient 08/29/12 0710      Chief Complaint  Patient presents with  . Shortness of Breath    (Consider location/radiation/quality/duration/timing/severity/associated sxs/prior treatment) HPI Comments: Pt with h/o asthma, smoker currently, also being evaluated by [facility] for possible COPD and has had multiple episodes of pneumonia in the past, reports last night, was woken up and couldn't catch her breath, reports lips were blue.  No CP.  She admits to anxiety during this.  She reports this same episode has happened in the past once before.  No prior h/o cardiac.  No recent travel, no leg swelling or pain.  She is not on OCP or estrogen.  She reports feeling somewhat improved.  normally takes Flonase for allergies, uses inhaler prn for SOB, cough and wheezing.  Others close to her have a cough also.  She reports no cold symptoms, no N/V/D, no sweats.  She has h/o anxiety as well and takes paxil and ativan prn.  She has a goiter, but told normal function, will undergo biopsy in the near future.  Has a local PCP.    Patient is a 51 y.o. female presenting with shortness of breath. The history is provided by the patient and a relative.  Shortness of Breath  Associated symptoms include cough and shortness of breath. Pertinent negatives include no chest pain, no fever and no sore throat.    Past Medical History  Diagnosis Date  . GERD (gastroesophageal reflux disease)   . Thyroid nodule   . Anxiety 11/11/11  . Asthma   . Herpes   . Seasonal allergies   . Arthritis   . Depression   . Fibromyalgia   . IBS (irritable bowel syndrome)   . DDD (degenerative disc disease)   . Bursitis     right shoulder    Past Surgical History  Procedure Date  . Hernia repair   . Cholecystectomy   . Tubal ligation 1983    Family History  Problem Relation Age of Onset  . Lung cancer Mother   .  Arthritis Mother     Rheumatoid  . Bursitis Mother   . Heart attack Father   . Obesity Father   . Fibromyalgia Sister   . Asthma Mother   . Asthma Child     History  Substance Use Topics  . Smoking status: Current Every Day Smoker    Types: Cigarettes  . Smokeless tobacco: Never Used   Comment: started smoking in 20's.  Currenly smoking 1ppd  . Alcohol Use: No    OB History    Grav Para Term Preterm Abortions TAB SAB Ect Mult Living                  Review of Systems  Constitutional: Negative for fever and chills.  HENT: Positive for congestion. Negative for sore throat.   Respiratory: Positive for cough and shortness of breath.   Cardiovascular: Negative for chest pain and leg swelling.  Gastrointestinal: Negative for nausea and vomiting.  All other systems reviewed and are negative.    Allergies  Shellfish allergy; Codeine; Sulfamethoxazole; and Vicodin  Home Medications   Current Outpatient Rx  Name Route Sig Dispense Refill  . BUPROPION HCL 75 MG PO TABS Oral Take 75 mg by mouth daily.    Marland Kitchen FLUTICASONE PROPIONATE 50 MCG/ACT NA SUSP Nasal Place 2 sprays into the nose  2 (two) times daily.    . IBUPROFEN 600 MG PO TABS Oral Take 600 mg by mouth every 6 (six) hours as needed. For pain     . LORAZEPAM 1 MG PO TABS Oral Take 0.25 mg by mouth every 6 (six) hours as needed. Takes 1/4 of tablet at a time        . MOMETASONE FURO-FORMOTEROL FUM 200-5 MCG/ACT IN AERO Inhalation Inhale 2 puffs into the lungs 2 (two) times daily.    Marland Kitchen PANTOPRAZOLE SODIUM 40 MG PO TBEC Oral Take 40 mg by mouth 2 (two) times daily as needed. heartburn    . PAROXETINE HCL 20 MG PO TABS Oral Take 20 mg by mouth daily.    Marland Kitchen VALACYCLOVIR HCL 1 G PO TABS Oral Take 500 mg by mouth daily.     Marland Kitchen LEVOFLOXACIN 500 MG PO TABS Oral Take 1 tablet (500 mg total) by mouth daily. 7 tablet 0  . PREDNISONE 20 MG PO TABS Oral Take 2 tablets (40 mg total) by mouth daily. 12 tablet 0  . VENTOLIN HFA 108 (90  BASE) MCG/ACT IN AERS  INHALE 2-4 PUFFS INTO THE LUNGS EVERY 4 (FOUR) HOURS AS NEEDED (WHEEZE). 1 Inhaler 0    BP 109/52  Pulse 76  Temp 98 F (36.7 C) (Oral)  Resp 18  SpO2 96%  Physical Exam  Nursing note and vitals reviewed. Constitutional: She appears well-developed and well-nourished. No distress.  HENT:  Head: Normocephalic and atraumatic.  Eyes: Pupils are equal, round, and reactive to light. No scleral icterus.  Neck: Normal range of motion. Neck supple. Thyromegaly present.  Cardiovascular: Normal rate.   Pulmonary/Chest: Effort normal. No respiratory distress. She has no wheezes. She has no rales. She exhibits no tenderness.  Abdominal: Soft. She exhibits no distension. There is no tenderness.  Musculoskeletal: She exhibits no edema and no tenderness.  Neurological: She is alert. Coordination normal.  Skin: Skin is warm.    ED Course  Procedures (including critical care time)  Labs Reviewed  CBC - Abnormal; Notable for the following:    WBC 13.1 (*)     All other components within normal limits  COMPREHENSIVE METABOLIC PANEL - Abnormal; Notable for the following:    Glucose, Bld 102 (*)     All other components within normal limits  URINALYSIS, ROUTINE W REFLEX MICROSCOPIC - Abnormal; Notable for the following:    APPearance CLOUDY (*)     Leukocytes, UA LARGE (*)     All other components within normal limits  POCT I-STAT, CHEM 8 - Abnormal; Notable for the following:    Hemoglobin 15.6 (*)     All other components within normal limits  URINE MICROSCOPIC-ADD ON - Abnormal; Notable for the following:    Bacteria, UA MANY (*)     All other components within normal limits  TROPONIN I  APTT  D-DIMER, QUANTITATIVE  URINE CULTURE   Dg Chest 2 View  08/29/2012  *RADIOLOGY REPORT*  Clinical Data: Shortness of breath.  CHEST - 2 VIEW  Comparison: 04/22/2012  Findings: Two views of the chest demonstrate a diffuse reticular pattern throughout both lungs.  These  findings are chronic.  Stable appearance of the heart and mediastinum.  Trachea is midline.  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  No evidence for acute or focal disease.   Original Report Authenticated By: Richarda Overlie, M.D.      1. Dyspnea   2. Urinary tract infection  RA sat is 96% and I interpret to be normal.    MDM  Pt with no sig wheezing, but dyspnea present wthi h/o wheezing, asthma and pneumonia.  CXR which I also reviewed shows no nifiltrate.  No risks for PE, except possible thryoid problems, ddimer is neg so I doubt PE.  Pt feels improved after neb in the ED.  OTher labs reviewed.  Will d/c home on levaquin to cover both UTI and any bronchitis issues as wellas prednisone.          Gavin Pound. Lamaria Hildebrandt, MD 08/29/12 1006

## 2012-08-29 NOTE — ED Notes (Signed)
Patient reports IV is uncomfortable and wants IV removed.  Pt also asking for pain medication as her back is painful.  MD made aware of patient want for pain medication.  Pt IV removed per her request.  Pt aware that IV may be needed later today and another IV will need to be established.

## 2012-08-29 NOTE — ED Notes (Signed)
ZOX:[id number]<AV> Expected date:08/29/12<BR> Expected time: 5:03 AM<BR> Means of arrival:Ambulance<BR> Comments:<BR> Shortness of breath

## 2012-08-31 LAB — URINE CULTURE: Colony Count: >=100000

## 2012-09-01 NOTE — ED Notes (Signed)
Positive urnc- treated per protocol with levoquin.

## 2012-09-23 ENCOUNTER — Ambulatory Visit: Payer: Medicaid Other | Admitting: Internal Medicine

## 2012-10-05 DIAGNOSIS — E042 Nontoxic multinodular goiter: Secondary | ICD-10-CM | POA: Insufficient documentation

## 2012-10-09 ENCOUNTER — Institutional Professional Consult (permissible substitution): Payer: Medicaid Other | Admitting: Pulmonary Disease

## 2012-10-24 ENCOUNTER — Ambulatory Visit: Payer: Medicaid Other | Admitting: Internal Medicine

## 2012-10-27 ENCOUNTER — Institutional Professional Consult (permissible substitution): Payer: Medicaid Other | Admitting: Pulmonary Disease

## 2012-12-09 ENCOUNTER — Ambulatory Visit: Payer: Medicaid Other | Admitting: Internal Medicine

## 2012-12-14 ENCOUNTER — Emergency Department (HOSPITAL_COMMUNITY): Payer: Medicaid Other

## 2012-12-14 ENCOUNTER — Emergency Department (HOSPITAL_COMMUNITY)
Admission: EM | Admit: 2012-12-14 | Discharge: 2012-12-14 | Disposition: A | Discharge status: Home / Self Care | Payer: Medicaid Other | Attending: Emergency Medicine | Admitting: Emergency Medicine

## 2012-12-14 ENCOUNTER — Encounter (HOSPITAL_COMMUNITY): Payer: Self-pay | Admitting: *Deleted

## 2012-12-14 DIAGNOSIS — J029 Acute pharyngitis, unspecified: Secondary | ICD-10-CM | POA: Insufficient documentation

## 2012-12-14 DIAGNOSIS — F17200 Nicotine dependence, unspecified, uncomplicated: Secondary | ICD-10-CM | POA: Insufficient documentation

## 2012-12-14 DIAGNOSIS — J45909 Unspecified asthma, uncomplicated: Secondary | ICD-10-CM | POA: Insufficient documentation

## 2012-12-14 DIAGNOSIS — R059 Cough, unspecified: Secondary | ICD-10-CM | POA: Insufficient documentation

## 2012-12-14 DIAGNOSIS — F329 Major depressive disorder, single episode, unspecified: Secondary | ICD-10-CM | POA: Insufficient documentation

## 2012-12-14 DIAGNOSIS — Z8639 Personal history of other endocrine, nutritional and metabolic disease: Secondary | ICD-10-CM | POA: Insufficient documentation

## 2012-12-14 DIAGNOSIS — R0789 Other chest pain: Secondary | ICD-10-CM | POA: Insufficient documentation

## 2012-12-14 DIAGNOSIS — F3289 Other specified depressive episodes: Secondary | ICD-10-CM | POA: Insufficient documentation

## 2012-12-14 DIAGNOSIS — Z79899 Other long term (current) drug therapy: Secondary | ICD-10-CM | POA: Insufficient documentation

## 2012-12-14 DIAGNOSIS — IMO0001 Reserved for inherently not codable concepts without codable children: Secondary | ICD-10-CM | POA: Insufficient documentation

## 2012-12-14 DIAGNOSIS — R51 Headache: Secondary | ICD-10-CM | POA: Insufficient documentation

## 2012-12-14 DIAGNOSIS — J3489 Other specified disorders of nose and nasal sinuses: Secondary | ICD-10-CM | POA: Insufficient documentation

## 2012-12-14 DIAGNOSIS — J069 Acute upper respiratory infection, unspecified: Secondary | ICD-10-CM | POA: Insufficient documentation

## 2012-12-14 DIAGNOSIS — Z8739 Personal history of other diseases of the musculoskeletal system and connective tissue: Secondary | ICD-10-CM | POA: Insufficient documentation

## 2012-12-14 DIAGNOSIS — R05 Cough: Secondary | ICD-10-CM | POA: Insufficient documentation

## 2012-12-14 DIAGNOSIS — Z862 Personal history of diseases of the blood and blood-forming organs and certain disorders involving the immune mechanism: Secondary | ICD-10-CM | POA: Insufficient documentation

## 2012-12-14 DIAGNOSIS — H9210 Otorrhea, unspecified ear: Secondary | ICD-10-CM | POA: Insufficient documentation

## 2012-12-14 DIAGNOSIS — R062 Wheezing: Secondary | ICD-10-CM | POA: Insufficient documentation

## 2012-12-14 DIAGNOSIS — B009 Herpesviral infection, unspecified: Secondary | ICD-10-CM | POA: Insufficient documentation

## 2012-12-14 DIAGNOSIS — R0602 Shortness of breath: Secondary | ICD-10-CM | POA: Insufficient documentation

## 2012-12-14 DIAGNOSIS — R0982 Postnasal drip: Secondary | ICD-10-CM | POA: Insufficient documentation

## 2012-12-14 DIAGNOSIS — Z8719 Personal history of other diseases of the digestive system: Secondary | ICD-10-CM | POA: Insufficient documentation

## 2012-12-14 MED ORDER — BENZONATATE 100 MG PO CAPS: 200.0000 mg | ORAL_CAPSULE | Freq: Two times a day (BID) | ORAL | Status: DC | PRN

## 2012-12-14 MED ORDER — AZITHROMYCIN 250 MG PO TABS: 250.0000 mg | ORAL_TABLET | Freq: Every day | ORAL | Status: DC

## 2012-12-14 MED ORDER — IPRATROPIUM BROMIDE 0.03 % NA SOLN: 2.0000 | Freq: Two times a day (BID) | NASAL | Status: DC

## 2012-12-14 MED ORDER — GUAIFENESIN ER 600 MG PO TB12: 1200.0000 mg | ORAL_TABLET | Freq: Two times a day (BID) | ORAL | Status: DC

## 2012-12-14 NOTE — ED Notes (Signed)
Pt escorted to discharge window. Pt verbalized understanding discharge instructions. In no acute distress.  

## 2012-12-14 NOTE — ED Notes (Signed)
Pt has hx of asthma. Pt has had SOB, cough since Wednesday. Pain 7/10 upon inspiration.

## 2012-12-14 NOTE — ED Notes (Signed)
Pt alert and oriented x4. Respirations even and unlabored, bilateral symmetrical rise and fall of chest. Skin warm and dry. In no acute distress. Denies needs.   

## 2012-12-14 NOTE — ED Provider Notes (Signed)
History     CSN: [id number]  Arrival date & time 12/14/12  1452   First MD Initiated Contact with Patient 12/14/12 1531      Chief Complaint  Patient presents with  . Cough  . URI    (Consider location/radiation/quality/duration/timing/severity/associated sxs/prior treatment) Patient is a 52 y.o. female presenting with cough. The history is provided by the patient and medical records. No language interpreter was used.  Cough The current episode started more than 2 days ago. The problem occurs constantly. The problem has been gradually worsening. Associated symptoms include headaches, rhinorrhea, sore throat, shortness of breath and wheezing. Pertinent negatives include no chest pain, no chills and no myalgias.   Sheri Rivera is a 52 y.o. female with a hx of asthma, thyroid disease, seasonal allergies, fibromyalgia presents to the Emergency Department complaining of persistent, progressively worsening cough and congestion onset 4 days ago. Patient states her daughter and other children whom have been sick with upper respiratory infections in the last week.  Associated symptoms include myalgias, shortness of breath, nasal congestion, and dry cough. Pt denies fever, chills, diaphoresis, headache, neck pain, chest pain, abdominal pain, nausea, vomiting, as, dizziness, syncope.  Pt says she used a nebulizer , MDI inhaler and mucinex but no relief from symptoms. She says she already used 2 albuterol treatments today. Pt states her granddaughter had an ear infection during christmas. Pt was a smoker for 20 years.  She states the shortness of breath is different   Past Medical History  Diagnosis Date  . GERD (gastroesophageal reflux disease)   . Thyroid nodule   . Anxiety 11/11/11  . Asthma   . Herpes   . Seasonal allergies   . Arthritis   . Depression   . Fibromyalgia   . IBS (irritable bowel syndrome)   . DDD (degenerative disc disease)   . Bursitis     right shoulder    Past  Surgical History  Procedure Date  . Hernia repair   . Cholecystectomy   . Tubal ligation 1983    Family History  Problem Relation Age of Onset  . Lung cancer Mother   . Arthritis Mother     Rheumatoid  . Bursitis Mother   . Heart attack Father   . Obesity Father   . Fibromyalgia Sister   . Asthma Mother   . Asthma Child     History  Substance Use Topics  . Smoking status: Current Every Day Smoker    Types: Cigarettes  . Smokeless tobacco: Never Used     Comment: started smoking in 20's.  Currenly smoking 1ppd  . Alcohol Use: No    OB History    Grav Para Term Preterm Abortions TAB SAB Ect Mult Living                  Review of Systems  Constitutional: Positive for fatigue. Negative for fever, chills and appetite change.  HENT: Positive for congestion, sore throat, rhinorrhea, postnasal drip, sinus pressure and ear discharge. Negative for mouth sores and neck stiffness.   Eyes: Negative for visual disturbance.  Respiratory: Positive for cough, chest tightness, shortness of breath and wheezing. Negative for stridor.   Cardiovascular: Negative for chest pain, palpitations and leg swelling.  Gastrointestinal: Negative for nausea, vomiting, abdominal pain and diarrhea.  Genitourinary: Negative for dysuria, urgency, frequency and hematuria.  Musculoskeletal: Negative for myalgias, back pain and arthralgias.  Skin: Negative for rash.  Neurological: Positive for headaches. Negative for syncope, light-headedness  and numbness.  Hematological: Negative for adenopathy.  Psychiatric/Behavioral: The patient is not nervous/anxious.   All other systems reviewed and are negative.   A complete 10 system review of systems was obtained and all systems are negative except as noted in the HPI and PMH.   Allergies  Shellfish allergy; Sulfamethoxazole; Vicodin; and Codeine  Home Medications   Current Outpatient Rx  Name  Route  Sig  Dispense  Refill  . ALBUTEROL SULFATE (2.5  MG/3ML) 0.083% IN NEBU   Nebulization   Take 2.5 mg by nebulization every 6 (six) hours as needed.         Marland Kitchen FLUTICASONE PROPIONATE 50 MCG/ACT NA SUSP   Nasal   Place 2 sprays into the nose 2 (two) times daily.         . IBUPROFEN 600 MG PO TABS   Oral   Take 600 mg by mouth every 6 (six) hours as needed. For pain          . LORAZEPAM 1 MG PO TABS   Oral   Take 0.5 mg by mouth every 4 (four) hours as needed. Takes 1/4 of tablet at a time            . VALACYCLOVIR HCL 1 G PO TABS   Oral   Take 500 mg by mouth daily as needed.          . VENTOLIN HFA 108 (90 BASE) MCG/ACT IN AERS      INHALE 2-4 PUFFS INTO THE LUNGS EVERY 4 (FOUR) HOURS AS NEEDED (WHEEZE).   1 Inhaler   0   . BENZONATATE 100 MG PO CAPS   Oral   Take 2 capsules (200 mg total) by mouth 2 (two) times daily as needed for cough.   20 capsule   0   . GUAIFENESIN ER 600 MG PO TB12   Oral   Take 2 tablets (1,200 mg total) by mouth 2 (two) times daily.   30 tablet   0   . IPRATROPIUM BROMIDE 0.03 % NA SOLN   Nasal   Place 2 sprays into the nose 2 (two) times daily. PRN congestion   30 mL   0     BP 120/56  Pulse 77  Temp 97.8 F (36.6 C) (Oral)  Resp 16  SpO2 95%  Physical Exam  Constitutional: She is oriented to person, place, and time. She appears well-developed and well-nourished. No distress.  HENT:  Head: Normocephalic and atraumatic.  Right Ear: Tympanic membrane, external ear and ear canal normal.  Left Ear: Tympanic membrane, external ear and ear canal normal.  Nose: Mucosal edema and rhinorrhea present. No epistaxis. Right sinus exhibits no maxillary sinus tenderness and no frontal sinus tenderness. Left sinus exhibits no maxillary sinus tenderness and no frontal sinus tenderness.  Mouth/Throat: Uvula is midline, oropharynx is clear and moist and mucous membranes are normal. Mucous membranes are not pale and not cyanotic. No oropharyngeal exudate, posterior oropharyngeal edema,  posterior oropharyngeal erythema or tonsillar abscesses.  Eyes: Conjunctivae normal are normal. Pupils are equal, round, and reactive to light.  Neck: Normal range of motion and full passive range of motion without pain.  Cardiovascular: Normal rate, normal heart sounds and intact distal pulses.  Exam reveals no gallop and no friction rub.   No murmur heard. Pulmonary/Chest: Breath sounds normal. No accessory muscle usage or stridor. Not tachypneic. No respiratory distress. She has no decreased breath sounds. She has no wheezes. She has no rhonchi. She  has no rales. She exhibits no tenderness.       Mild increased work of breathing without accessory muscle use.    Abdominal: Soft. Bowel sounds are normal. She exhibits no distension and no mass. There is no tenderness. There is no rebound and no guarding.  Musculoskeletal: Normal range of motion. She exhibits no edema and no tenderness.  Lymphadenopathy:    She has no cervical adenopathy.  Neurological: She is alert and oriented to person, place, and time. She exhibits normal muscle tone. Coordination normal.  Skin: Skin is warm and dry. No rash noted. She is not diaphoretic.  Psychiatric: She has a normal mood and affect.    ED Course  Procedures (including critical care time)  DIAGNOSTIC STUDIES: Oxygen Saturation is 95% on room air, Adequate by my interpretation.    COORDINATION OF CARE:  1540-Patient / Family / Caregiver informed of clinical course, understand medical decision-making process, and agree with plan.  Labs Reviewed - No data to display Dg Chest 2 View  12/14/2012  *RADIOLOGY REPORT*  Clinical Data: Cough and congestion.  Short of breath  CHEST - 2 VIEW  Comparison: 08/29/2012  Findings: Diffuse reticular lung markings are unchanged and compatible with chronic lung disease and scarring.  Mild atelectasis in the lung bases.  No definite pneumonia.  Negative for heart failure or effusion.  IMPRESSION: Chronic interstitial  scarring.  Mild bibasilar atelectasis.  No definite pneumonia.   Original Report Authenticated By: Janeece Riggers, M.D.      1. Viral URI with cough   2. Shortness of breath   3. FIBROMYALGIA   4. TOBACCO DEPENDENCE       MDM  Sheri Rivera presents with URI symptoms.  Pt CXR negative for acute infiltrate, but does show mild bibasilar atelectasis without definite pneumonia.. Patients symptoms are consistent with URI, likely viral etiology. Patient is followed by [facility] pulmonology for chronic shortness of breath and has a 20 year smoking history. Discussed that antibiotics are usually not indicated for viral infections, however due to questionable x-ray will give azithromycin for possible early community-acquired pneumonia. Pt will be discharged with symptomatic treatment.  Verbalizes understanding and is agreeable with plan. Pt is hemodynamically stable & in NAD prior to dc.  Patient not tachycardic, no chest pain; no concern for PE at this time. Also discussed the patient should followup with her primary care physician and/or pulmonology for further evaluation of her shortness of breath. She is to return here to the emergency department if she states his palpitations or increasing shortness of breath.  I have also discussed other reasons to return immediately to the ER.  Patient expresses understanding and agrees with plan.   1. Medications: atrovent NS, mucinex, hycodan, azithromycin, usual home medications 2. Treatment: rest, drink plenty of fluids, take medications as prescibed 3. Follow Up: Please followup with your primary doctor for discussion of your diagnoses and further evaluation after today's visit; if you do not have a primary care doctor use the resource guide provided to find one  I personally performed the services described in this documentation, which was scribed in my presence. The recorded information has been reviewed and is accurate.   Dahlia Client Jerrika Ledlow,  PA-C 12/14/12 1734

## 2012-12-15 NOTE — ED Provider Notes (Signed)
Medical screening examination/treatment/procedure(s) were performed by non-physician practitioner and as supervising physician I was immediately available for consultation/collaboration.  Maida Widger J. Quandra Fedorchak, MD 12/15/12 0021 

## 2013-01-02 ENCOUNTER — Emergency Department (HOSPITAL_COMMUNITY)
Admission: EM | Admit: 2013-01-02 | Discharge: 2013-01-02 | Disposition: A | Discharge status: Home / Self Care | Payer: Medicaid Other | Attending: Emergency Medicine | Admitting: Emergency Medicine

## 2013-01-02 ENCOUNTER — Emergency Department (HOSPITAL_COMMUNITY): Payer: Medicaid Other

## 2013-01-02 DIAGNOSIS — R51 Headache: Secondary | ICD-10-CM | POA: Insufficient documentation

## 2013-01-02 DIAGNOSIS — Z862 Personal history of diseases of the blood and blood-forming organs and certain disorders involving the immune mechanism: Secondary | ICD-10-CM | POA: Insufficient documentation

## 2013-01-02 DIAGNOSIS — Z8639 Personal history of other endocrine, nutritional and metabolic disease: Secondary | ICD-10-CM | POA: Insufficient documentation

## 2013-01-02 DIAGNOSIS — IMO0001 Reserved for inherently not codable concepts without codable children: Secondary | ICD-10-CM | POA: Insufficient documentation

## 2013-01-02 DIAGNOSIS — F411 Generalized anxiety disorder: Secondary | ICD-10-CM | POA: Insufficient documentation

## 2013-01-02 DIAGNOSIS — R197 Diarrhea, unspecified: Secondary | ICD-10-CM | POA: Insufficient documentation

## 2013-01-02 DIAGNOSIS — R109 Unspecified abdominal pain: Secondary | ICD-10-CM

## 2013-01-02 DIAGNOSIS — J45909 Unspecified asthma, uncomplicated: Secondary | ICD-10-CM | POA: Insufficient documentation

## 2013-01-02 DIAGNOSIS — Z8739 Personal history of other diseases of the musculoskeletal system and connective tissue: Secondary | ICD-10-CM | POA: Insufficient documentation

## 2013-01-02 DIAGNOSIS — B009 Herpesviral infection, unspecified: Secondary | ICD-10-CM | POA: Insufficient documentation

## 2013-01-02 DIAGNOSIS — Z8659 Personal history of other mental and behavioral disorders: Secondary | ICD-10-CM | POA: Insufficient documentation

## 2013-01-02 DIAGNOSIS — Z8719 Personal history of other diseases of the digestive system: Secondary | ICD-10-CM | POA: Insufficient documentation

## 2013-01-02 DIAGNOSIS — R1084 Generalized abdominal pain: Secondary | ICD-10-CM | POA: Insufficient documentation

## 2013-01-02 DIAGNOSIS — F17200 Nicotine dependence, unspecified, uncomplicated: Secondary | ICD-10-CM | POA: Insufficient documentation

## 2013-01-02 DIAGNOSIS — Z792 Long term (current) use of antibiotics: Secondary | ICD-10-CM | POA: Insufficient documentation

## 2013-01-02 DIAGNOSIS — R110 Nausea: Secondary | ICD-10-CM | POA: Insufficient documentation

## 2013-01-02 DIAGNOSIS — Z79899 Other long term (current) drug therapy: Secondary | ICD-10-CM | POA: Insufficient documentation

## 2013-01-02 LAB — LIPASE, BLOOD: Lipase: 24 U/L (ref 11–59)

## 2013-01-02 LAB — CBC WITH DIFFERENTIAL/PLATELET
Basophils Absolute: 0.1 10*3/uL (ref 0.0–0.1)
Basophils Relative: 1 % (ref 0–1)
Eosinophils Absolute: 0.1 10*3/uL (ref 0.0–0.7)
Eosinophils Relative: 1 % (ref 0–5)
HCT: 48.6 % — ABNORMAL HIGH (ref 36.0–46.0)
Hemoglobin: 16.7 g/dL — ABNORMAL HIGH (ref 12.0–15.0)
Lymphocytes Relative: 27 % (ref 12–46)
Lymphs Abs: 3 10*3/uL (ref 0.7–4.0)
MCH: 32 pg (ref 26.0–34.0)
MCHC: 34.4 g/dL (ref 30.0–36.0)
MCV: 93.1 fL (ref 78.0–100.0)
Monocytes Absolute: 0.7 10*3/uL (ref 0.1–1.0)
Monocytes Relative: 6 % (ref 3–12)
Neutro Abs: 7.3 10*3/uL (ref 1.7–7.7)
Neutrophils Relative %: 66 % (ref 43–77)
Platelets: 262 10*3/uL (ref 150–400)
RBC: 5.22 MIL/uL — ABNORMAL HIGH (ref 3.87–5.11)
RDW: 13.3 % (ref 11.5–15.5)
WBC: 11.1 10*3/uL — ABNORMAL HIGH (ref 4.0–10.5)

## 2013-01-02 LAB — BASIC METABOLIC PANEL
BUN: 3 mg/dL — ABNORMAL LOW (ref 6–23)
CO2: 22 mEq/L (ref 19–32)
Calcium: 9.6 mg/dL (ref 8.4–10.5)
Chloride: 103 mEq/L (ref 96–112)
Creatinine, Ser: 0.5 mg/dL (ref 0.50–1.10)
GFR calc Af Amer: >90 mL/min (ref >90)
GFR calc non Af Amer: >90 mL/min (ref >90)
Glucose, Bld: 96 mg/dL (ref 70–99)
Potassium: 3.8 mEq/L (ref 3.5–5.1)
Sodium: 140 mEq/L (ref 135–145)

## 2013-01-02 LAB — HEPATIC FUNCTION PANEL
ALT: 175 U/L — ABNORMAL HIGH (ref 0–35)
AST: 52 U/L — ABNORMAL HIGH (ref 0–37)
Albumin: 3.2 g/dL — ABNORMAL LOW (ref 3.5–5.2)
Alkaline Phosphatase: 127 U/L — ABNORMAL HIGH (ref 39–117)
Bilirubin, Direct: <0.1 mg/dL (ref 0.0–0.3)
Total Bilirubin: 0.3 mg/dL (ref 0.3–1.2)
Total Protein: 6.3 g/dL (ref 6.0–8.3)

## 2013-01-02 LAB — MAGNESIUM: Magnesium: 2.1 mg/dL (ref 1.5–2.5)

## 2013-01-02 LAB — TROPONIN I: Troponin I: <0.3 ng/mL (ref <0.30)

## 2013-01-02 MED ORDER — LANSOPRAZOLE 30 MG PO CPDR: 30.0000 mg | DELAYED_RELEASE_CAPSULE | Freq: Every day | ORAL | Status: DC

## 2013-01-02 MED ORDER — SODIUM CHLORIDE 0.9 % IV BOLUS (SEPSIS)
1000.0000 mL | Freq: Once | INTRAVENOUS | Status: AC
  Administered 2013-01-02: 1000 mL via INTRAVENOUS

## 2013-01-02 MED ORDER — OXYCODONE-ACETAMINOPHEN 5-325 MG PO TABS: 1.0000 | ORAL_TABLET | Freq: Four times a day (QID) | ORAL | Status: DC | PRN

## 2013-01-02 MED ORDER — MECLIZINE HCL 50 MG PO TABS: 50.0000 mg | ORAL_TABLET | Freq: Three times a day (TID) | ORAL | Status: DC | PRN

## 2013-01-02 MED ORDER — MECLIZINE HCL 25 MG PO TABS
25.0000 mg | ORAL_TABLET | Freq: Once | ORAL | Status: AC
  Administered 2013-01-02: 25 mg via ORAL
  Filled 2013-01-02: qty 1

## 2013-01-02 MED ORDER — MORPHINE SULFATE 4 MG/ML IJ SOLN
4.0000 mg | Freq: Once | INTRAMUSCULAR | Status: DC
  Filled 2013-01-02: qty 1

## 2013-01-02 MED ORDER — ONDANSETRON 8 MG PO TBDP: 8.0000 mg | ORAL_TABLET | Freq: Three times a day (TID) | ORAL | Status: DC | PRN

## 2013-01-02 MED ORDER — MORPHINE SULFATE 4 MG/ML IJ SOLN
4.0000 mg | Freq: Once | INTRAMUSCULAR | Status: AC
  Administered 2013-01-02: 4 mg via INTRAVENOUS
  Filled 2013-01-02: qty 1

## 2013-01-02 MED ORDER — SODIUM CHLORIDE 0.9 % IV SOLN
Freq: Once | INTRAVENOUS | Status: AC
  Administered 2013-01-02: 16:00:00 via INTRAVENOUS

## 2013-01-02 NOTE — ED Notes (Signed)
Pt c/o N/V, diarrhea, abd pain x 5 days

## 2013-01-02 NOTE — ED Notes (Signed)
Pt states that for a few days now she has been have loose stools and ear aches, dizzyness, flu like sx,

## 2013-01-02 NOTE — ED Notes (Signed)
Pt to MRI on stretcher.

## 2013-01-02 NOTE — ED Provider Notes (Signed)
History     CSN: [id number]  Arrival date & time 01/02/13  1314   First MD Initiated Contact with Patient 01/02/13 1333      No chief complaint on file.   (Consider location/radiation/quality/duration/timing/severity/associated sxs/prior treatment) HPI Comments: Pt comes in with cc of dizziness. Pt has been having some watery, diarrhea like BM for the past few days. She mentioned it to her PCP, and was asked to take gatorade and keep up with her losses. Now she has been having some dizziness, and spinning sensation, and she is not comfortable moving around due to her dizziness. Dizziness is described as vertigo - which is worse with certain positions. Pt has nausea as well, no neck pain, stiffness, no fevers, chlls. No  Hx of strokes. Mild diffuse abd pain. Diarrhea - non mucusy, non bloody.  The history is provided by the patient and medical records.    Past Medical History  Diagnosis Date  . GERD (gastroesophageal reflux disease)   . Thyroid nodule   . Anxiety 11/11/11  . Asthma   . Herpes   . Seasonal allergies   . Arthritis   . Depression   . Fibromyalgia   . IBS (irritable bowel syndrome)   . DDD (degenerative disc disease)   . Bursitis     right shoulder    Past Surgical History  Procedure Date  . Hernia repair   . Cholecystectomy   . Tubal ligation 1983    Family History  Problem Relation Age of Onset  . Lung cancer Mother   . Arthritis Mother     Rheumatoid  . Bursitis Mother   . Heart attack Father   . Obesity Father   . Fibromyalgia Sister   . Asthma Mother   . Asthma Child     History  Substance Use Topics  . Smoking status: Current Every Day Smoker    Types: Cigarettes  . Smokeless tobacco: Never Used     Comment: started smoking in 20's.  Currenly smoking 1ppd  . Alcohol Use: No    OB History    Grav Para Term Preterm Abortions TAB SAB Ect Mult Living                  Review of Systems  Constitutional: Negative for activity change.   HENT: Negative for facial swelling and neck pain.   Respiratory: Negative for cough, shortness of breath and wheezing.   Cardiovascular: Negative for chest pain.  Gastrointestinal: Positive for nausea, abdominal pain and diarrhea. Negative for vomiting, constipation, blood in stool and abdominal distention.  Genitourinary: Negative for hematuria and difficulty urinating.  Skin: Negative for color change.  Neurological: Positive for dizziness and headaches. Negative for syncope and speech difficulty.  Hematological: Does not bruise/bleed easily.  Psychiatric/Behavioral: Negative for confusion.    Allergies  Shellfish allergy; Sulfamethoxazole; Vicodin; and Codeine  Home Medications   Current Outpatient Rx  Name  Route  Sig  Dispense  Refill  . ALBUTEROL SULFATE HFA 108 (90 BASE) MCG/ACT IN AERS   Inhalation   Inhale 2 puffs into the lungs every 6 (six) hours as needed. Shortness of breath         . ALBUTEROL SULFATE (2.5 MG/3ML) 0.083% IN NEBU   Nebulization   Take 2.5 mg by nebulization every 6 (six) hours as needed. Shortness of breath         . AMOXICILLIN 875 MG PO TABS   Oral   Take 875 mg by mouth 2 (  two) times daily.         Marland Kitchen VITAMIN C 1000 MG PO TABS   Oral   Take 1,000 mg by mouth daily.         . CYCLOBENZAPRINE HCL 10 MG PO TABS   Oral   Take 5 mg by mouth 3 (three) times daily as needed. Muscle spasms         . FLUTICASONE PROPIONATE 50 MCG/ACT NA SUSP   Nasal   Place 2 sprays into the nose 2 (two) times daily.         . IBUPROFEN 600 MG PO TABS   Oral   Take 600 mg by mouth every 6 (six) hours as needed. For pain          . LORAZEPAM 1 MG PO TABS   Oral   Take 0.5 mg by mouth every 4 (four) hours as needed. Takes 1/4 of tablet at a time            . VALACYCLOVIR HCL 1 G PO TABS   Oral   Take 500 mg by mouth daily as needed. outbreaks         . MECLIZINE HCL 50 MG PO TABS   Oral   Take 1 tablet (50 mg total) by mouth 3 (three)  times daily as needed.   30 tablet   0     BP 114/76  Pulse 88  Temp 98.8 F (37.1 C)  Resp 24  SpO2 100%  Physical Exam  Nursing note and vitals reviewed. Constitutional: She is oriented to person, place, and time. She appears well-developed.  HENT:  Head: Normocephalic and atraumatic.  Eyes: Conjunctivae and EOM are normal. Pupils are equal, round, and reactive to light.  Neck: Normal range of motion. Neck supple.  Cardiovascular: Normal rate, regular rhythm and normal heart sounds.   Pulmonary/Chest: Effort normal and breath sounds normal. No respiratory distress.  Abdominal: Soft. Bowel sounds are normal. She exhibits no distension. There is no tenderness. There is no rebound and no guarding.  Neurological: She is alert and oriented to person, place, and time. No cranial nerve deficit. Coordination normal.  Cerebellar exam is normal (finger to nose) Sensory exam normal for bilateral upper and lower extremities - and patient is able to discriminate between sharp and dull. Motor exam is 4+/5  Pt's vertigo is worse when she was laid flat.  Skin: Skin is warm and dry.    ED Course  Procedures (including critical care time)  Labs Reviewed  BASIC METABOLIC PANEL - Abnormal; Notable for the following:    BUN 3 (*)     All other components within normal limits  CBC WITH DIFFERENTIAL - Abnormal; Notable for the following:    WBC 11.1 (*)     RBC 5.22 (*)     Hemoglobin 16.7 (*)     HCT 48.6 (*)     All other components within normal limits  MAGNESIUM  TROPONIN I   Dg Chest Port 1 View  01/02/2013  *RADIOLOGY REPORT*  Clinical Data: Flu-like symptoms and history of asthma and tobacco use.  PORTABLE CHEST - 1 VIEW  Comparison: 12/14/2012  Findings: Stable COPD/chronic interstitial lung disease.  Bibasilar scarring and atelectasis present.  No focal consolidation, edema, pleural fluid or pneumothorax.  Heart size is normal.  IMPRESSION: Stable COPD and chronic interstitial  lung disease.   Original Report Authenticated By: Irish Lack, M.D.      1. Vertigo   2.  Orthostatic hypotension       MDM   Date: 01/02/2013  Rate: 89  Rhythm: normal sinus rhythm  QRS Axis: normal  Intervals: normal  ST/T Wave abnormalities: normal  Conduction Disutrbances: none  Narrative Interpretation: unremarkable    DDx includes: Central vertigo:  Tumor  Stroke  ICH  Vertebrobasilar TIA  Peripheral Vertigo:  BPPV  Vestibular neuritis  Meniere disease  Migrainous vertigo  Ear Infection   Pt comes in with cc of diarrhea and vertigo. The vertigo is very frequent, long lasting, with mild nausea and the vertigo is not severe. It has character of both, central and peripheral process.  With her diarrhea - plan was to hydrate her and reassess. On reassessment - pt had persistent vertigo - so MRI was ordered to ensure there was no central process. Meclizine was also provided. Diarrhea - non bloody, no mucus, no fevers, and onset was 3-4 days ago - so no further recs in addition to what aleady advised by pcp.  Derwood Kaplan, MD 01/03/13 [phone]

## 2013-01-02 NOTE — ED Provider Notes (Addendum)
Pt signed out to me to follow up on her MRI.  Pt state she came here because of her abdominal pain.  She has been having chronic diarrhea since Monday.  She has had pain in her abdomen, mostly in her upper abdomen.  She called her doctor and they changed her medication but it has not helped.  No vomiting.  Some nausea.  The patient's laboratory tests showed just mild increase in her liver function tests.  Her bilirubin is not elevated. Patient does have common bile duct dilatation this most likely is related to her prior cholecystectomy. CT scan does not show any signs of obstruction, diverticulitis or other acute surgical abnormality. Considering the slight increase in LFTs I do feel it is reasonable to follow up with her GI doctor. Discuss possible ERCP. Discussed with patient the importance of monitoring for fever worsening symptoms. I will give her prescription for pain medications.  Celene Kras, MD 01/02/13 2059 Discussed findings with Dr Madilyn Fireman.  CBD is slightly more than usual for s/p cholecystectomy.  Overall doubt CBD stone.  Will have pt follow up in the office.  Return to ED for worsening symptoms.   Start PPI.  May end up needing MRCP.    Celene Kras, MD 01/02/13 [phone]

## 2013-01-02 NOTE — ED Notes (Signed)
Pt states" she don't need to be hooked up to anything or have EKG done because she is here for the flu." Nurse  Shawna Orleans and Nurse tech Chrissie Noa aware

## 2013-01-08 ENCOUNTER — Ambulatory Visit: Payer: Medicaid Other | Admitting: Internal Medicine

## 2013-01-29 ENCOUNTER — Encounter (HOSPITAL_COMMUNITY): Payer: Self-pay | Admitting: Emergency Medicine

## 2013-01-29 ENCOUNTER — Emergency Department (HOSPITAL_COMMUNITY)
Admission: EM | Admit: 2013-01-29 | Discharge: 2013-01-29 | Disposition: A | Discharge status: Home / Self Care | Payer: Medicaid Other | Attending: Emergency Medicine | Admitting: Emergency Medicine

## 2013-01-29 ENCOUNTER — Emergency Department (HOSPITAL_COMMUNITY): Payer: Medicaid Other

## 2013-01-29 DIAGNOSIS — IMO0002 Reserved for concepts with insufficient information to code with codable children: Secondary | ICD-10-CM | POA: Insufficient documentation

## 2013-01-29 DIAGNOSIS — Z79899 Other long term (current) drug therapy: Secondary | ICD-10-CM | POA: Insufficient documentation

## 2013-01-29 DIAGNOSIS — K219 Gastro-esophageal reflux disease without esophagitis: Secondary | ICD-10-CM | POA: Insufficient documentation

## 2013-01-29 DIAGNOSIS — R079 Chest pain, unspecified: Secondary | ICD-10-CM

## 2013-01-29 DIAGNOSIS — F17200 Nicotine dependence, unspecified, uncomplicated: Secondary | ICD-10-CM | POA: Insufficient documentation

## 2013-01-29 DIAGNOSIS — Z8659 Personal history of other mental and behavioral disorders: Secondary | ICD-10-CM | POA: Insufficient documentation

## 2013-01-29 DIAGNOSIS — Z8619 Personal history of other infectious and parasitic diseases: Secondary | ICD-10-CM | POA: Insufficient documentation

## 2013-01-29 DIAGNOSIS — R0602 Shortness of breath: Secondary | ICD-10-CM | POA: Insufficient documentation

## 2013-01-29 DIAGNOSIS — R071 Chest pain on breathing: Secondary | ICD-10-CM | POA: Insufficient documentation

## 2013-01-29 DIAGNOSIS — E079 Disorder of thyroid, unspecified: Secondary | ICD-10-CM | POA: Insufficient documentation

## 2013-01-29 DIAGNOSIS — K589 Irritable bowel syndrome without diarrhea: Secondary | ICD-10-CM | POA: Insufficient documentation

## 2013-01-29 DIAGNOSIS — Z8739 Personal history of other diseases of the musculoskeletal system and connective tissue: Secondary | ICD-10-CM | POA: Insufficient documentation

## 2013-01-29 DIAGNOSIS — J45909 Unspecified asthma, uncomplicated: Secondary | ICD-10-CM | POA: Insufficient documentation

## 2013-01-29 LAB — CBC WITH DIFFERENTIAL/PLATELET
Basophils Absolute: 0 10*3/uL (ref 0.0–0.1)
Basophils Relative: 0 % (ref 0–1)
Eosinophils Absolute: 0.1 10*3/uL (ref 0.0–0.7)
Eosinophils Relative: 0 % (ref 0–5)
HCT: 46.2 % — ABNORMAL HIGH (ref 36.0–46.0)
Hemoglobin: 16.1 g/dL — ABNORMAL HIGH (ref 12.0–15.0)
Lymphocytes Relative: 16 % (ref 12–46)
Lymphs Abs: 2.3 10*3/uL (ref 0.7–4.0)
MCH: 31.2 pg (ref 26.0–34.0)
MCHC: 34.8 g/dL (ref 30.0–36.0)
MCV: 89.5 fL (ref 78.0–100.0)
Monocytes Absolute: 1 10*3/uL (ref 0.1–1.0)
Monocytes Relative: 7 % (ref 3–12)
Neutro Abs: 11 10*3/uL — ABNORMAL HIGH (ref 1.7–7.7)
Neutrophils Relative %: 77 % (ref 43–77)
Platelets: 255 10*3/uL (ref 150–400)
RBC: 5.16 MIL/uL — ABNORMAL HIGH (ref 3.87–5.11)
RDW: 13.3 % (ref 11.5–15.5)
WBC: 14.3 10*3/uL — ABNORMAL HIGH (ref 4.0–10.5)

## 2013-01-29 LAB — URINALYSIS, ROUTINE W REFLEX MICROSCOPIC
Bilirubin Urine: NEGATIVE
Glucose, UA: NEGATIVE mg/dL
Hgb urine dipstick: NEGATIVE
Ketones, ur: NEGATIVE mg/dL
Leukocytes, UA: NEGATIVE
Nitrite: NEGATIVE
Protein, ur: NEGATIVE mg/dL
Specific Gravity, Urine: 1.003 — ABNORMAL LOW (ref 1.005–1.030)
Urobilinogen, UA: 0.2 mg/dL (ref 0.0–1.0)
pH: 7.5 (ref 5.0–8.0)

## 2013-01-29 LAB — COMPREHENSIVE METABOLIC PANEL
ALT: 12 U/L (ref 0–35)
AST: 16 U/L (ref 0–37)
Albumin: 3.7 g/dL (ref 3.5–5.2)
Alkaline Phosphatase: 93 U/L (ref 39–117)
BUN: 8 mg/dL (ref 6–23)
CO2: 22 mEq/L (ref 19–32)
Calcium: 10.1 mg/dL (ref 8.4–10.5)
Chloride: 105 mEq/L (ref 96–112)
Creatinine, Ser: 0.48 mg/dL — ABNORMAL LOW (ref 0.50–1.10)
GFR calc Af Amer: >90 mL/min (ref >90)
GFR calc non Af Amer: >90 mL/min (ref >90)
Glucose, Bld: 95 mg/dL (ref 70–99)
Potassium: 3.9 mEq/L (ref 3.5–5.1)
Sodium: 140 mEq/L (ref 135–145)
Total Bilirubin: 0.4 mg/dL (ref 0.3–1.2)
Total Protein: 7 g/dL (ref 6.0–8.3)

## 2013-01-29 LAB — POCT I-STAT TROPONIN I: Troponin i, poc: 0 ng/mL (ref 0.00–0.08)

## 2013-01-29 LAB — D-DIMER, QUANTITATIVE (NOT AT ARMC): D-Dimer, Quant: <0.27 ug/mL-FEU (ref 0.00–0.48)

## 2013-01-29 LAB — LIPASE, BLOOD: Lipase: 50 U/L (ref 11–59)

## 2013-01-29 MED ORDER — IBUPROFEN 800 MG PO TABS
800.0000 mg | ORAL_TABLET | Freq: Once | ORAL | Status: AC
  Administered 2013-01-29: 800 mg via ORAL
  Filled 2013-01-29: qty 1

## 2013-01-29 MED ORDER — SODIUM CHLORIDE 0.9 % IV BOLUS (SEPSIS)
1000.0000 mL | Freq: Once | INTRAVENOUS | Status: AC
  Administered 2013-01-29: 1000 mL via INTRAVENOUS

## 2013-01-29 MED ORDER — GI COCKTAIL ~~LOC~~
30.0000 mL | Freq: Once | ORAL | Status: AC
  Administered 2013-01-29: 30 mL via ORAL
  Filled 2013-01-29: qty 30

## 2013-01-29 NOTE — ED Notes (Signed)
Pt from home via ems, c/o chest heaviness and tightness with sob, no radiation, n/v. Pt had 324 asa and 1 nitro with no relief

## 2013-01-29 NOTE — ED Notes (Signed)
Patient transported to X-ray 

## 2013-01-29 NOTE — ED Provider Notes (Signed)
52 year old female with a history of significant tobacco use in the past, irritable bowel syndrome and anxiety with some acid reflux who presents with a complaint of chest pain and difficulty breathing. She states that she awoke this morning and was having significantly trouble breathing with heaviness on her chest located in the sternal area. Nothing seems to make this better or worse, she questions whether this is related to her distended abdomen which occasionally happens after she is or drinks water. She is not acid reflux, has been on proton pump inhibitors in the past but has had minimal improvement with this. She has had a heart catheterization approximately 10 years ago which she reports as negative for any obstructive disease and a admission in 2010 at which time she was supposed to have a stress test but but never ended up getting a stress test.  On my exam the patient has a soft nontender abdomen with some minimal distention in the epigastrium. She has clear heart and lung sounds, no murmurs rubs or gallops, no rales or wheezing. No peripheral edema in her legs and no JVD.  On EKG there is no findings for ischemia, and is a normal appearing EKG.  Labs and chest x-ray pending, the patient would benefit from admission to the hospital for cardiac rule out and a stress test. This could also be a gastrointestinal etiology.   Medical screening examination/treatment/procedure(s) were conducted as a shared visit with non-physician practitioner(s) and myself.  I personally evaluated the patient during the encounter    Vida Roller, MD 01/29/13 1134

## 2013-01-29 NOTE — ED Provider Notes (Signed)
History     CSN: [id number]  Arrival date & time 01/29/13  1044   First MD Initiated Contact with Patient 01/29/13 1046      Chief Complaint  Patient presents with  . Chest Pain  . Shortness of Breath    (Consider location/radiation/quality/duration/timing/severity/associated sxs/prior treatment) HPI Comments: Pt presents to the ED via EMS  for chest pain and SOB which began earlier this morning.  States when she woke up she felt that something was "not right" in her chest.  Pain is exacerbated by deep breathing and coughing.  There was a sense of heaviness and she was somewhat SOB.  Tried taking gas-x, ativan, and neb tx at home without relief- states these usually help her.  Given nitro and ASA by EMS en route to ED without relief.  No recent LE edema, although she has noted some increased pain with ambulation and a sense of heaviness in her legs.  Hx of asthma and anxiety, notes that she smoked a lot of cigarettes yesterday trying to calm her nerves.  Recently taken off of her Protonix and switched to prevacid due to insurance.  States this is not controlling her GERD sx.  Denies any nausea, vomiting, diarrhea, dysuria, fever, or chills.  Patient is a 52 y.o. female presenting with chest pain and shortness of breath. The history is provided by the patient.  Chest Pain Associated symptoms: shortness of breath   Shortness of Breath Associated symptoms: chest pain     Past Medical History  Diagnosis Date  . GERD (gastroesophageal reflux disease)   . Thyroid nodule   . Anxiety 11/11/11  . Asthma   . Herpes   . Seasonal allergies   . Arthritis   . Depression   . Fibromyalgia   . IBS (irritable bowel syndrome)   . DDD (degenerative disc disease)   . Bursitis     right shoulder    Past Surgical History  Procedure Laterality Date  . Hernia repair    . Cholecystectomy    . Tubal ligation  1983    Family History  Problem Relation Age of Onset  . Lung cancer Mother   .  Arthritis Mother     Rheumatoid  . Bursitis Mother   . Heart attack Father   . Obesity Father   . Fibromyalgia Sister   . Asthma Mother   . Asthma Child     History  Substance Use Topics  . Smoking status: Current Every Day Smoker    Types: Cigarettes  . Smokeless tobacco: Never Used     Comment: started smoking in 20's.  Currenly smoking 1ppd  . Alcohol Use: No    OB History   Grav Para Term Preterm Abortions TAB SAB Ect Mult Living                  Review of Systems  Respiratory: Positive for shortness of breath.   Cardiovascular: Positive for chest pain.  All other systems reviewed and are negative.    Allergies  Shellfish allergy; Sulfamethoxazole; Vicodin; and Codeine  Home Medications   Current Outpatient Rx  Name  Route  Sig  Dispense  Refill  . albuterol (PROVENTIL HFA;VENTOLIN HFA) 108 (90 BASE) MCG/ACT inhaler   Inhalation   Inhale 2 puffs into the lungs every 6 (six) hours as needed. Shortness of breath         . albuterol (PROVENTIL) (2.5 MG/3ML) 0.083% nebulizer solution   Nebulization   Take 2.5 mg  by nebulization every 6 (six) hours as needed. Shortness of breath         . amoxicillin (AMOXIL) 875 MG tablet   Oral   Take 875 mg by mouth 2 (two) times daily.         . Ascorbic Acid (VITAMIN C) 1000 MG tablet   Oral   Take 1,000 mg by mouth daily.         . cyclobenzaprine (FLEXERIL) 10 MG tablet   Oral   Take 5 mg by mouth 3 (three) times daily as needed. Muscle spasms         . fluticasone (FLONASE) 50 MCG/ACT nasal spray   Nasal   Place 2 sprays into the nose 2 (two) times daily.         Marland Kitchen ibuprofen (ADVIL,MOTRIN) 600 MG tablet   Oral   Take 600 mg by mouth every 6 (six) hours as needed. For pain          . lansoprazole (PREVACID) 30 MG capsule   Oral   Take 1 capsule (30 mg total) by mouth daily.   14 capsule   0   . LORazepam (ATIVAN) 1 MG tablet   Oral   Take 0.5 mg by mouth every 4 (four) hours as needed.  Takes 1/4 of tablet at a time            . meclizine (ANTIVERT) 50 MG tablet   Oral   Take 1 tablet (50 mg total) by mouth 3 (three) times daily as needed.   30 tablet   0   . ondansetron (ZOFRAN ODT) 8 MG disintegrating tablet   Oral   Take 1 tablet (8 mg total) by mouth every 8 (eight) hours as needed for nausea.   20 tablet   0   . oxyCODONE-acetaminophen (PERCOCET/ROXICET) 5-325 MG per tablet   Oral   Take 1-2 tablets by mouth every 6 (six) hours as needed for pain.   16 tablet   0   . valACYclovir (VALTREX) 1000 MG tablet   Oral   Take 500 mg by mouth daily as needed. outbreaks           BP 106/73  Pulse 81  Temp(Src) 97.3 F (36.3 C) (Oral)  Resp 26  SpO2 97%  Physical Exam  Nursing note and vitals reviewed. Constitutional: She is oriented to person, place, and time. She appears well-developed and well-nourished.  HENT:  Head: Normocephalic and atraumatic.  Mouth/Throat: Oropharynx is clear and moist.  Eyes: Conjunctivae and EOM are normal. Pupils are equal, round, and reactive to light.  Neck: Normal range of motion. Neck supple.  Cardiovascular: Normal rate, regular rhythm and normal heart sounds.   Pulmonary/Chest: Effort normal and breath sounds normal. No respiratory distress. She has no wheezes.  Abdominal: Soft. Bowel sounds are normal. There is tenderness in the epigastric area. There is CVA tenderness (R > L). There is no guarding.  Musculoskeletal: Normal range of motion. She exhibits no edema.  Lymphadenopathy:    She has no cervical adenopathy.  Neurological: She is alert and oriented to person, place, and time.  Skin: Skin is warm and dry.  Psychiatric: She has a normal mood and affect.    ED Course  Procedures (including critical care time)   Date: 01/29/2013  Rate: 85  Rhythm: normal sinus rhythm  QRS Axis: normal  Intervals: normal  ST/T Wave abnormalities: normal  Conduction Disutrbances:none  Narrative Interpretation:   Old  EKG Reviewed: unchanged  Labs Reviewed  CBC WITH DIFFERENTIAL - Abnormal; Notable for the following:    WBC 14.3 (*)    RBC 5.16 (*)    Hemoglobin 16.1 (*)    HCT 46.2 (*)    Neutro Abs 11.0 (*)    All other components within normal limits  COMPREHENSIVE METABOLIC PANEL - Abnormal; Notable for the following:    Creatinine, Ser 0.48 (*)    All other components within normal limits  URINALYSIS, ROUTINE W REFLEX MICROSCOPIC - Abnormal; Notable for the following:    Specific Gravity, Urine 1.003 (*)    All other components within normal limits  D-DIMER, QUANTITATIVE  LIPASE, BLOOD  POCT I-STAT TROPONIN I   Dg Chest 2 View  01/29/2013  *RADIOLOGY REPORT*  Clinical Data: Chest pain, shortness of breath  CHEST - 2 VIEW  Comparison: 01/02/2013  Findings: Cardiomediastinal silhouette is stable.  Again noted hyperinflation and chronic interstitial prominence.  No acute infiltrate or pleural effusion.  No pulmonary edema.  IMPRESSION: No active disease.  Stable COPD.   Original Report Authenticated By: Natasha Mead, M.D.      1. Shortness of breath   2. Chest pain       MDM    52 y.o. Female presents to the ED for CP and SOB.  Multiple medical problems including GERD, Asthma, anxiety, and tobacco substance use.  Cardiac cath approx 10 years ago without noted blockages.  Sx could largely be GI induced but with no recent cardiac stress testing or echo, would be wise to admit for observation and chest pain r/o.  Discussed this plan with pt and she is refusing to stay- aware that she will be leaving AMA and papers signed.  Given follow-up with [facility] cardiology for further testing.  Return precaution discussed.       Garlon Hatchet, PA-C 01/29/13 1307

## 2013-01-30 NOTE — ED Provider Notes (Signed)
Pt was informed of her results, and her RF for ACS, there is still some lingering concern for underlying ACS given the pt's hx of sig Tob abuse - she now has neg trop and neg ECG but needs further testing, she has been informed of need for this and our recommendation to be admitted for r/o and testing such as stress - she has refused and signed AMA.  Pt informed to return should her symptoms worsen or if she should change her mind.  Medical screening examination/treatment/procedure(s) were conducted as a shared visit with non-physician practitioner(s) and myself.  I personally evaluated the patient during the encounter  Please see my separate respective documentation pertaining to this patient encounter   Vida Roller, MD 01/30/13 [phone]

## 2013-02-06 ENCOUNTER — Other Ambulatory Visit: Payer: Self-pay | Admitting: Family Medicine

## 2013-02-06 DIAGNOSIS — M25512 Pain in left shoulder: Secondary | ICD-10-CM

## 2013-02-16 ENCOUNTER — Ambulatory Visit: Payer: Medicaid Other | Admitting: Internal Medicine

## 2013-02-16 ENCOUNTER — Encounter: Payer: Self-pay | Admitting: Gastroenterology

## 2013-02-16 ENCOUNTER — Encounter: Payer: Self-pay | Admitting: Cardiology

## 2013-02-16 ENCOUNTER — Ambulatory Visit (INDEPENDENT_AMBULATORY_CARE_PROVIDER_SITE_OTHER): Payer: Medicaid Other | Admitting: Cardiology

## 2013-02-16 VITALS — BP 116/72 | HR 87 | Ht 63.0 in | Wt 148.4 lb

## 2013-02-16 DIAGNOSIS — R072 Precordial pain: Secondary | ICD-10-CM

## 2013-02-16 DIAGNOSIS — R0602 Shortness of breath: Secondary | ICD-10-CM

## 2013-02-16 NOTE — Patient Instructions (Addendum)
The current medical regimen is effective;  continue present plan and medications.  Your physician has requested that you have an exercise tolerance test. For further information please visit [email or web address]. Please also follow instruction sheet, as given.   

## 2013-02-16 NOTE — Progress Notes (Signed)
HPI The patient presents for evaluation of chest discomfort. The emergency room earlier this month I have reviewed these records. It was suggested that she stated she did not want to. She has a history of cardiac catheterization 2000 to have reviewed this. She had normal coronaries. She's had chest discomfort off and on. She has had GI problems with surgical treatment of a hiatal hernia. She thinks she's had problems ever since then. She gets a golf ball sized swelling in her upper epigastric area. This happens sporadically. She'll get short of breath. She has trouble meeting swallowing. This makes her anxious. She cannot bring this on with activity. She tries to be walking treat her fibromyalgia and she doesn't necessarily bring on the symptoms with this. She will occasionally get some arm discomfort with this he is independent of the chest discomfort. She has some shortness of breath but is a long term smoker and is being evaluated for COPD. She denies any PND or orthopnea. She's not reporting any new palpitations, presyncope or syncope.  Allergies  Allergen Reactions  . 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 Sulfamethoxazole Anaphylaxis  . Vicodin (Hydrocodone-Acetaminophen) Nausea And Vomiting    Nausea, dizziness, sweating  . Codeine Anxiety    Sweating,nervous    Current Outpatient Prescriptions  Medication Sig Dispense Refill  . albuterol (PROVENTIL HFA;VENTOLIN HFA) 108 (90 BASE) MCG/ACT inhaler Inhale 2 puffs into the lungs every 6 (six) hours as needed. Shortness of breath      . albuterol (PROVENTIL) (2.5 MG/3ML) 0.083% nebulizer solution Take 2.5 mg by nebulization every 6 (six) hours as needed. Shortness of breath      . cyclobenzaprine (FLEXERIL) 10 MG tablet Take 5 mg by mouth 3 (three) times daily as needed. Muscle spasms      . fluticasone (FLONASE) 50 MCG/ACT nasal spray Place 2 sprays into the nose 2 (two) times daily.      Marland Kitchen HYDROcodone-acetaminophen  (NORCO/VICODIN) 5-325 MG per tablet Take by mouth as needed for pain. Takes 1/2      . ibuprofen (ADVIL,MOTRIN) 600 MG tablet Take 600 mg by mouth every 6 (six) hours as needed. For pain       . LORazepam (ATIVAN) 1 MG tablet Take 0.5 mg by mouth as needed for anxiety.       Marland Kitchen omeprazole (PRILOSEC) 20 MG capsule Take 20 mg by mouth 2 (two) times daily.      . Simethicone 80 MG TABS Take 80 mg by mouth as needed.       No current facility-administered medications for this visit.    Past Medical History  Diagnosis Date  . GERD (gastroesophageal reflux disease)   . Thyroid nodule   . Anxiety 11/11/11  . Asthma   . Herpes   . Seasonal allergies   . Arthritis   . Depression   . Fibromyalgia   . IBS (irritable bowel syndrome)   . DDD (degenerative disc disease)   . Bursitis     right shoulder    Past Surgical History  Procedure Laterality Date  . Hernia repair      Hiatal   . Cholecystectomy    . Tubal ligation  1983    Family History  Problem Relation Age of Onset  . Lung cancer Mother   . Arthritis Mother     Rheumatoid  . Bursitis Mother   . Heart attack Father 44    Died of MI  . Obesity Father   .  Fibromyalgia Sister   . Asthma Mother   . Asthma Child     History   Social History  . Marital Status: Divorced    Spouse Name: N/A    Number of Children: 2  . Years of Education: N/A   Occupational History  .     Social History Main Topics  . Smoking status: Current Every Day Smoker    Types: Cigarettes  . Smokeless tobacco: Never Used     Comment: started smoking in 20's.  Currently smoking 1/2-1ppd  . Alcohol Use: No  . Drug Use: No  . Sexually Active: Not on file   Other Topics Concern  . Not on file   Social History Narrative   6 year old daughter lives with her.     ROS:  Positive for dizziness, vertigo, irritable bowel syndrome, reflux, leg cramping and, joint swelling.  Otherwise as stated in the HPI and negative for all other  systems.  PHYSICAL EXAM BP 116/72  Pulse 87  Ht 5\' 3"  (1.6 m)  Wt 148 lb 6.4 oz (67.314 kg)  BMI 26.29 kg/m2 GENERAL:  Well appearing HEENT:  Pupils equal round and reactive, fundi not visualized, oral mucosa unremarkable NECK:  No jugular venous distention, waveform within normal limits, carotid upstroke brisk and symmetric, no bruits, no thyromegaly LYMPHATICS:  No cervical, inguinal adenopathy LUNGS:  Clear to auscultation bilaterally BACK:  No CVA tenderness CHEST:  Unremarkable HEART:  PMI not displaced or sustained,S1 and S2 within normal limits, no S3, no S4, no clicks, no rubs, no murmurs ABD:  Flat, positive bowel sounds normal in frequency in pitch, no bruits, no rebound, no guarding, no midline pulsatile mass, no hepatomegaly, no splenomegaly EXT:  2 plus pulses throughout, no edema, no cyanosis no clubbing SKIN:  No rashes no nodules NEURO:  Cranial nerves II through XII grossly intact, motor grossly intact throughout PSYCH:  Cognitively intact, oriented to person place and time  EKG:  NSR, rate 85, axis within normal limits, intervals within normal limits, no acute ST-T wave changes. 01/29/13 ASSESSMENT AND PLAN  CHEST PAIN:  This is atypical.  I will bring the patient back for a POET (Plain Old Exercise Test). This will allow me to screen for obstructive coronary disease, risk stratify and very importantly provide a prescription for exercise.  TOBACCO ABUSE:  We discussed this.  She is not a good Chantix candidate.  She cannot afford replacement therapy.

## 2013-02-17 ENCOUNTER — Emergency Department (HOSPITAL_COMMUNITY): Payer: Medicaid Other

## 2013-02-17 ENCOUNTER — Encounter (HOSPITAL_COMMUNITY): Payer: Self-pay | Admitting: *Deleted

## 2013-02-17 ENCOUNTER — Emergency Department (HOSPITAL_COMMUNITY)
Admission: EM | Admit: 2013-02-17 | Discharge: 2013-02-18 | Disposition: A | Discharge status: Home / Self Care | Payer: Medicaid Other | Attending: Emergency Medicine | Admitting: Emergency Medicine

## 2013-02-17 DIAGNOSIS — Z8639 Personal history of other endocrine, nutritional and metabolic disease: Secondary | ICD-10-CM | POA: Insufficient documentation

## 2013-02-17 DIAGNOSIS — J45909 Unspecified asthma, uncomplicated: Secondary | ICD-10-CM | POA: Insufficient documentation

## 2013-02-17 DIAGNOSIS — Z8619 Personal history of other infectious and parasitic diseases: Secondary | ICD-10-CM | POA: Insufficient documentation

## 2013-02-17 DIAGNOSIS — R5381 Other malaise: Secondary | ICD-10-CM | POA: Insufficient documentation

## 2013-02-17 DIAGNOSIS — Z8719 Personal history of other diseases of the digestive system: Secondary | ICD-10-CM | POA: Insufficient documentation

## 2013-02-17 DIAGNOSIS — Z862 Personal history of diseases of the blood and blood-forming organs and certain disorders involving the immune mechanism: Secondary | ICD-10-CM | POA: Insufficient documentation

## 2013-02-17 DIAGNOSIS — Z8739 Personal history of other diseases of the musculoskeletal system and connective tissue: Secondary | ICD-10-CM | POA: Insufficient documentation

## 2013-02-17 DIAGNOSIS — IMO0002 Reserved for concepts with insufficient information to code with codable children: Secondary | ICD-10-CM | POA: Insufficient documentation

## 2013-02-17 DIAGNOSIS — F411 Generalized anxiety disorder: Secondary | ICD-10-CM | POA: Insufficient documentation

## 2013-02-17 DIAGNOSIS — K219 Gastro-esophageal reflux disease without esophagitis: Secondary | ICD-10-CM | POA: Insufficient documentation

## 2013-02-17 DIAGNOSIS — R0789 Other chest pain: Secondary | ICD-10-CM | POA: Insufficient documentation

## 2013-02-17 DIAGNOSIS — R5383 Other fatigue: Secondary | ICD-10-CM | POA: Insufficient documentation

## 2013-02-17 DIAGNOSIS — F17200 Nicotine dependence, unspecified, uncomplicated: Secondary | ICD-10-CM | POA: Insufficient documentation

## 2013-02-17 DIAGNOSIS — Z79899 Other long term (current) drug therapy: Secondary | ICD-10-CM | POA: Insufficient documentation

## 2013-02-17 DIAGNOSIS — R1013 Epigastric pain: Secondary | ICD-10-CM

## 2013-02-17 DIAGNOSIS — R110 Nausea: Secondary | ICD-10-CM | POA: Insufficient documentation

## 2013-02-17 LAB — CBC WITH DIFFERENTIAL/PLATELET
Basophils Absolute: 0 10*3/uL (ref 0.0–0.1)
Basophils Relative: 0 % (ref 0–1)
Eosinophils Absolute: 0 10*3/uL (ref 0.0–0.7)
Eosinophils Relative: 0 % (ref 0–5)
HCT: 42.4 % (ref 36.0–46.0)
Hemoglobin: 14.4 g/dL (ref 12.0–15.0)
Lymphocytes Relative: 28 % (ref 12–46)
Lymphs Abs: 2.5 10*3/uL (ref 0.7–4.0)
MCH: 30.7 pg (ref 26.0–34.0)
MCHC: 34 g/dL (ref 30.0–36.0)
MCV: 90.4 fL (ref 78.0–100.0)
Monocytes Absolute: 0.5 10*3/uL (ref 0.1–1.0)
Monocytes Relative: 5 % (ref 3–12)
Neutro Abs: 5.9 10*3/uL (ref 1.7–7.7)
Neutrophils Relative %: 66 % (ref 43–77)
Platelets: 257 10*3/uL (ref 150–400)
RBC: 4.69 MIL/uL (ref 3.87–5.11)
RDW: 13.2 % (ref 11.5–15.5)
WBC: 8.9 10*3/uL (ref 4.0–10.5)

## 2013-02-17 MED ORDER — LORAZEPAM 2 MG/ML IJ SOLN
1.0000 mg | Freq: Once | INTRAMUSCULAR | Status: AC
  Administered 2013-02-17: 1 mg via INTRAVENOUS
  Filled 2013-02-17: qty 1

## 2013-02-17 MED ORDER — GI COCKTAIL ~~LOC~~
30.0000 mL | Freq: Once | ORAL | Status: AC
  Administered 2013-02-17: 30 mL via ORAL
  Filled 2013-02-17: qty 30

## 2013-02-17 MED ORDER — IOHEXOL 300 MG/ML  SOLN
100.0000 mL | Freq: Once | INTRAMUSCULAR | Status: AC | PRN
  Administered 2013-02-17: 100 mL via INTRAVENOUS

## 2013-02-17 MED ORDER — IOHEXOL 300 MG/ML  SOLN: 50.0000 mL | Freq: Once | INTRAMUSCULAR | Status: AC | PRN

## 2013-02-17 MED ORDER — PANTOPRAZOLE SODIUM 40 MG IV SOLR
40.0000 mg | Freq: Once | INTRAVENOUS | Status: AC
  Administered 2013-02-17: 40 mg via INTRAVENOUS
  Filled 2013-02-17: qty 40

## 2013-02-17 MED ORDER — ONDANSETRON HCL 4 MG/2ML IJ SOLN
4.0000 mg | Freq: Once | INTRAMUSCULAR | Status: AC
  Administered 2013-02-17: 4 mg via INTRAVENOUS
  Filled 2013-02-17: qty 2

## 2013-02-17 NOTE — ED Notes (Signed)
Pt had EKG at [facility],  Test and blood work at [facility],  Pt has papers with her,  She said it isn't her heart but gastro related per her studies,

## 2013-02-17 NOTE — ED Provider Notes (Signed)
History     CSN: [id number]  Arrival date & time 02/17/13  2200   First MD Initiated Contact with Patient 02/17/13 2255      Chief Complaint  Patient presents with  . Abdominal Pain  . Nausea    (Consider location/radiation/quality/duration/timing/severity/associated sxs/prior treatment) HPI Comments: Patient presents with 2 month history of upper abdominal pain that radiates to her chest. This is associated with belching and perceived difficulty swallowing. She has a history of hiatal hernia repair but states she cannot see Dr. Ewing Schlein anymore because she owes him thousands of dollars. She saw cardiology yesterday for the similar pain was scheduled for a stress test. She was also seen in ER earlier this month and told she needed cardiology followup. She clean catheterization in 2000. The pain is in her epigastric area and associated with nausea. The constant for the past 8 hours today. She feels that she's difficulty swallowing things but no problem swallowing her secretions. Denies any lower abdominal pain, fever, diarrhea, shortness of breath.  The history is provided by the patient.    Past Medical History  Diagnosis Date  . GERD (gastroesophageal reflux disease)   . Thyroid nodule   . Anxiety 11/11/11  . Asthma   . Herpes   . Seasonal allergies   . Arthritis   . Depression   . Fibromyalgia   . IBS (irritable bowel syndrome)   . DDD (degenerative disc disease)   . Bursitis     right shoulder    Past Surgical History  Procedure Laterality Date  . Hernia repair      Hiatal   . Cholecystectomy    . Tubal ligation  1983    Family History  Problem Relation Age of Onset  . Lung cancer Mother   . Arthritis Mother     Rheumatoid  . Bursitis Mother   . Heart attack Father 39    Died of MI  . Obesity Father   . Fibromyalgia Sister   . Asthma Mother   . Asthma Child     History  Substance Use Topics  . Smoking status: Current Every Day Smoker    Types: Cigarettes   . Smokeless tobacco: Never Used     Comment: started smoking in 20's.  Currently smoking 1/2-1ppd  . Alcohol Use: No    OB History   Grav Para Term Preterm Abortions TAB SAB Ect Mult Living                  Review of Systems  Constitutional: Negative for fever, activity change and appetite change.  HENT: Negative for congestion and rhinorrhea.   Eyes: Negative for visual disturbance.  Respiratory: Positive for chest tightness. Negative for cough and shortness of breath.   Cardiovascular: Negative for chest pain.  Gastrointestinal: Positive for nausea and abdominal pain. Negative for vomiting.  Genitourinary: Negative for dysuria, vaginal bleeding and vaginal discharge.  Musculoskeletal: Negative for back pain.  Skin: Negative for wound.  Neurological: Positive for weakness. Negative for dizziness and headaches.  A complete 10 system review of systems was obtained and all systems are negative except as noted in the HPI and PMH.    Allergies  Shellfish allergy; Sulfamethoxazole; and Codeine  Home Medications   Current Outpatient Rx  Name  Route  Sig  Dispense  Refill  . albuterol (PROVENTIL HFA;VENTOLIN HFA) 108 (90 BASE) MCG/ACT inhaler   Inhalation   Inhale 2 puffs into the lungs every 6 (six) hours as needed. Shortness  of breath         . albuterol (PROVENTIL) (2.5 MG/3ML) 0.083% nebulizer solution   Nebulization   Take 2.5 mg by nebulization every 6 (six) hours as needed. Shortness of breath         . fluticasone (FLONASE) 50 MCG/ACT nasal spray   Nasal   Place 2 sprays into the nose 2 (two) times daily.         Marland Kitchen HYDROcodone-acetaminophen (NORCO/VICODIN) 5-325 MG per tablet   Oral   Take 0.5 tablets by mouth at bedtime. Takes 1/2         . LORazepam (ATIVAN) 1 MG tablet   Oral   Take 0.5 mg by mouth every 8 (eight) hours as needed (anxiety).          Marland Kitchen omeprazole (PRILOSEC) 20 MG capsule   Oral   Take 20 mg by mouth 2 (two) times daily.          . Simethicone 80 MG TABS   Oral   Take 80 mg by mouth 2 (two) times daily as needed (gas/ heartburn).          . pantoprazole (PROTONIX) 20 MG tablet   Oral   Take 1 tablet (20 mg total) by mouth daily.   30 tablet   0     BP 118/75  Pulse 77  Temp(Src) 97.5 F (36.4 C) (Oral)  Resp 19  SpO2 94%  Physical Exam  Constitutional: She is oriented to person, place, and time. She appears well-developed and well-nourished. No distress.  HENT:  Head: Normocephalic and atraumatic.  Mouth/Throat: Oropharynx is clear and moist. No oropharyngeal exudate.  Eyes: Conjunctivae and EOM are normal. Pupils are equal, round, and reactive to light.  Neck: Normal range of motion. Neck supple.  Cardiovascular: Normal rate, regular rhythm and normal heart sounds.   No murmur heard. Pulmonary/Chest: Effort normal and breath sounds normal. No respiratory distress.  Abdominal: Soft. There is tenderness. There is no rebound and no guarding.  Epigastric tenderness  Musculoskeletal: Normal range of motion. She exhibits no edema and no tenderness.  Neurological: She is alert and oriented to person, place, and time. No cranial nerve deficit. She exhibits normal muscle tone. Coordination normal.  Skin: Skin is warm.    ED Course  Procedures (including critical care time)  Labs Reviewed  COMPREHENSIVE METABOLIC PANEL - Abnormal; Notable for the following:    Creatinine, Ser 0.46 (*)    Albumin 3.4 (*)    All other components within normal limits  URINALYSIS, ROUTINE W REFLEX MICROSCOPIC - Abnormal; Notable for the following:    Specific Gravity, Urine >1.046 (*)    All other components within normal limits  CBC WITH DIFFERENTIAL  LIPASE, BLOOD  TROPONIN I  TROPONIN I   Dg Chest 2 View  02/17/2013  *RADIOLOGY REPORT*  Clinical Data: Abdominal pain.  Nausea.  CHEST - 2 VIEW  Comparison: 01/29/2013 and multiple prior examinations.  Findings: Diffuse nonspecific interstitial prominence.   Bilateral apical scarring and emphysematous changes.  Surgical clips in the pelvis.  No plain film evidence of free air.  Gaseous distention of colon in the left upper quadrant.  IMPRESSION: No acute cardiopulmonary disease.  Chronic interstitial prominence in the lungs and emphysema.   Original Report Authenticated By: Andreas Newport, M.D.    Ct Abdomen Pelvis W Contrast  02/17/2013  *RADIOLOGY REPORT*  Clinical Data: Chest discomfort.  Chest pain.  Abdominal pain. History of cardiac catheterization.  Emesis.  CT  ABDOMEN AND PELVIS WITH CONTRAST  Technique:  Multidetector CT imaging of the abdomen and pelvis was performed following the standard protocol during bolus administration of intravenous contrast.  Contrast: OMNIPAQUE IOHEXOL 300 MG/ML  SOLN  Comparison: 01/02/2013.  Findings: Lung Bases: Unchanged diffuse prominent interstitial markings.  Patulous distal thoracic esophagus.  Liver:  Normal variant Riedel's lobe of the liver.  No hepatic mass or biliary ductal dilation.  No acute abnormality.  Spleen:  Normal.  Gallbladder:  Surgically absent.  Common bile duct:  Normal.  Pancreas:  Normal.  Adrenal glands:  Normal.  Kidneys:  Normal renal enhancement and delayed excretion of contrast.  The ureters appear within normal limits.  Stomach:  Surgical changes at the gastroesophageal junction.  No inflammatory changes.  Small bowel:  No obstruction.  Distended with gas.  No inflammatory changes.  Colon:   Normal appendix.  Gaseous distention of portions of the colon.  No colonic inflammatory changes.  Diverticulosis without diverticulitis.  Pelvic Genitourinary:  No free fluid.  Urinary bladder appears normal.  Uterus and adnexa have a physiologic appearance.  Bones:  No aggressive osseous lesions.  Right hip osteoarthritis is present with subchondral cysts.  Mild left hip osteoarthritis.  L5- S1 predominant lumbar spondylosis.  Vasculature: Mild atherosclerosis.  No acute vascular abnormality.   IMPRESSION:  1.  No acute abnormality. 2.  Postoperative changes at the gastroesophageal junction. 3.  Cholecystectomy.  Postcholecystectomy dilation of the common bile duct.   Original Report Authenticated By: Andreas Newport, M.D.      1. Epigastric pain       MDM  Intermittent upper abdominal pain it radiates to the chest associated with nausea. Recent cardiology visit with stress test scheduled. EKG nonischemic.  EKG unchanged nonischemic. Troponin negative. Patient saw cardiology yesterday and was scheduled for stress test. Her pain was not felt to be cardiac.  On assessment today she has epigastric tenderness that is atypical for ACS. Has been constant for several hours. Her symptoms were relieved with GI cocktail. We'll restart her protonix which she was taken off of.   low suspicion for ACS or PE today. Advised followup with cardiology as scheduled for stress test as well as GI for endoscopy.   Date: 02/17/2013  Rate: 74  Rhythm: normal sinus rhythm  QRS Axis: normal  Intervals: normal  ST/T Wave abnormalities: normal  Conduction Disutrbances:none  Narrative Interpretation:   Old EKG Reviewed: unchanged      Glynn Octave, MD 02/18/13 [phone]

## 2013-02-17 NOTE — ED Notes (Signed)
[id number]> Expected date:<BR> Expected time:<BR> Means of arrival:<BR> Comments:<BR> Triage 1

## 2013-02-18 ENCOUNTER — Telehealth: Payer: Self-pay | Admitting: Gastroenterology

## 2013-02-18 LAB — URINALYSIS, ROUTINE W REFLEX MICROSCOPIC
Bilirubin Urine: NEGATIVE
Glucose, UA: NEGATIVE mg/dL
Hgb urine dipstick: NEGATIVE
Ketones, ur: NEGATIVE mg/dL
Leukocytes, UA: NEGATIVE
Nitrite: NEGATIVE
Protein, ur: NEGATIVE mg/dL
Specific Gravity, Urine: >1.046 — ABNORMAL HIGH (ref 1.005–1.030)
Urobilinogen, UA: 0.2 mg/dL (ref 0.0–1.0)
pH: 6 (ref 5.0–8.0)

## 2013-02-18 LAB — COMPREHENSIVE METABOLIC PANEL
ALT: 11 U/L (ref 0–35)
AST: 12 U/L (ref 0–37)
Albumin: 3.4 g/dL — ABNORMAL LOW (ref 3.5–5.2)
Alkaline Phosphatase: 79 U/L (ref 39–117)
BUN: 9 mg/dL (ref 6–23)
CO2: 22 mEq/L (ref 19–32)
Calcium: 9.2 mg/dL (ref 8.4–10.5)
Chloride: 103 mEq/L (ref 96–112)
Creatinine, Ser: 0.46 mg/dL — ABNORMAL LOW (ref 0.50–1.10)
GFR calc Af Amer: >90 mL/min (ref >90)
GFR calc non Af Amer: >90 mL/min (ref >90)
Glucose, Bld: 96 mg/dL (ref 70–99)
Potassium: 3.6 mEq/L (ref 3.5–5.1)
Sodium: 136 mEq/L (ref 135–145)
Total Bilirubin: 0.4 mg/dL (ref 0.3–1.2)
Total Protein: 6.8 g/dL (ref 6.0–8.3)

## 2013-02-18 LAB — LIPASE, BLOOD: Lipase: 50 U/L (ref 11–59)

## 2013-02-18 LAB — TROPONIN I
Troponin I: <0.3 ng/mL (ref <0.30)
Troponin I: <0.3 ng/mL (ref <0.30)

## 2013-02-18 MED ORDER — PANTOPRAZOLE SODIUM 20 MG PO TBEC: 20.0000 mg | DELAYED_RELEASE_TABLET | Freq: Every day | ORAL | Status: DC

## 2013-02-18 NOTE — Telephone Encounter (Signed)
lmom for pt to call back

## 2013-02-18 NOTE — ED Notes (Signed)
Pt was able to ambulate with Clinical research associate with a steady gait.  Pt sts she is still sleepy from the meds the nurse gave her.  Pt also she is worried about getting home while she is still sleepy b/c she has no one to drive her home.

## 2013-02-23 NOTE — Telephone Encounter (Signed)
Patient returned call and was offered an appt with Doug Sou, PA on 02/25/13 9:15

## 2013-02-23 NOTE — Telephone Encounter (Signed)
Left message for patient to call back  

## 2013-02-25 ENCOUNTER — Ambulatory Visit (INDEPENDENT_AMBULATORY_CARE_PROVIDER_SITE_OTHER): Payer: Medicaid Other | Admitting: Gastroenterology

## 2013-02-25 ENCOUNTER — Encounter: Payer: Self-pay | Admitting: Gastroenterology

## 2013-02-25 ENCOUNTER — Other Ambulatory Visit (INDEPENDENT_AMBULATORY_CARE_PROVIDER_SITE_OTHER): Payer: Medicaid Other

## 2013-02-25 VITALS — BP 100/60 | HR 60 | Ht 63.0 in | Wt 146.4 lb

## 2013-02-25 DIAGNOSIS — R1013 Epigastric pain: Secondary | ICD-10-CM

## 2013-02-25 DIAGNOSIS — R1310 Dysphagia, unspecified: Secondary | ICD-10-CM

## 2013-02-25 DIAGNOSIS — R140 Abdominal distension (gaseous): Secondary | ICD-10-CM

## 2013-02-25 DIAGNOSIS — R143 Flatulence: Secondary | ICD-10-CM

## 2013-02-25 DIAGNOSIS — IMO0001 Reserved for inherently not codable concepts without codable children: Secondary | ICD-10-CM

## 2013-02-25 DIAGNOSIS — R142 Eructation: Secondary | ICD-10-CM

## 2013-02-25 DIAGNOSIS — R141 Gas pain: Secondary | ICD-10-CM

## 2013-02-25 LAB — IGA: IgA: 265 mg/dL (ref 68–378)

## 2013-02-25 LAB — SEDIMENTATION RATE: Sed Rate: 16 mm/hr (ref 0–22)

## 2013-02-25 LAB — C-REACTIVE PROTEIN: CRP: 0.6 mg/dL (ref 0.5–20.0)

## 2013-02-25 LAB — TSH: TSH: 0.35 u[IU]/mL (ref 0.35–5.50)

## 2013-02-25 MED ORDER — HYOSCYAMINE SULFATE 0.125 MG SL SUBL: 0.1250 mg | SUBLINGUAL_TABLET | Freq: Four times a day (QID) | SUBLINGUAL | Status: DC | PRN

## 2013-02-25 MED ORDER — OMEPRAZOLE 40 MG PO CPDR: 40.0000 mg | DELAYED_RELEASE_CAPSULE | Freq: Two times a day (BID) | ORAL | Status: DC

## 2013-02-25 NOTE — Progress Notes (Signed)
02/25/2013 Sheri Rivera [id number] Jun 26, 1961   HISTORY OF PRESENT ILLNESS:  Patient is a 52 year old female who presents to our office today with several GI complaints.  She complains mostly of upper abdominal pain with excessive gas and bloating.  Says that this has been occurring intermittently over the past year, however, has worsened within the past 4 months.  Says that these symptoms come randomly; says that they occur with minutes to several hours after she eats, but sometimes she is able to eat "a feast" without any problems.  Says that it feels like spasm and gas in her upper abdomen and she constantly has to rub it or put a heating pad on it to make it feel better.  Is always belching or passing foul smelling flatulence.  Sometimes feels like food gets stuck when it goes down.  These symptoms cause her to feel short of breath and have pressure/discomfort in her chest.  She saw cardiology and they do not think that it is her heart, but she is scheduled for a stress test.  She had a hiatal hernia repair in the past and a cholecystectomy.  Was in the ED recently where CT scan of the abdomen and pelvis was negative along with CBC, CMP, and lipase.  Is taking prilosec 20 mg BID right now along with Gas-X several times a day.    Says that her BM's alternate from constipation to diarrhea.  No dark or bloody stools.  She has seen Dr. Ewing Schlein in the past, several years ago, but says that she did not have insurance at the time so she owes them $3000, which is why she did not return to that practice.  She reports that she had an EGD and a colonoscopy 8-9 years ago by Dr. Ewing Schlein, which were reportedly normal (we do not have those records, but are requesting that they send them to [place]).     Past Medical History  Diagnosis Date  . GERD (gastroesophageal reflux disease)   . Thyroid nodule   . Anxiety 11/11/11  . Asthma   . Herpes   . Seasonal allergies   . Arthritis   . Depression   . Fibromyalgia   .  IBS (irritable bowel syndrome)   . DDD (degenerative disc disease)   . Bursitis     right shoulder  . Heart murmur     as a child per pt   Past Surgical History  Procedure Laterality Date  . Hernia repair      Hiatal   . Cholecystectomy    . Tubal ligation  1983    reports that she has been smoking Cigarettes.  She has been smoking about 0.00 packs per day. She has never used smokeless tobacco. She reports that she does not drink alcohol or use illicit drugs. family history includes Arthritis in her mother; Asthma in her child and mother; Bursitis in her mother; Fibromyalgia in her sister; Heart attack (age of onset: 50) in her father; Lung cancer in her mother; and Obesity in her father. Allergies  Allergen Reactions  . 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 Sulfamethoxazole Anaphylaxis  . Codeine Anxiety    Sweating,nervous      Outpatient Encounter Prescriptions as of 02/25/2013  Medication Sig Dispense Refill  . albuterol (PROVENTIL HFA;VENTOLIN HFA) 108 (90 BASE) MCG/ACT inhaler Inhale 2 puffs into the lungs every 6 (six) hours as needed. Shortness of breath      . albuterol (PROVENTIL) (  2.5 MG/3ML) 0.083% nebulizer solution Take 2.5 mg by nebulization every 6 (six) hours as needed. Shortness of breath      . fluticasone (FLONASE) 50 MCG/ACT nasal spray Place 2 sprays into the nose 2 (two) times daily.      Marland Kitchen HYDROcodone-acetaminophen (NORCO/VICODIN) 5-325 MG per tablet Take 0.5 tablets by mouth at bedtime. Takes 1/2      . LORazepam (ATIVAN) 1 MG tablet Take 0.5 mg by mouth every 8 (eight) hours as needed (anxiety).       Marland Kitchen omeprazole (PRILOSEC) 20 MG capsule Take 20 mg by mouth 2 (two) times daily.      . Simethicone 80 MG TABS Take 80 mg by mouth 2 (two) times daily as needed (gas/ heartburn).       . [DISCONTINUED] pantoprazole (PROTONIX) 20 MG tablet Take 1 tablet (20 mg total) by mouth daily.  30 tablet  0   No facility-administered encounter medications on file  as of 02/25/2013.     REVIEW OF SYSTEMS  : All other systems reviewed and negative except where noted in the History of Present Illness.   PHYSICAL EXAM: BP 100/60  Pulse 60  Ht 5\' 3"  (1.6 m)  Wt 146 lb 6.4 oz (66.407 kg)  BMI 25.94 kg/m2 General: Well developed white female in no acute distress; seems slightly anxious Head: Normocephalic and atraumatic Eyes:  sclerae anicteric, conjunctive pink. Ears: Normal auditory acuity Lungs: Clear throughout to auscultation Heart: Regular rate and rhythm Abdomen: Soft, non-distended. No masses or hepatomegaly noted. Normal bowel sounds.  Mild upper abdominal TTP without R/R/G. Musculoskeletal: Symmetrical with no gross deformities  Skin: No lesions on visible extremities Extremities: No edema  Neurological: Alert oriented x 4, grossly nonfocal Psychological:  Alert and cooperative. Normal mood and affect  ASSESSMENT AND PLAN: -Epigastric/upper abdominal pain -Dysphagia -Gas and bloating  *Patient's symptoms sound like they may all be secondary to IBS/anxiety; recent CT scan of the abdomen and pelvis negative along with CBC, CMP, and lipase.  She is s/p hiatal hernia repair in the past and reportedly had dilation of her esophagus previously as well, however.  We will schedule her for an EGD.  Will increase her prilosec to 40 mg BID.  She will try a lactose free diet and those guidelines were given to her.  Will check labs for celiac, TSH, and sed rate/CRP.  Will give her levsin SL to use q6 hours prn.  She will try a daily fiber supplement to help regulate her BM's more (recommended citrucel specifically since she already has a lot of gas).  She is using Gas-X already, but I recommended that she try the mylicon drops instead to see they work any different/better for her.

## 2013-02-25 NOTE — Patient Instructions (Addendum)
You have been scheduled for an endoscopy with propofol. Please follow written instructions given to you at your visit today. If you use inhalers (even only as needed), please bring them with you on the day of your procedure.  Your physician has requested that you go to the basement for lab work before leaving today.  Today you have been given a Lactose free diet to read over and follow.  Try the liquid gas -x and see if that helps better.  Use Citrucel every day, this is over the counter.  We will try and obtain your [facility] records (Dr. Ewing Schlein) with the ROI you signed today.  Thank you for choosing me and [facility].

## 2013-02-26 LAB — TISSUE TRANSGLUTAMINASE, IGA: Tissue Transglutaminase Ab, IgA: 7.6 U/mL (ref <20)

## 2013-02-26 NOTE — Progress Notes (Signed)
Reviewed and agree with management. Robert D. Kaplan, M.D., FACG  

## 2013-03-02 ENCOUNTER — Other Ambulatory Visit: Payer: Medicaid Other

## 2013-03-05 ENCOUNTER — Other Ambulatory Visit: Payer: Medicaid Other

## 2013-03-06 NOTE — Progress Notes (Signed)
Reviewed and agree with management. Robert D. Kaplan, M.D., FACG  

## 2013-03-08 ENCOUNTER — Ambulatory Visit
Admission: RE | Admit: 2013-03-08 | Discharge: 2013-03-08 | Disposition: A | Discharge status: Home / Self Care | Payer: Medicaid Other | Source: Ambulatory Visit | Attending: Family Medicine | Admitting: Family Medicine

## 2013-03-08 DIAGNOSIS — M25512 Pain in left shoulder: Secondary | ICD-10-CM

## 2013-03-09 ENCOUNTER — Ambulatory Visit: Payer: Medicaid Other | Admitting: Gastroenterology

## 2013-03-11 ENCOUNTER — Ambulatory Visit: Payer: Medicaid Other | Admitting: Internal Medicine

## 2013-03-12 ENCOUNTER — Ambulatory Visit (INDEPENDENT_AMBULATORY_CARE_PROVIDER_SITE_OTHER): Payer: Medicaid Other | Admitting: Internal Medicine

## 2013-03-12 ENCOUNTER — Encounter: Payer: Self-pay | Admitting: Internal Medicine

## 2013-03-12 VITALS — BP 122/80 | HR 94 | Temp 98.1°F | Ht 63.0 in | Wt 145.4 lb

## 2013-03-12 DIAGNOSIS — F17200 Nicotine dependence, unspecified, uncomplicated: Secondary | ICD-10-CM

## 2013-03-12 DIAGNOSIS — R0602 Shortness of breath: Secondary | ICD-10-CM

## 2013-03-12 MED ORDER — BUDESONIDE-FORMOTEROL FUMARATE 160-4.5 MCG/ACT IN AERO: INHALATION_SPRAY | RESPIRATORY_TRACT | Status: DC

## 2013-03-12 NOTE — Progress Notes (Signed)
Subjective:    Patient ID: Sheri Rivera, female    DOB: 1961-11-01  MRN: [id number]  HPI  45 yowf smoker grew up in house of smokers with freq asthma problems couldn't run @ gym in 7th grade started smoking mid 20s with worse breathing problems since 2010 referred 07/10/2012 to pulmonary clinic by Dr Catalina Pizza AT [facility] in [place].  07/10/2012 1st pulmonary ov cc  Worsening sob x 3 years highly variable to point where does ok x 3 days and ov   typical bad day with last neb 5 and half hours prior to ov, not sure it helped.  Last prednisone opened her up x 2 weeks after to point where can take kids to park. Does not take advair as maint because worried it causes pneumonia.   No unusual cough, purulent sputum or Reflux symptoms on present rx. rec The key is to stop smoking completely before smoking completely stops you!  Please schedule a follow up office visit in 6 weeks, call sooner if needed with pft's     08/27/2012 f/u ov/Blessen Kimbrough  No show  03/12/2013 f/u ov/Belton Peplinski  Still smoking Here for pft's "for disability filing" but could not complete due to cough. Chief Complaint  Patient presents with  . Acute Visit    Increased SOB since this am since she did not take her inhaled meds. She tried to do PFT today, but could niot complete test due to cough.    no excess mucus.  Sob worse with cough but also if does more than room to room walking  No obvious daytime variabilty   or cp or chest tightness, subjective wheeze overt sinus or hb symptoms. No unusual exp hx or h/o childhood pna/ asthma or premature birth to her knowledge.    Presenlty Sleeping ok without nocturnal  or early am exacerbation  of respiratory  c/o's or need for noct saba. Also denies any obvious fluctuation of symptoms with weather or environmental changes or other aggravating or alleviating factors except as outlined above.  Current Medications, Allergies, Past Medical History, Past Surgical History, Family  History, and Social History were reviewed in [employer] record.  ROS  The following are not active complaints unless bolded sore throat, dysphagia, dental problems, itching, sneezing,  nasal congestion or excess/ purulent secretions, ear ache,   fever, chills, sweats, unintended wt loss, pleuritic or exertional cp, hemoptysis,  orthopnea pnd or leg swelling, presyncope, palpitations, heartburn, abdominal pain, anorexia, nausea, vomiting, diarrhea  or change in bowel or urinary habits, change in stools or urine, dysuria,hematuria,  rash, arthralgias, visual complaints, headache, numbness weakness or ataxia or problems with walking or coordination,  change in mood/affect or memory.             Objective:   Physical Exam Wt Readings from Last 3 Encounters:  03/12/13 145 lb 6.4 oz (65.953 kg)  02/25/13 146 lb 6.4 oz (66.407 kg)  02/16/13 148 lb 6.4 oz (67.314 kg)    HEENT mild turbinate edema.  Oropharynx no thrush or excess pnd or cobblestoning.  No JVD or cervical adenopathy.  No  accessory muscle hypertrophy. Trachea midline, nl thryroid. Chest was min hyperinflated by percussion with slt  diminished breath sounds and min increased exp time without wheeze. Hoover sign positive at  end inspiration. Regular rate and rhythm without murmur gallop or rub or increase P2 or edema.  Abd: no hsm, nl excursion. Ext warm without cyanosis or clubbing.  cxr 02/17/13 No acute cardiopulmonary disease. Chronic interstitial prominence  in the lungs and emphysema.       Assessment & Plan:

## 2013-03-12 NOTE — Patient Instructions (Addendum)
symbicort 160 Take 2 puffs first thing in am and then another 2 puffs about 12 hours later.     Only use your albuterol (proaire first, nebulizer)  as a rescue medication to be used if you can't catch your breath by resting or doing a relaxed purse lip breathing pattern. The less you use it, the better it will work when you need it.   Work on inhaler technique:  relax and gently blow all the way out then take a nice smooth deep breath back in, triggering the inhaler at same time you start breathing in.  Hold for up to 5 seconds if you can.  Rinse and gargle with water when done   If your mouth or throat starts to bother you,   I suggest you time the inhaler to your dental care and after using the inhaler(s) brush teeth and tongue with a baking soda containing toothpaste and when you rinse this out, gargle with it first to see if this helps your mouth and throat.    The key is to stop smoking completely before smoking completely stops you!   Please schedule a follow up office visit in 4 weeks, sooner if needed

## 2013-03-13 ENCOUNTER — Encounter: Payer: Medicaid Other | Admitting: Nurse Practitioner

## 2013-03-14 NOTE — Assessment & Plan Note (Signed)
Not really clear how much is copd vs asthma vs    Upper airway cough syndrome, so named because it's frequently impossible to sort out how much is  CR/sinusitis with freq throat clearing (which can be related to primary GERD)   vs  causing  secondary (" extra esophageal")  GERD from wide swings in gastric pressure that occur with throat clearing, often  promoting self use of mint and menthol lozenges that reduce the lower esophageal sphincter tone and exacerbate the problem further in a cyclical fashion.   These are the same pts (now being labeled as having "irritable larynx syndrome" by some cough centers) who not infrequently have a history of having failed to tolerate ace inhibitors,  dry powder inhalers or biphosphonates or report having atypical reflux symptoms that don't respond to standard doses of PPI , and are easily confused as having aecopd or asthma flares by even experienced allergists/ pulmonologists.   rec trial of symbicort 160 2bid, continue rx for gerd and work harder on smoking cessation  The proper method of use, as well as anticipated side effects, of a metered-dose inhaler are discussed and demonstrated to the patient. Improved effectiveness after extensive coaching during this visit to a level of approximately  50% despite actively inhaling cigarettes so may not be able to continue this route if not more effective and because cough is so prominent not a good candidate for dpi

## 2013-03-14 NOTE — Assessment & Plan Note (Signed)

## 2013-03-16 ENCOUNTER — Ambulatory Visit: Payer: Medicaid Other | Admitting: Internal Medicine

## 2013-03-17 ENCOUNTER — Encounter: Payer: Medicaid Other | Admitting: Physician Assistant

## 2013-03-18 ENCOUNTER — Ambulatory Visit: Payer: Medicaid Other | Admitting: Internal Medicine

## 2013-03-25 ENCOUNTER — Telehealth: Payer: Self-pay | Admitting: Internal Medicine

## 2013-03-25 ENCOUNTER — Ambulatory Visit (INDEPENDENT_AMBULATORY_CARE_PROVIDER_SITE_OTHER): Payer: Medicaid Other | Admitting: Internal Medicine

## 2013-03-25 DIAGNOSIS — R0602 Shortness of breath: Secondary | ICD-10-CM

## 2013-03-25 NOTE — Progress Notes (Signed)
PFT done today. 

## 2013-03-25 NOTE — Telephone Encounter (Signed)
LMTCB

## 2013-03-25 NOTE — Telephone Encounter (Signed)
Spoke with pt  I advised will fax her PFT's once they are signed by MW She verbalized understanding and states nothing further needed Will hold in my basket until done

## 2013-03-30 ENCOUNTER — Telehealth: Payer: Self-pay | Admitting: Gastroenterology

## 2013-03-30 ENCOUNTER — Encounter: Payer: Self-pay | Admitting: Internal Medicine

## 2013-03-30 ENCOUNTER — Telehealth: Payer: Self-pay | Admitting: *Deleted

## 2013-03-30 DIAGNOSIS — J449 Chronic obstructive pulmonary disease, unspecified: Secondary | ICD-10-CM | POA: Insufficient documentation

## 2013-03-30 NOTE — Telephone Encounter (Signed)
no

## 2013-03-30 NOTE — Telephone Encounter (Signed)
PFT was faxed to the number requested

## 2013-03-30 NOTE — Telephone Encounter (Signed)
LMTCB

## 2013-03-30 NOTE — Telephone Encounter (Signed)
Pt is aware of results. She is needing these results faxed in to her disability company.  Verlon Au - did you fax these results. The pt was asking. Thanks.

## 2013-03-30 NOTE — Telephone Encounter (Signed)
Message copied by Christen Butter on Mon Mar 30, 2013  2:41 PM ------      Message from: Sandrea Hughs B      Created: Mon Mar 30, 2013  1:44 PM       Mild copd no change in rx       need f/u ov at some point in next 4-6 weeks to go over specifics and options for future rx       ------

## 2013-03-31 ENCOUNTER — Encounter: Payer: Medicaid Other | Admitting: Gastroenterology

## 2013-03-31 NOTE — Telephone Encounter (Signed)
Left detailed msg for the pt letting her know that I faxed the results of her PFT yesterday to the number provided in previous phone note

## 2013-04-07 ENCOUNTER — Telehealth: Payer: Self-pay | Admitting: Gastroenterology

## 2013-04-07 MED ORDER — PANTOPRAZOLE SODIUM 20 MG PO TBEC: 40.0000 mg | DELAYED_RELEASE_TABLET | Freq: Two times a day (BID) | ORAL | Status: DC

## 2013-04-07 NOTE — Telephone Encounter (Signed)
Refilled Prilosec per OV note on 02/25/2013. Patient aware.

## 2013-04-09 ENCOUNTER — Ambulatory Visit: Payer: Medicaid Other | Admitting: Internal Medicine

## 2013-04-29 ENCOUNTER — Telehealth: Payer: Self-pay | Admitting: Internal Medicine

## 2013-04-29 NOTE — Telephone Encounter (Signed)
Pt is aware and she stated then we should be receiving it in a few days then. Nothing further was needed

## 2013-04-29 NOTE — Telephone Encounter (Signed)
i am not aware of any paperwork for this pt

## 2013-04-29 NOTE — Telephone Encounter (Signed)
i spoke pt and she stated she has been homeless for a couple months now waiting for her disability to come through. She stated disability is missing some information and had faxed over a form stating what they still need. Please advise if this was received. Thanks MW.

## 2013-05-05 ENCOUNTER — Telehealth: Payer: Self-pay | Admitting: Internal Medicine

## 2013-05-05 ENCOUNTER — Ambulatory Visit: Payer: Medicaid Other | Admitting: Internal Medicine

## 2013-05-05 NOTE — Telephone Encounter (Signed)
LM with [employer] to see if they have seen any paperwork on this patient. Carron Curie, CMA

## 2013-05-05 NOTE — Telephone Encounter (Signed)
Duplicate message. See phone note from earlier in regards to same thing

## 2013-05-05 NOTE — Telephone Encounter (Signed)
Pt calling to see if we have received her paperwork yet from disability. They were suppose to fax this over. Please advise MW if you have received this yet thanks

## 2013-05-05 NOTE — Telephone Encounter (Signed)
Have not seen any yet.

## 2013-05-06 NOTE — Telephone Encounter (Signed)
Called [employer] - received their VM - lmtcb

## 2013-05-07 NOTE — Telephone Encounter (Signed)
LMOM TCB x1 Called [employer], spoke with Patsy who stated that they ([employer]) have filled out and faxed everything that was requested (14 pages sent on 6.6.14).  Per Patsy, if patient would like to call her to follow up to make sure nothing additional is needed she is welcome to do so >> [phone] ext [phone].

## 2013-05-08 NOTE — Telephone Encounter (Signed)
Spoke with patient, made her aware of this. She verbalized understanding I have also given her patsy's # and extension if needed. Nothing further needed at this time

## 2013-06-25 ENCOUNTER — Emergency Department (HOSPITAL_COMMUNITY)
Admission: EM | Admit: 2013-06-25 | Discharge: 2013-06-25 | Disposition: A | Discharge status: Home / Self Care | Payer: Medicaid Other | Attending: Emergency Medicine | Admitting: Emergency Medicine

## 2013-06-25 ENCOUNTER — Encounter (HOSPITAL_COMMUNITY): Payer: Self-pay | Admitting: Emergency Medicine

## 2013-06-25 ENCOUNTER — Emergency Department (HOSPITAL_COMMUNITY): Payer: Medicaid Other

## 2013-06-25 DIAGNOSIS — Z79899 Other long term (current) drug therapy: Secondary | ICD-10-CM | POA: Insufficient documentation

## 2013-06-25 DIAGNOSIS — K589 Irritable bowel syndrome without diarrhea: Secondary | ICD-10-CM | POA: Insufficient documentation

## 2013-06-25 DIAGNOSIS — R0789 Other chest pain: Secondary | ICD-10-CM

## 2013-06-25 DIAGNOSIS — F3289 Other specified depressive episodes: Secondary | ICD-10-CM | POA: Insufficient documentation

## 2013-06-25 DIAGNOSIS — F329 Major depressive disorder, single episode, unspecified: Secondary | ICD-10-CM | POA: Insufficient documentation

## 2013-06-25 DIAGNOSIS — F419 Anxiety disorder, unspecified: Secondary | ICD-10-CM

## 2013-06-25 DIAGNOSIS — IMO0002 Reserved for concepts with insufficient information to code with codable children: Secondary | ICD-10-CM | POA: Insufficient documentation

## 2013-06-25 DIAGNOSIS — R6884 Jaw pain: Secondary | ICD-10-CM | POA: Insufficient documentation

## 2013-06-25 DIAGNOSIS — K219 Gastro-esophageal reflux disease without esophagitis: Secondary | ICD-10-CM | POA: Insufficient documentation

## 2013-06-25 DIAGNOSIS — F411 Generalized anxiety disorder: Secondary | ICD-10-CM | POA: Insufficient documentation

## 2013-06-25 DIAGNOSIS — R110 Nausea: Secondary | ICD-10-CM | POA: Insufficient documentation

## 2013-06-25 DIAGNOSIS — J45909 Unspecified asthma, uncomplicated: Secondary | ICD-10-CM | POA: Insufficient documentation

## 2013-06-25 DIAGNOSIS — F17200 Nicotine dependence, unspecified, uncomplicated: Secondary | ICD-10-CM | POA: Insufficient documentation

## 2013-06-25 LAB — CBC
HCT: 48.7 % — ABNORMAL HIGH (ref 36.0–46.0)
Hemoglobin: 16.6 g/dL — ABNORMAL HIGH (ref 12.0–15.0)
MCH: 31.9 pg (ref 26.0–34.0)
MCHC: 34.1 g/dL (ref 30.0–36.0)
MCV: 93.7 fL (ref 78.0–100.0)
Platelets: 272 10*3/uL (ref 150–400)
RBC: 5.2 MIL/uL — ABNORMAL HIGH (ref 3.87–5.11)
RDW: 14 % (ref 11.5–15.5)
WBC: 11.3 10*3/uL — ABNORMAL HIGH (ref 4.0–10.5)

## 2013-06-25 LAB — BASIC METABOLIC PANEL
BUN: 10 mg/dL (ref 6–23)
CO2: 22 mEq/L (ref 19–32)
Calcium: 9.8 mg/dL (ref 8.4–10.5)
Chloride: 100 mEq/L (ref 96–112)
Creatinine, Ser: 0.57 mg/dL (ref 0.50–1.10)
GFR calc Af Amer: >90 mL/min (ref >90)
GFR calc non Af Amer: >90 mL/min (ref >90)
Glucose, Bld: 101 mg/dL — ABNORMAL HIGH (ref 70–99)
Potassium: 3.3 mEq/L — ABNORMAL LOW (ref 3.5–5.1)
Sodium: 138 mEq/L (ref 135–145)

## 2013-06-25 LAB — POCT I-STAT TROPONIN I
Troponin i, poc: 0.01 ng/mL (ref 0.00–0.08)
Troponin i, poc: 0 ng/mL (ref 0.00–0.08)

## 2013-06-25 LAB — PRO B NATRIURETIC PEPTIDE: Pro B Natriuretic peptide (BNP): 71.3 pg/mL (ref 0–125)

## 2013-06-25 MED ORDER — LORAZEPAM 2 MG/ML IJ SOLN
1.0000 mg | Freq: Once | INTRAMUSCULAR | Status: AC
  Administered 2013-06-25: 1 mg via INTRAVENOUS
  Filled 2013-06-25: qty 1

## 2013-06-25 MED ORDER — POTASSIUM CHLORIDE CRYS ER 20 MEQ PO TBCR
40.0000 meq | EXTENDED_RELEASE_TABLET | Freq: Once | ORAL | Status: AC
  Administered 2013-06-25: 40 meq via ORAL
  Filled 2013-06-25: qty 2

## 2013-06-25 MED ORDER — SODIUM CHLORIDE 0.9 % IV BOLUS (SEPSIS)
1000.0000 mL | Freq: Once | INTRAVENOUS | Status: AC
  Administered 2013-06-25: 1000 mL via INTRAVENOUS

## 2013-06-25 MED ORDER — GI COCKTAIL ~~LOC~~
30.0000 mL | Freq: Once | ORAL | Status: AC
  Administered 2013-06-25: 30 mL via ORAL
  Filled 2013-06-25: qty 30

## 2013-06-25 NOTE — ED Notes (Signed)
Pt states that her chest started hurting about 1 hour ago along with SOB and numbness and tingling in finger. States she took .5 of lorazepam. Center of chest no pain elsewhere

## 2013-06-25 NOTE — ED Provider Notes (Signed)
CSN: [id number]     Arrival date & time 06/25/13  4401 History     First MD Initiated Contact with Patient 06/25/13 [phone]     Chief Complaint  Patient presents with  . Chest Pain  . Anxiety   (Consider location/radiation/quality/duration/timing/severity/associated sxs/prior Treatment) HPI Comments: Patient is a 52 yo F PMHx significant for GERD, Asthma, Depression, Anxiety, IBS, Fibromyalgia presenting to the ED for acute onset chest heaviness w/ radiation to left jaw that began an hour ago. Patient states he attempted to take her Ativan and Albuterol inhaler without relief. No alleviating or aggravating factors. Patient seen by a  cardiologist a few month ago as advised at previous ED visit. Patient states she was unable to undergo her stress testing d/t her fibromyalgia and arthritis. Patient states the cardiologist told her to follow up in six months for further testing. Patient's last cardiac catheterziation was in 2000 and without findings. Patient denies any new or changing SOB or nausea from baseline. Denies fevers, chills, vomiting, abdominal pain, diarrhea. Patient states she is very stressed right now regarding her living situation and her disability pending this month. PERC negative.   Patient is a 52 y.o. female presenting with chest pain and anxiety.  Chest Pain Associated symptoms: anxiety and nausea   Associated symptoms: no abdominal pain, no fever and not vomiting   Anxiety Associated symptoms include chest pain and nausea. Pertinent negatives include no abdominal pain, chills, fever or vomiting.    Past Medical History  Diagnosis Date  . GERD (gastroesophageal reflux disease)   . Thyroid nodule   . Anxiety 11/11/11  . Asthma   . Herpes   . Seasonal allergies   . Arthritis   . Depression   . Fibromyalgia   . IBS (irritable bowel syndrome)   . DDD (degenerative disc disease)   . Bursitis     right shoulder  . Heart murmur     as a child per pt   Past  Surgical History  Procedure Laterality Date  . Hernia repair      Hiatal   . Cholecystectomy    . Tubal ligation  1983   Family History  Problem Relation Age of Onset  . Lung cancer Mother   . Arthritis Mother     Rheumatoid  . Bursitis Mother   . Heart attack Father 81    Died of MI  . Obesity Father   . Fibromyalgia Sister   . Asthma Mother   . Asthma Child    History  Substance Use Topics  . Smoking status: Current Every Day Smoker -- 0.25 packs/day for 27 years    Types: Cigarettes  . Smokeless tobacco: Never Used  . Alcohol Use: No   OB History   Grav Para Term Preterm Abortions TAB SAB Ect Mult Living                 Review of Systems  Constitutional: Negative for fever and chills.  HENT: Negative.   Eyes: Negative.   Respiratory: Positive for chest tightness.   Cardiovascular: Positive for chest pain.  Gastrointestinal: Positive for nausea. Negative for vomiting, abdominal pain and diarrhea.  Genitourinary: Negative.   Musculoskeletal: Negative.   Skin: Negative.   Psychiatric/Behavioral: The patient is nervous/anxious.     Allergies  Shellfish allergy; Sulfamethoxazole; and Codeine  Home Medications   Current Outpatient Rx  Name  Route  Sig  Dispense  Refill  . albuterol (PROVENTIL HFA;VENTOLIN HFA) 108 (90 BASE) MCG/ACT  inhaler   Inhalation   Inhale 2 puffs into the lungs every 6 (six) hours as needed. Shortness of breath         . albuterol (PROVENTIL) (2.5 MG/3ML) 0.083% nebulizer solution   Nebulization   Take 2.5 mg by nebulization every 6 (six) hours as needed. Shortness of breath         . budesonide-formoterol (SYMBICORT) 160-4.5 MCG/ACT inhaler      Take 2 puffs first thing in am and then another 2 puffs about 12 hours later.         . fluticasone (FLONASE) 50 MCG/ACT nasal spray   Nasal   Place 2 sprays into the nose 2 (two) times daily.         Marland Kitchen HYDROcodone-acetaminophen (NORCO/VICODIN) 5-325 MG per tablet   Oral   Take  0.5 tablets by mouth at bedtime.          Marland Kitchen LORazepam (ATIVAN) 1 MG tablet   Oral   Take 0.5 mg by mouth every 8 (eight) hours as needed (anxiety).          . pantoprazole (PROTONIX) 20 MG tablet   Oral   Take 2 tablets (40 mg total) by mouth 2 (two) times daily.   60 tablet   1   . Simethicone 80 MG TABS   Oral   Take 80 mg by mouth 2 (two) times daily as needed (gas/ heartburn).           BP 117/92  Pulse 80  Temp(Src) 97.6 F (36.4 C) (Oral)  Resp 25  SpO2 100% Physical Exam  Constitutional: She is oriented to person, place, and time. She appears well-developed and well-nourished.  HENT:  Head: Normocephalic and atraumatic.  Mouth/Throat: Oropharynx is clear and moist.  Eyes: Conjunctivae are normal.  Neck: Neck supple.  Cardiovascular: Normal rate, regular rhythm, normal heart sounds and intact distal pulses.   Pulmonary/Chest: Effort normal and breath sounds normal.  Abdominal: Soft. Bowel sounds are normal. There is no tenderness. There is no rebound and no guarding.  Musculoskeletal: She exhibits no edema.  Neurological: She is alert and oriented to person, place, and time.  Skin: Skin is warm and dry. She is not diaphoretic. No erythema.  Psychiatric: Her mood appears anxious.    ED Course   Procedures (including critical care time)  Medications  LORazepam (ATIVAN) injection 1 mg (1 mg Intravenous Given 06/25/13 0921)  sodium chloride 0.9 % bolus 1,000 mL (1,000 mLs Intravenous New Bag/Given 06/25/13 0921)  potassium chloride SA (K-DUR,KLOR-CON) CR tablet 40 mEq (40 mEq Oral Given 06/25/13 1003)  gi cocktail (Maalox,Lidocaine,Donnatal) (30 mLs Oral Given 06/25/13 1003)     Date: 06/25/2013  Rate: 77  Rhythm: normal sinus rhythm  QRS Axis: borderline right axis deviation  Intervals: normal  ST/T Wave abnormalities: normal  Conduction Disutrbances:none  Narrative Interpretation:   Old EKG Reviewed: unchanged    Labs Reviewed  CBC - Abnormal;  Notable for the following:    WBC 11.3 (*)    RBC 5.20 (*)    Hemoglobin 16.6 (*)    HCT 48.7 (*)    All other components within normal limits  BASIC METABOLIC PANEL - Abnormal; Notable for the following:    Potassium 3.3 (*)    Glucose, Bld 101 (*)    All other components within normal limits  PRO B NATRIURETIC PEPTIDE  POCT I-STAT TROPONIN I  POCT I-STAT TROPONIN I   Dg Chest 2 View  06/25/2013   *  RADIOLOGY REPORT*  Clinical Data: Upper chest pain, history of COPD  CHEST - 2 VIEW  Comparison: 02/17/2013  Findings: Cardiomediastinal silhouette is stable.  No acute infiltrate or pulmonary edema.  Again noted hyperinflation and chronic interstitial prominence.  Bony thorax is stable.  IMPRESSION: No active disease.  Stable hyperinflation and chronic interstitial prominence.   Original Report Authenticated By: Natasha Mead, M.D.   1. Chest pain, non-cardiac   2. Anxiety     MDM  Patient is to be discharged with recommendation to follow up with PCP in regards to today's hospital visit. Chest pain is not likely of cardiac or pulmonary etiology d/t presentation, perc negative, VSS, no tracheal deviation, no JVD or new murmur, RRR, breath sounds equal bilaterally, EKG without acute abnormalities, negative troponins, and negative CXR. Pt has been advised to continue to take Ativan as prescribed for anxiety, as CP likely related to increased anxiety. Advised to return to the ED is CP becomes exertional, associated with diaphoresis or nausea, radiates to left jaw/arm, worsens or becomes concerning in any way. Advised f/u with her cardiologist and PCP. Patient is agreeable to plan. Pt appears reliable for follow up and is agreeable to discharge. Case has been discussed with and seen by Dr. Silverio Lay who agrees with the above plan to discharge.        Jeannetta Ellis, PA-C 06/25/13 1539

## 2013-06-29 NOTE — ED Provider Notes (Signed)
Medical screening examination/treatment/procedure(s) were performed by non-physician practitioner and as supervising physician I was immediately available for consultation/collaboration.   Sharilynn Cassity H Cuma Polyakov, MD 06/29/13 1100 

## 2013-07-17 ENCOUNTER — Encounter (HOSPITAL_COMMUNITY): Payer: Self-pay | Admitting: *Deleted

## 2013-07-17 ENCOUNTER — Emergency Department (HOSPITAL_COMMUNITY): Payer: Medicaid Other

## 2013-07-17 ENCOUNTER — Emergency Department (HOSPITAL_COMMUNITY)
Admission: EM | Admit: 2013-07-17 | Discharge: 2013-07-17 | Disposition: A | Discharge status: Home / Self Care | Payer: Medicaid Other | Attending: Emergency Medicine | Admitting: Emergency Medicine

## 2013-07-17 DIAGNOSIS — J45909 Unspecified asthma, uncomplicated: Secondary | ICD-10-CM | POA: Insufficient documentation

## 2013-07-17 DIAGNOSIS — F3289 Other specified depressive episodes: Secondary | ICD-10-CM | POA: Insufficient documentation

## 2013-07-17 DIAGNOSIS — M129 Arthropathy, unspecified: Secondary | ICD-10-CM | POA: Insufficient documentation

## 2013-07-17 DIAGNOSIS — Z8619 Personal history of other infectious and parasitic diseases: Secondary | ICD-10-CM | POA: Insufficient documentation

## 2013-07-17 DIAGNOSIS — Z8719 Personal history of other diseases of the digestive system: Secondary | ICD-10-CM | POA: Insufficient documentation

## 2013-07-17 DIAGNOSIS — Z862 Personal history of diseases of the blood and blood-forming organs and certain disorders involving the immune mechanism: Secondary | ICD-10-CM | POA: Insufficient documentation

## 2013-07-17 DIAGNOSIS — Z8739 Personal history of other diseases of the musculoskeletal system and connective tissue: Secondary | ICD-10-CM | POA: Insufficient documentation

## 2013-07-17 DIAGNOSIS — K219 Gastro-esophageal reflux disease without esophagitis: Secondary | ICD-10-CM | POA: Insufficient documentation

## 2013-07-17 DIAGNOSIS — Z79899 Other long term (current) drug therapy: Secondary | ICD-10-CM | POA: Insufficient documentation

## 2013-07-17 DIAGNOSIS — F411 Generalized anxiety disorder: Secondary | ICD-10-CM | POA: Insufficient documentation

## 2013-07-17 DIAGNOSIS — M25519 Pain in unspecified shoulder: Secondary | ICD-10-CM | POA: Insufficient documentation

## 2013-07-17 DIAGNOSIS — IMO0002 Reserved for concepts with insufficient information to code with codable children: Secondary | ICD-10-CM | POA: Insufficient documentation

## 2013-07-17 DIAGNOSIS — M25512 Pain in left shoulder: Secondary | ICD-10-CM

## 2013-07-17 DIAGNOSIS — F17200 Nicotine dependence, unspecified, uncomplicated: Secondary | ICD-10-CM | POA: Insufficient documentation

## 2013-07-17 DIAGNOSIS — Z8639 Personal history of other endocrine, nutritional and metabolic disease: Secondary | ICD-10-CM | POA: Insufficient documentation

## 2013-07-17 DIAGNOSIS — R011 Cardiac murmur, unspecified: Secondary | ICD-10-CM | POA: Insufficient documentation

## 2013-07-17 DIAGNOSIS — F329 Major depressive disorder, single episode, unspecified: Secondary | ICD-10-CM | POA: Insufficient documentation

## 2013-07-17 LAB — CBC WITH DIFFERENTIAL/PLATELET
Basophils Absolute: 0.1 10*3/uL (ref 0.0–0.1)
Basophils Relative: 1 % (ref 0–1)
Eosinophils Absolute: 0.1 10*3/uL (ref 0.0–0.7)
Eosinophils Relative: 2 % (ref 0–5)
HCT: 44.3 % (ref 36.0–46.0)
Hemoglobin: 15.7 g/dL — ABNORMAL HIGH (ref 12.0–15.0)
Lymphocytes Relative: 47 % — ABNORMAL HIGH (ref 12–46)
Lymphs Abs: 4.3 10*3/uL — ABNORMAL HIGH (ref 0.7–4.0)
MCH: 33 pg (ref 26.0–34.0)
MCHC: 35.4 g/dL (ref 30.0–36.0)
MCV: 93.1 fL (ref 78.0–100.0)
Monocytes Absolute: 0.6 10*3/uL (ref 0.1–1.0)
Monocytes Relative: 7 % (ref 3–12)
Neutro Abs: 4 10*3/uL (ref 1.7–7.7)
Neutrophils Relative %: 44 % (ref 43–77)
Platelets: 242 10*3/uL (ref 150–400)
RBC: 4.76 MIL/uL (ref 3.87–5.11)
RDW: 13.7 % (ref 11.5–15.5)
WBC: 9.2 10*3/uL (ref 4.0–10.5)

## 2013-07-17 LAB — BASIC METABOLIC PANEL
BUN: 12 mg/dL (ref 6–23)
CO2: 24 mEq/L (ref 19–32)
Calcium: 9.4 mg/dL (ref 8.4–10.5)
Chloride: 103 mEq/L (ref 96–112)
Creatinine, Ser: 0.56 mg/dL (ref 0.50–1.10)
GFR calc Af Amer: >90 mL/min (ref >90)
GFR calc non Af Amer: >90 mL/min (ref >90)
Glucose, Bld: 90 mg/dL (ref 70–99)
Potassium: 3.8 mEq/L (ref 3.5–5.1)
Sodium: 137 mEq/L (ref 135–145)

## 2013-07-17 MED ORDER — OXYCODONE-ACETAMINOPHEN 5-325 MG PO TABS: 1.0000 | ORAL_TABLET | ORAL | Status: DC | PRN

## 2013-07-17 MED ORDER — GI COCKTAIL ~~LOC~~
30.0000 mL | Freq: Once | ORAL | Status: AC
  Administered 2013-07-17: 30 mL via ORAL
  Filled 2013-07-17: qty 30

## 2013-07-17 MED ORDER — MORPHINE SULFATE 4 MG/ML IJ SOLN
6.0000 mg | Freq: Once | INTRAMUSCULAR | Status: AC
  Administered 2013-07-17: 6 mg via INTRAVENOUS
  Filled 2013-07-17: qty 2

## 2013-07-17 MED ORDER — KETOROLAC TROMETHAMINE 30 MG/ML IJ SOLN
30.0000 mg | Freq: Once | INTRAMUSCULAR | Status: AC
  Administered 2013-07-17: 30 mg via INTRAVENOUS
  Filled 2013-07-17: qty 1

## 2013-07-17 NOTE — ED Provider Notes (Signed)
CSN: [id number]     Arrival date & time 07/17/13  1741 History     First MD Initiated Contact with Patient 07/17/13 1754     Chief Complaint  Patient presents with  . Shoulder Pain   (Consider location/radiation/quality/duration/timing/severity/associated sxs/prior Treatment) The history is provided by the patient.   patient reports a known rotator cuff injury for which she is following with orthopedic surgery.  She reports over the past 24-36 hours she's developed some numbness and tingling in that left arm and today feels like her left arm is slightly weaker than her right.  She feels like she is having difficulty moving it but she is also having increasing pain in her left shoulder and left trapezius region.  She has mild neck discomfort as well.  No recent fall or trauma.  No chest pain or shortness of breath.  No history of stroke.  The symptoms have been gradually worsening over the past 24 hours.  Her hydrocodone at home is not improving her pain.  She denies numbness weakness or tingling of her lower extremities.  No speech or facial abnormalities.  No altered mental status.  Headache.  No head injury.  She reports she has a history of chronic mid back pain for which she seen an orthopedic surgeon for her as well.  She has a history of fibromyalgia, gastroesophageal reflux disease, asthma, depression, anxiety, IBS  Past Medical History  Diagnosis Date  . GERD (gastroesophageal reflux disease)   . Thyroid nodule   . Anxiety 11/11/11  . Asthma   . Herpes   . Seasonal allergies   . Arthritis   . Depression   . Fibromyalgia   . IBS (irritable bowel syndrome)   . DDD (degenerative disc disease)   . Bursitis     right shoulder  . Heart murmur     as a child per pt   Past Surgical History  Procedure Laterality Date  . Hernia repair      Hiatal   . Cholecystectomy    . Tubal ligation  1983   Family History  Problem Relation Age of Onset  . Lung cancer Mother   . Arthritis  Mother     Rheumatoid  . Bursitis Mother   . Heart attack Father 62    Died of MI  . Obesity Father   . Fibromyalgia Sister   . Asthma Mother   . Asthma Child    History  Substance Use Topics  . Smoking status: Current Every Day Smoker -- 0.25 packs/day for 27 years    Types: Cigarettes  . Smokeless tobacco: Never Used  . Alcohol Use: No   OB History   Grav Para Term Preterm Abortions TAB SAB Ect Mult Living                 Review of Systems  All other systems reviewed and are negative.    Allergies  Shellfish allergy; Sulfamethoxazole; and Codeine  Home Medications   Current Outpatient Rx  Name  Route  Sig  Dispense  Refill  . albuterol (PROVENTIL HFA;VENTOLIN HFA) 108 (90 BASE) MCG/ACT inhaler   Inhalation   Inhale 2 puffs into the lungs every 6 (six) hours as needed. Shortness of breath         . albuterol (PROVENTIL) (2.5 MG/3ML) 0.083% nebulizer solution   Nebulization   Take 2.5 mg by nebulization every 6 (six) hours as needed. Shortness of breath         .  budesonide-formoterol (SYMBICORT) 160-4.5 MCG/ACT inhaler      Take 2 puffs first thing in am and then another 2 puffs about 12 hours later.         Marland Kitchen HYDROcodone-acetaminophen (NORCO/VICODIN) 5-325 MG per tablet   Oral   Take 0.5 tablets by mouth at bedtime.          Marland Kitchen ibuprofen (ADVIL,MOTRIN) 200 MG tablet   Oral   Take 400 mg by mouth every 6 (six) hours as needed for pain.         Marland Kitchen LORazepam (ATIVAN) 1 MG tablet   Oral   Take 0.5 mg by mouth every 8 (eight) hours as needed (anxiety).          . pantoprazole (PROTONIX) 20 MG tablet   Oral   Take 2 tablets (40 mg total) by mouth 2 (two) times daily.   60 tablet   1   . Simethicone 80 MG TABS   Oral   Take 80 mg by mouth 2 (two) times daily as needed (gas/ heartburn).          Marland Kitchen oxyCODONE-acetaminophen (PERCOCET/ROXICET) 5-325 MG per tablet   Oral   Take 1 tablet by mouth every 4 (four) hours as needed for pain.   20  tablet   0    BP 133/91  Pulse 71  Temp(Src) 98 F (36.7 C)  Resp 18  SpO2 96% Physical Exam  Nursing note and vitals reviewed. Constitutional: She is oriented to person, place, and time. She appears well-developed and well-nourished. No distress.  HENT:  Head: Normocephalic and atraumatic.  Eyes: EOM are normal.  Neck: Normal range of motion.  Cardiovascular: Normal rate, regular rhythm and normal heart sounds.   Pulmonary/Chest: Effort normal and breath sounds normal.  Abdominal: Soft. She exhibits no distension. There is no tenderness.  Musculoskeletal: Normal range of motion.  Limited range of motion of left shoulder given pain and discomfort.  No erythema or swelling or deformity about her left shoulder.  Normal left radial pulse.  Good grip strength of left hand.  There is evidence of some left arm weakness as compared to right but is difficult to assess how much of this is secondary to pain.  Difficult to assess quality of effort  Neurological: She is alert and oriented to person, place, and time.  Skin: Skin is warm and dry.  Psychiatric: She has a normal mood and affect. Judgment normal.    ED Course   Procedures (including critical care time)  Labs Reviewed  CBC WITH DIFFERENTIAL - Abnormal; Notable for the following:    Hemoglobin 15.7 (*)    Lymphocytes Relative 47 (*)    Lymphs Abs 4.3 (*)    All other components within normal limits  BASIC METABOLIC PANEL   Mr Cervical Spine Wo Contrast  07/17/2013   *RADIOLOGY REPORT*  Clinical Data: Left arm weakness.  MRI CERVICAL SPINE WITHOUT CONTRAST  Technique:  Multiplanar and multiecho pulse sequences of the cervical spine, to include the craniocervical junction and cervicothoracic junction, were obtained according to standard protocol without intravenous contrast.  Comparison: Cervical spine plain films 07/16/2005.  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  2 mm anterolisthesis C5 on C6.  Mild disc  space narrowing C5-C6.  No compression fracture or focal marrow abnormality.  No prevertebral soft tissue swelling.  Normal cord size and signal throughout.  The individual disc spaces were examined as follows:  C2-3:  Rudimentary disc space.  C3-4:  Asymmetric facet arthropathy and uncinate spurring on the left.  Significant left C4 nerve root impingement.  C4-5:  Similar, but less severe, left-sided facet arthropathy uncinate spurring.  Left C5 nerve root impingement.  C5-6:  2 mm facet mediated anterolisthesis.  Left greater than right facet arthropathy.  Central and leftward protrusion. Equivocal left C6 nerve root impingement.  C6-7:  Normal.  C7-T1:  Normal.  Compared with plain films, degenerative change has progressed.  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  2 mm facet mediated anterolisthesis C5-6 with a shallow central leftward protrusion; equivocal left C6 nerve root impingement  Asymmetric bony overgrowth at C3-4 and C4-5 on the left narrowing the foramen resulting in probable left C4 and left C5 nerve root impingement.  None of these changes are acute.   Original Report Authenticated By: Davonna Belling, M.D.   I personally reviewed the imaging tests through PACS system I reviewed available ER/hospitalization records through the EMR   1. Left shoulder pain     MDM  Patient with a known left rotator cuff tear on MRI.  Cervical spine with some C4-5 nerve root impingement.  She has an orthopedic surgeon and has followup scheduled for 5 days from now.  Home with pain medicine and sling.  No indication for acute treatment today.  Doubt stroke.  Lyanne Co, MD 07/17/13 2224

## 2013-07-17 NOTE — ED Notes (Signed)
Alanson Puls (friend). [phone].  Please call if anything major happens.

## 2013-07-17 NOTE — ED Notes (Signed)
Pt currently in MRI

## 2013-07-17 NOTE — ED Notes (Signed)
Pt assisted to restroom. Requesting ice chips at this time.

## 2013-07-17 NOTE — ED Notes (Signed)
Pt reports hx of rotator cuff injury.  States that today, her (L) arm went numb and she was unable to move it.

## 2013-10-30 DIAGNOSIS — J441 Chronic obstructive pulmonary disease with (acute) exacerbation: Secondary | ICD-10-CM | POA: Insufficient documentation

## 2013-12-29 DIAGNOSIS — N950 Postmenopausal bleeding: Secondary | ICD-10-CM | POA: Insufficient documentation

## 2014-01-08 ENCOUNTER — Encounter: Payer: Self-pay | Admitting: Obstetrics

## 2014-01-20 ENCOUNTER — Ambulatory Visit: Payer: Self-pay | Admitting: Obstetrics

## 2014-04-03 DIAGNOSIS — R52 Pain, unspecified: Secondary | ICD-10-CM | POA: Insufficient documentation

## 2014-07-31 ENCOUNTER — Emergency Department (HOSPITAL_COMMUNITY): Payer: Medicaid Other

## 2014-07-31 ENCOUNTER — Encounter (HOSPITAL_COMMUNITY): Payer: Self-pay | Admitting: Emergency Medicine

## 2014-07-31 ENCOUNTER — Emergency Department (HOSPITAL_COMMUNITY)
Admission: EM | Admit: 2014-07-31 | Discharge: 2014-07-31 | Disposition: A | Discharge status: Home / Self Care | Payer: Medicaid Other | Attending: Emergency Medicine | Admitting: Emergency Medicine

## 2014-07-31 DIAGNOSIS — R011 Cardiac murmur, unspecified: Secondary | ICD-10-CM | POA: Diagnosis not present

## 2014-07-31 DIAGNOSIS — M129 Arthropathy, unspecified: Secondary | ICD-10-CM | POA: Insufficient documentation

## 2014-07-31 DIAGNOSIS — R071 Chest pain on breathing: Secondary | ICD-10-CM | POA: Insufficient documentation

## 2014-07-31 DIAGNOSIS — Z8639 Personal history of other endocrine, nutritional and metabolic disease: Secondary | ICD-10-CM | POA: Insufficient documentation

## 2014-07-31 DIAGNOSIS — R079 Chest pain, unspecified: Secondary | ICD-10-CM | POA: Insufficient documentation

## 2014-07-31 DIAGNOSIS — Z8619 Personal history of other infectious and parasitic diseases: Secondary | ICD-10-CM | POA: Insufficient documentation

## 2014-07-31 DIAGNOSIS — Z862 Personal history of diseases of the blood and blood-forming organs and certain disorders involving the immune mechanism: Secondary | ICD-10-CM | POA: Insufficient documentation

## 2014-07-31 DIAGNOSIS — F17200 Nicotine dependence, unspecified, uncomplicated: Secondary | ICD-10-CM | POA: Diagnosis not present

## 2014-07-31 DIAGNOSIS — J069 Acute upper respiratory infection, unspecified: Secondary | ICD-10-CM | POA: Diagnosis not present

## 2014-07-31 DIAGNOSIS — Z8739 Personal history of other diseases of the musculoskeletal system and connective tissue: Secondary | ICD-10-CM | POA: Diagnosis not present

## 2014-07-31 DIAGNOSIS — K219 Gastro-esophageal reflux disease without esophagitis: Secondary | ICD-10-CM | POA: Diagnosis not present

## 2014-07-31 DIAGNOSIS — F411 Generalized anxiety disorder: Secondary | ICD-10-CM | POA: Insufficient documentation

## 2014-07-31 DIAGNOSIS — J441 Chronic obstructive pulmonary disease with (acute) exacerbation: Secondary | ICD-10-CM | POA: Diagnosis not present

## 2014-07-31 DIAGNOSIS — R0789 Other chest pain: Secondary | ICD-10-CM

## 2014-07-31 DIAGNOSIS — J45901 Unspecified asthma with (acute) exacerbation: Secondary | ICD-10-CM | POA: Insufficient documentation

## 2014-07-31 DIAGNOSIS — Z79899 Other long term (current) drug therapy: Secondary | ICD-10-CM | POA: Insufficient documentation

## 2014-07-31 HISTORY — DX: Chronic obstructive pulmonary disease, unspecified: J44.9

## 2014-07-31 LAB — CBC
HCT: 43.2 % (ref 36.0–46.0)
Hemoglobin: 14.9 g/dL (ref 12.0–15.0)
MCH: 31.4 pg (ref 26.0–34.0)
MCHC: 34.5 g/dL (ref 30.0–36.0)
MCV: 91.1 fL (ref 78.0–100.0)
Platelets: 256 10*3/uL (ref 150–400)
RBC: 4.74 MIL/uL (ref 3.87–5.11)
RDW: 13.8 % (ref 11.5–15.5)
WBC: 7.6 10*3/uL (ref 4.0–10.5)

## 2014-07-31 LAB — BASIC METABOLIC PANEL
Anion gap: 14 (ref 5–15)
BUN: 9 mg/dL (ref 6–23)
CO2: 22 mEq/L (ref 19–32)
Calcium: 9.4 mg/dL (ref 8.4–10.5)
Chloride: 103 mEq/L (ref 96–112)
Creatinine, Ser: 0.43 mg/dL — ABNORMAL LOW (ref 0.50–1.10)
GFR calc Af Amer: >90 mL/min (ref >90)
GFR calc non Af Amer: >90 mL/min (ref >90)
Glucose, Bld: 107 mg/dL — ABNORMAL HIGH (ref 70–99)
Potassium: 3.6 mEq/L — ABNORMAL LOW (ref 3.7–5.3)
Sodium: 139 mEq/L (ref 137–147)

## 2014-07-31 LAB — D-DIMER, QUANTITATIVE (NOT AT ARMC): D-Dimer, Quant: 1.18 ug/mL-FEU — ABNORMAL HIGH (ref 0.00–0.48)

## 2014-07-31 LAB — I-STAT TROPONIN, ED
Troponin i, poc: 0 ng/mL (ref 0.00–0.08)
Troponin i, poc: 0 ng/mL (ref 0.00–0.08)

## 2014-07-31 MED ORDER — METHYLPREDNISOLONE SODIUM SUCC 125 MG IJ SOLR
125.0000 mg | Freq: Once | INTRAMUSCULAR | Status: AC
  Administered 2014-07-31: 125 mg via INTRAVENOUS
  Filled 2014-07-31: qty 2

## 2014-07-31 MED ORDER — KETOROLAC TROMETHAMINE 30 MG/ML IJ SOLN
30.0000 mg | Freq: Once | INTRAMUSCULAR | Status: AC
  Administered 2014-07-31: 30 mg via INTRAVENOUS
  Filled 2014-07-31: qty 1

## 2014-07-31 MED ORDER — SODIUM CHLORIDE 0.9 % IV BOLUS (SEPSIS)
1000.0000 mL | Freq: Once | INTRAVENOUS | Status: AC
  Administered 2014-07-31: 1000 mL via INTRAVENOUS

## 2014-07-31 MED ORDER — CYCLOBENZAPRINE HCL 5 MG PO TABS: 5.0000 mg | ORAL_TABLET | Freq: Three times a day (TID) | ORAL | Status: DC | PRN

## 2014-07-31 MED ORDER — IOHEXOL 350 MG/ML SOLN
80.0000 mL | Freq: Once | INTRAVENOUS | Status: AC | PRN
  Administered 2014-07-31: 80 mL via INTRAVENOUS

## 2014-07-31 MED ORDER — IPRATROPIUM-ALBUTEROL 0.5-2.5 (3) MG/3ML IN SOLN
3.0000 mL | Freq: Once | RESPIRATORY_TRACT | Status: AC
Start: 2014-07-31 — End: 2014-07-31
  Administered 2014-07-31: 3 mL via RESPIRATORY_TRACT
  Filled 2014-07-31: qty 3

## 2014-07-31 NOTE — ED Provider Notes (Addendum)
TIME SEEN: 3:14 PM  CHIEF COMPLAINT: Chest pain, shortness of breath  HPI: Patient is a 53 y.o. F history of COPD with continued tobacco use who presents to the emergency department with complaints of chest pain shortness of breath. She states that one week ago she started having fevers, chills, nonproductive cough. She was seen by her primary care physician at [facility] on Monday, 5 days ago. She was started on albuterol, prednisone and Augmentin. She reports she was feeling better. She states she woke up today at 9 AM with tight central chest pain and a sharp pain that radiated into her right neck and shoulder. She states that she did feel short of breath. She states her pain is worse with deep inspiration and palpation of her chest. Her shortness of breath is worse with exertion. No associated lightheadedness, diaphoresis, nausea or vomiting. She states that today she started coughing up white phlegm. She states she's never had a history of coronary artery disease, hypertension, diabetes, hyperlipidemia. She has never had a stress test. She states she did have a cardiac catheterization in 2005 which was normal and does not have stents. Denies a history of PE or DVT. No lower extremity swelling or pain.  ROS: See HPI Constitutional: no fever  Eyes: no drainage  ENT: no runny nose   Cardiovascular:   chest pain  Resp:  SOB  GI: no vomiting GU: no dysuria Integumentary: no rash  Allergy: no hives  Musculoskeletal: no leg swelling  Neurological: no slurred speech ROS otherwise negative  PAST MEDICAL HISTORY/PAST SURGICAL HISTORY:  Past Medical History  Diagnosis Date  . GERD (gastroesophageal reflux disease)   . Thyroid nodule   . Anxiety 11/11/11  . Asthma   . Herpes   . Seasonal allergies   . Arthritis   . Depression   . Fibromyalgia   . IBS (irritable bowel syndrome)   . DDD (degenerative disc disease)   . Bursitis     right shoulder  . Heart murmur     as a  child per pt  . COPD (chronic obstructive pulmonary disease)     MEDICATIONS:  Prior to Admission medications   Medication Sig Start Date End Date Taking? Authorizing Provider  albuterol (PROVENTIL HFA;VENTOLIN HFA) 108 (90 BASE) MCG/ACT inhaler Inhale 2 puffs into the lungs every 6 (six) hours as needed. Shortness of breath    Historical Provider, MD  albuterol (PROVENTIL) (2.5 MG/3ML) 0.083% nebulizer solution Take 2.5 mg by nebulization every 6 (six) hours as needed. Shortness of breath    Historical Provider, MD  budesonide-formoterol (SYMBICORT) 160-4.5 MCG/ACT inhaler Take 2 puffs first thing in am and then another 2 puffs about 12 hours later. 03/12/13   Tanda Rockers, MD  HYDROcodone-acetaminophen (NORCO/VICODIN) 5-325 MG per tablet Take 0.5 tablets by mouth at bedtime.     Historical Provider, MD  ibuprofen (ADVIL,MOTRIN) 200 MG tablet Take 400 mg by mouth every 6 (six) hours as needed for pain.    Historical Provider, MD  LORazepam (ATIVAN) 1 MG tablet Take 0.5 mg by mouth every 8 (eight) hours as needed (anxiety).  03/16/11   Alycia Rossetti, MD  oxyCODONE-acetaminophen (PERCOCET/ROXICET) 5-325 MG per tablet Take 1 tablet by mouth every 4 (four) hours as needed for pain. 07/17/13   Hoy Morn, MD  pantoprazole (PROTONIX) 20 MG tablet Take 2 tablets (40 mg total) by mouth 2 (two) times daily. 04/07/13   Janett Billow D. Zehr, PA-C  Simethicone 80 MG  TABS Take 80 mg by mouth 2 (two) times daily as needed (gas/ heartburn).     Historical Provider, MD    ALLERGIES:  Allergies  Allergen Reactions  . 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 Sulfamethoxazole Anaphylaxis  . Codeine Anxiety    Sweating,nervous    SOCIAL HISTORY:  History  Substance Use Topics  . Smoking status: Current Every Day Smoker -- 0.25 packs/day for 27 years    Types: Cigarettes  . Smokeless tobacco: Never Used  . Alcohol Use: No    FAMILY HISTORY: Family History  Problem Relation Age of Onset  .  Lung cancer Mother   . Arthritis Mother     Rheumatoid  . Bursitis Mother   . Heart attack Father 55    Died of MI  . Obesity Father   . Fibromyalgia Sister   . Asthma Mother   . Asthma Child     EXAM: BP 115/68  Pulse 64  Temp(Src) 97.6 F (36.4 C) (Oral)  Resp 19  Ht 5\' 3"  (1.6 m)  Wt 144 lb (65.318 kg)  BMI 25.51 kg/m2  SpO2 96% CONSTITUTIONAL: Alert and oriented and responds appropriately to questions. Well-appearing; well-nourished HEAD: Normocephalic EYES: Conjunctivae clear, PERRL ENT: normal nose; no rhinorrhea; moist mucous membranes; pharynx without lesions noted NECK: Supple, no meningismus, no LAD  CARD: RRR; S1 and S2 appreciated; no murmurs, no clicks, no rubs, no gallops RESP: Normal chest excursion without splinting or tachypnea; breath sounds equal bilaterally with minimal expiratory wheezing, no rhonchi or rales, patient is diminished at her bases bilaterally, oxygen saturation 91-93% on room air, no respiratory distress, increased work of breathing, speaking full sentences, chest wall is tender to palpation in the Center of the chest without crepitus or ecchymosis or deformity ABD/GI: Normal bowel sounds; non-distended; soft, non-tender, no rebound, no guarding BACK:  The back appears normal and is non-tender to palpation, there is no CVA tenderness EXT: Normal ROM in all joints; non-tender to palpation; no edema; normal capillary refill; no cyanosis    SKIN: Normal color for age and race; warm NEURO: Moves all extremities equally, no slurred speech or facial droop PSYCH: The patient's mood and manner are appropriate. Grooming and personal hygiene are appropriate.  MEDICAL DECISION MAKING: Patient here with chest pain. Differential diagnosis includes COPD, PE, ACS, chest wall pain, pneumonia. We'll obtain cardiac labs including troponin and d-dimer. Will obtain chest x-ray. We'll give Solu-Medrol and DuoNeb. EKG shows no ischemic changes. Patient refusing pain  medication after multiple attempts to give her something to control her pain.  ED PROGRESS: Patient's troponin is negative. Her d-dimer is elevated. Chest x-ray shows no edema, infiltrate, pneumothorax. We'll obtain a CT of her chest to rule out pulmonary embolus.   5:43 PM  Pt's CT scan shows no pulmonary embolus, dissection, infiltrate or edema. She does have emphysema. Suspect her pain is secondary to chest wall pain. Will repeat second troponin. She is now agreeing to take Toradol for pain control.   7:19 PM  Pt's second troponin is negative. She states she feel she is ready for discharge home. She has Vicodin at home. We'll also discharge with prescription for Flexeril as I think this is musculoskeletal in nature secondary to her heavy coughing from her URI. Have discussed with her strict return precautions and supportive care instructions. She verbalized understanding and is comfortable with this plan. We'll have her continue her prednisone. She reports she has Vicodin at home for pain.  EKG Interpretation  Date/Time:  Saturday July 31 2014 14:40:56 EDT Ventricular Rate:  67 PR Interval:  121 QRS Duration: 102 QT Interval:  439 QTC Calculation: 463 R Axis:   82 Text Interpretation:  Sinus rhythm No significant change since last tracing Confirmed by WARD,  DO, KRISTEN [phone]) on 07/31/2014 3:22:15 PM        Delice Bison Ward, DO 07/31/14 [place], DO 07/31/14 1921

## 2014-07-31 NOTE — ED Notes (Signed)
Pt reports to the ED from home for eval of sharp and tightness in her lower sternal to epigastric area. Pt reports it feels sharp and like an elephant is sitting on her chest. She is unable to take deep breath. Pt reports that when she touches her chest or takes a deep breath the pain increases in severity and it radiates into the right side of her neck. Pt was dx with bronchitis on Monday and has been taking Prednisone and Amoxicillin. Pt denies any diaphoresis, N/V, lightheadedness, or dizziness. Pt reports she has been taking her nebulizer txs but they have been inefficient because she cannot take deep breaths. 12 lead shows NSR. Pt A&Ox4, resp e/u, and skin warm, pink, and dry.

## 2014-07-31 NOTE — Discharge Instructions (Signed)
Chest Wall Pain Chest wall pain is pain in or around the bones and muscles of your chest. It may take up to 6 weeks to get better. It may take longer if you must stay physically active in your work and activities.  CAUSES  Chest wall pain may happen on its own. However, it may be caused by:  A viral illness like the flu.  Injury.  Coughing.  Exercise.  Arthritis.  Fibromyalgia.  Shingles. HOME CARE INSTRUCTIONS   Avoid overtiring physical activity. Try not to strain or perform activities that cause pain. This includes any activities using your chest or your abdominal and side muscles, especially if heavy weights are used.  Put ice on the sore area.  Put ice in a plastic bag.  Place a towel between your skin and the bag.  Leave the ice on for 15-20 minutes per hour while awake for the first 2 days.  Only take over-the-counter or prescription medicines for pain, discomfort, or fever as directed by your caregiver. SEEK IMMEDIATE MEDICAL CARE IF:   Your pain increases, or you are very uncomfortable.  You have a fever.  Your chest pain becomes worse.  You have new, unexplained symptoms.  You have nausea or vomiting.  You feel sweaty or lightheaded.  You have a cough with phlegm (sputum), or you cough up blood. MAKE SURE YOU:   Understand these instructions.  Will watch your condition.  Will get help right away if you are not doing well or get worse. Document Released: 11/12/2005 Document Revised: 02/04/2012 Document Reviewed: 07/09/2011 [facility] Patient Information 2015 [place], [place]. This information is not intended to replace advice given to you by your health care provider. Make sure you discuss any questions you have with your health care provider.  Chronic Obstructive Pulmonary Disease Exacerbation Chronic obstructive pulmonary disease (COPD) is a common lung condition in which airflow from the lungs is limited. COPD is a general term that can be used to  describe many different lung problems that limit airflow, including chronic bronchitis and emphysema. COPD exacerbations are episodes when breathing symptoms become much worse and require extra treatment. Without treatment, COPD exacerbations can be life threatening, and frequent COPD exacerbations can cause further damage to your lungs. CAUSES   Respiratory infections.   Exposure to smoke.   Exposure to air pollution, chemical fumes, or dust. Sometimes there is no apparent cause or trigger. RISK FACTORS  Smoking cigarettes.  Older age.  Frequent prior COPD exacerbations. SIGNS AND SYMPTOMS   Increased coughing.   Increased thick spit (sputum) production.   Increased wheezing.   Increased shortness of breath.   Rapid breathing.   Chest tightness. DIAGNOSIS  Your medical history, a physical exam, and tests will help your health care provider make a diagnosis. Tests may include:  A chest X-ray.  Basic lab tests.  Sputum testing.  An arterial blood gas test. TREATMENT  Depending on the severity of your COPD exacerbation, you may need to be admitted to a hospital for treatment. Some of the treatments commonly used to treat COPD exacerbations are:   Antibiotic medicines.   Bronchodilators. These are drugs that expand the air passages. They may be given with an inhaler or nebulizer. Spacer devices may be needed to help improve drug delivery.  Corticosteroid medicines.  Supplemental oxygen therapy.  HOME CARE INSTRUCTIONS   Do not smoke. Quitting smoking is very important to prevent COPD from getting worse and exacerbations from happening as often.  Avoid exposure to  all substances that irritate the airway, especially to tobacco smoke.   If you were prescribed an antibiotic medicine, finish it all even if you start to feel better.  Take all medicines as directed by your health care provider.It is important to use correct technique with inhaled  medicines.  Drink enough fluids to keep your urine clear or pale yellow (unless you have a medical condition that requires fluid restriction).  Use a cool mist vaporizer. This makes it easier to clear your chest when you cough.   If you have a home nebulizer and oxygen, continue to use them as directed.   Maintain all necessary vaccinations to prevent infections.   Exercise regularly.   Eat a healthy diet.   Keep all follow-up appointments as directed by your health care provider. SEEK IMMEDIATE MEDICAL CARE IF:  You have worsening shortness of breath.   You have trouble talking.   You have severe chest pain.  You have blood in your sputum.  You have a fever.  You have weakness, vomit repeatedly, or faint.   You feel confused.   You continue to get worse. MAKE SURE YOU:   Understand these instructions.  Will watch your condition.  Will get help right away if you are not doing well or get worse. Document Released: 09/09/2007 Document Revised: 03/29/2014 Document Reviewed: 07/17/2013 [facility] Patient Information 2015 [place], [place]. This information is not intended to replace advice given to you by your health care provider. Make sure you discuss any questions you have with your health care provider.  Upper Respiratory Infection, Adult An upper respiratory infection (URI) is also sometimes known as the common cold. The upper respiratory tract includes the nose, sinuses, throat, trachea, and bronchi. Bronchi are the airways leading to the lungs. Most people improve within 1 week, but symptoms can last up to 2 weeks. A residual cough may last even longer.  CAUSES Many different viruses can infect the tissues lining the upper respiratory tract. The tissues become irritated and inflamed and often become very moist. Mucus production is also common. A cold is contagious. You can easily spread the virus to others by oral contact. This includes kissing, sharing a glass,  coughing, or sneezing. Touching your mouth or nose and then touching a surface, which is then touched by another person, can also spread the virus. SYMPTOMS  Symptoms typically develop 1 to 3 days after you come in contact with a cold virus. Symptoms vary from person to person. They may include:  Runny nose.  Sneezing.  Nasal congestion.  Sinus irritation.  Sore throat.  Loss of voice (laryngitis).  Cough.  Fatigue.  Muscle aches.  Loss of appetite.  Headache.  Low-grade fever. DIAGNOSIS  You might diagnose your own cold based on familiar symptoms, since most people get a cold 2 to 3 times a year. Your caregiver can confirm this based on your exam. Most importantly, your caregiver can check that your symptoms are not due to another disease such as strep throat, sinusitis, pneumonia, asthma, or epiglottitis. Blood tests, throat tests, and X-rays are not necessary to diagnose a common cold, but they may sometimes be helpful in excluding other more serious diseases. Your caregiver will decide if any further tests are required. RISKS AND COMPLICATIONS  You may be at risk for a more severe case of the common cold if you smoke cigarettes, have chronic heart disease (such as heart failure) or lung disease (such as asthma), or if you have a weakened immune  system. The very young and very old are also at risk for more serious infections. Bacterial sinusitis, middle ear infections, and bacterial pneumonia can complicate the common cold. The common cold can worsen asthma and chronic obstructive pulmonary disease (COPD). Sometimes, these complications can require emergency medical care and may be life-threatening. PREVENTION  The best way to protect against getting a cold is to practice good hygiene. Avoid oral or hand contact with people with cold symptoms. Wash your hands often if contact occurs. There is no clear evidence that vitamin C, vitamin E, echinacea, or exercise reduces the chance of  developing a cold. However, it is always recommended to get plenty of rest and practice good nutrition. TREATMENT  Treatment is directed at relieving symptoms. There is no cure. Antibiotics are not effective, because the infection is caused by a virus, not by bacteria. Treatment may include:  Increased fluid intake. Sports drinks offer valuable electrolytes, sugars, and fluids.  Breathing heated mist or steam (vaporizer or shower).  Eating chicken soup or other clear broths, and maintaining good nutrition.  Getting plenty of rest.  Using gargles or lozenges for comfort.  Controlling fevers with ibuprofen or acetaminophen as directed by your caregiver.  Increasing usage of your inhaler if you have asthma. Zinc gel and zinc lozenges, taken in the first 24 hours of the common cold, can shorten the duration and lessen the severity of symptoms. Pain medicines may help with fever, muscle aches, and throat pain. A variety of non-prescription medicines are available to treat congestion and runny nose. Your caregiver can make recommendations and may suggest nasal or lung inhalers for other symptoms.  HOME CARE INSTRUCTIONS   Only take over-the-counter or prescription medicines for pain, discomfort, or fever as directed by your caregiver.  Use a warm mist humidifier or inhale steam from a shower to increase air moisture. This may keep secretions moist and make it easier to breathe.  Drink enough water and fluids to keep your urine clear or pale yellow.  Rest as needed.  Return to work when your temperature has returned to normal or as your caregiver advises. You may need to stay home longer to avoid infecting others. You can also use a face mask and careful hand washing to prevent spread of the virus. SEEK MEDICAL CARE IF:   After the first few days, you feel you are getting worse rather than better.  You need your caregiver's advice about medicines to control symptoms.  You develop chills,  worsening shortness of breath, or brown or red sputum. These may be signs of pneumonia.  You develop yellow or brown nasal discharge or pain in the face, especially when you bend forward. These may be signs of sinusitis.  You develop a fever, swollen neck glands, pain with swallowing, or white areas in the back of your throat. These may be signs of strep throat. SEEK IMMEDIATE MEDICAL CARE IF:   You have a fever.  You develop severe or persistent headache, ear pain, sinus pain, or chest pain.  You develop wheezing, a prolonged cough, cough up blood, or have a change in your usual mucus (if you have chronic lung disease).  You develop sore muscles or a stiff neck. Document Released: 05/08/2001 Document Revised: 02/04/2012 Document Reviewed: 02/17/2014 [facility] Patient Information 2015 [place], [place]. This information is not intended to replace advice given to you by your health care provider. Make sure you discuss any questions you have with your health care provider.

## 2015-01-11 ENCOUNTER — Emergency Department (HOSPITAL_COMMUNITY): Payer: Medicaid Other

## 2015-01-11 ENCOUNTER — Encounter (HOSPITAL_COMMUNITY): Payer: Self-pay | Admitting: Emergency Medicine

## 2015-01-11 ENCOUNTER — Emergency Department (HOSPITAL_COMMUNITY)
Admission: EM | Admit: 2015-01-11 | Discharge: 2015-01-11 | Disposition: A | Discharge status: Home / Self Care | Payer: Medicaid Other | Attending: Emergency Medicine | Admitting: Emergency Medicine

## 2015-01-11 DIAGNOSIS — Z7952 Long term (current) use of systemic steroids: Secondary | ICD-10-CM | POA: Insufficient documentation

## 2015-01-11 DIAGNOSIS — S32030A Wedge compression fracture of third lumbar vertebra, initial encounter for closed fracture: Secondary | ICD-10-CM | POA: Diagnosis not present

## 2015-01-11 DIAGNOSIS — Z720 Tobacco use: Secondary | ICD-10-CM | POA: Diagnosis not present

## 2015-01-11 DIAGNOSIS — S8991XA Unspecified injury of right lower leg, initial encounter: Secondary | ICD-10-CM | POA: Diagnosis not present

## 2015-01-11 DIAGNOSIS — K219 Gastro-esophageal reflux disease without esophagitis: Secondary | ICD-10-CM | POA: Insufficient documentation

## 2015-01-11 DIAGNOSIS — Z79899 Other long term (current) drug therapy: Secondary | ICD-10-CM | POA: Insufficient documentation

## 2015-01-11 DIAGNOSIS — M7918 Myalgia, other site: Secondary | ICD-10-CM

## 2015-01-11 DIAGNOSIS — F419 Anxiety disorder, unspecified: Secondary | ICD-10-CM | POA: Diagnosis not present

## 2015-01-11 DIAGNOSIS — Z8619 Personal history of other infectious and parasitic diseases: Secondary | ICD-10-CM | POA: Insufficient documentation

## 2015-01-11 DIAGNOSIS — Y998 Other external cause status: Secondary | ICD-10-CM | POA: Insufficient documentation

## 2015-01-11 DIAGNOSIS — M1990 Unspecified osteoarthritis, unspecified site: Secondary | ICD-10-CM | POA: Insufficient documentation

## 2015-01-11 DIAGNOSIS — S32000A Wedge compression fracture of unspecified lumbar vertebra, initial encounter for closed fracture: Secondary | ICD-10-CM

## 2015-01-11 DIAGNOSIS — M25551 Pain in right hip: Secondary | ICD-10-CM

## 2015-01-11 DIAGNOSIS — R011 Cardiac murmur, unspecified: Secondary | ICD-10-CM | POA: Insufficient documentation

## 2015-01-11 DIAGNOSIS — Y9289 Other specified places as the place of occurrence of the external cause: Secondary | ICD-10-CM | POA: Insufficient documentation

## 2015-01-11 DIAGNOSIS — S79911A Unspecified injury of right hip, initial encounter: Secondary | ICD-10-CM | POA: Insufficient documentation

## 2015-01-11 DIAGNOSIS — S3992XA Unspecified injury of lower back, initial encounter: Secondary | ICD-10-CM | POA: Diagnosis present

## 2015-01-11 DIAGNOSIS — M1611 Unilateral primary osteoarthritis, right hip: Secondary | ICD-10-CM

## 2015-01-11 DIAGNOSIS — Y9389 Activity, other specified: Secondary | ICD-10-CM | POA: Diagnosis not present

## 2015-01-11 DIAGNOSIS — R200 Anesthesia of skin: Secondary | ICD-10-CM | POA: Diagnosis not present

## 2015-01-11 DIAGNOSIS — Z792 Long term (current) use of antibiotics: Secondary | ICD-10-CM | POA: Insufficient documentation

## 2015-01-11 DIAGNOSIS — J449 Chronic obstructive pulmonary disease, unspecified: Secondary | ICD-10-CM | POA: Diagnosis not present

## 2015-01-11 DIAGNOSIS — F329 Major depressive disorder, single episode, unspecified: Secondary | ICD-10-CM | POA: Diagnosis not present

## 2015-01-11 DIAGNOSIS — W009XXA Unspecified fall due to ice and snow, initial encounter: Secondary | ICD-10-CM | POA: Diagnosis not present

## 2015-01-11 MED ORDER — HYDROCODONE-ACETAMINOPHEN 5-325 MG PO TABS: 0.5000 | ORAL_TABLET | Freq: Two times a day (BID) | ORAL | Status: DC | PRN

## 2015-01-11 MED ORDER — HYDROCODONE-ACETAMINOPHEN 5-325 MG PO TABS
1.0000 | ORAL_TABLET | Freq: Once | ORAL | Status: AC
  Administered 2015-01-11: 1 via ORAL
  Filled 2015-01-11: qty 1

## 2015-01-11 MED ORDER — CYCLOBENZAPRINE HCL 5 MG PO TABS: 5.0000 mg | ORAL_TABLET | Freq: Three times a day (TID) | ORAL | Status: DC | PRN

## 2015-01-11 MED ORDER — IBUPROFEN 200 MG PO TABS: 400.0000 mg | ORAL_TABLET | Freq: Four times a day (QID) | ORAL | Status: DC | PRN

## 2015-01-11 MED ORDER — TRAMADOL HCL 50 MG PO TABS: 50.0000 mg | ORAL_TABLET | Freq: Once | ORAL | Status: DC

## 2015-01-11 NOTE — ED Provider Notes (Signed)
CSN: [id number]     Arrival date & time 01/11/15  1310 History  This chart was scribed for non-physician practitioner, Noland Fordyce, PA-C working with Charlesetta Shanks, MD by Frederich Balding, ED scribe. This patient was seen in room TR08C/TR08C and the patient's care was started at 2:54 PM.   Chief Complaint  Patient presents with  . Fall  . Back Pain   The history is provided by the patient. No language interpreter was used.    HPI Comments: Sheri Rivera is a 54 y.o. female with history of chronic lower back pain who presents to the Emergency Department complaining of a fall that occurred last night. Pt slipped on ice and fell onto her buttocks on concrete. She has sudden onset, worsening lower back pain that radiates into her posterior right leg. Pain is constant, aching, 10/10.  Pt reports associated numbness and tingling in her right toes. She has not yet taken any medications. Movements worsen pain. No change in bowel or bladder habits.  Pt's orthopedist is Dr. Erlinda Hong.  Past Medical History  Diagnosis Date  . GERD (gastroesophageal reflux disease)   . Thyroid nodule   . Anxiety 11/11/11  . Asthma   . Herpes   . Seasonal allergies   . Arthritis   . Depression   . Fibromyalgia   . IBS (irritable bowel syndrome)   . DDD (degenerative disc disease)   . Bursitis     right shoulder  . Heart murmur     as a child per pt  . COPD (chronic obstructive pulmonary disease)    Past Surgical History  Procedure Laterality Date  . Hernia repair      Hiatal   . Cholecystectomy    . Tubal ligation  1983  . Hiatal hernia repair     Family History  Problem Relation Age of Onset  . Lung cancer Mother   . Arthritis Mother     Rheumatoid  . Bursitis Mother   . Heart attack Father 57    Died of MI  . Obesity Father   . Fibromyalgia Sister   . Asthma Mother   . Asthma Child    History  Substance Use Topics  . Smoking status: Current Every Day Smoker -- 0.25 packs/day for 27 years     Types: Cigarettes  . Smokeless tobacco: Never Used  . Alcohol Use: No   OB History    No data available     Review of Systems  Musculoskeletal: Positive for myalgias and back pain.  Neurological: Positive for numbness.  All other systems reviewed and are negative.  Allergies  Cymbalta; Levaquin; Shellfish allergy; Sulfamethoxazole; and Codeine  Home Medications   Prior to Admission medications   Medication Sig Start Date End Date Taking? Authorizing Provider  albuterol (PROVENTIL HFA;VENTOLIN HFA) 108 (90 BASE) MCG/ACT inhaler Inhale 2 puffs into the lungs every 6 (six) hours as needed. Shortness of breath    Historical Provider, MD  albuterol (PROVENTIL) (2.5 MG/3ML) 0.083% nebulizer solution Take 2.5 mg by nebulization every 6 (six) hours as needed. Shortness of breath    Historical Provider, MD  amoxicillin-clavulanate (AUGMENTIN) 875-125 MG per tablet Take 1 tablet by mouth 2 (two) times daily.    Historical Provider, MD  cetirizine (ZYRTEC) 10 MG tablet Take 10 mg by mouth daily.    Historical Provider, MD  cyclobenzaprine (FLEXERIL) 5 MG tablet Take 1 tablet (5 mg total) by mouth 3 (three) times daily as needed for muscle spasms. 01/11/15  Noland Fordyce, PA-C  HYDROcodone-acetaminophen (NORCO/VICODIN) 5-325 MG per tablet Take 0.5-1 tablets by mouth 2 (two) times daily as needed for moderate pain. 01/11/15   Noland Fordyce, PA-C  ibuprofen (ADVIL,MOTRIN) 200 MG tablet Take 2 tablets (400 mg total) by mouth every 6 (six) hours as needed. 01/11/15   Noland Fordyce, PA-C  LORazepam (ATIVAN) 1 MG tablet Take 0.5 mg by mouth every 8 (eight) hours as needed (anxiety).  03/16/11   Alycia Rossetti, MD  pantoprazole (PROTONIX) 20 MG tablet Take 2 tablets (40 mg total) by mouth 2 (two) times daily. 04/07/13   Janett Billow D. Zehr, PA-C  predniSONE (DELTASONE) 20 MG tablet Take 20-60 mg by mouth daily. Take 3 tablets by mouth daily for 3 days, then 2 tablets daily for 2 days, then 1 tablet daily for 1  day.    Historical Provider, MD  Simethicone 80 MG TABS Take 80 mg by mouth 2 (two) times daily as needed (gas/ heartburn).     Historical Provider, MD   BP 113/75 mmHg  Pulse 70  Temp(Src) 97.4 F (36.3 C) (Oral)  Resp 18  Ht 5\' 3"  (1.6 m)  Wt 148 lb (67.132 kg)  BMI 26.22 kg/m2  SpO2 96%   Physical Exam  Constitutional: She is oriented to person, place, and time. She appears well-developed and well-nourished.  HENT:  Head: Normocephalic and atraumatic.  Eyes: EOM are normal.  Neck: Normal range of motion.  Cardiovascular: Normal rate, regular rhythm and normal heart sounds.   Pulses:      Radial pulses are 2+ on the right side, and 2+ on the left side.       Dorsalis pedis pulses are 2+ on the right side, and 2+ on the left side.  Pulmonary/Chest: Effort normal. No respiratory distress.  Abdominal: Soft. There is no tenderness.  Musculoskeletal: Normal range of motion.  Tender to right hip, right buttock over sacrum and lower lumbar spine surrounding musculature.  Neurological: She is alert and oriented to person, place, and time.  Sensation to upper and lower extremities in tact bilaterally  Skin: Skin is warm and dry.  Psychiatric: She has a normal mood and affect. Her behavior is normal.  Nursing note and vitals reviewed.   ED Course  Procedures (including critical care time)  DIAGNOSTIC STUDIES: Oxygen Saturation is 96% on RA, normal by my interpretation.    COORDINATION OF CARE: 2:55 PM-Advised pt of lumbar xray results. Discussed treatment plan which includes hip xray with pt at bedside and pt agreed to plan. Advised pt to follow up with her orthopedist, Dr. Erlinda Hong.   Labs Review Labs Reviewed - No data to display  Imaging Review Dg Lumbar Spine Complete  01/11/2015   CLINICAL DATA:  54 year old female who fell on ice last night with lumbar back pain radiating to the right hip. Initial encounter.  EXAM: LUMBAR SPINE - COMPLETE 4+ VIEW  COMPARISON:  CT Abdomen and  Pelvis 02/17/2013.  FINDINGS: Stable cholecystectomy clips. Osteopenia. Normal lumbar segmentation. Compression fracture of L3, deformity primarily affecting the superior endplate. Mild loss of height. Elsewhere stable lumbar vertebral height and alignment. Visible lower thoracic levels appear intact. No pars fracture identified. Sacrum appears grossly stable.  IMPRESSION: Mild L3 compression fracture.   Electronically Signed   By: Genevie Ann M.D.   On: 01/11/2015 14:40   Dg Sacrum/coccyx  01/11/2015   CLINICAL DATA:  55 year old female who fell on ice last night with lumbar back pain radiating to the right  hip. Initial encounter.  EXAM: SACRUM AND COCCYX - 2+ VIEW  COMPARISON:  Lumbar spine series from the same day reported separately. CT Abdomen and Pelvis 02/17/2013.  FINDINGS: Sacral ala and SI joints appear within normal limits. On lateral view sacral and coccygeal segments appear stable since 2014 and intact. No pelvis fracture identified.  IMPRESSION: No acute fracture or dislocation identified about the sacrum.   Electronically Signed   By: Genevie Ann M.D.   On: 01/11/2015 14:41   Dg Hip Unilat With Pelvis 2-3 Views Right  01/11/2015   CLINICAL DATA:  54 year old female with history of trauma from a fall onto ice yesterday evening complaining of right-sided hip pain.  EXAM: RIGHT HIP (WITH PELVIS) 2-3 VIEWS  COMPARISON:  No priors.  FINDINGS: AP view of the pelvis and AP and lateral views of the right hip demonstrate no acute displaced fractures of the bony pelvic ring. Visualized portions of the proximal femurs are intact bilaterally, and the right femoral head is properly located. Joint space narrowing, subchondral sclerosis, subchondral cyst formation and osteophyte formation is noted in the hip joints bilaterally, compatible with mild bilateral hip joint osteoarthritis.  IMPRESSION: 1. No acute radiographic abnormality of the bony pelvis or the right hip. 2. Mild bilateral hip joint osteoarthritis.    Electronically Signed   By: Vinnie Langton M.D.   On: 01/11/2015 15:24     EKG Interpretation None      MDM   Final diagnoses:  Buttock pain  Right hip pain  Lumbar compression fracture, closed, initial encounter  Fall from slipping on ice, initial encounter  Arthritis of right hip   No red flag symptoms. Will tx symptomatically for pain.  I personally performed the services described in this documentation, which was scribed in my presence. The recorded information has been reviewed and is accurate.    Noland Fordyce, PA-C 01/11/15 1618  Charlesetta Shanks, MD 01/13/15 1710

## 2015-01-11 NOTE — ED Notes (Signed)
Pt sts slipped and fell on ice last night onto buttocks; pt sts pain in lower back down right leg and tailbone area since fall

## 2015-01-11 NOTE — ED Notes (Signed)
Pt made aware to return if symptoms worsen or if any life threatening symptoms occur.   

## 2015-01-17 ENCOUNTER — Other Ambulatory Visit: Payer: Self-pay | Admitting: Orthopaedic Surgery

## 2015-01-17 DIAGNOSIS — M545 Low back pain, unspecified: Secondary | ICD-10-CM

## 2015-01-17 DIAGNOSIS — G8929 Other chronic pain: Secondary | ICD-10-CM

## 2015-01-30 ENCOUNTER — Ambulatory Visit
Admission: RE | Admit: 2015-01-30 | Discharge: 2015-01-30 | Disposition: A | Discharge status: Home / Self Care | Payer: Medicaid Other | Source: Ambulatory Visit | Attending: Orthopaedic Surgery | Admitting: Orthopaedic Surgery

## 2015-01-30 DIAGNOSIS — M545 Low back pain: Principal | ICD-10-CM

## 2015-01-30 DIAGNOSIS — G8929 Other chronic pain: Secondary | ICD-10-CM

## 2015-02-03 ENCOUNTER — Ambulatory Visit
Admission: RE | Admit: 2015-02-03 | Discharge: 2015-02-03 | Disposition: A | Discharge status: Home / Self Care | Payer: Medicaid Other | Source: Ambulatory Visit | Attending: Orthopaedic Surgery | Admitting: Orthopaedic Surgery

## 2015-02-16 DIAGNOSIS — Z8781 Personal history of (healed) traumatic fracture: Secondary | ICD-10-CM | POA: Insufficient documentation

## 2015-03-09 ENCOUNTER — Other Ambulatory Visit: Payer: Self-pay | Admitting: Orthopaedic Surgery

## 2015-03-09 DIAGNOSIS — M545 Low back pain, unspecified: Secondary | ICD-10-CM

## 2015-03-09 DIAGNOSIS — G8929 Other chronic pain: Secondary | ICD-10-CM

## 2015-03-14 ENCOUNTER — Encounter (HOSPITAL_BASED_OUTPATIENT_CLINIC_OR_DEPARTMENT_OTHER): Payer: Self-pay | Admitting: *Deleted

## 2015-03-14 ENCOUNTER — Emergency Department (HOSPITAL_BASED_OUTPATIENT_CLINIC_OR_DEPARTMENT_OTHER)
Admission: EM | Admit: 2015-03-14 | Discharge: 2015-03-15 | Discharge status: Against Medical Advice | Payer: Medicaid Other | Attending: Emergency Medicine | Admitting: Emergency Medicine

## 2015-03-14 ENCOUNTER — Emergency Department (HOSPITAL_BASED_OUTPATIENT_CLINIC_OR_DEPARTMENT_OTHER): Payer: Medicaid Other

## 2015-03-14 DIAGNOSIS — Z791 Long term (current) use of non-steroidal anti-inflammatories (NSAID): Secondary | ICD-10-CM | POA: Diagnosis not present

## 2015-03-14 DIAGNOSIS — J449 Chronic obstructive pulmonary disease, unspecified: Secondary | ICD-10-CM | POA: Insufficient documentation

## 2015-03-14 DIAGNOSIS — F419 Anxiety disorder, unspecified: Secondary | ICD-10-CM | POA: Insufficient documentation

## 2015-03-14 DIAGNOSIS — K219 Gastro-esophageal reflux disease without esophagitis: Secondary | ICD-10-CM | POA: Insufficient documentation

## 2015-03-14 DIAGNOSIS — Z3202 Encounter for pregnancy test, result negative: Secondary | ICD-10-CM | POA: Diagnosis not present

## 2015-03-14 DIAGNOSIS — F329 Major depressive disorder, single episode, unspecified: Secondary | ICD-10-CM | POA: Diagnosis not present

## 2015-03-14 DIAGNOSIS — Z720 Tobacco use: Secondary | ICD-10-CM | POA: Insufficient documentation

## 2015-03-14 DIAGNOSIS — Z79899 Other long term (current) drug therapy: Secondary | ICD-10-CM | POA: Insufficient documentation

## 2015-03-14 DIAGNOSIS — M545 Low back pain: Secondary | ICD-10-CM | POA: Insufficient documentation

## 2015-03-14 DIAGNOSIS — E041 Nontoxic single thyroid nodule: Secondary | ICD-10-CM | POA: Insufficient documentation

## 2015-03-14 DIAGNOSIS — Z87311 Personal history of (healed) other pathological fracture: Secondary | ICD-10-CM | POA: Diagnosis not present

## 2015-03-14 DIAGNOSIS — M549 Dorsalgia, unspecified: Secondary | ICD-10-CM

## 2015-03-14 DIAGNOSIS — M19011 Primary osteoarthritis, right shoulder: Secondary | ICD-10-CM | POA: Diagnosis not present

## 2015-03-14 DIAGNOSIS — R011 Cardiac murmur, unspecified: Secondary | ICD-10-CM | POA: Diagnosis not present

## 2015-03-14 DIAGNOSIS — Z8619 Personal history of other infectious and parasitic diseases: Secondary | ICD-10-CM | POA: Insufficient documentation

## 2015-03-14 DIAGNOSIS — G8929 Other chronic pain: Secondary | ICD-10-CM | POA: Diagnosis not present

## 2015-03-14 MED ORDER — DEXAMETHASONE SODIUM PHOSPHATE 10 MG/ML IJ SOLN
10.0000 mg | Freq: Once | INTRAMUSCULAR | Status: AC
  Administered 2015-03-14: 10 mg via INTRAMUSCULAR
  Filled 2015-03-14: qty 1

## 2015-03-14 MED ORDER — METHOCARBAMOL 500 MG PO TABS
1000.0000 mg | ORAL_TABLET | Freq: Once | ORAL | Status: AC
  Administered 2015-03-14: 1000 mg via ORAL
  Filled 2015-03-14: qty 2

## 2015-03-14 MED ORDER — KETOROLAC TROMETHAMINE 60 MG/2ML IM SOLN
60.0000 mg | Freq: Once | INTRAMUSCULAR | Status: AC
  Administered 2015-03-14: 60 mg via INTRAMUSCULAR
  Filled 2015-03-14: qty 2

## 2015-03-14 NOTE — ED Provider Notes (Signed)
CSN: [id number]     Arrival date & time 03/14/15  2026 History  This chart was scribed for Diem Pagnotta, MD by Tula Nakayama, ED Scribe. This patient was seen in room MH08/MH08 and the patient's care was started at 11:15 PM.    Chief Complaint  Patient presents with  . Back Pain   Patient is a 54 y.o. female presenting with back pain. The history is provided by the patient. No language interpreter was used.  Back Pain Location:  Lumbar spine Quality:  Unable to specify Radiates to:  R posterior upper leg Pain severity:  Moderate Pain is:  Unable to specify Onset quality:  Gradual Duration:  2 months Timing:  Constant Progression:  Unchanged Chronicity:  Chronic Context: not MCA   Relieved by:  Nothing Worsened by:  Nothing tried Ineffective treatments:  Ibuprofen Associated symptoms: no abdominal pain, no abdominal swelling, no bladder incontinence, no bowel incontinence, no chest pain, no dysuria, no fever, no headaches, no numbness, no paresthesias, no pelvic pain, no perianal numbness, no tingling, no weakness and no weight loss   Risk factors: not obese    HPI Comments: Sheri Rivera is a 55 y.o. female who presents to the Emergency Department complaining of an acute-on-chronic episode of moderate lower back pain that radiates to her right leg and started 2 months ago. Pt has tried Ibuprofen with no relief. Pt was first evaluated for pain 2 months ago and was diagnosed with a lumbar fracture. She states she is currently being followed by [facility] and had an MRI which showed pinched nerves. Pt is scheduled for an injection at the end of next week or the beginning of next week. She denies difficulty urinating and constipation as associated symptoms.  [place]  Past Medical History  Diagnosis Date  . GERD (gastroesophageal reflux disease)   . Thyroid nodule   . Anxiety 11/11/11  . Asthma   . Herpes   . Seasonal allergies   . Arthritis   .  Depression   . Fibromyalgia   . IBS (irritable bowel syndrome)   . DDD (degenerative disc disease)   . Bursitis     right shoulder  . Heart murmur     as a child per pt  . COPD (chronic obstructive pulmonary disease)    Past Surgical History  Procedure Laterality Date  . Hernia repair      Hiatal   . Cholecystectomy    . Tubal ligation  1983  . Hiatal hernia repair     Family History  Problem Relation Age of Onset  . Lung cancer Mother   . Arthritis Mother     Rheumatoid  . Bursitis Mother   . Heart attack Father 38    Died of MI  . Obesity Father   . Fibromyalgia Sister   . Asthma Mother   . Asthma Child    History  Substance Use Topics  . Smoking status: Current Every Day Smoker -- 0.25 packs/day for 27 years    Types: Cigarettes  . Smokeless tobacco: Never Used  . Alcohol Use: No   OB History    No data available     Review of Systems  Constitutional: Negative for fever and weight loss.  Cardiovascular: Negative for chest pain.  Gastrointestinal: Negative for abdominal pain, constipation and bowel incontinence.  Genitourinary: Negative for bladder incontinence, dysuria, difficulty urinating and pelvic pain.  Musculoskeletal: Positive for back pain.  Neurological: Negative for tingling, weakness, numbness, headaches  and paresthesias.  All other systems reviewed and are negative.     Allergies  Cymbalta; Levaquin; Shellfish allergy; Sulfamethoxazole; and Codeine  Home Medications   Prior to Admission medications   Medication Sig Start Date End Date Taking? Authorizing Provider  albuterol (PROVENTIL HFA;VENTOLIN HFA) 108 (90 BASE) MCG/ACT inhaler Inhale 2 puffs into the lungs every 6 (six) hours as needed. Shortness of breath    Historical Provider, MD  albuterol (PROVENTIL) (2.5 MG/3ML) 0.083% nebulizer solution Take 2.5 mg by nebulization every 6 (six) hours as needed. Shortness of breath    Historical Provider, MD  amoxicillin-clavulanate (AUGMENTIN)  875-125 MG per tablet Take 1 tablet by mouth 2 (two) times daily.    Historical Provider, MD  cetirizine (ZYRTEC) 10 MG tablet Take 10 mg by mouth daily.    Historical Provider, MD  cyclobenzaprine (FLEXERIL) 5 MG tablet Take 1 tablet (5 mg total) by mouth 3 (three) times daily as needed for muscle spasms. 01/11/15   Noland Fordyce, PA-C  HYDROcodone-acetaminophen (NORCO/VICODIN) 5-325 MG per tablet Take 0.5-1 tablets by mouth 2 (two) times daily as needed for moderate pain. 01/11/15   Noland Fordyce, PA-C  ibuprofen (ADVIL,MOTRIN) 200 MG tablet Take 2 tablets (400 mg total) by mouth every 6 (six) hours as needed. 01/11/15   Noland Fordyce, PA-C  LORazepam (ATIVAN) 1 MG tablet Take 0.5 mg by mouth every 8 (eight) hours as needed (anxiety).  03/16/11   Alycia Rossetti, MD  pantoprazole (PROTONIX) 20 MG tablet Take 2 tablets (40 mg total) by mouth 2 (two) times daily. 04/07/13   Jessica D Zehr, PA-C  predniSONE (DELTASONE) 20 MG tablet Take 20-60 mg by mouth daily. Take 3 tablets by mouth daily for 3 days, then 2 tablets daily for 2 days, then 1 tablet daily for 1 day.    Historical Provider, MD  Simethicone 80 MG TABS Take 80 mg by mouth 2 (two) times daily as needed (gas/ heartburn).     Historical Provider, MD   BP 109/56 mmHg  Pulse 86  Temp(Src) 98 F (36.7 C) (Oral)  Resp 18  Ht 5\' 3"  (1.6 m)  Wt 161 lb (73.029 kg)  BMI 28.53 kg/m2  SpO2 96% Physical Exam  Constitutional: She is oriented to person, place, and time. She appears well-developed and well-nourished. No distress.  HENT:  Head: Normocephalic and atraumatic.  Mouth/Throat: Oropharynx is clear and moist. No oropharyngeal exudate.  Moist mucous membranes   Eyes: Conjunctivae and EOM are normal. Pupils are equal, round, and reactive to light.  Neck: Normal range of motion. Neck supple. No tracheal deviation present.  Cardiovascular: Normal rate and intact distal pulses.   Pulmonary/Chest: Effort normal. No respiratory distress. She has  no wheezes.  Abdominal: Soft. Bowel sounds are normal. There is no tenderness. There is no rebound and no guarding.  Musculoskeletal: Normal range of motion. She exhibits no edema or tenderness.  Intact posterior tibial; intact dorsalis pedis;   Neurological: She is alert and oriented to person, place, and time. She has normal reflexes.  Achilles Tendon reflex and intact DTR's B L5/s1 intact  Skin: Skin is warm and dry.  Psychiatric: She has a normal mood and affect. Her behavior is normal.  Nursing note and vitals reviewed.   ED Course  Procedures   DIAGNOSTIC STUDIES: Oxygen Saturation is 96% on RA, adequate by my interpretation.    COORDINATION OF CARE: 11:19 PM Discussed treatment plan with pt at bedside and pt agreed to plan.  Labs Review Labs Reviewed - No data to display  Imaging Review No results found.   EKG Interpretation None      MDM   Final diagnoses:  None   Medications  ketorolac (TORADOL) injection 60 mg (60 mg Intramuscular Given 03/14/15 2332)  dexamethasone (DECADRON) injection 10 mg (10 mg Intramuscular Given 03/14/15 2332)  methocarbamol (ROBAXIN) tablet 1,000 mg (1,000 mg Oral Given 03/14/15 2332)  oxyCODONE-acetaminophen (PERCOCET/ROXICET) 5-325 MG per tablet 2 tablet (2 tablets Oral Given 03/15/15 0044)    Chronic back pain documented in care everywhere.  Awaiting Xray patient apparently eloped while waiting for Xray to come back     I personally performed the services described in this documentation, which was scribed in my presence. The recorded information has been reviewed and is accurate.    Veatrice Kells, MD 03/15/15 [phone]

## 2015-03-14 NOTE — ED Notes (Signed)
Pt c/o lower back pain which radiates down right leg being seen by Ortho for same

## 2015-03-15 LAB — PREGNANCY, URINE: Preg Test, Ur: NEGATIVE

## 2015-03-15 MED ORDER — OXYCODONE-ACETAMINOPHEN 5-325 MG PO TABS
2.0000 | ORAL_TABLET | Freq: Once | ORAL | Status: AC
  Administered 2015-03-15: 2 via ORAL
  Filled 2015-03-15: qty 2

## 2015-03-15 NOTE — ED Notes (Signed)
Went in to "report" to the patient the delay at this time. The patient is very anxious and states that she needs a wheel chair to go outside for a few minutes. When the patient informed of the policies about leaving the ER while still under treatment she reports to her son that " I need to get out of here I am having a panic attack and I can not wait any longer" The patient is walking around the room with a slight limp noted. The patient also reports that needs to get out of here. The patient advised of the effects of leaving without being fully seen and patient still wants to leave AMA and signs the paper work

## 2015-03-22 ENCOUNTER — Encounter (HOSPITAL_COMMUNITY): Payer: Self-pay | Admitting: *Deleted

## 2015-03-22 ENCOUNTER — Emergency Department (HOSPITAL_COMMUNITY)
Admission: EM | Admit: 2015-03-22 | Discharge: 2015-03-23 | Disposition: A | Discharge status: Home / Self Care | Payer: Medicaid Other | Attending: Emergency Medicine | Admitting: Emergency Medicine

## 2015-03-22 DIAGNOSIS — J449 Chronic obstructive pulmonary disease, unspecified: Secondary | ICD-10-CM | POA: Insufficient documentation

## 2015-03-22 DIAGNOSIS — Z8639 Personal history of other endocrine, nutritional and metabolic disease: Secondary | ICD-10-CM | POA: Insufficient documentation

## 2015-03-22 DIAGNOSIS — Z79899 Other long term (current) drug therapy: Secondary | ICD-10-CM | POA: Diagnosis not present

## 2015-03-22 DIAGNOSIS — J45909 Unspecified asthma, uncomplicated: Secondary | ICD-10-CM | POA: Diagnosis not present

## 2015-03-22 DIAGNOSIS — R011 Cardiac murmur, unspecified: Secondary | ICD-10-CM | POA: Diagnosis not present

## 2015-03-22 DIAGNOSIS — M1990 Unspecified osteoarthritis, unspecified site: Secondary | ICD-10-CM | POA: Diagnosis not present

## 2015-03-22 DIAGNOSIS — Z720 Tobacco use: Secondary | ICD-10-CM | POA: Insufficient documentation

## 2015-03-22 DIAGNOSIS — K219 Gastro-esophageal reflux disease without esophagitis: Secondary | ICD-10-CM | POA: Insufficient documentation

## 2015-03-22 DIAGNOSIS — Z8619 Personal history of other infectious and parasitic diseases: Secondary | ICD-10-CM | POA: Diagnosis not present

## 2015-03-22 DIAGNOSIS — M797 Fibromyalgia: Secondary | ICD-10-CM | POA: Insufficient documentation

## 2015-03-22 DIAGNOSIS — M5431 Sciatica, right side: Secondary | ICD-10-CM

## 2015-03-22 DIAGNOSIS — M5441 Lumbago with sciatica, right side: Secondary | ICD-10-CM | POA: Insufficient documentation

## 2015-03-22 DIAGNOSIS — F419 Anxiety disorder, unspecified: Secondary | ICD-10-CM | POA: Insufficient documentation

## 2015-03-22 DIAGNOSIS — M545 Low back pain: Secondary | ICD-10-CM | POA: Diagnosis present

## 2015-03-22 NOTE — ED Notes (Signed)
Pt admits to lower back pain that radiates to her legs that began yesterday evening, pt denies any known mechanism of injury. Pt tearful on assessment, grimacing and breathing heavily. Pt states the pain is making her nauseous and the pain is exacerbated w/ movement.

## 2015-03-23 MED ORDER — MORPHINE SULFATE 4 MG/ML IJ SOLN
4.0000 mg | Freq: Once | INTRAMUSCULAR | Status: DC
  Filled 2015-03-23: qty 1

## 2015-03-23 MED ORDER — PREDNISONE 20 MG PO TABS
60.0000 mg | ORAL_TABLET | Freq: Once | ORAL | Status: AC
  Administered 2015-03-23: 60 mg via ORAL
  Filled 2015-03-23: qty 3

## 2015-03-23 MED ORDER — METHOCARBAMOL 500 MG PO TABS
750.0000 mg | ORAL_TABLET | Freq: Once | ORAL | Status: AC
  Administered 2015-03-23: 750 mg via ORAL
  Filled 2015-03-23: qty 2

## 2015-03-23 MED ORDER — KETOROLAC TROMETHAMINE 30 MG/ML IJ SOLN
30.0000 mg | Freq: Once | INTRAMUSCULAR | Status: AC
  Administered 2015-03-23: 30 mg via INTRAMUSCULAR
  Filled 2015-03-23: qty 1

## 2015-03-23 MED ORDER — METHOCARBAMOL 750 MG PO TABS: 750.0000 mg | ORAL_TABLET | Freq: Three times a day (TID) | ORAL | Status: DC | PRN

## 2015-03-23 MED ORDER — HYDROCODONE-ACETAMINOPHEN 5-325 MG PO TABS: 2.0000 | ORAL_TABLET | ORAL | Status: DC | PRN

## 2015-03-23 MED ORDER — PREDNISONE 20 MG PO TABS: ORAL_TABLET | ORAL | Status: DC

## 2015-03-23 NOTE — Discharge Instructions (Signed)
Take the medication as directed Make an apointment with your PCP for follow up care

## 2015-03-23 NOTE — ED Provider Notes (Signed)
CSN: [id number]     Arrival date & time 03/22/15  2015 History   First MD Initiated Contact with Patient 03/23/15 0101     Chief Complaint  Patient presents with  . Back Pain  . Leg Pain     (Consider location/radiation/quality/duration/timing/severity/associated sxs/prior Treatment) HPI Comments: Patient states that she is having lower back pain that radiates to her right leg down to her posterior right thigh crosses at the knee and into her right.  This is been going on for greater than 24 hours.  She states that she had a back injury and number of years ago, but that the fracture has healed since that time she has had intermittent episodes are of this particular type of back pain but this is the worst episode yet. Patient denies any dysuria, incontinence of bowel or bladder, numbness or tingling  Patient is a 54 y.o. female presenting with back pain and leg pain. The history is provided by the patient.  Back Pain Location:  Lumbar spine Quality:  Aching Radiates to:  R knee, R thigh and R posterior upper leg Pain severity:  Severe Onset quality:  Gradual Duration:  1 day Timing:  Constant Progression:  Worsening Chronicity:  Recurrent Context: not recent injury and not twisting   Relieved by:  Nothing Worsened by:  Nothing tried Associated symptoms: leg pain   Associated symptoms: no dysuria, no fever, no numbness, no perianal numbness, no tingling, no weakness and no weight loss   Leg Pain Associated symptoms: back pain   Associated symptoms: no fever     Past Medical History  Diagnosis Date  . GERD (gastroesophageal reflux disease)   . Thyroid nodule   . Anxiety 11/11/11  . Asthma   . Herpes   . Seasonal allergies   . Arthritis   . Depression   . Fibromyalgia   . IBS (irritable bowel syndrome)   . DDD (degenerative disc disease)   . Bursitis     right shoulder  . Heart murmur     as a child per pt  . COPD (chronic obstructive pulmonary disease)    Past  Surgical History  Procedure Laterality Date  . Hernia repair      Hiatal   . Cholecystectomy    . Tubal ligation  1983  . Hiatal hernia repair     Family History  Problem Relation Age of Onset  . Lung cancer Mother   . Arthritis Mother     Rheumatoid  . Bursitis Mother   . Heart attack Father 14    Died of MI  . Obesity Father   . Fibromyalgia Sister   . Asthma Mother   . Asthma Child    History  Substance Use Topics  . Smoking status: Current Every Day Smoker -- 0.25 packs/day for 27 years    Types: Cigarettes  . Smokeless tobacco: Never Used  . Alcohol Use: No   OB History    No data available     Review of Systems  Constitutional: Negative for fever and weight loss.  Genitourinary: Negative for dysuria.  Musculoskeletal: Positive for back pain.  Skin: Negative for rash and wound.  Neurological: Negative for dizziness, tingling, weakness and numbness.      Allergies  Cymbalta; Levaquin; Lyrica; Shellfish allergy; Sulfamethoxazole; and Codeine  Home Medications   Prior to Admission medications   Medication Sig Start Date End Date Taking? Authorizing Provider  Aspirin-Acetaminophen-Caffeine 260-130-16 MG TABS Take 2 packets by mouth every 4 (  four) hours as needed (pain).   Yes Historical Provider, MD  cetirizine (ZYRTEC) 10 MG tablet Take 10 mg by mouth daily.   Yes Historical Provider, MD  ibuprofen (ADVIL,MOTRIN) 200 MG tablet Take 2 tablets (400 mg total) by mouth every 6 (six) hours as needed. Patient taking differently: Take 600 mg by mouth every 6 (six) hours as needed for moderate pain.  01/11/15  Yes Noland Fordyce, PA-C  LORazepam (ATIVAN) 1 MG tablet Take 0.5 mg by mouth every 8 (eight) hours as needed (anxiety).  03/16/11  Yes Alycia Rossetti, MD  albuterol (PROVENTIL HFA;VENTOLIN HFA) 108 (90 BASE) MCG/ACT inhaler Inhale 2 puffs into the lungs every 6 (six) hours as needed. Shortness of breath    Historical Provider, MD  albuterol (PROVENTIL) (2.5  MG/3ML) 0.083% nebulizer solution Take 2.5 mg by nebulization every 6 (six) hours as needed. Shortness of breath    Historical Provider, MD  cyclobenzaprine (FLEXERIL) 5 MG tablet Take 1 tablet (5 mg total) by mouth 3 (three) times daily as needed for muscle spasms. Patient not taking: Reported on 03/23/2015 01/11/15   Noland Fordyce, PA-C  HYDROcodone-acetaminophen (NORCO/VICODIN) 5-325 MG per tablet Take 2 tablets by mouth every 4 (four) hours as needed. 03/23/15   Junius Creamer, NP  methocarbamol (ROBAXIN) 750 MG tablet Take 1 tablet (750 mg total) by mouth every 8 (eight) hours as needed for muscle spasms. 03/23/15   Junius Creamer, NP  pantoprazole (PROTONIX) 20 MG tablet Take 2 tablets (40 mg total) by mouth 2 (two) times daily. Patient not taking: Reported on 03/23/2015 04/07/13   Janett Billow D Zehr, PA-C  predniSONE (DELTASONE) 20 MG tablet 3 Tabs PO Days 1-3, then 2 tabs PO Days 4-6, then 1 tab PO Day 7-9, then Half Tab PO Day 10-12 03/23/15   Junius Creamer, NP   BP 109/69 mmHg  Pulse 72  Temp(Src) 97.8 F (36.6 C) (Oral)  Resp 28  SpO2 98% Physical Exam  Constitutional: She appears well-developed and well-nourished.  HENT:  Head: Normocephalic.  Eyes: Pupils are equal, round, and reactive to light.  Neck: Normal range of motion.  Cardiovascular: Normal rate.   Pulmonary/Chest: Effort normal and breath sounds normal.  Musculoskeletal: She exhibits tenderness. She exhibits no edema.       Lumbar back: She exhibits decreased range of motion, tenderness, pain and spasm. She exhibits no swelling, no edema, no deformity and no laceration.       Back:  Neurological: She is alert.  Skin: Skin is warm. No rash noted. No erythema.  Nursing note and vitals reviewed.   ED Course  Procedures (including critical care time) Labs Review Labs Reviewed - No data to display  Imaging Review No results found.   EKG Interpretation None     Patient has pain at the sciatic notch worse on the right and  left with pain radiating across the buttock into the hip area.  Thigh and calf.  This is been recurrent over the past several years, worse over the past 24 hours.  She has not taken any medication for discomfort other than Tylenol or ibuprofen.  She has a number of medication allergies.  These have been reviewed.  She has been started on steroids as an anti-inflammatory.  Robaxin as a muscle relaxer and she will be given a prescription for Vicodin for severe pain MDM   Final diagnoses:  Sciatica associated with disorder of lumbar spine, right         Junius Creamer,  NP 03/23/15 [id number]  Lajean Saver, MD 03/23/15 [phone]

## 2015-06-13 ENCOUNTER — Emergency Department (HOSPITAL_COMMUNITY)
Admission: EM | Admit: 2015-06-13 | Discharge: 2015-06-14 | Disposition: A | Discharge status: Home / Self Care | Payer: Medicaid Other | Attending: Emergency Medicine | Admitting: Emergency Medicine

## 2015-06-13 DIAGNOSIS — M5441 Lumbago with sciatica, right side: Secondary | ICD-10-CM | POA: Diagnosis not present

## 2015-06-13 DIAGNOSIS — Z720 Tobacco use: Secondary | ICD-10-CM | POA: Insufficient documentation

## 2015-06-13 DIAGNOSIS — M797 Fibromyalgia: Secondary | ICD-10-CM | POA: Diagnosis not present

## 2015-06-13 DIAGNOSIS — J449 Chronic obstructive pulmonary disease, unspecified: Secondary | ICD-10-CM | POA: Insufficient documentation

## 2015-06-13 DIAGNOSIS — M545 Low back pain: Secondary | ICD-10-CM | POA: Diagnosis present

## 2015-06-13 DIAGNOSIS — F419 Anxiety disorder, unspecified: Secondary | ICD-10-CM | POA: Diagnosis not present

## 2015-06-13 DIAGNOSIS — K219 Gastro-esophageal reflux disease without esophagitis: Secondary | ICD-10-CM | POA: Diagnosis not present

## 2015-06-13 DIAGNOSIS — R011 Cardiac murmur, unspecified: Secondary | ICD-10-CM | POA: Insufficient documentation

## 2015-06-13 DIAGNOSIS — Z79899 Other long term (current) drug therapy: Secondary | ICD-10-CM | POA: Insufficient documentation

## 2015-06-13 DIAGNOSIS — R110 Nausea: Secondary | ICD-10-CM | POA: Diagnosis not present

## 2015-06-13 DIAGNOSIS — M1990 Unspecified osteoarthritis, unspecified site: Secondary | ICD-10-CM | POA: Diagnosis not present

## 2015-06-13 DIAGNOSIS — Z8619 Personal history of other infectious and parasitic diseases: Secondary | ICD-10-CM | POA: Insufficient documentation

## 2015-06-14 ENCOUNTER — Encounter (HOSPITAL_COMMUNITY): Payer: Self-pay

## 2015-06-14 MED ORDER — HYDROCODONE-ACETAMINOPHEN 5-325 MG PO TABS
2.0000 | ORAL_TABLET | Freq: Once | ORAL | Status: AC
  Administered 2015-06-14: 2 via ORAL
  Filled 2015-06-14: qty 2

## 2015-06-14 MED ORDER — DIAZEPAM 5 MG PO TABS
5.0000 mg | ORAL_TABLET | Freq: Once | ORAL | Status: AC
  Administered 2015-06-14: 5 mg via ORAL
  Filled 2015-06-14: qty 1

## 2015-06-14 MED ORDER — HYDROCODONE-ACETAMINOPHEN 5-325 MG PO TABS: 2.0000 | ORAL_TABLET | ORAL | Status: DC | PRN

## 2015-06-14 MED ORDER — METHOCARBAMOL 750 MG PO TABS: 750.0000 mg | ORAL_TABLET | Freq: Three times a day (TID) | ORAL | Status: DC | PRN

## 2015-06-14 NOTE — ED Provider Notes (Signed)
CSN: [id number]     Arrival date & time 06/13/15  2336 History  This chart was scribed for Linton Flemings, MD by Rayna Sexton, ED scribe. This patient was seen in room WA09/WA09 and the patient's care was started at 1:03 AM.     Chief Complaint  Patient presents with  . Back Pain   The history is provided by the patient. No language interpreter was used.    HPI Comments: Sheri Rivera is a 54 y.o. female, with a history of DDD, fibromyalgia and sciatica, who presents to the Emergency Department complaining of worsening, moderate, right lower back pain with onset 1 day ago. She notes associated nausea and further notes a radiation of pain down her right glute and leg. Pt notes trying to make an appointment with her orthopaedist but was unable to find a time so she came to the ED. She notes taking 4x ibuprofen every 3-4 hours today with no relief. She notes a hx of lumbar fracture to her lower spine a few months ago and needing to wear a back brace for a long period of time. She also notes a history of COPD and further notes that she has continued to smoke. Pt notes an allergy to sulfur which causes severe throat swelling and confirms the remaining allergies on file including codeine noting that she cannot take generic vicodin. Pt denies any vomiting or incontinence of her bowels or bladder.   Past Medical History  Diagnosis Date  . GERD (gastroesophageal reflux disease)   . Thyroid nodule   . Anxiety 11/11/11  . Asthma   . Herpes   . Seasonal allergies   . Arthritis   . Depression   . Fibromyalgia   . IBS (irritable bowel syndrome)   . DDD (degenerative disc disease)   . Bursitis     right shoulder  . Heart murmur     as a child per pt  . COPD (chronic obstructive pulmonary disease)    Past Surgical History  Procedure Laterality Date  . Hernia repair      Hiatal   . Cholecystectomy    . Tubal ligation  1983  . Hiatal hernia repair     Family History  Problem Relation Age of  Onset  . Lung cancer Mother   . Arthritis Mother     Rheumatoid  . Bursitis Mother   . Heart attack Father 13    Died of MI  . Obesity Father   . Fibromyalgia Sister   . Asthma Mother   . Asthma Child    History  Substance Use Topics  . Smoking status: Current Every Day Smoker -- 0.25 packs/day for 27 years    Types: Cigarettes  . Smokeless tobacco: Never Used  . Alcohol Use: No   OB History    No data available     Review of Systems  Gastrointestinal: Positive for nausea. Negative for vomiting.       Denies incontinence of bowels  Genitourinary:       Denies incontinence of bladder  Musculoskeletal: Positive for myalgias and back pain.  All other systems reviewed and are negative.  Allergies  Cymbalta; Levaquin; Lyrica; Shellfish allergy; Sulfamethoxazole; and Codeine  Home Medications   Prior to Admission medications   Medication Sig Start Date End Date Taking? Authorizing Provider  albuterol (PROVENTIL HFA;VENTOLIN HFA) 108 (90 BASE) MCG/ACT inhaler Inhale 2 puffs into the lungs every 6 (six) hours as needed. Shortness of breath    Historical  Provider, MD  albuterol (PROVENTIL) (2.5 MG/3ML) 0.083% nebulizer solution Take 2.5 mg by nebulization every 6 (six) hours as needed. Shortness of breath    Historical Provider, MD  Aspirin-Acetaminophen-Caffeine 260-130-16 MG TABS Take 2 packets by mouth every 4 (four) hours as needed (pain).    Historical Provider, MD  cetirizine (ZYRTEC) 10 MG tablet Take 10 mg by mouth daily.    Historical Provider, MD  cyclobenzaprine (FLEXERIL) 5 MG tablet Take 1 tablet (5 mg total) by mouth 3 (three) times daily as needed for muscle spasms. Patient not taking: Reported on 03/23/2015 01/11/15   Noland Fordyce, PA-C  HYDROcodone-acetaminophen (NORCO/VICODIN) 5-325 MG per tablet Take 2 tablets by mouth every 4 (four) hours as needed. 03/23/15   Junius Creamer, NP  ibuprofen (ADVIL,MOTRIN) 200 MG tablet Take 2 tablets (400 mg total) by mouth every 6  (six) hours as needed. Patient taking differently: Take 600 mg by mouth every 6 (six) hours as needed for moderate pain.  01/11/15   Noland Fordyce, PA-C  LORazepam (ATIVAN) 1 MG tablet Take 0.5 mg by mouth every 8 (eight) hours as needed (anxiety).  03/16/11   Alycia Rossetti, MD  methocarbamol (ROBAXIN) 750 MG tablet Take 1 tablet (750 mg total) by mouth every 8 (eight) hours as needed for muscle spasms. 03/23/15   Junius Creamer, NP  pantoprazole (PROTONIX) 20 MG tablet Take 2 tablets (40 mg total) by mouth 2 (two) times daily. Patient not taking: Reported on 03/23/2015 04/07/13   Janett Billow D Zehr, PA-C  predniSONE (DELTASONE) 20 MG tablet 3 Tabs PO Days 1-3, then 2 tabs PO Days 4-6, then 1 tab PO Day 7-9, then Half Tab PO Day 10-12 03/23/15   Junius Creamer, NP   BP 112/72 mmHg  Pulse 73  Temp(Src) 98.3 F (36.8 C) (Oral)  Resp 18  SpO2 95% Physical Exam  Constitutional: She is oriented to person, place, and time. She appears well-developed and well-nourished. She appears distressed.  HENT:  Head: Normocephalic and atraumatic.  Nose: Nose normal.  Mouth/Throat: Oropharynx is clear and moist.  Eyes: Conjunctivae and EOM are normal. Pupils are equal, round, and reactive to light.  Neck: Normal range of motion. Neck supple. No JVD present. No tracheal deviation present. No thyromegaly present.  Cardiovascular: Normal rate, regular rhythm, normal heart sounds and intact distal pulses.  Exam reveals no gallop and no friction rub.   No murmur heard. Pulmonary/Chest: Effort normal and breath sounds normal. No stridor. No respiratory distress. She has no wheezes. She has no rales. She exhibits no tenderness.  Abdominal: Soft. Bowel sounds are normal. She exhibits no distension and no mass. There is no tenderness. There is no rebound and no guarding.  Musculoskeletal: Normal range of motion. She exhibits no edema or tenderness.  Patient has tenderness to lumbar region, both midline and paraspinal.  There is  no step-off or crepitus.  She has tenderness over the right SI joint.  Straight leg raise is equivocal.  Lymphadenopathy:    She has no cervical adenopathy.  Neurological: She is alert and oriented to person, place, and time. She displays normal reflexes. She exhibits normal muscle tone. Coordination normal.  Skin: Skin is warm and dry. No rash noted. No erythema. No pallor.  Psychiatric: She has a normal mood and affect. Her behavior is normal. Judgment and thought content normal.  Nursing note and vitals reviewed.   ED Course  Procedures  DIAGNOSTIC STUDIES: Oxygen Saturation is 95% on RA, adequate by my interpretation.  COORDINATION OF CARE: 1:10 AM Discussed treatment plan with pt at bedside and pt agreed to plan.  Labs Review Labs Reviewed - No data to display  Imaging Review No results found.   EKG Interpretation None      MDM   Final diagnoses:  None    . 54 year old female with acute on chronic low back pain with radiculopathy.  Patient has an orthopedist that she sees.  Although she denies having this problem in the past, she has had multiple ED visits for same.  Plan for treatment with Valium and Vicodin here, and home with Robaxin and Vicodin until she can see her orthopedist.   Linton Flemings, MD 06/14/15 [phone]

## 2015-06-14 NOTE — ED Notes (Signed)
Pt complains of lower back pain since yesterday that radiates through her buttocks and down her leg

## 2015-06-14 NOTE — Discharge Instructions (Signed)
Sciatica Sciatica is pain, weakness, numbness, or tingling along the path of the sciatic nerve. The nerve starts in the lower back and runs down the back of each leg. The nerve controls the muscles in the lower leg and in the back of the knee, while also providing sensation to the back of the thigh, lower leg, and the sole of your foot. Sciatica is a symptom of another medical condition. For instance, nerve damage or certain conditions, such as a herniated disk or bone spur on the spine, pinch or put pressure on the sciatic nerve. This causes the pain, weakness, or other sensations normally associated with sciatica. Generally, sciatica only affects one side of the body. CAUSES   Herniated or slipped disc.  Degenerative disk disease.  A pain disorder involving the narrow muscle in the buttocks (piriformis syndrome).  Pelvic injury or fracture.  Pregnancy.  Tumor (rare). SYMPTOMS  Symptoms can vary from mild to very severe. The symptoms usually travel from the low back to the buttocks and down the back of the leg. Symptoms can include:  Mild tingling or dull aches in the lower back, leg, or hip.  Numbness in the back of the calf or sole of the foot.  Burning sensations in the lower back, leg, or hip.  Sharp pains in the lower back, leg, or hip.  Leg weakness.  Severe back pain inhibiting movement. These symptoms may get worse with coughing, sneezing, laughing, or prolonged sitting or standing. Also, being overweight may worsen symptoms. DIAGNOSIS  Your caregiver will perform a physical exam to look for common symptoms of sciatica. He or she may ask you to do certain movements or activities that would trigger sciatic nerve pain. Other tests may be performed to find the cause of the sciatica. These may include:  Blood tests.  X-rays.  Imaging tests, such as an MRI or CT scan. TREATMENT  Treatment is directed at the cause of the sciatic pain. Sometimes, treatment is not necessary  and the pain and discomfort goes away on its own. If treatment is needed, your caregiver may suggest:  Over-the-counter medicines to relieve pain.  Prescription medicines, such as anti-inflammatory medicine, muscle relaxants, or narcotics.  Applying heat or ice to the painful area.  Steroid injections to lessen pain, irritation, and inflammation around the nerve.  Reducing activity during periods of pain.  Exercising and stretching to strengthen your abdomen and improve flexibility of your spine. Your caregiver may suggest losing weight if the extra weight makes the back pain worse.  Physical therapy.  Surgery to eliminate what is pressing or pinching the nerve, such as a bone spur or part of a herniated disk. HOME CARE INSTRUCTIONS   Only take over-the-counter or prescription medicines for pain or discomfort as directed by your caregiver.  Apply ice to the affected area for 20 minutes, 3-4 times a day for the first 48-72 hours. Then try heat in the same way.  Exercise, stretch, or perform your usual activities if these do not aggravate your pain.  Attend physical therapy sessions as directed by your caregiver.  Keep all follow-up appointments as directed by your caregiver.  Do not wear high heels or shoes that do not provide proper support.  Check your mattress to see if it is too soft. A firm mattress may lessen your pain and discomfort. SEEK IMMEDIATE MEDICAL CARE IF:   You lose control of your bowel or bladder (incontinence).  You have increasing weakness in the lower back, pelvis, buttocks,   or legs.  You have redness or swelling of your back.  You have a burning sensation when you urinate.  You have pain that gets worse when you lie down or awakens you at night.  Your pain is worse than you have experienced in the past.  Your pain is lasting longer than 4 weeks.  You are suddenly losing weight without reason. MAKE SURE YOU:  Understand these  instructions.  Will watch your condition.  Will get help right away if you are not doing well or get worse. Document Released: 11/06/2001 Document Revised: 05/13/2012 Document Reviewed: 03/23/2012 [employer] Patient Information 2015 [employer]. This information is not intended to replace advice given to you by your health care provider. Make sure you discuss any questions you have with your health care provider.  

## 2015-08-15 ENCOUNTER — Encounter (HOSPITAL_BASED_OUTPATIENT_CLINIC_OR_DEPARTMENT_OTHER): Payer: Self-pay | Admitting: *Deleted

## 2015-08-15 ENCOUNTER — Emergency Department (HOSPITAL_BASED_OUTPATIENT_CLINIC_OR_DEPARTMENT_OTHER)
Admission: EM | Admit: 2015-08-15 | Discharge: 2015-08-15 | Disposition: A | Discharge status: Home / Self Care | Payer: Medicaid Other | Attending: Emergency Medicine | Admitting: Emergency Medicine

## 2015-08-15 DIAGNOSIS — M545 Low back pain: Secondary | ICD-10-CM | POA: Diagnosis not present

## 2015-08-15 DIAGNOSIS — R011 Cardiac murmur, unspecified: Secondary | ICD-10-CM | POA: Diagnosis not present

## 2015-08-15 DIAGNOSIS — Z8619 Personal history of other infectious and parasitic diseases: Secondary | ICD-10-CM | POA: Insufficient documentation

## 2015-08-15 DIAGNOSIS — Z720 Tobacco use: Secondary | ICD-10-CM | POA: Diagnosis not present

## 2015-08-15 DIAGNOSIS — J449 Chronic obstructive pulmonary disease, unspecified: Secondary | ICD-10-CM | POA: Diagnosis not present

## 2015-08-15 DIAGNOSIS — Z8781 Personal history of (healed) traumatic fracture: Secondary | ICD-10-CM | POA: Diagnosis not present

## 2015-08-15 DIAGNOSIS — Z79899 Other long term (current) drug therapy: Secondary | ICD-10-CM | POA: Insufficient documentation

## 2015-08-15 DIAGNOSIS — Z7982 Long term (current) use of aspirin: Secondary | ICD-10-CM | POA: Diagnosis not present

## 2015-08-15 DIAGNOSIS — K219 Gastro-esophageal reflux disease without esophagitis: Secondary | ICD-10-CM | POA: Diagnosis not present

## 2015-08-15 DIAGNOSIS — Z7951 Long term (current) use of inhaled steroids: Secondary | ICD-10-CM | POA: Insufficient documentation

## 2015-08-15 DIAGNOSIS — Z8639 Personal history of other endocrine, nutritional and metabolic disease: Secondary | ICD-10-CM | POA: Insufficient documentation

## 2015-08-15 DIAGNOSIS — M1990 Unspecified osteoarthritis, unspecified site: Secondary | ICD-10-CM | POA: Diagnosis not present

## 2015-08-15 DIAGNOSIS — F329 Major depressive disorder, single episode, unspecified: Secondary | ICD-10-CM | POA: Insufficient documentation

## 2015-08-15 DIAGNOSIS — F419 Anxiety disorder, unspecified: Secondary | ICD-10-CM | POA: Insufficient documentation

## 2015-08-15 DIAGNOSIS — M549 Dorsalgia, unspecified: Secondary | ICD-10-CM

## 2015-08-15 MED ORDER — PREDNISONE 50 MG PO TABS
60.0000 mg | ORAL_TABLET | Freq: Once | ORAL | Status: AC
  Administered 2015-08-15: 60 mg via ORAL
  Filled 2015-08-15 (×2): qty 1

## 2015-08-15 MED ORDER — HYDROCODONE-ACETAMINOPHEN 5-325 MG PO TABS
2.0000 | ORAL_TABLET | Freq: Once | ORAL | Status: AC
  Administered 2015-08-15: 2 via ORAL
  Filled 2015-08-15: qty 2

## 2015-08-15 MED ORDER — HYDROCODONE-ACETAMINOPHEN 5-325 MG PO TABS: 1.0000 | ORAL_TABLET | ORAL | Status: DC | PRN

## 2015-08-15 MED ORDER — PREDNISONE 20 MG PO TABS: 40.0000 mg | ORAL_TABLET | Freq: Every day | ORAL | Status: DC

## 2015-08-15 NOTE — ED Notes (Signed)
Nurse first-pt with slowing, limping gait-refused need for w/c

## 2015-08-15 NOTE — ED Provider Notes (Signed)
CSN: [id number]     Arrival date & time 08/15/15  1958 History   First MD Initiated Contact with Patient 08/15/15 2239     Chief Complaint  Patient presents with  . Back Pain     (Consider location/radiation/quality/duration/timing/severity/associated sxs/prior Treatment) Patient is a 54 y.o. female presenting with back pain. The history is provided by the patient and medical records.  Back Pain     54 year old female with history of GERD, anxiety, asthma, depression, fibromyalgia, degenerative disc disease, COPD, presenting to the ED for back pain. Patient states she suffered a lumbar compression fracture in February 2016 after slipping on the ice. She states she has been seeing [place], Dr. Erlinda Hong, however does not feel that her overall condition is improving.  States she has had intermittent flares of back pain/spasms since initial injury.  Current flare began on Saturday, 2 days ago.  She states pain begins in her right lower back and radiates down posterior right leg but does not descend past the knee.  She reports associated paresthesias and a "numb" feeling in her toes.  She denies leg weakness.  No recent fall or injury.  No fever, chills, sweats.  No bowel or bladder incontinence.  Patient has been taking 800mg  motrin TID and flexeril without relief. Called ortho office today to make appt, however cannot see them until later this week and could not wait that long due to pain.    Past Medical History  Diagnosis Date  . GERD (gastroesophageal reflux disease)   . Thyroid nodule   . Anxiety 11/11/11  . Asthma   . Herpes   . Seasonal allergies   . Arthritis   . Depression   . Fibromyalgia   . IBS (irritable bowel syndrome)   . DDD (degenerative disc disease)   . Bursitis     right shoulder  . Heart murmur     as a child per pt  . COPD (chronic obstructive pulmonary disease)    Past Surgical History  Procedure Laterality Date  . Hernia repair      Hiatal   .  Cholecystectomy    . Tubal ligation  1983  . Hiatal hernia repair     Family History  Problem Relation Age of Onset  . Lung cancer Mother   . Arthritis Mother     Rheumatoid  . Bursitis Mother   . Heart attack Father 85    Died of MI  . Obesity Father   . Fibromyalgia Sister   . Asthma Mother   . Asthma Child    Social History  Substance Use Topics  . Smoking status: Current Every Day Smoker -- 0.25 packs/day for 27 years    Types: Cigarettes  . Smokeless tobacco: Never Used  . Alcohol Use: No   OB History    No data available     Review of Systems  Musculoskeletal: Positive for back pain.  All other systems reviewed and are negative.     Allergies  Cymbalta; Levaquin; Lyrica; Shellfish allergy; Sulfamethoxazole; and Codeine  Home Medications   Prior to Admission medications   Medication Sig Start Date End Date Taking? Authorizing Provider  albuterol (PROVENTIL HFA;VENTOLIN HFA) 108 (90 BASE) MCG/ACT inhaler Inhale 2 puffs into the lungs every 6 (six) hours as needed. Shortness of breath   Yes Historical Provider, MD  albuterol (PROVENTIL) (2.5 MG/3ML) 0.083% nebulizer solution Take 2.5 mg by nebulization every 6 (six) hours as needed. Shortness of breath   Yes Historical  Provider, MD  aspirin 81 MG tablet Take 81 mg by mouth daily.   Yes Historical Provider, MD  cyclobenzaprine (FLEXERIL) 5 MG tablet Take 1 tablet (5 mg total) by mouth 3 (three) times daily as needed for muscle spasms. 01/11/15  Yes Noland Fordyce, PA-C  ibuprofen (ADVIL,MOTRIN) 200 MG tablet Take 2 tablets (400 mg total) by mouth every 6 (six) hours as needed. Patient taking differently: Take 800 mg by mouth every 6 (six) hours as needed for moderate pain.  01/11/15  Yes Noland Fordyce, PA-C  pantoprazole (PROTONIX) 20 MG tablet Take 2 tablets (40 mg total) by mouth 2 (two) times daily. Patient taking differently: Take 20 mg by mouth daily.  04/07/13  Yes Jessica D Zehr, PA-C  sertraline (ZOLOFT) 50 MG  tablet Take 50 mg by mouth daily.   Yes Historical Provider, MD  Aspirin-Acetaminophen-Caffeine 260-130-16 MG TABS Take 2 packets by mouth every 4 (four) hours as needed (pain).    Historical Provider, MD  cetirizine (ZYRTEC) 10 MG tablet Take 10 mg by mouth daily.    Historical Provider, MD  fluticasone (FLONASE) 50 MCG/ACT nasal spray Place 1 spray into both nostrils daily.    Historical Provider, MD  HYDROcodone-acetaminophen (NORCO/VICODIN) 5-325 MG per tablet Take 2 tablets by mouth every 4 (four) hours as needed. 06/14/15   Linton Flemings, MD  LORazepam (ATIVAN) 1 MG tablet Take 0.5 mg by mouth every 8 (eight) hours as needed (anxiety).  03/16/11   Alycia Rossetti, MD  methocarbamol (ROBAXIN) 750 MG tablet Take 1 tablet (750 mg total) by mouth every 8 (eight) hours as needed for muscle spasms. 06/14/15   Linton Flemings, MD  predniSONE (DELTASONE) 20 MG tablet 3 Tabs PO Days 1-3, then 2 tabs PO Days 4-6, then 1 tab PO Day 7-9, then Half Tab PO Day 10-12 Patient not taking: Reported on 06/14/2015 03/23/15   Junius Creamer, NP  simethicone (GAS-X) 80 MG chewable tablet Chew 80 mg by mouth every 6 (six) hours as needed for flatulence.    Historical Provider, MD   BP 108/70 mmHg  Pulse 63  Temp(Src) 98 F (36.7 C) (Oral)  Resp 20  Ht 5\' 3"  (1.6 m)  Wt 168 lb (76.204 kg)  BMI 29.77 kg/m2  SpO2 99%   Physical Exam  Constitutional: She is oriented to person, place, and time. She appears well-developed and well-nourished. No distress.  HENT:  Head: Normocephalic and atraumatic.  Mouth/Throat: Oropharynx is clear and moist.  Eyes: Conjunctivae and EOM are normal. Pupils are equal, round, and reactive to light.  Neck: Normal range of motion. Neck supple.  Cardiovascular: Normal rate, regular rhythm and normal heart sounds.   Pulmonary/Chest: Effort normal and breath sounds normal. No respiratory distress. She has no wheezes.  Abdominal: Soft. Bowel sounds are normal. There is no tenderness. There is no  guarding.  Musculoskeletal: Normal range of motion. She exhibits no edema.       Lumbar back: She exhibits tenderness, bony tenderness and pain. She exhibits normal range of motion, no swelling, no edema, no deformity, no laceration, no spasm and normal pulse.       Back:  Tenderness of right SI joint without noted deformity; no overlying skin changes or back or legs; + SLR on right, - on left; normal strength and sensation of RLE; DP pulses intact bilaterally  Neurological: She is alert and oriented to person, place, and time.  Skin: Skin is warm and dry. She is not diaphoretic.  Psychiatric:  She has a normal mood and affect.  Nursing note and vitals reviewed.   ED Course  Procedures (including critical care time) Labs Review Labs Reviewed - No data to display  Imaging Review No results found.    EKG Interpretation None      MDM   Final diagnoses:  Back pain, unspecified location    54 year old female here with low back pain. This appears to be an acute on chronic problem. Patient is afebrile, nontoxic. She does appear uncomfortable. She has tenderness of her right SI joint without deformity. Positive straight leg raise on right. Neurologically intact without deficits to suggest cauda equina. Her signs and symptoms are consistent with her history of sciatica. Will prescribe short supply pain medication and prednisone taper. Patient will follow-up with her PCP.  Discussed plan with patient, he/she acknowledged understanding and agreed with plan of care.  Return precautions given for new or worsening symptoms.   Larene Pickett, PA-C 08/16/15 0002  Malvin Johns, MD 08/16/15 [phone]

## 2015-08-15 NOTE — Discharge Instructions (Signed)
Take the prescribed medication as directed.  Avoid taking high dose motrin with prednisone-- may cause GI upset. Follow-up with your primary care physician. Return to the ED for new or worsening symptoms.

## 2015-08-15 NOTE — ED Notes (Signed)
Pt reports she woke up with low back pain on Saturday. No injury. States pain radiates down right leg. Toes numb. Pt has been taking flexeril and ibuprofen without relief. Called ortho doctor and can't be seen until Thursday

## 2015-08-21 ENCOUNTER — Emergency Department (HOSPITAL_COMMUNITY): Payer: Medicaid Other

## 2015-08-21 ENCOUNTER — Emergency Department (HOSPITAL_COMMUNITY)
Admission: EM | Admit: 2015-08-21 | Discharge: 2015-08-21 | Disposition: A | Discharge status: Home / Self Care | Payer: Medicaid Other | Attending: Emergency Medicine | Admitting: Emergency Medicine

## 2015-08-21 ENCOUNTER — Encounter (HOSPITAL_COMMUNITY): Payer: Self-pay

## 2015-08-21 DIAGNOSIS — Z7951 Long term (current) use of inhaled steroids: Secondary | ICD-10-CM | POA: Diagnosis not present

## 2015-08-21 DIAGNOSIS — K219 Gastro-esophageal reflux disease without esophagitis: Secondary | ICD-10-CM | POA: Diagnosis not present

## 2015-08-21 DIAGNOSIS — Z8639 Personal history of other endocrine, nutritional and metabolic disease: Secondary | ICD-10-CM | POA: Diagnosis not present

## 2015-08-21 DIAGNOSIS — Z8619 Personal history of other infectious and parasitic diseases: Secondary | ICD-10-CM | POA: Diagnosis not present

## 2015-08-21 DIAGNOSIS — Z7982 Long term (current) use of aspirin: Secondary | ICD-10-CM | POA: Insufficient documentation

## 2015-08-21 DIAGNOSIS — Z720 Tobacco use: Secondary | ICD-10-CM | POA: Insufficient documentation

## 2015-08-21 DIAGNOSIS — E669 Obesity, unspecified: Secondary | ICD-10-CM | POA: Diagnosis not present

## 2015-08-21 DIAGNOSIS — M7502 Adhesive capsulitis of left shoulder: Secondary | ICD-10-CM | POA: Diagnosis not present

## 2015-08-21 DIAGNOSIS — F329 Major depressive disorder, single episode, unspecified: Secondary | ICD-10-CM | POA: Insufficient documentation

## 2015-08-21 DIAGNOSIS — F419 Anxiety disorder, unspecified: Secondary | ICD-10-CM | POA: Diagnosis not present

## 2015-08-21 DIAGNOSIS — Z79899 Other long term (current) drug therapy: Secondary | ICD-10-CM | POA: Insufficient documentation

## 2015-08-21 DIAGNOSIS — Z87828 Personal history of other (healed) physical injury and trauma: Secondary | ICD-10-CM | POA: Insufficient documentation

## 2015-08-21 DIAGNOSIS — M25512 Pain in left shoulder: Secondary | ICD-10-CM | POA: Diagnosis present

## 2015-08-21 DIAGNOSIS — M25551 Pain in right hip: Secondary | ICD-10-CM | POA: Diagnosis not present

## 2015-08-21 DIAGNOSIS — R011 Cardiac murmur, unspecified: Secondary | ICD-10-CM | POA: Insufficient documentation

## 2015-08-21 DIAGNOSIS — M1990 Unspecified osteoarthritis, unspecified site: Secondary | ICD-10-CM | POA: Diagnosis not present

## 2015-08-21 DIAGNOSIS — J449 Chronic obstructive pulmonary disease, unspecified: Secondary | ICD-10-CM | POA: Diagnosis not present

## 2015-08-21 MED ORDER — KETOROLAC TROMETHAMINE 60 MG/2ML IM SOLN
60.0000 mg | Freq: Once | INTRAMUSCULAR | Status: AC
  Administered 2015-08-21: 60 mg via INTRAMUSCULAR
  Filled 2015-08-21: qty 2

## 2015-08-21 NOTE — ED Notes (Signed)
Pt reports she was seen [facility] on 9/12 for back pain and was told she had sciatica. Pt fell on ice on Feb 15 and since then she has been having back pain, right hip pain and now she states her left shoulder is painful, she states she can barely move it. She thinks it is her rotator cuff giving her this problem.  She feels like "something is out of place in my right hip."

## 2015-08-21 NOTE — ED Provider Notes (Signed)
CSN: [id number]     Arrival date & time 08/21/15  1434 History   First MD Initiated Contact with Patient 08/21/15 1530     Chief Complaint  Patient presents with  . Hip Pain  . Shoulder Pain     (Consider location/radiation/quality/duration/timing/severity/associated sxs/prior Treatment) HPI   53 year old female with history of fibromyalgia, anxiety, depression, presenting to the ED with multiple complaints. Patient reports she fell on ice on 2015 consistent and she has been having back pain right hip pain and now is having left shoulder pain. Pain is worsened with movement especially in the left shoulder. Patient feels she may have a rotator cuff problem. She also felt that "something is out of place in my right hip". Patient states this morning when she woke up her left shoulder was frozen with worsening pain with movement. States she was unable to brush her hair because of her shoulder pain. Describe pain as sharp and achy sensation throughout entire shoulder with tingling sensation at tips of fingers. She was seen in the ED on 9/12 for back pain and was told that she has sciatica. She was also seen on 9/19 for similar complaint and was giving a short course of pain medication. She returns today complaining of worsening pain. She denies having fever, chills, focal numbness or weakness. She denies having any rash. She denies any recent trauma.  Past Medical History  Diagnosis Date  . GERD (gastroesophageal reflux disease)   . Thyroid nodule   . Anxiety 11/11/11  . Asthma   . Herpes   . Seasonal allergies   . Arthritis   . Depression   . Fibromyalgia   . IBS (irritable bowel syndrome)   . DDD (degenerative disc disease)   . Bursitis     right shoulder  . Heart murmur     as a child per pt  . COPD (chronic obstructive pulmonary disease)    Past Surgical History  Procedure Laterality Date  . Hernia repair      Hiatal   . Cholecystectomy    . Tubal ligation  1983  . Hiatal  hernia repair     Family History  Problem Relation Age of Onset  . Lung cancer Mother   . Arthritis Mother     Rheumatoid  . Bursitis Mother   . Heart attack Father 63    Died of MI  . Obesity Father   . Fibromyalgia Sister   . Asthma Mother   . Asthma Child    Social History  Substance Use Topics  . Smoking status: Current Every Day Smoker -- 0.25 packs/day for 27 years    Types: Cigarettes  . Smokeless tobacco: Never Used  . Alcohol Use: No   OB History    No data available     Review of Systems  All other systems reviewed and are negative.     Allergies  Cymbalta; Levaquin; Lyrica; Shellfish allergy; Sulfamethoxazole; and Codeine  Home Medications   Prior to Admission medications   Medication Sig Start Date End Date Taking? Authorizing Provider  albuterol (PROVENTIL HFA;VENTOLIN HFA) 108 (90 BASE) MCG/ACT inhaler Inhale 2 puffs into the lungs every 6 (six) hours as needed. Shortness of breath    Historical Provider, MD  albuterol (PROVENTIL) (2.5 MG/3ML) 0.083% nebulizer solution Take 2.5 mg by nebulization every 6 (six) hours as needed. Shortness of breath    Historical Provider, MD  aspirin 81 MG tablet Take 81 mg by mouth daily.    Historical  Provider, MD  Aspirin-Acetaminophen-Caffeine 260-130-16 MG TABS Take 2 packets by mouth every 4 (four) hours as needed (pain).    Historical Provider, MD  cetirizine (ZYRTEC) 10 MG tablet Take 10 mg by mouth daily.    Historical Provider, MD  cyclobenzaprine (FLEXERIL) 5 MG tablet Take 1 tablet (5 mg total) by mouth 3 (three) times daily as needed for muscle spasms. 01/11/15   Noland Fordyce, PA-C  fluticasone (FLONASE) 50 MCG/ACT nasal spray Place 1 spray into both nostrils daily.    Historical Provider, MD  HYDROcodone-acetaminophen (NORCO/VICODIN) 5-325 MG per tablet Take 1 tablet by mouth every 4 (four) hours as needed. 08/15/15   Larene Pickett, PA-C  ibuprofen (ADVIL,MOTRIN) 200 MG tablet Take 2 tablets (400 mg total) by  mouth every 6 (six) hours as needed. Patient taking differently: Take 800 mg by mouth every 6 (six) hours as needed for moderate pain.  01/11/15   Noland Fordyce, PA-C  LORazepam (ATIVAN) 1 MG tablet Take 0.5 mg by mouth every 8 (eight) hours as needed (anxiety).  03/16/11   Alycia Rossetti, MD  methocarbamol (ROBAXIN) 750 MG tablet Take 1 tablet (750 mg total) by mouth every 8 (eight) hours as needed for muscle spasms. 06/14/15   Linton Flemings, MD  pantoprazole (PROTONIX) 20 MG tablet Take 2 tablets (40 mg total) by mouth 2 (two) times daily. Patient taking differently: Take 20 mg by mouth daily.  04/07/13   Jessica D Zehr, PA-C  predniSONE (DELTASONE) 20 MG tablet Take 2 tablets (40 mg total) by mouth daily. Take 40 mg by mouth daily for 3 days, then 20mg  by mouth daily for 3 days, then 10mg  daily for 3 days 08/15/15   Larene Pickett, PA-C  sertraline (ZOLOFT) 50 MG tablet Take 50 mg by mouth daily.    Historical Provider, MD  simethicone (GAS-X) 80 MG chewable tablet Chew 80 mg by mouth every 6 (six) hours as needed for flatulence.    Historical Provider, MD   BP 104/82 mmHg  Pulse 92  Temp(Src) 97.6 F (36.4 C) (Oral)  Resp 18  Ht 5\' 3"  (1.6 m)  Wt 168 lb (76.204 kg)  BMI 29.77 kg/m2  SpO2 93% Physical Exam  Constitutional: She is oriented to person, place, and time. She appears well-developed and well-nourished. No distress.  Obese Caucasian female appears uncomfortable tearful.  HENT:  Head: Atraumatic.  Eyes: Conjunctivae are normal.  Neck: Neck supple.  Cardiovascular: Intact distal pulses.   Musculoskeletal: She exhibits tenderness (left shoulder with decreased range of motion secondary to pain, no gross deformity noted. Tenderness throughout. Right hip with diffuse tenderness even to slight palpation perform range of motion. No evidence of dislocation.).  Neurological: She is alert and oriented to person, place, and time.  Patellar deep tendon reflex intact bilaterally. Patient able to  ambulate.  Skin: No rash noted.  Psychiatric: She has a normal mood and affect.  Nursing note and vitals reviewed.   ED Course  Procedures (including critical care time)  Patient here with acute on chronic pain to her left shoulder and right hip. Patient felt that she may have dislocated her left shoulder because she is unable to move it. She does have decreased range of motion both with active and passive movement to her left shoulder but no evidence to suggest shoulder dislocation. Will obtain x-ray to confirm. She also mentioned pain in her right hip but she maintains normal range of motion. No evidence to suggest septic arthritis. She has  been seen multiple times for these conditions. She has had numerous imaging including MRIs in the past without acute concerning finding. At this time I felt that patient would need to be reevaluated by her orthopedist, I'll find a different provider for management of her chronic condition. I encouraged patient to follow-up with her regular doctor to be seen by a pain specialist for her chronic condition. I do not think additional narcotic pain medication is indicated at this time.  Pt request a referral to a different orthopedist as she does not feel that her current orthopedist is helpful.  Referral to Dr. Alvan Dame, who is oncall.    Labs Review Labs Reviewed - No data to display  Imaging Review Dg Shoulder Left  08/21/2015   CLINICAL DATA:  Left shoulder pain with limited range of motion since falling 7 months ago. No reported recent injury. Initial encounter.  EXAM: LEFT SHOULDER - 2+ VIEW  COMPARISON:  MRI 03/08/2013.  FINDINGS: Only two views (AP and Y views) were obtained. The bones are demineralized. There is no evidence of acute fracture or dislocation. There are mild glenohumeral and acromioclavicular degenerative changes.  IMPRESSION: No acute findings demonstrated on two view examination. Osteopenia with mild degenerative changes.   Electronically  Signed   By: Richardean Sale M.D.   On: 08/21/2015 16:35   I have personally reviewed and evaluated these images and lab results as part of my medical decision-making.   EKG Interpretation None      MDM   Final diagnoses:  Capsulitis, adhesive shoulder, left  Right hip pain    BP 104/82 mmHg  Pulse 92  Temp(Src) 97.6 F (36.4 C) (Oral)  Resp 18  Ht 5\' 3"  (1.6 m)  Wt 168 lb (76.204 kg)  BMI 29.77 kg/m2  SpO2 93%     Domenic Moras, PA-C 08/21/15 [place], MD 08/23/15 1119

## 2015-08-21 NOTE — ED Notes (Signed)
Patient transported to X-ray 

## 2015-08-21 NOTE — Discharge Instructions (Signed)
Adhesive Capsulitis Sometimes the shoulder becomes stiff and is painful to move. Some people say it feels as if the shoulder is frozen in place. Because of this, the condition is called "frozen shoulder." Its medical name is adhesive capsulitis.  The shoulder joint is made up of strong connective tissue that attaches the ball of the humerus to the shallow shoulder socket. This strong connective tissue is called the joint capsule. This tissue can become stiff and swollen. That is when adhesive capsulitis sets in. CAUSES  It is not always clear just what the cause adhesive capsulitis. Possibilities include:  Injury to the shoulder joint.  Strain. This is a repetitive injury brought about by overuse.  Lack of use. Perhaps your arm or hand was otherwise injured. It might have been in a sling for awhile. Or perhaps you were not using it to avoid pain.  Referred pain. This is a sort of trick the body plays. You feel pain in the shoulder. But, the pain actually comes from an injury somewhere else in the body.  Long-standing health problems. Several diseases can cause adhesive capsulitis. They include diabetes, heart disease, stroke, thyroid problems, rheumatoid arthritis and lung disease.  Being a women older than 40. Anyone can develop adhesive capsulitis but it is most common in women in this age group. SYMPTOMS   Pain.  It occurs when the arm is moved.  Parts of the shoulder might hurt if they are touched.  Pain is worse at night or when resting.  Soreness. It might not be strong enough to be called pain. But, the shoulder aches.  The shoulder does not move freely.  Muscle spasms.  Trouble sleeping because of shoulder ache or pain. DIAGNOSIS  To decide if you have adhesive capsulitis, your healthcare provider will probably:  Ask about symptoms you have noticed.  Ask about your history of joint pain and anything that might have caused the pain.  Ask about your overall  health.  Use hands to feel your shoulder and neck.  Ask you to move your shoulder in specific directions. This may indicate the origin of the pain.  Order imaging tests; pictures of the shoulder. They help pinpoint the source of the problem. An X-ray might be used. For more detail, an MRI is often used. An MRI details the tendons, muscles and ligaments as well as the joint. TREATMENT  Adhesive capsulitis can be treated several ways. Most treatments can be done in a clinic or in your healthcare provider's office. Be sure to discuss the different options with your caregiver. They include:  Physical therapy. You will work on specific exercises to get your shoulder moving again. The exercises usually involve stretching. A physical therapist (a caregiver with special training) can show you what to do and what not to do. The exercises will need to be done daily.  Medication.  Over-the-counter medicines may relieve pain and inflammation (the body's way of reacting to injury or infection).  Corticosteroids. These are stronger drugs to reduce pain and inflammation. They are given by injection (shots) into the shoulder joint. Frequent treatment is not recommended.  Muscle relaxants. Medication may be prescribed to ease muscle spasms.  Treatment of underlying conditions. This means treating another condition that is causing your shoulder problem. This might be a rotator cuff (tendon) problem  Shoulder manipulation. The shoulder will be moved by your healthcare provider. You would be under general anesthesia (given a drug that puts you to sleep). You would not feel anything. Sometimes   the joint will be injected with salt water (saline) at high pressure to break down internal scarring in the joint capsule.  Surgery. This is rarely needed. It may be suggested in advanced cases after all other treatment has failed. PROGNOSIS  In time, most people recover from adhesive capsulitis. Sometimes, however, the  pain goes away but full movement of the shoulder does not return.  HOME CARE INSTRUCTIONS   Take any pain medications recommended by your healthcare provider. Follow the directions carefully.  If you have physical therapy, follow through with the therapist's suggestions. Be sure you understand the exercises you will be doing. You should understand:  How often the exercises should be done.  How many times each exercise should be repeated.  How long they should be done.  What other activities you should do, or not do.  That you should warm up before doing any exercise. Just 5 to 10 minutes will help. Small, gentle movements should get your shoulder ready for more.  Avoid high-demand exercise that involves your shoulder such as throwing. This type of exercise can make pain worse.  Consider using cold packs. Cold may ease swelling and pain. Ask your healthcare provider if a cold pack might help you. If so, get directions on how and when to use them. SEEK MEDICAL CARE IF:   You have any questions about your medications.  Your pain continues to increase. Document Released: 09/09/2009 Document Revised: 02/04/2012 Document Reviewed: 09/09/2009 [facility] Patient Information 2015 [place], [place]. This information is not intended to replace advice given to you by your health care provider. Make sure you discuss any questions you have with your health care provider.  Arthralgia Your caregiver has diagnosed you as suffering from an arthralgia. Arthralgia means there is pain in a joint. This can come from many reasons including:  Bruising the joint which causes soreness (inflammation) in the joint.  Wear and tear on the joints which occur as we grow older (osteoarthritis).  Overusing the joint.  Various forms of arthritis.  Infections of the joint. Regardless of the cause of pain in your joint, most of these different pains respond to anti-inflammatory drugs and rest. The exception to this  is when a joint is infected, and these cases are treated with antibiotics, if it is a bacterial infection. HOME CARE INSTRUCTIONS   Rest the injured area for as long as directed by your caregiver. Then slowly start using the joint as directed by your caregiver and as the pain allows. Crutches as directed may be useful if the ankles, knees or hips are involved. If the knee was splinted or casted, continue use and care as directed. If an stretchy or elastic wrapping bandage has been applied today, it should be removed and re-applied every 3 to 4 hours. It should not be applied tightly, but firmly enough to keep swelling down. Watch toes and feet for swelling, bluish discoloration, coldness, numbness or excessive pain. If any of these problems (symptoms) occur, remove the ace bandage and re-apply more loosely. If these symptoms persist, contact your caregiver or return to this location.  For the first 24 hours, keep the injured extremity elevated on pillows while lying down.  Apply ice for 15-20 minutes to the sore joint every couple hours while awake for the first half day. Then 03-04 times per day for the first 48 hours. Put the ice in a plastic bag and place a towel between the bag of ice and your skin.  Wear any splinting,  casting, elastic bandage applications, or slings as instructed.  Only take over-the-counter or prescription medicines for pain, discomfort, or fever as directed by your caregiver. Do not use aspirin immediately after the injury unless instructed by your physician. Aspirin can cause increased bleeding and bruising of the tissues.  If you were given crutches, continue to use them as instructed and do not resume weight bearing on the sore joint until instructed. Persistent pain and inability to use the sore joint as directed for more than 2 to 3 days are warning signs indicating that you should see a caregiver for a follow-up visit as soon as possible. Initially, a hairline fracture  (break in bone) may not be evident on X-rays. Persistent pain and swelling indicate that further evaluation, non-weight bearing or use of the joint (use of crutches or slings as instructed), or further X-rays are indicated. X-rays may sometimes not show a small fracture until a week or 10 days later. Make a follow-up appointment with your own caregiver or one to whom we have referred you. A radiologist (specialist in reading X-rays) may read your X-rays. Make sure you know how you are to obtain your X-ray results. Do not assume everything is normal if you do not hear from [place]. SEEK MEDICAL CARE IF: Bruising, swelling, or pain increases. SEEK IMMEDIATE MEDICAL CARE IF:   Your fingers or toes are numb or blue.  The pain is not responding to medications and continues to stay the same or get worse.  The pain in your joint becomes severe.  You develop a fever over 102 F (38.9 C).  It becomes impossible to move or use the joint. MAKE SURE YOU:   Understand these instructions.  Will watch your condition.  Will get help right away if you are not doing well or get worse. Document Released: 11/12/2005 Document Revised: 02/04/2012 Document Reviewed: 06/30/2008 [facility] Patient Information 2015 [place], [place]. This information is not intended to replace advice given to you by your health care provider. Make sure you discuss any questions you have with your health care provider.

## 2015-09-05 DIAGNOSIS — Z720 Tobacco use: Secondary | ICD-10-CM | POA: Insufficient documentation

## 2015-09-08 ENCOUNTER — Emergency Department (HOSPITAL_COMMUNITY)
Admission: EM | Admit: 2015-09-08 | Discharge: 2015-09-08 | Disposition: A | Discharge status: Home / Self Care | Payer: Medicaid Other | Attending: Emergency Medicine | Admitting: Emergency Medicine

## 2015-09-08 ENCOUNTER — Emergency Department (HOSPITAL_COMMUNITY): Payer: Medicaid Other

## 2015-09-08 ENCOUNTER — Encounter (HOSPITAL_COMMUNITY): Payer: Self-pay | Admitting: Emergency Medicine

## 2015-09-08 DIAGNOSIS — M1990 Unspecified osteoarthritis, unspecified site: Secondary | ICD-10-CM | POA: Diagnosis not present

## 2015-09-08 DIAGNOSIS — Z720 Tobacco use: Secondary | ICD-10-CM | POA: Diagnosis not present

## 2015-09-08 DIAGNOSIS — Z8619 Personal history of other infectious and parasitic diseases: Secondary | ICD-10-CM | POA: Diagnosis not present

## 2015-09-08 DIAGNOSIS — Z79899 Other long term (current) drug therapy: Secondary | ICD-10-CM | POA: Insufficient documentation

## 2015-09-08 DIAGNOSIS — R011 Cardiac murmur, unspecified: Secondary | ICD-10-CM | POA: Diagnosis not present

## 2015-09-08 DIAGNOSIS — J441 Chronic obstructive pulmonary disease with (acute) exacerbation: Secondary | ICD-10-CM | POA: Insufficient documentation

## 2015-09-08 DIAGNOSIS — F329 Major depressive disorder, single episode, unspecified: Secondary | ICD-10-CM | POA: Insufficient documentation

## 2015-09-08 DIAGNOSIS — Z7982 Long term (current) use of aspirin: Secondary | ICD-10-CM | POA: Diagnosis not present

## 2015-09-08 DIAGNOSIS — K219 Gastro-esophageal reflux disease without esophagitis: Secondary | ICD-10-CM | POA: Diagnosis not present

## 2015-09-08 DIAGNOSIS — R0602 Shortness of breath: Secondary | ICD-10-CM | POA: Diagnosis present

## 2015-09-08 DIAGNOSIS — Z8639 Personal history of other endocrine, nutritional and metabolic disease: Secondary | ICD-10-CM | POA: Insufficient documentation

## 2015-09-08 DIAGNOSIS — F419 Anxiety disorder, unspecified: Secondary | ICD-10-CM | POA: Diagnosis not present

## 2015-09-08 DIAGNOSIS — M797 Fibromyalgia: Secondary | ICD-10-CM | POA: Insufficient documentation

## 2015-09-08 LAB — CBC
HCT: 47.5 % — ABNORMAL HIGH (ref 36.0–46.0)
Hemoglobin: 16 g/dL — ABNORMAL HIGH (ref 12.0–15.0)
MCH: 31.7 pg (ref 26.0–34.0)
MCHC: 33.7 g/dL (ref 30.0–36.0)
MCV: 94.1 fL (ref 78.0–100.0)
Platelets: 270 10*3/uL (ref 150–400)
RBC: 5.05 MIL/uL (ref 3.87–5.11)
RDW: 14.2 % (ref 11.5–15.5)
WBC: 12.8 10*3/uL — ABNORMAL HIGH (ref 4.0–10.5)

## 2015-09-08 LAB — BASIC METABOLIC PANEL
Anion gap: 7 (ref 5–15)
BUN: 8 mg/dL (ref 6–20)
CO2: 23 mmol/L (ref 22–32)
Calcium: 8.8 mg/dL — ABNORMAL LOW (ref 8.9–10.3)
Chloride: 109 mmol/L (ref 101–111)
Creatinine, Ser: 0.62 mg/dL (ref 0.44–1.00)
GFR calc Af Amer: >60 mL/min (ref >60)
GFR calc non Af Amer: >60 mL/min (ref >60)
Glucose, Bld: 114 mg/dL — ABNORMAL HIGH (ref 65–99)
Potassium: 4.4 mmol/L (ref 3.5–5.1)
Sodium: 139 mmol/L (ref 135–145)

## 2015-09-08 LAB — I-STAT TROPONIN, ED: Troponin i, poc: 0 ng/mL (ref 0.00–0.08)

## 2015-09-08 MED ORDER — AZITHROMYCIN 250 MG PO TABS: 250.0000 mg | ORAL_TABLET | Freq: Every day | ORAL | Status: DC

## 2015-09-08 MED ORDER — PREDNISONE 20 MG PO TABS: 40.0000 mg | ORAL_TABLET | Freq: Every day | ORAL | Status: DC

## 2015-09-08 MED ORDER — ALBUTEROL (5 MG/ML) CONTINUOUS INHALATION SOLN
10.0000 mg/h | INHALATION_SOLUTION | Freq: Once | RESPIRATORY_TRACT | Status: AC
  Administered 2015-09-08: 10 mg/h via RESPIRATORY_TRACT
  Filled 2015-09-08: qty 20

## 2015-09-08 MED ORDER — METHYLPREDNISOLONE SODIUM SUCC 125 MG IJ SOLR
125.0000 mg | Freq: Once | INTRAMUSCULAR | Status: AC
  Administered 2015-09-08: 125 mg via INTRAVENOUS
  Filled 2015-09-08: qty 2

## 2015-09-08 MED ORDER — IPRATROPIUM BROMIDE 0.02 % IN SOLN
0.5000 mg | Freq: Once | RESPIRATORY_TRACT | Status: AC
  Administered 2015-09-08: 0.5 mg via RESPIRATORY_TRACT
  Filled 2015-09-08: qty 2.5

## 2015-09-08 MED ORDER — ALBUTEROL SULFATE (2.5 MG/3ML) 0.083% IN NEBU: 5.0000 mg | INHALATION_SOLUTION | Freq: Once | RESPIRATORY_TRACT | Status: DC

## 2015-09-08 NOTE — ED Notes (Signed)
Dr. Billy Fischer informed.  Pt may have a drink.  Pt provided water.

## 2015-09-08 NOTE — ED Notes (Signed)
Pt reports she was seen by PCP on Tuesday for ear pain and URI. Pt has COPD and was given rx for amoxicillin. Pt reports SOB has gotten worse. SOB worse with movement. Pt able to speak in complete sentences, O2 sat 96% on RA. Pt also reports R side CP with movement.

## 2015-09-08 NOTE — ED Notes (Addendum)
Pt stated "I've been coughing for a week, been on abx and it's made my back and right chest hurt.  They told me to use my inhaler twice a day.  Pt is currently receiving neb tx.  Pt requesting something to drink.  Informed would speak to MD.

## 2015-09-08 NOTE — ED Provider Notes (Signed)
CSN: [id number]     Arrival date & time 09/08/15  1257 History   First MD Initiated Contact with Patient 09/08/15 1359     Chief Complaint  Patient presents with  . Shortness of Breath     (Consider location/radiation/quality/duration/timing/severity/associated sxs/prior Treatment) HPI   Sheri Rivera is a 54 y.o. female with PMH significant for COPD, fibromyalgia, seasonal allergies, and asthma who presents with worsening shortness of breath x 2 days.  Patient states she has had SOB since last week along with b/l ear pain, cough, sore throat, and rhinorrhea.  She saw her PCP on Tuesday who told her she had an URI and was prescribed Augmentin along with increasing her breathing treatments and inhaler to Q4H.  Patient states that she has been taking the Augmentin as well as her breathing treatments, and her SOB is worsening.  Endorses ride sided CP, nonproductive cough, fevers (highest 101), chills, diarrhea (since starting abx), abdominal pain and back pain from coughing.  Denies N/V or headaches.  Granddaughter was sick last week with same symptoms.    Past Medical History  Diagnosis Date  . GERD (gastroesophageal reflux disease)   . Thyroid nodule   . Anxiety 11/11/11  . Asthma   . Herpes   . Seasonal allergies   . Arthritis   . Depression   . Fibromyalgia   . IBS (irritable bowel syndrome)   . DDD (degenerative disc disease)   . Bursitis     right shoulder  . Heart murmur     as a child per pt  . COPD (chronic obstructive pulmonary disease) [facility])    Past Surgical History  Procedure Laterality Date  . Hernia repair      Hiatal   . Cholecystectomy    . Tubal ligation  1983  . Hiatal hernia repair     Family History  Problem Relation Age of Onset  . Lung cancer Mother   . Arthritis Mother     Rheumatoid  . Bursitis Mother   . Heart attack Father 64    Died of MI  . Obesity Father   . Fibromyalgia Sister   . Asthma Mother   . Asthma Child    Social History   Substance Use Topics  . Smoking status: Current Every Day Smoker -- 0.25 packs/day for 27 years    Types: Cigarettes  . Smokeless tobacco: Never Used  . Alcohol Use: No   OB History    No data available     Review of Systems All other systems negative unless otherwise stated in HPI    Allergies  Cymbalta; Levaquin; Lyrica; Shellfish allergy; Sulfamethoxazole; and Codeine  Home Medications   Prior to Admission medications   Medication Sig Start Date End Date Taking? Authorizing Provider  albuterol (PROVENTIL HFA;VENTOLIN HFA) 108 (90 BASE) MCG/ACT inhaler Inhale 2 puffs into the lungs every 6 (six) hours as needed. Shortness of breath   Yes Historical Provider, MD  albuterol (PROVENTIL) (2.5 MG/3ML) 0.083% nebulizer solution Take 2.5 mg by nebulization every 6 (six) hours as needed. Shortness of breath   Yes Historical Provider, MD  amoxicillin-clavulanate (AUGMENTIN) 875-125 MG tablet Take 1 tablet by mouth 2 (two) times daily. 09/06/15-09/13/15 09/06/15 09/16/15 Yes Historical Provider, MD  aspirin 81 MG tablet Take 81 mg by mouth daily.   Yes Historical Provider, MD  cyclobenzaprine (FLEXERIL) 5 MG tablet Take 1 tablet (5 mg total) by mouth 3 (three) times daily as needed for muscle spasms. 01/11/15  Yes  Noland Fordyce, PA-C  ibuprofen (ADVIL,MOTRIN) 200 MG tablet Take 2 tablets (400 mg total) by mouth every 6 (six) hours as needed. Patient taking differently: Take 800 mg by mouth every 6 (six) hours as needed for moderate pain.  01/11/15  Yes Noland Fordyce, PA-C  loratadine (CLARITIN) 10 MG tablet Take 10 mg by mouth daily.   Yes Historical Provider, MD  LORazepam (ATIVAN) 1 MG tablet Take 0.5 mg by mouth every 8 (eight) hours as needed (anxiety).  03/16/11  Yes Alycia Rossetti, MD  pantoprazole (PROTONIX) 20 MG tablet Take 2 tablets (40 mg total) by mouth 2 (two) times daily. Patient taking differently: Take 20 mg by mouth 2 (two) times daily as needed for heartburn.  04/07/13  Yes  Jessica D Zehr, PA-C  sertraline (ZOLOFT) 50 MG tablet Take 50 mg by mouth daily.   Yes Historical Provider, MD  simethicone (GAS-X) 80 MG chewable tablet Chew 80 mg by mouth every 6 (six) hours as needed for flatulence.   Yes Historical Provider, MD  HYDROcodone-acetaminophen (NORCO/VICODIN) 5-325 MG per tablet Take 1 tablet by mouth every 4 (four) hours as needed. Patient not taking: Reported on 09/08/2015 08/15/15   Larene Pickett, PA-C  methocarbamol (ROBAXIN) 750 MG tablet Take 1 tablet (750 mg total) by mouth every 8 (eight) hours as needed for muscle spasms. Patient not taking: Reported on 09/08/2015 06/14/15   Linton Flemings, MD  predniSONE (DELTASONE) 20 MG tablet Take 2 tablets (40 mg total) by mouth daily. Take 40 mg by mouth daily for 3 days, then 20mg  by mouth daily for 3 days, then 10mg  daily for 3 days Patient not taking: Reported on 09/08/2015 08/15/15   Larene Pickett, PA-C   BP 114/63 mmHg  Pulse 107  Temp(Src) 97.7 F (36.5 C) (Oral)  Resp 26  SpO2 91% Physical Exam  Constitutional: She is oriented to person, place, and time. She appears well-developed and well-nourished.  HENT:  Head: Normocephalic and atraumatic.  Mouth/Throat: Oropharynx is clear and moist.  Eyes: Conjunctivae are normal.  Neck: Normal range of motion. Neck supple.  Cardiovascular: Normal rate, regular rhythm and normal heart sounds.   No murmur heard. Pulmonary/Chest: Effort normal. No accessory muscle usage. No respiratory distress. She has decreased breath sounds. She has wheezes. She has no rales.  Abdominal: Soft. Bowel sounds are normal. She exhibits no distension. There is no tenderness.  Musculoskeletal: Normal range of motion.  Lymphadenopathy:    She has no cervical adenopathy.  Neurological: She is alert and oriented to person, place, and time.  Skin: Skin is warm and dry.  Psychiatric: She has a normal mood and affect. Her behavior is normal.    ED Course  Procedures (including critical  care time) Labs Review Labs Reviewed  BASIC METABOLIC PANEL - Abnormal; Notable for the following:    Glucose, Bld 114 (*)    Calcium 8.8 (*)    All other components within normal limits  CBC - Abnormal; Notable for the following:    WBC 12.8 (*)    Hemoglobin 16.0 (*)    HCT 47.5 (*)    All other components within normal limits  I-STAT TROPOININ, ED    Imaging Review Dg Chest 2 View  09/08/2015  CLINICAL DATA:  COPD, shortness of Breath EXAM: CHEST  2 VIEW COMPARISON:  07/31/2014 FINDINGS: Cardiomediastinal silhouette is stable. Hyperinflation again noted. Central mild bronchitic changes. Stable chronic mild interstitial prominence without pulmonary edema. No segmental infiltrate. IMPRESSION: Stable hyperinflation  and chronic interstitial prominence. Central mild bronchitic changes. No segmental infiltrate. Electronically Signed   By: Lahoma Crocker M.D.   On: 09/08/2015 13:30   I have personally reviewed and evaluated these images and lab results as part of my medical decision-making.   EKG Interpretation   Date/Time:  Thursday September 08 2015 15:27:15 EDT Ventricular Rate:  83 PR Interval:  136 QRS Duration: 98 QT Interval:  519 QTC Calculation: 610 R Axis:   95 Text Interpretation:  Sinus rhythm Borderline right axis deviation Low  voltage, precordial leads Prolonged QT interval no STEMI. no change  Confirmed by Johnney Killian, MD, Jeannie Done [phone]) on 09/08/2015 3:32:31 PM      MDM   Final diagnoses:  Shortness of breath    Patient with PMHx of COPD presents with worsening shortness of breath with URI symptoms.  VSS, 96% on RA, mild distress, non-toxic appearing.  Wheezing on exam.  Will begin CAT, solumedrol, and atrovent.  CXR shows bronchitic changes.  BMP and CBC unremarkable.  Mild leukocytosis of 12.8.  Doubt PNA.   Troponin negative, EKG shows NSR. Suspect COPD exacerbation and URI.   Upon reassessment, patient remains tachypneic, tachycardia, and wheezing.  Tachycardia  likely due to COPD exacerbation as well as CAT. Discussed with patient admission to the hospital, and patient refused.  Dr. Billy Fischer extensively discussed risks refusing admission.  After extensive discussion, patient continues to refuse admission.  Patient verbalizes understanding of risks, and states if her shortness of breath worsens she will return to the ED.  Will d/c home with azithromycin and prednisone.  Patient will stop Augmentin and continue Q4H breathing treatments.   Case has been discussed with and seen by Dr. Billy Fischer who agrees with the above plan for discharge.     Sheri Loan, PA-C 09/08/15 1622  Gareth Morgan, MD 09/08/15 [phone]

## 2015-09-08 NOTE — Discharge Instructions (Signed)

## 2015-09-09 NOTE — Progress Notes (Signed)
[facility] ED CM received a call from [place] at pt preferred pharmacy with an concern with the NPI number for prescribing ED provider Cm discussed this with [place] and provided the correct NPI # for the cosigning Provider  CM confirmed with [place] while on line that prescription cleared the pharmacy data base

## 2015-09-09 NOTE — Progress Notes (Signed)
Dr Billy Fischer at [facility] ED and is aware

## 2015-09-15 ENCOUNTER — Ambulatory Visit: Payer: Medicaid Other | Admitting: Internal Medicine

## 2015-10-07 ENCOUNTER — Emergency Department (HOSPITAL_COMMUNITY)
Admission: EM | Admit: 2015-10-07 | Discharge: 2015-10-08 | Disposition: A | Discharge status: Home / Self Care | Payer: Medicaid Other | Attending: Emergency Medicine | Admitting: Emergency Medicine

## 2015-10-07 ENCOUNTER — Encounter (HOSPITAL_COMMUNITY): Payer: Self-pay | Admitting: Emergency Medicine

## 2015-10-07 DIAGNOSIS — R011 Cardiac murmur, unspecified: Secondary | ICD-10-CM | POA: Diagnosis not present

## 2015-10-07 DIAGNOSIS — Z87440 Personal history of urinary (tract) infections: Secondary | ICD-10-CM | POA: Diagnosis not present

## 2015-10-07 DIAGNOSIS — F419 Anxiety disorder, unspecified: Secondary | ICD-10-CM | POA: Diagnosis not present

## 2015-10-07 DIAGNOSIS — Z7982 Long term (current) use of aspirin: Secondary | ICD-10-CM | POA: Insufficient documentation

## 2015-10-07 DIAGNOSIS — M1990 Unspecified osteoarthritis, unspecified site: Secondary | ICD-10-CM | POA: Insufficient documentation

## 2015-10-07 DIAGNOSIS — Z791 Long term (current) use of non-steroidal anti-inflammatories (NSAID): Secondary | ICD-10-CM | POA: Insufficient documentation

## 2015-10-07 DIAGNOSIS — M797 Fibromyalgia: Secondary | ICD-10-CM | POA: Diagnosis not present

## 2015-10-07 DIAGNOSIS — Z8619 Personal history of other infectious and parasitic diseases: Secondary | ICD-10-CM | POA: Diagnosis not present

## 2015-10-07 DIAGNOSIS — M549 Dorsalgia, unspecified: Secondary | ICD-10-CM

## 2015-10-07 DIAGNOSIS — K219 Gastro-esophageal reflux disease without esophagitis: Secondary | ICD-10-CM | POA: Diagnosis not present

## 2015-10-07 DIAGNOSIS — Z79899 Other long term (current) drug therapy: Secondary | ICD-10-CM | POA: Diagnosis not present

## 2015-10-07 DIAGNOSIS — J449 Chronic obstructive pulmonary disease, unspecified: Secondary | ICD-10-CM | POA: Insufficient documentation

## 2015-10-07 DIAGNOSIS — Z9889 Other specified postprocedural states: Secondary | ICD-10-CM | POA: Diagnosis not present

## 2015-10-07 DIAGNOSIS — F329 Major depressive disorder, single episode, unspecified: Secondary | ICD-10-CM | POA: Diagnosis not present

## 2015-10-07 DIAGNOSIS — R300 Dysuria: Secondary | ICD-10-CM | POA: Diagnosis not present

## 2015-10-07 DIAGNOSIS — M545 Low back pain, unspecified: Secondary | ICD-10-CM

## 2015-10-07 DIAGNOSIS — R109 Unspecified abdominal pain: Secondary | ICD-10-CM | POA: Diagnosis not present

## 2015-10-07 DIAGNOSIS — G8929 Other chronic pain: Secondary | ICD-10-CM | POA: Diagnosis not present

## 2015-10-07 DIAGNOSIS — Z9851 Tubal ligation status: Secondary | ICD-10-CM | POA: Diagnosis not present

## 2015-10-07 DIAGNOSIS — R339 Retention of urine, unspecified: Secondary | ICD-10-CM | POA: Diagnosis not present

## 2015-10-07 DIAGNOSIS — Z9049 Acquired absence of other specified parts of digestive tract: Secondary | ICD-10-CM | POA: Insufficient documentation

## 2015-10-07 HISTORY — DX: Other chronic pain: G89.29

## 2015-10-07 HISTORY — DX: Radiculopathy, lumbar region: M54.16

## 2015-10-07 HISTORY — DX: Pain in unspecified shoulder: M25.519

## 2015-10-07 HISTORY — DX: Cervicalgia: M54.2

## 2015-10-07 HISTORY — DX: Dorsalgia, unspecified: M54.9

## 2015-10-07 LAB — CBC
HCT: 45.9 % (ref 36.0–46.0)
Hemoglobin: 15.6 g/dL — ABNORMAL HIGH (ref 12.0–15.0)
MCH: 31.4 pg (ref 26.0–34.0)
MCHC: 34 g/dL (ref 30.0–36.0)
MCV: 92.4 fL (ref 78.0–100.0)
Platelets: 299 10*3/uL (ref 150–400)
RBC: 4.97 MIL/uL (ref 3.87–5.11)
RDW: 13.9 % (ref 11.5–15.5)
WBC: 13.8 10*3/uL — ABNORMAL HIGH (ref 4.0–10.5)

## 2015-10-07 NOTE — ED Notes (Signed)
Pt from home c/o right flank pain and dysuria since this morning. She reports she has on ly been able to urinate dribbles. She took ibuprofen at 2200 without relief.

## 2015-10-08 ENCOUNTER — Emergency Department (HOSPITAL_COMMUNITY): Payer: Medicaid Other

## 2015-10-08 ENCOUNTER — Encounter (HOSPITAL_COMMUNITY): Payer: Self-pay | Admitting: Emergency Medicine

## 2015-10-08 LAB — URINE MICROSCOPIC-ADD ON

## 2015-10-08 LAB — URINALYSIS, ROUTINE W REFLEX MICROSCOPIC
Bilirubin Urine: NEGATIVE
Glucose, UA: NEGATIVE mg/dL
Hgb urine dipstick: NEGATIVE
Ketones, ur: NEGATIVE mg/dL
Nitrite: NEGATIVE
Protein, ur: NEGATIVE mg/dL
Specific Gravity, Urine: 1.02 (ref 1.005–1.030)
Urobilinogen, UA: 0.2 mg/dL (ref 0.0–1.0)
pH: 6 (ref 5.0–8.0)

## 2015-10-08 LAB — BASIC METABOLIC PANEL
Anion gap: 6 (ref 5–15)
BUN: 8 mg/dL (ref 6–20)
CO2: 26 mmol/L (ref 22–32)
Calcium: 8.8 mg/dL — ABNORMAL LOW (ref 8.9–10.3)
Chloride: 108 mmol/L (ref 101–111)
Creatinine, Ser: 0.71 mg/dL (ref 0.44–1.00)
GFR calc Af Amer: >60 mL/min (ref >60)
GFR calc non Af Amer: >60 mL/min (ref >60)
Glucose, Bld: 122 mg/dL — ABNORMAL HIGH (ref 65–99)
Potassium: 3.9 mmol/L (ref 3.5–5.1)
Sodium: 140 mmol/L (ref 135–145)

## 2015-10-08 LAB — LACTIC ACID, PLASMA: Lactic Acid, Venous: 0.9 mmol/L (ref 0.5–2.0)

## 2015-10-08 MED ORDER — METHOCARBAMOL 500 MG PO TABS
1000.0000 mg | ORAL_TABLET | Freq: Four times a day (QID) | ORAL | Status: DC | PRN
Start: 2015-10-08 — End: 2016-03-14

## 2015-10-08 MED ORDER — HYDROCODONE-ACETAMINOPHEN 5-325 MG PO TABS
1.0000 | ORAL_TABLET | Freq: Once | ORAL | Status: AC
  Administered 2015-10-08: 1 via ORAL
  Filled 2015-10-08: qty 1

## 2015-10-08 MED ORDER — PHENAZOPYRIDINE HCL 100 MG PO TABS: 100.0000 mg | ORAL_TABLET | Freq: Three times a day (TID) | ORAL | Status: DC

## 2015-10-08 MED ORDER — HYDROCODONE-ACETAMINOPHEN 5-325 MG PO TABS: ORAL_TABLET | ORAL | Status: DC

## 2015-10-08 NOTE — ED Notes (Addendum)
Pt refuses IV insertion until IV medications are ordered.

## 2015-10-08 NOTE — ED Notes (Signed)
Pt. Refused lab draw. Notified RN,Christina.

## 2015-10-08 NOTE — ED Provider Notes (Signed)
CSN: [id number]     Arrival date & time 10/07/15  2306 History   First MD Initiated Contact with Patient 10/08/15 0025     Chief Complaint  Patient presents with  . Flank Pain  . Dysuria  . Urinary Retention      HPI  Pt was seen at [facility]. Per pt, c/o gradual onset and persistence of constant dysuria and urinary frequency since this morning. Pt states she "thinks I might have a UTI." Denies fevers, no abd pain, no N/V/D, no vaginal bleeding/discharge, no rash. Per pt, c/o gradual onset and persistence of constant acute flair of her chronic right sided low back "pain" for the past several days. Describes the pain as "aching."  Denies any change in her usual chronic pain pattern.  Pain worsens with palpation of the area and body position changes. Denies incont/retention of bowel or bladder, no saddle anesthesia, no focal motor weakness, no tingling/numbness in extremities, no fevers, no injury, no abd pain.      Past Medical History  Diagnosis Date  . GERD (gastroesophageal reflux disease)   . Thyroid nodule   . Anxiety 11/11/11  . Asthma   . Herpes   . Seasonal allergies   . Arthritis   . Depression   . Fibromyalgia   . IBS (irritable bowel syndrome)   . DDD (degenerative disc disease)   . Bursitis     right shoulder  . Heart murmur     as a child per pt  . COPD (chronic obstructive pulmonary disease) ([place])   . Chronic back pain   . Lumbar radiculopathy   . Chronic neck pain   . Chronic shoulder pain    Past Surgical History  Procedure Laterality Date  . Hernia repair      Hiatal   . Cholecystectomy    . Tubal ligation  1983  . Hiatal hernia repair     Family History  Problem Relation Age of Onset  . Lung cancer Mother   . Arthritis Mother     Rheumatoid  . Bursitis Mother   . Heart attack Father 73    Died of MI  . Obesity Father   . Fibromyalgia Sister   . Asthma Mother   . Asthma Child    Social History  Substance Use Topics  . Smoking status: Current  Every Day Smoker -- 0.25 packs/day for 27 years    Types: Cigarettes  . Smokeless tobacco: Never Used  . Alcohol Use: No    Review of Systems ROS: Statement: All systems negative except as marked or noted in the HPI; Constitutional: Negative for fever and chills. ; ; Eyes: Negative for eye pain, redness and discharge. ; ; ENMT: Negative for ear pain, hoarseness, nasal congestion, sinus pressure and sore throat. ; ; Cardiovascular: Negative for chest pain, palpitations, diaphoresis, dyspnea and peripheral edema. ; ; Respiratory: Negative for cough, wheezing and stridor. ; ; Gastrointestinal: Negative for nausea, vomiting, diarrhea, abdominal pain, blood in stool, hematemesis, jaundice and rectal bleeding. . ; ; Genitourinary: +dysuria, urinary frequency. Negative for hematuria. ; ; Musculoskeletal: +LBP. Negative for neck pain. Negative for swelling and trauma.; ; Skin: Negative for pruritus, rash, abrasions, blisters, bruising and skin lesion.; ; Neuro: Negative for headache, lightheadedness and neck stiffness. Negative for weakness, altered level of consciousness , altered mental status, extremity weakness, paresthesias, involuntary movement, seizure and syncope.      Allergies  Cymbalta; Levaquin; Lyrica; Shellfish allergy; Sulfamethoxazole; and Codeine  Home Medications  Prior to Admission medications   Medication Sig Start Date End Date Taking? Authorizing Provider  albuterol (PROVENTIL HFA;VENTOLIN HFA) 108 (90 BASE) MCG/ACT inhaler Inhale 2 puffs into the lungs every 6 (six) hours as needed for shortness of breath. Shortness of breath   Yes Historical Provider, MD  albuterol (PROVENTIL) (2.5 MG/3ML) 0.083% nebulizer solution Take 2.5 mg by nebulization every 6 (six) hours as needed for shortness of breath. Shortness of breath   Yes Historical Provider, MD  aspirin 81 MG tablet Take 81 mg by mouth daily.   Yes Historical Provider, MD  ibuprofen (ADVIL,MOTRIN) 200 MG tablet Take 2 tablets  (400 mg total) by mouth every 6 (six) hours as needed. Patient taking differently: Take 800 mg by mouth every 8 (eight) hours as needed for moderate pain.  01/11/15  Yes Noland Fordyce, PA-C  loratadine (CLARITIN) 10 MG tablet Take 10 mg by mouth daily.   Yes Historical Provider, MD  LORazepam (ATIVAN) 1 MG tablet Take 0.5 mg by mouth every 8 (eight) hours as needed for anxiety.  03/16/11  Yes Alycia Rossetti, MD  pantoprazole (PROTONIX) 20 MG tablet Take 2 tablets (40 mg total) by mouth 2 (two) times daily. Patient taking differently: Take 20 mg by mouth 2 (two) times daily.  04/07/13  Yes Jessica D Zehr, PA-C  sertraline (ZOLOFT) 50 MG tablet Take 50 mg by mouth daily.   Yes Historical Provider, MD  simethicone (GAS-X) 80 MG chewable tablet Chew 80 mg by mouth every 6 (six) hours as needed for flatulence.   Yes Historical Provider, MD  azithromycin (ZITHROMAX) 250 MG tablet Take 1 tablet (250 mg total) by mouth daily. Take first 2 tablets together, then 1 every day until finished. Patient not taking: Reported on 10/07/2015 09/08/15   Gloriann Loan, PA-C  cyclobenzaprine (FLEXERIL) 5 MG tablet Take 1 tablet (5 mg total) by mouth 3 (three) times daily as needed for muscle spasms. Patient not taking: Reported on 10/07/2015 01/11/15   Noland Fordyce, PA-C  HYDROcodone-acetaminophen (NORCO/VICODIN) 5-325 MG per tablet Take 1 tablet by mouth every 4 (four) hours as needed. Patient not taking: Reported on 09/08/2015 08/15/15   Larene Pickett, PA-C  methocarbamol (ROBAXIN) 750 MG tablet Take 1 tablet (750 mg total) by mouth every 8 (eight) hours as needed for muscle spasms. Patient not taking: Reported on 09/08/2015 06/14/15   Linton Flemings, MD  predniSONE (DELTASONE) 20 MG tablet Take 2 tablets (40 mg total) by mouth daily. Patient not taking: Reported on 10/07/2015 09/08/15   Gloriann Loan, PA-C   BP 116/68 mmHg  Pulse 70  Temp(Src) 97.9 F (36.6 C) (Oral)  Resp 18  Ht 5\' 3"  (1.6 m)  Wt 174 lb (78.926 kg)  BMI  30.83 kg/m2  SpO2 94% Physical Exam  0050: Physical examination:  Nursing notes reviewed; Vital signs and O2 SAT reviewed;  Constitutional: Well developed, Well nourished, Well hydrated, In no acute distress; Head:  Normocephalic, atraumatic; Eyes: EOMI, PERRL, No scleral icterus; ENMT: Mouth and pharynx normal, Mucous membranes moist; Neck: Supple, Full range of motion, No lymphadenopathy; Cardiovascular: Regular rate and rhythm, No gallop; Respiratory: Breath sounds clear & equal bilaterally, No wheezes.  Speaking full sentences with ease, Normal respiratory effort/excursion; Chest: Nontender, Movement normal; Abdomen: Soft, Nontender, Nondistended, Normal bowel sounds; Genitourinary: No CVA tenderness; Spine:  No midline CS, TS, LS tenderness. +TTP right lumbar paraspinal muscles.;; Extremities: Pulses normal, No tenderness, No edema, No calf edema or asymmetry.; Neuro: AA&Ox3, Major CN grossly intact.  Speech clear. No gross focal motor or sensory deficits in extremities. Strength 5/5 equal bilat UE's and LE's, including great toe dorsiflexion.  DTR 2/4 equal bilat UE's and LE's.  No gross sensory deficits.  Neg straight leg raises bilat. Climbs on and off stretcher easily by herself. Gait steady with her cane per baseline..; Skin: Color normal, Warm, Dry.   ED Course  Procedures (including critical care time) Labs Review   Imaging Review  I have personally reviewed and evaluated these images and lab results as part of my medical decision-making.   EKG Interpretation None      MDM  MDM Reviewed: previous chart, nursing note and vitals Reviewed previous: labs Interpretation: labs and CT scan     Results for orders placed or performed during the hospital encounter of 10/07/15  Urinalysis, Routine w reflex microscopic-may I&O cath if menses (not at [facility])  Result Value Ref Range   Color, Urine YELLOW YELLOW   APPearance CLOUDY (A) CLEAR   Specific Gravity, Urine 1.020 1.005 - 1.030    pH 6.0 5.0 - 8.0   Glucose, UA NEGATIVE NEGATIVE mg/dL   Hgb urine dipstick NEGATIVE NEGATIVE   Bilirubin Urine NEGATIVE NEGATIVE   Ketones, ur NEGATIVE NEGATIVE mg/dL   Protein, ur NEGATIVE NEGATIVE mg/dL   Urobilinogen, UA 0.2 0.0 - 1.0 mg/dL   Nitrite NEGATIVE NEGATIVE   Leukocytes, UA MODERATE (A) NEGATIVE  CBC  Result Value Ref Range   WBC 13.8 (H) 4.0 - 10.5 K/uL   RBC 4.97 3.87 - 5.11 MIL/uL   Hemoglobin 15.6 (H) 12.0 - 15.0 g/dL   HCT 45.9 36.0 - 46.0 %   MCV 92.4 78.0 - 100.0 fL   MCH 31.4 26.0 - 34.0 pg   MCHC 34.0 30.0 - 36.0 g/dL   RDW 13.9 11.5 - 15.5 %   Platelets 299 150 - 400 K/uL  Basic metabolic panel  Result Value Ref Range   Sodium 140 135 - 145 mmol/L   Potassium 3.9 3.5 - 5.1 mmol/L   Chloride 108 101 - 111 mmol/L   CO2 26 22 - 32 mmol/L   Glucose, Bld 122 (H) 65 - 99 mg/dL   BUN 8 6 - 20 mg/dL   Creatinine, Ser 0.71 0.44 - 1.00 mg/dL   Calcium 8.8 (L) 8.9 - 10.3 mg/dL   GFR calc non Af Amer >60 >60 mL/min   GFR calc Af Amer >60 >60 mL/min   Anion gap 6 5 - 15  Lactic acid, plasma  Result Value Ref Range   Lactic Acid, Venous 0.9 0.5 - 2.0 mmol/L  Urine microscopic-add on  Result Value Ref Range   Squamous Epithelial / LPF RARE RARE   WBC, UA 3-6 <3 WBC/hpf   Bacteria, UA RARE RARE   Urine-Other MUCOUS PRESENT    Ct Renal Stone Study 10/08/2015  CLINICAL DATA:  Acute onset of right flank pain. Dysuria. Initial encounter. EXAM: CT ABDOMEN AND PELVIS WITHOUT CONTRAST TECHNIQUE: Multidetector CT imaging of the abdomen and pelvis was performed following the standard protocol without IV contrast. COMPARISON:  CT of the abdomen and pelvis performed 02/17/2013 FINDINGS: The visualized lung bases are clear. Postoperative change is noted about the gastroesophageal junction. The liver and spleen are unremarkable in appearance. The patient is status post cholecystectomy, with clips noted along the gallbladder fossa. The pancreas and adrenal glands are  unremarkable. The kidneys are unremarkable in appearance. There is no evidence of hydronephrosis. No renal or ureteral stones are seen. No  perinephric stranding is appreciated. No free fluid is identified. The small bowel is unremarkable in appearance. The stomach is within normal limits. No acute vascular abnormalities are seen. Scattered calcification is noted along the distal abdominal aorta and its branches. The appendix is normal in caliber and contains air, without evidence of appendicitis. Minimal diverticulosis is noted along the descending and sigmoid colon, without evidence of diverticulitis. The bladder is mildly distended and grossly unremarkable in appearance. The uterus is unremarkable. The ovaries are relatively symmetric. No suspicious adnexal masses are seen. No inguinal lymphadenopathy is seen. No acute osseous abnormalities are identified. Vacuum phenomenon is noted at L5-S1. There is mild chronic loss of height at vertebral body L3. IMPRESSION: 1. No acute abnormality seen within the abdomen or pelvis. 2. Minimal diverticulosis along the descending and sigmoid colon, without evidence of diverticulitis. 3. Scattered calcification along the distal abdominal aorta and its branches. Electronically Signed   By: Garald Balding M.D.   On: 10/08/2015 02:01    0330:  No clear UTI on Udip; UC is pending. CT scan reassuring. Neuro exam remains intact. Bladder scan with 79ml, no urinary retention. Pt does not want pelvic exam while in the ED; rationale explained, pt verb understanding but still refuses. Pt states she "will go to my doctor for that Monday." Tx symptomatically at this time. Dx and testing d/w pt and family.  Questions answered.  Verb understanding, agreeable to d/c home with outpt f/u.   Francine Graven, DO 10/10/15 [phone]

## 2015-10-08 NOTE — ED Notes (Signed)
Pt has had many prior kidney/bladder infections and believes that this is the same issue today.  8/10 right flank pain that she describes as "aching" in her kidney.  Burning on urination and dysuria.  No additional symptoms reported.

## 2015-10-08 NOTE — Discharge Instructions (Signed)
°Emergency Department Resource Guide °1) Find a Doctor and Pay Out of Pocket °Although you won't have to find out who is covered by your insurance plan, it is a good idea to ask around and get recommendations. You will then need to call the office and see if the doctor you have chosen will accept you as a new patient and what types of options they offer for patients who are self-pay. Some doctors offer discounts or will set up payment plans for their patients who do not have insurance, but you will need to ask so you aren't surprised when you get to your appointment. ° °2) Contact Your Local Health Department °Not all health departments have doctors that can see patients for sick visits, but many do, so it is worth a call to see if yours does. If you don't know where your local health department is, you can check in your phone book. The CDC also has a tool to help you locate your state's health department, and many state websites also have listings of all of their local health departments. ° °3) Find a Walk-in Clinic °If your illness is not likely to be very severe or complicated, you may want to try a walk in clinic. These are popping up all over the country in pharmacies, drugstores, and shopping centers. They're usually staffed by nurse practitioners or physician assistants that have been trained to treat common illnesses and complaints. They're usually fairly quick and inexpensive. However, if you have serious medical issues or chronic medical problems, these are probably not your best option. ° °No Primary Care Doctor: °- Call [facility] at  832-8000 - they can help you locate a primary care doctor that  accepts your insurance, provides certain services, etc. °- Physician Referral Service- 1-800-533-3463 ° °Chronic Pain Problems: °Organization         Address  Phone   Notes  ° [facility]  (336) 297-2271 Patients need to be referred by their primary care doctor.  ° °Medication  Assistance: °Organization         Address  Phone   Notes  °[place] [facility] [address] °[place], [place] [address] (336) 641-8030 --Must be a resident of [place] °-- Must have NO insurance coverage whatsoever (no Medicaid/ Medicare, etc.) °-- The pt. MUST have a primary care doctor that directs their care regularly and follows them in the community °  °[facility]  (866) 331-1348   °[employer]  (888) 892-1162   ° °Agencies that provide inexpensive medical care: °Organization         Address  Phone   Notes  °[facility]  (336) 832-8035   °[facility]    (336) 832-7272   °[facility] [address] °[place], [place] [address] (336) 832-4777   °[facility] [address], °[place] (336) 271-4999   °[facility]    (336) 373-0678   °[facility]    (336) 272-1050   °[facility] ° [address],  Phone:  (336) 832-4444, Fax:  (336) 832-4440 Hours of Operation:  9 am - 6 pm, M-F.  Also accepts Medicaid/Medicare and self-pay.  °[facility] ° [address],  Phone: (336) 832-3150, Fax: (336) 832-3151. Hours of Operation:  8:30 am - 5:30 pm, M-F.  Also accepts Medicaid and self-pay.  °[facility] [place] [address], [place] Phone: (336) 878-6027   °[facility] [address], [place], [place] (336)723-1848, Ext. 123 Mondays & Thursdays: 7-9 AM.  First 15 patients are seen on a first come, first serve basis. °  ° °Medicaid-accepting [place] Providers: ° °Organization         Address  Phone   Notes  °[facility] [address], [place] (336) 641-2100 Also accepts self-pay patients.  °[facility] [address], [place] ° (336) 856-9996   °[facility] [address], [place]  (336) 288-8857   °[facility] [address] [place] [address], [place] (336) 299-7000   °Veita Bland [address], [place]  ° (336) 373-1557 Only accepts [place] Access Medicaid patients after they have their name applied to their card.  ° °Self-Pay (no insurance) in [place]: ° °Organization         Address  Phone   Notes  °Sickle Cell Patients, [facility] [address], [place] (336) 832-1970   °[facility] [address], [place] (336) 832-4400   °[facility] ° [address], [place] (336) 992-4800   °[facility]/Dr. Osei-Bonsu ° [address] [place] [address], [place] or [address], [place] (336) [phone] Phone number for both [place] and [place] locations is the same.  °[facility] [address], [place] (336) 299-0000   °[facility] [address] [place] [address], [place] or [address] (336) 852-7530 °(336) 878-2260   °[facility] [address], [place] (336) 350-1642, phone; (336) 294-5005, fax Sees patients 1st and 3rd Saturday of every month.  Must not qualify for public or private insurance (i.e. Medicaid, Medicare, [place] Health Choice, Veterans' Benefits) • Household income should be no more than 200% of the poverty level •The clinic cannot treat you if you are pregnant or think you are pregnant • Sexually transmitted diseases are not treated at the clinic.  ° ° °Dental Care: °Organization         Address  Phone  Notes  °[place] [facility] [address],  (336) 641-6152 Accepts children up to age 21 who are enrolled in Medicaid or [place] Health Choice; pregnant women with a Medicaid card; and children who have applied for Medicaid or [place] Health Choice, but were declined, whose parents can pay a reduced fee at time of service.  °[place]  [facility] [place]  [address], [place] (336) 641-7733 Accepts children up to age 21 who are enrolled in Medicaid or [place] Health Choice; pregnant women with a Medicaid card; and children who have applied for Medicaid or [place] Health Choice, but were declined, whose parents can pay a reduced fee at time of service.  °[facility] ° [address],  (336) 641-4533 Patients are seen by appointment only. Walk-ins are not accepted. [facility] will see patients 18 years of age and older. °Monday - Tuesday (8am-5pm) °Most Wednesdays (8:30-5pm) °$30 per visit, cash only  °[facility] ° [address], [place] (336) 641-4533 Patients are seen by appointment only. Walk-ins are not accepted. [facility] will see patients 18 years of age and older. °One   Wednesday Evening (Monthly: Volunteer Based).  $30 per visit, cash only  °[facility]  (919) 537-3737 for adults; Children under age 4, call Graduate Pediatric Dentistry at ([phone]) [phone]. Children aged 4-14, please call (919) [phone] to request a pediatric application. ° Dental services are provided in all areas of dental care including fillings, crowns and bridges, complete and partial dentures, implants, gum treatment, root canals, and extractions. Preventive care is also provided. Treatment is provided to both adults and children. °Patients are selected via a lottery and there is often a waiting list. °  °[facility] [address], °[place] ° (336) 763-8833 [email or web address] °  °[facility] [address], [place], [place] (336)723-1848, Ext. 123 Second and Fourth Thursday of each month, opens at 6:30 AM; Clinic ends at 9 AM.  Patients are seen on a first-come first-served basis, and a limited number are seen during each clinic.  ° °[facility] ° [address], [place], [place] (336) 723-7904    Eligibility Requirements °You must have lived in [place], [place], or [place] counties for at least the last three months. °  You cannot be eligible for state or federal sponsored healthcare insurance, including Veterans Administration, Medicaid, or Medicare. °  You generally cannot be eligible for healthcare insurance through your employer.  °  How to apply: °Eligibility screenings are held every Tuesday and Wednesday afternoon from 1:00 pm until 4:00 pm. You do not need an appointment for the interview!  °[facility] [address], [place], [place] 336-631-2330   °[facility]  336-342-8273   °[facility]  336-703-3100   °[facility]  336-570-6415   ° °Behavioral Health Resources in the Community: °Intensive Outpatient Programs °Organization         Address  Phone  Notes  °[facility] [address], [place], [place] 336-878-6098   °[facility] [address], [place], [place] 336-832-9800   °[facility] [address], [place], [place] ° 336-882-2125   °[facility] [address],  °[place], [place] 1-800-853-5163 or 336-641-4981   °Substance Abuse Resources °Organization         Address  Phone  Notes  °[facility]  336-882-2125   °[facility]  336-784-9470   °[facility]  336-285-9073   °[facility]  336-845-3988   °Residential & Outpatient Substance Abuse Program  1-800-659-3381   °Psychological Services °Organization         Address  Phone  Notes  ° Health  336- 832-9600   °[facility]  336- 378-7881   °[facility] [address], [place] 1-800-853-5163 or 336-641-4981   ° °Mobile Crisis Teams °Organization         Address  Phone  Notes  °[facility], Mobile Crisis Care Unit  1-877-626-1772   °Assertive °[facility] ° [address]  [place], [place] 336-834-9664   °Sharon DeEsch [address] °[place] 336-554-5454   ° °Self-Help/Support Groups °Organization         Address  Phone             Notes  °[facility] [place] - variety of support groups  336- 373-1402 Call for more information  °Narcotics Anonymous (NA), [facility] [address], °[place]  2 meetings at this location  ° °  Residential Treatment Programs Organization         Address  Phone  Notes  [facility] [address]    [place]  1-(680)080-1729   [facility]  [address] [place] [id number], [place], [place]   [place], [place] 321-310-7342 Admissions: 8am-3pm M-F  [facility] [place] [address],    [place], [place] [phone]   [facility] [address] [place], [place], [place]   [facility] [address]  [place], [place]   [facility] Intensive Outpatient [address], [place], [place]   [facility] [place].,  [place], [place] 1-925-079-5544 or 336-007-0647   [facility] [address] [place], [place] Accepts Medicaid  [facility] [address]  [place] 1-737-005-6772 Substance Abuse/Addiction Treatment   [facility]         Address  Phone  Notes  [facility]  231-724-3026   Domenic Schwab, PhD [address] Arlis Porta [place], [place]   930-814-4520 or (812) 015-5209   [place], [place] (860)847-8469   [facility] [address], [place], [place] (541)640-0701 Insurance/Medicaid/sponsorship through [facility] [address]                                    [place], [place] [phone] [place] [address], [place] [phone]    Dr. Adele Schilder  785-084-9642   [facility] Dept. 1) [address], [place] 2) [place] 3)  [address], [place] 479-033-2623 727-375-6046  787-867-8521   [place] [phone] or 579-262-8737 (After Hours)      Take the prescriptions as directed.  Apply moist heat or ice to the area(s) of discomfort, for 15 minutes at a time, several times per day for the next few days.  Do not fall asleep on a heating or ice pack.  Call your regular medical doctor on Monday to schedule a follow up appointment in the next 2 to 3 days.  Return to the Emergency Department immediately if worsening.

## 2015-10-10 LAB — URINE CULTURE

## 2015-11-01 ENCOUNTER — Emergency Department (HOSPITAL_COMMUNITY)
Admission: EM | Admit: 2015-11-01 | Discharge: 2015-11-02 | Discharge status: Against Medical Advice | Payer: Medicaid Other | Attending: Emergency Medicine | Admitting: Emergency Medicine

## 2015-11-01 ENCOUNTER — Encounter (HOSPITAL_COMMUNITY): Payer: Self-pay | Admitting: Emergency Medicine

## 2015-11-01 DIAGNOSIS — G8929 Other chronic pain: Secondary | ICD-10-CM | POA: Insufficient documentation

## 2015-11-01 DIAGNOSIS — R011 Cardiac murmur, unspecified: Secondary | ICD-10-CM | POA: Insufficient documentation

## 2015-11-01 DIAGNOSIS — F17210 Nicotine dependence, cigarettes, uncomplicated: Secondary | ICD-10-CM | POA: Insufficient documentation

## 2015-11-01 DIAGNOSIS — R197 Diarrhea, unspecified: Secondary | ICD-10-CM | POA: Insufficient documentation

## 2015-11-01 DIAGNOSIS — J449 Chronic obstructive pulmonary disease, unspecified: Secondary | ICD-10-CM | POA: Insufficient documentation

## 2015-11-01 NOTE — ED Notes (Addendum)
Pt having fever/chills/congestin/decreased appetite. Was seen recently and given Augmentin-finished regimen. Now says, "If I move, cough, sneeze I have uncontrollable diarrhea. I can't stop it. I think I'm dehydrated because I can't get enough to drink." Has not had a fever today and says this is an improvement from Saturday. Says congestion is better. Able to cough up white productive sputum at this time. RR even/unlabored. No other c/c. Has been using Imodium with no diarrheal relief.

## 2015-11-01 NOTE — ED Notes (Signed)
Patient refused lab draw at this time.  

## 2015-11-02 NOTE — ED Notes (Signed)
Pt didn't answer for assigned room.

## 2015-11-02 NOTE — ED Notes (Signed)
Patient didn't answer in the lobby for an assigned room.

## 2015-11-02 NOTE — ED Notes (Addendum)
Patient didn't answer in the lobby for an assigned room.

## 2015-12-23 ENCOUNTER — Encounter (HOSPITAL_BASED_OUTPATIENT_CLINIC_OR_DEPARTMENT_OTHER): Payer: Self-pay | Admitting: *Deleted

## 2015-12-23 ENCOUNTER — Emergency Department (HOSPITAL_BASED_OUTPATIENT_CLINIC_OR_DEPARTMENT_OTHER): Payer: Medicaid Other

## 2015-12-23 ENCOUNTER — Emergency Department (HOSPITAL_BASED_OUTPATIENT_CLINIC_OR_DEPARTMENT_OTHER)
Admission: EM | Admit: 2015-12-23 | Discharge: 2015-12-23 | Disposition: A | Discharge status: Home / Self Care | Payer: Medicaid Other | Attending: Emergency Medicine | Admitting: Emergency Medicine

## 2015-12-23 DIAGNOSIS — J449 Chronic obstructive pulmonary disease, unspecified: Secondary | ICD-10-CM | POA: Diagnosis not present

## 2015-12-23 DIAGNOSIS — M797 Fibromyalgia: Secondary | ICD-10-CM | POA: Insufficient documentation

## 2015-12-23 DIAGNOSIS — Z79899 Other long term (current) drug therapy: Secondary | ICD-10-CM | POA: Diagnosis not present

## 2015-12-23 DIAGNOSIS — F419 Anxiety disorder, unspecified: Secondary | ICD-10-CM | POA: Insufficient documentation

## 2015-12-23 DIAGNOSIS — M25512 Pain in left shoulder: Secondary | ICD-10-CM | POA: Diagnosis present

## 2015-12-23 DIAGNOSIS — Z8619 Personal history of other infectious and parasitic diseases: Secondary | ICD-10-CM | POA: Insufficient documentation

## 2015-12-23 DIAGNOSIS — Z7982 Long term (current) use of aspirin: Secondary | ICD-10-CM | POA: Diagnosis not present

## 2015-12-23 DIAGNOSIS — Z8719 Personal history of other diseases of the digestive system: Secondary | ICD-10-CM | POA: Diagnosis not present

## 2015-12-23 DIAGNOSIS — Z8639 Personal history of other endocrine, nutritional and metabolic disease: Secondary | ICD-10-CM | POA: Diagnosis not present

## 2015-12-23 DIAGNOSIS — G8929 Other chronic pain: Secondary | ICD-10-CM | POA: Insufficient documentation

## 2015-12-23 DIAGNOSIS — M1990 Unspecified osteoarthritis, unspecified site: Secondary | ICD-10-CM | POA: Diagnosis not present

## 2015-12-23 DIAGNOSIS — M7502 Adhesive capsulitis of left shoulder: Secondary | ICD-10-CM | POA: Diagnosis not present

## 2015-12-23 DIAGNOSIS — R011 Cardiac murmur, unspecified: Secondary | ICD-10-CM | POA: Insufficient documentation

## 2015-12-23 DIAGNOSIS — F17210 Nicotine dependence, cigarettes, uncomplicated: Secondary | ICD-10-CM | POA: Diagnosis not present

## 2015-12-23 DIAGNOSIS — F329 Major depressive disorder, single episode, unspecified: Secondary | ICD-10-CM | POA: Insufficient documentation

## 2015-12-23 MED ORDER — LIDOCAINE 5 % EX PTCH: 1.0000 | MEDICATED_PATCH | CUTANEOUS | Status: DC

## 2015-12-23 MED ORDER — DICLOFENAC SODIUM ER 100 MG PO TB24: 100.0000 mg | ORAL_TABLET | Freq: Every day | ORAL | Status: DC

## 2015-12-23 MED ORDER — METHOCARBAMOL 500 MG PO TABS
1000.0000 mg | ORAL_TABLET | Freq: Once | ORAL | Status: AC
  Administered 2015-12-23: 1000 mg via ORAL
  Filled 2015-12-23: qty 2

## 2015-12-23 MED ORDER — KETOROLAC TROMETHAMINE 60 MG/2ML IM SOLN
60.0000 mg | Freq: Once | INTRAMUSCULAR | Status: AC
  Administered 2015-12-23: 60 mg via INTRAMUSCULAR
  Filled 2015-12-23: qty 2

## 2015-12-23 NOTE — Discharge Instructions (Signed)
Adhesive Capsulitis Adhesive capsulitis is inflammation of the tendons and ligaments that surround the shoulder joint (shoulder capsule). This condition causes the shoulder to become stiff and painful to move. Adhesive capsulitis is also called frozen shoulder. CAUSES This condition may be caused by:  An injury to the shoulder joint.  Straining the shoulder.  Not moving the shoulder for a period of time. This can happen if your arm was injured or in a sling.  Long-standing health problems, such as:  Diabetes.  Thyroid problems.  Heart disease.  Stroke.  Rheumatoid arthritis.  Lung disease. In some cases, the cause may not be known. RISK FACTORS This condition is more likely to develop in:  Women.  People who are older than 53 years of age. SYMPTOMS Symptoms of this condition include:  Pain in the shoulder when moving the arm. There may also be pain when parts of the shoulder are touched. The pain is worse at night or when at rest.  Soreness or aching in the shoulder.  Inability to move the shoulder normally.  Muscle spasms. DIAGNOSIS This condition is diagnosed with a physical exam and imaging tests, such as an X-ray or MRI. TREATMENT This condition may be treated with:  Treatment of the underlying cause or condition.  Physical therapy. This involves performing exercises to get the shoulder moving again.  Medicine. Medicine may be given to relieve pain, inflammation, or muscle spasms.  Steroid injections into the shoulder joint.  Shoulder manipulation. This is a procedure to move the shoulder into another position. It is done after you are given a medicine to make you fall asleep (general anesthetic). The joint may also be injected with salt water at high pressure to break down scarring.  Surgery. This may be done in severe cases when other treatments have failed. Although most people recover completely from adhesive capsulitis, some may not regain the full  movement of the shoulder. HOME CARE INSTRUCTIONS  Take over-the-counter and prescription medicines only as told by your health care provider.  If you are being treated with physical therapy, follow instructions from your physical therapist.  Avoid exercises that put a lot of demand on your shoulder, such as throwing. These exercises can make pain worse.  If directed, apply ice to the injured area:  Put ice in a plastic bag.  Place a towel between your skin and the bag.  Leave the ice on for 20 minutes, 2-3 times per day. SEEK MEDICAL CARE IF:  You develop new symptoms.  Your symptoms get worse.   This information is not intended to replace advice given to you by your health care provider. Make sure you discuss any questions you have with your health care provider.   Document Released: 09/09/2009 Document Revised: 08/03/2015 Document Reviewed: 03/07/2015 Elsevier Interactive Patient Education 2016 [place] therapy can help ease sore, stiff, injured, and tight muscles and joints. Heat relaxes your muscles, which may help ease your pain. Heat therapy should only be used on old, pre-existing, or long-lasting (chronic) injuries. Do not use heat therapy unless told by your doctor. HOW TO USE HEAT THERAPY There are several different kinds of heat therapy, including:  Moist heat pack.  Warm water bath.  Hot water bottle.  Electric heating pad.  Heated gel pack.  Heated wrap.  Electric heating pad. GENERAL HEAT THERAPY RECOMMENDATIONS   Do not sleep while using heat therapy. Only use heat therapy while you are awake.  Your skin may turn pink  while using heat therapy. Do not use heat therapy if your skin turns red.  Do not use heat therapy if you have new pain.  High heat or long exposure to heat can cause burns. Be careful when using heat therapy to avoid burning your skin.  Do not use heat therapy on areas of your skin that are already  irritated, such as with a rash or sunburn. GET HELP IF:   You have blisters, redness, swelling (puffiness), or numbness.  You have new pain.  Your pain is worse. MAKE SURE YOU:  Understand these instructions.  Will watch your condition.  Will get help right away if you are not doing well or get worse.   This information is not intended to replace advice given to you by your health care provider. Make sure you discuss any questions you have with your health care provider.   Document Released: 02/04/2012 Document Revised: 12/03/2014 Document Reviewed: 01/05/2014 Elsevier Interactive Patient Education [employer].

## 2015-12-23 NOTE — ED Notes (Signed)
C/o left shoulder pain onset yesterday  Worse today  Hx of same

## 2015-12-23 NOTE — ED Notes (Signed)
Left shoulder pain. She states her arm has been locking up. Unable to lift her arm due to pain.

## 2015-12-23 NOTE — ED Provider Notes (Signed)
CSN: [id number]     Arrival date & time 12/23/15  2126 History  By signing my name below, I, Sheri Rivera, attest that this documentation has been prepared under the direction and in the presence of Shabree Tebbetts, MD. Electronically Signed: [name], ED Scribe. 12/23/2015. 11:20 PM.    Chief Complaint  Patient presents with  . Shoulder Pain   Patient is a 55 y.o. female presenting with shoulder pain. The history is provided by the patient. No language interpreter was used.  Shoulder Pain Location:  Shoulder Injury: no   Shoulder location:  L shoulder Pain details:    Quality:  Aching   Radiates to:  Does not radiate   Severity:  Severe   Onset quality:  Gradual   Timing:  Constant   Progression:  Worsening Chronicity:  Chronic Dislocation: no   Foreign body present:  No foreign bodies Prior injury to area:  No Relieved by:  Nothing Worsened by:  Movement Ineffective treatments:  NSAIDs Associated symptoms: decreased range of motion and stiffness   Associated symptoms: no back pain, no fever, no muscle weakness, no neck pain and no swelling    HPI Comments: Sheri Rivera is a 55 y.o. female with a hx of adhesive capsulitis, and non-follow up who presents to the Emergency Department complaining of gradually worsening severe constant left shoulder pain flare up onset yesterday. She reports decreased ROM. She states that she has been taking Ibuprofen with no relief. She explains that she received a referral to an Orthopedic surgeon 5 months ago but was told that she needed one from her PCP. She adds that she has spoken to her PCP but she was not able to write her the referral although pt is not sure of the reason. No fever or chills.   PCP: Dr. Doreene Nest   Past Medical History  Diagnosis Date  . GERD (gastroesophageal reflux disease)   . Thyroid nodule   . Anxiety 11/11/11  . Asthma   . Herpes   . Seasonal allergies   . Arthritis   . Depression   . Fibromyalgia    . IBS (irritable bowel syndrome)   . DDD (degenerative disc disease)   . Bursitis     right shoulder  . Heart murmur     as a child per pt  . COPD (chronic obstructive pulmonary disease) ([place])   . Chronic back pain   . Lumbar radiculopathy   . Chronic neck pain   . Chronic shoulder pain    Past Surgical History  Procedure Laterality Date  . Hernia repair      Hiatal   . Cholecystectomy    . Tubal ligation  1983  . Hiatal hernia repair     Family History  Problem Relation Age of Onset  . Lung cancer Mother   . Arthritis Mother     Rheumatoid  . Bursitis Mother   . Heart attack Father 62    Died of MI  . Obesity Father   . Fibromyalgia Sister   . Asthma Mother   . Asthma Child    Social History  Substance Use Topics  . Smoking status: Current Every Day Smoker -- 0.25 packs/day for 27 years    Types: Cigarettes  . Smokeless tobacco: Never Used  . Alcohol Use: No   OB History    No data available     Review of Systems  Constitutional: Negative for fever and diaphoresis.  Respiratory: Negative for cough, chest tightness  and shortness of breath.   Cardiovascular: Negative for chest pain, palpitations and leg swelling.  Musculoskeletal: Positive for arthralgias (left shoulder) and stiffness. Negative for back pain, joint swelling, neck pain and neck stiffness.  Neurological: Negative for weakness.  All other systems reviewed and are negative.     Allergies  Cymbalta; Levaquin; Lyrica; Shellfish allergy; Sulfamethoxazole; and Codeine  Home Medications   Prior to Admission medications   Medication Sig Start Date End Date Taking? Authorizing Provider  albuterol (PROVENTIL HFA;VENTOLIN HFA) 108 (90 BASE) MCG/ACT inhaler Inhale 2 puffs into the lungs every 6 (six) hours as needed for shortness of breath. Shortness of breath    Historical Provider, MD  albuterol (PROVENTIL) (2.5 MG/3ML) 0.083% nebulizer solution Take 2.5 mg by nebulization every 6 (six) hours as  needed for shortness of breath. Shortness of breath    Historical Provider, MD  aspirin 81 MG tablet Take 81 mg by mouth daily.    Historical Provider, MD  azithromycin (ZITHROMAX) 250 MG tablet Take 1 tablet (250 mg total) by mouth daily. Take first 2 tablets together, then 1 every day until finished. Patient not taking: Reported on 10/07/2015 09/08/15   Gloriann Loan, PA-C  cyclobenzaprine (FLEXERIL) 5 MG tablet Take 1 tablet (5 mg total) by mouth 3 (three) times daily as needed for muscle spasms. Patient not taking: Reported on 10/07/2015 01/11/15   Noland Fordyce, PA-C  HYDROcodone-acetaminophen (NORCO/VICODIN) 5-325 MG tablet 1 or 2 tabs PO q6 hours prn pain 10/08/15   Francine Graven, DO  ibuprofen (ADVIL,MOTRIN) 200 MG tablet Take 2 tablets (400 mg total) by mouth every 6 (six) hours as needed. Patient taking differently: Take 800 mg by mouth every 8 (eight) hours as needed for moderate pain.  01/11/15   Noland Fordyce, PA-C  loratadine (CLARITIN) 10 MG tablet Take 10 mg by mouth daily.    Historical Provider, MD  LORazepam (ATIVAN) 1 MG tablet Take 0.5 mg by mouth every 8 (eight) hours as needed for anxiety.  03/16/11   Alycia Rossetti, MD  methocarbamol (ROBAXIN) 500 MG tablet Take 2 tablets (1,000 mg total) by mouth 4 (four) times daily as needed for muscle spasms (muscle spasm/pain). 10/08/15   Francine Graven, DO  pantoprazole (PROTONIX) 20 MG tablet Take 2 tablets (40 mg total) by mouth 2 (two) times daily. Patient taking differently: Take 20 mg by mouth 2 (two) times daily.  04/07/13   Laban Emperor Zehr, PA-C  phenazopyridine (PYRIDIUM) 100 MG tablet Take 1 tablet (100 mg total) by mouth 3 (three) times daily. 10/08/15   Francine Graven, DO  predniSONE (DELTASONE) 20 MG tablet Take 2 tablets (40 mg total) by mouth daily. Patient not taking: Reported on 10/07/2015 09/08/15   Gloriann Loan, PA-C  sertraline (ZOLOFT) 50 MG tablet Take 50 mg by mouth daily.    Historical Provider, MD  simethicone  (GAS-X) 80 MG chewable tablet Chew 80 mg by mouth every 6 (six) hours as needed for flatulence.    Historical Provider, MD   BP 124/81 mmHg  Pulse 75  Temp(Src) 97.6 F (36.4 C) (Oral)  Resp 20  Ht 5\' 3"  (1.6 m)  Wt 174 lb (78.926 kg)  BMI 30.83 kg/m2  SpO2 96% Physical Exam  Constitutional: She is oriented to person, place, and time. She appears well-developed and well-nourished. No distress.  HENT:  Head: Normocephalic and atraumatic.  Mouth/Throat: Oropharynx is clear and moist.  Eyes: Conjunctivae and EOM are normal. Pupils are equal, round, and reactive to light.  Neck: Normal range of motion. Neck supple. No tracheal deviation present.  Cardiovascular: Normal rate and regular rhythm.   Pulmonary/Chest: Effort normal. No respiratory distress. She has no wheezes. She has no rales.  Lungs are clear  Abdominal: Soft. Bowel sounds are normal. There is no tenderness. There is no guarding.  Musculoskeletal:       Left shoulder: She exhibits decreased range of motion and tenderness. She exhibits no swelling, no effusion, no crepitus, no deformity, no laceration, no spasm, normal pulse and normal strength.       Left elbow: Normal.       Left wrist: Normal.       Cervical back: Normal.       Left upper arm: Normal.       Left hand: Normal. She exhibits normal capillary refill. Normal sensation noted. Normal strength noted.   Sensation intact to all nerve distributions of the LUE  Neurological: She is alert and oriented to person, place, and time. She has normal strength. She displays no atrophy. She exhibits normal muscle tone. GCS eye subscore is 4. GCS verbal subscore is 5. GCS motor subscore is 6.  Reflex Scores:      Tricep reflexes are 2+ on the left side.      Bicep reflexes are 2+ on the left side.      Brachioradialis reflexes are 2+ on the left side. DTRs intact  Skin: Skin is warm and dry.  Psychiatric: She has a normal mood and affect. Her behavior is normal.  Nursing  note and vitals reviewed.   ED Course  Procedures (including critical care time) DIAGNOSTIC STUDIES: Oxygen Saturation is 96% on RA, adequate by my interpretation.    COORDINATION OF CARE: 11:11 PM- Pt advised of plan for treatment and pt agrees. Advised to follow up with Orthopedics and to speak to PCP about referral as soon as possible since her pain will progress. Pt will receive Toradol and Robaxin.   Imaging Review Dg Shoulder Left  12/23/2015  CLINICAL DATA:  Left shoulder pain. The arm has been locking up. Unable to lift the arm due to pain. Symptoms for 1 day. No known injury EXAM: LEFT SHOULDER - 2+ VIEW COMPARISON:  08/21/2015 FINDINGS: Degenerative changes in the acromioclavicular and glenohumeral joints. No evidence of acute fracture or dislocation of the left shoulder. No focal bone lesion or bone destruction. Soft tissues are unremarkable. IMPRESSION: Degenerative changes in the left shoulder. No acute bony abnormalities. Electronically Signed   By: Lucienne Capers M.D.   On: 12/23/2015 22:10   Oaklynn Stierwalt, MD has personally reviewed and evaluated these images and lab results as part of her medical decision-making.  MDM   Final diagnoses:  None    I concur that the patient has adhesive capsulitis.  She has not been moving it.  This has been going on for some months and the patient has not followed up with orthopedics as directed.  Will start NSAIDS but patient informed that she needs to see an orthopedic surgeon. Patient verbalizes understanding and agrees to follow up  I personally performed the services described in this documentation, which was scribed in my presence. The recorded information has been reviewed and is accurate.     Veatrice Kells, MD 12/24/15 [phone]

## 2015-12-24 ENCOUNTER — Encounter (HOSPITAL_BASED_OUTPATIENT_CLINIC_OR_DEPARTMENT_OTHER): Payer: Self-pay | Admitting: Emergency Medicine

## 2016-03-14 ENCOUNTER — Emergency Department (HOSPITAL_BASED_OUTPATIENT_CLINIC_OR_DEPARTMENT_OTHER)
Admission: EM | Admit: 2016-03-14 | Discharge: 2016-03-14 | Disposition: A | Discharge status: Home / Self Care | Payer: Medicaid Other | Attending: Emergency Medicine | Admitting: Emergency Medicine

## 2016-03-14 ENCOUNTER — Encounter (HOSPITAL_BASED_OUTPATIENT_CLINIC_OR_DEPARTMENT_OTHER): Payer: Self-pay

## 2016-03-14 DIAGNOSIS — M797 Fibromyalgia: Secondary | ICD-10-CM | POA: Diagnosis not present

## 2016-03-14 DIAGNOSIS — J449 Chronic obstructive pulmonary disease, unspecified: Secondary | ICD-10-CM | POA: Diagnosis not present

## 2016-03-14 DIAGNOSIS — F329 Major depressive disorder, single episode, unspecified: Secondary | ICD-10-CM | POA: Insufficient documentation

## 2016-03-14 DIAGNOSIS — K219 Gastro-esophageal reflux disease without esophagitis: Secondary | ICD-10-CM | POA: Insufficient documentation

## 2016-03-14 DIAGNOSIS — Z79899 Other long term (current) drug therapy: Secondary | ICD-10-CM | POA: Diagnosis not present

## 2016-03-14 DIAGNOSIS — Z8639 Personal history of other endocrine, nutritional and metabolic disease: Secondary | ICD-10-CM | POA: Insufficient documentation

## 2016-03-14 DIAGNOSIS — F17210 Nicotine dependence, cigarettes, uncomplicated: Secondary | ICD-10-CM | POA: Diagnosis not present

## 2016-03-14 DIAGNOSIS — M545 Low back pain: Secondary | ICD-10-CM | POA: Diagnosis present

## 2016-03-14 DIAGNOSIS — F419 Anxiety disorder, unspecified: Secondary | ICD-10-CM | POA: Insufficient documentation

## 2016-03-14 DIAGNOSIS — M5441 Lumbago with sciatica, right side: Secondary | ICD-10-CM | POA: Diagnosis not present

## 2016-03-14 DIAGNOSIS — M1990 Unspecified osteoarthritis, unspecified site: Secondary | ICD-10-CM | POA: Diagnosis not present

## 2016-03-14 DIAGNOSIS — Z8619 Personal history of other infectious and parasitic diseases: Secondary | ICD-10-CM | POA: Diagnosis not present

## 2016-03-14 DIAGNOSIS — R011 Cardiac murmur, unspecified: Secondary | ICD-10-CM | POA: Diagnosis not present

## 2016-03-14 DIAGNOSIS — G8929 Other chronic pain: Secondary | ICD-10-CM | POA: Insufficient documentation

## 2016-03-14 MED ORDER — PREDNISONE 50 MG PO TABS
60.0000 mg | ORAL_TABLET | Freq: Once | ORAL | Status: AC
  Administered 2016-03-14: 60 mg via ORAL
  Filled 2016-03-14: qty 1

## 2016-03-14 MED ORDER — DIAZEPAM 2 MG PO TABS
2.0000 mg | ORAL_TABLET | Freq: Once | ORAL | Status: AC
  Administered 2016-03-14: 2 mg via ORAL
  Filled 2016-03-14: qty 1

## 2016-03-14 MED ORDER — PREDNISONE 20 MG PO TABS: 40.0000 mg | ORAL_TABLET | Freq: Every day | ORAL | Status: DC

## 2016-03-14 MED ORDER — HYDROCODONE-ACETAMINOPHEN 5-325 MG PO TABS
1.0000 | ORAL_TABLET | Freq: Once | ORAL | Status: AC
  Administered 2016-03-14: 1 via ORAL
  Filled 2016-03-14: qty 1

## 2016-03-14 MED ORDER — IBUPROFEN 800 MG PO TABS: 800.0000 mg | ORAL_TABLET | Freq: Three times a day (TID) | ORAL | Status: DC

## 2016-03-14 MED ORDER — HYDROCODONE-ACETAMINOPHEN 5-325 MG PO TABS: 1.0000 | ORAL_TABLET | Freq: Four times a day (QID) | ORAL | Status: DC | PRN

## 2016-03-14 NOTE — ED Provider Notes (Signed)
CSN: [id number]     Arrival date & time 03/14/16  1504 History   First MD Initiated Contact with Patient 03/14/16 1533     Chief Complaint  Patient presents with  . Back Pain     (Consider location/radiation/quality/duration/timing/severity/associated sxs/prior Treatment) HPI   Patient is a 55 year old female with past medical history of chronic back pain, anxiety, fibromyalgia, COPD who presents the ED with complaint of low back pain, onset 3 days. Patient reports having constant sharp pain to her right lower back which she notes radiates down her right leg. She states she has had chronic right lower back pain over the past year after a fall resulting in a fractured vertebrae. She notes her last flare was a few months ago and she was given a cortisone shot at that time with relief of symptoms. Patient reports her back pain today is consistent with her typical back pain flares. She notes she has been taking ibuprofen and Flexeril at home with mild intermittent relief. Pt denies fever, numbness, tingling, saddle anesthesia, loss of bowel or bladder, weakness, IVDU, cancer or recent spinal manipulation. Denies any recent fall, trauma, injury.  Past Medical History  Diagnosis Date  . GERD (gastroesophageal reflux disease)   . Thyroid nodule   . Anxiety 11/11/11  . Asthma   . Herpes   . Seasonal allergies   . Arthritis   . Depression   . Fibromyalgia   . IBS (irritable bowel syndrome)   . DDD (degenerative disc disease)   . Bursitis     right shoulder  . Heart murmur     as a child per pt  . COPD (chronic obstructive pulmonary disease) ([place])   . Chronic back pain   . Lumbar radiculopathy   . Chronic neck pain   . Chronic shoulder pain    Past Surgical History  Procedure Laterality Date  . Hernia repair      Hiatal   . Cholecystectomy    . Tubal ligation  1983  . Hiatal hernia repair     Family History  Problem Relation Age of Onset  . Lung cancer Mother   . Arthritis  Mother     Rheumatoid  . Bursitis Mother   . Heart attack Father 71    Died of MI  . Obesity Father   . Fibromyalgia Sister   . Asthma Mother   . Asthma Child    Social History  Substance Use Topics  . Smoking status: Current Every Day Smoker -- 0.25 packs/day for 27 years    Types: Cigarettes  . Smokeless tobacco: Never Used  . Alcohol Use: No   OB History    No data available     Review of Systems  Musculoskeletal: Positive for back pain.  All other systems reviewed and are negative.     Allergies  Cymbalta; Levaquin; Lyrica; Shellfish allergy; Sulfamethoxazole; and Codeine  Home Medications   Prior to Admission medications   Medication Sig Start Date End Date Taking? Authorizing Provider  albuterol (PROVENTIL HFA;VENTOLIN HFA) 108 (90 BASE) MCG/ACT inhaler Inhale 2 puffs into the lungs every 6 (six) hours as needed for shortness of breath. Shortness of breath    Historical Provider, MD  albuterol (PROVENTIL) (2.5 MG/3ML) 0.083% nebulizer solution Take 2.5 mg by nebulization every 6 (six) hours as needed for shortness of breath. Shortness of breath    Historical Provider, MD  cyclobenzaprine (FLEXERIL) 5 MG tablet Take 1 tablet (5 mg total) by mouth 3 (three)  times daily as needed for muscle spasms. Patient not taking: Reported on 10/07/2015 01/11/15   Noland Fordyce, PA-C  HYDROcodone-acetaminophen (NORCO/VICODIN) 5-325 MG tablet Take 1 tablet by mouth every 6 (six) hours as needed. 03/14/16   Nona Dell, PA-C  ibuprofen (ADVIL,MOTRIN) 800 MG tablet Take 1 tablet (800 mg total) by mouth 3 (three) times daily. 03/14/16   Nona Dell, PA-C  loratadine (CLARITIN) 10 MG tablet Take 10 mg by mouth daily.    Historical Provider, MD  LORazepam (ATIVAN) 1 MG tablet Take 0.5 mg by mouth every 8 (eight) hours as needed for anxiety.  03/16/11   Alycia Rossetti, MD  pantoprazole (PROTONIX) 20 MG tablet Take 2 tablets (40 mg total) by mouth 2 (two) times  daily. Patient taking differently: Take 20 mg by mouth 2 (two) times daily.  04/07/13   Jessica D Zehr, PA-C  predniSONE (DELTASONE) 20 MG tablet Take 2 tablets (40 mg total) by mouth daily. 03/14/16   Nona Dell, PA-C  sertraline (ZOLOFT) 50 MG tablet Take 50 mg by mouth daily.    Historical Provider, MD  simethicone (GAS-X) 80 MG chewable tablet Chew 80 mg by mouth every 6 (six) hours as needed for flatulence.    Historical Provider, MD   BP 107/87 mmHg  Pulse 74  Temp(Src) 98.2 F (36.8 C) (Oral)  Resp 16  SpO2 100% Physical Exam  Constitutional: She is oriented to person, place, and time. She appears well-developed and well-nourished. No distress.  HENT:  Head: Normocephalic and atraumatic.  Eyes: Conjunctivae and EOM are normal. Right eye exhibits no discharge. Left eye exhibits no discharge. No scleral icterus.  Neck: Normal range of motion. Neck supple.  Cardiovascular: Normal rate, regular rhythm, normal heart sounds and intact distal pulses.   Pulmonary/Chest: Effort normal and breath sounds normal. No respiratory distress. She has no wheezes. She has no rales. She exhibits no tenderness.  Abdominal: Soft. Bowel sounds are normal. She exhibits no distension and no mass. There is no tenderness. There is no rebound and no guarding.  Musculoskeletal: She exhibits no edema.  No midline C, T, or L tenderness. TTP over right lumbar paraspinal muscles and right buttocks. Full range of motion of neck, decreased range of motion of back due to reported pain. Full range of motion of bilateral upper and lower extremities, with 5/5 strength. Sensation intact. 2+ radial and PT pulses. Cap refill <2 seconds. Patient able to stand and ambulate without assistance but endorses associated pain.    Neurological: She is alert and oriented to person, place, and time. She has normal strength and normal reflexes. No sensory deficit. Coordination normal.  Skin: Skin is warm and dry. She is not  diaphoretic.  Nursing note and vitals reviewed.   ED Course  Procedures (including critical care time) Labs Review Labs Reviewed - No data to display  Imaging Review No results found. I have personally reviewed and evaluated these images and lab results as part of my medical decision-making.   EKG Interpretation None      MDM   Final diagnoses:  Right-sided low back pain with right-sided sciatica    Patient with back pain consistent with chronic back pain and sciatica. Denies any recent fall, trauma, injury. VSS. Exam revealed tenderness over right lumbar paraspinal muscles, decreased range of motion of back due to reported pain. No neurological deficits and normal neuro exam.  Patient can walk but states is painful.  No loss of bowel or bladder  control.  No concern for cauda equina.  No fever, night sweats, weight loss, h/o cancer, IVDU.  Patient given pain meds, steroids and muscle relaxant in the ED. On reevaluation patient reports her pain has slightly improved. Plan to discharge patient home with symptomatic treatment including RICE protocol and pain medicine. Advised patient to follow up with her orthopedist, Dr. Sharol Given in the next 4-5 days.  Evaluation does not show pathology requring ongoing emergent intervention or admission. Pt is hemodynamically stable and mentating appropriately. All questions answered. Return precautions discussed and outpatient follow up given.     Chesley Noon [place], [place] 03/14/16 1644  Fredia Sorrow, MD 03/15/16 [phone]

## 2016-03-14 NOTE — Discharge Instructions (Signed)
Take your medications as prescribed. I recommend eating prior to taking her ibuprofen to prevent gastrointestinal side effects. You may also continue taking your Flexeril at home as prescribed. I recommend applying ice and/or heat to affected area for 15-20 minutes 3-4 times daily to help with pain. Follow-up with your orthopedist, Dr. Sharol Given, within the next week. Please return to the Emergency Department if symptoms worsen or new onset of fever, numbness, tingling, saddle anesthesia, loss of bowel or bladder, weakness.

## 2016-03-14 NOTE — ED Notes (Signed)
Hx of back pain x 1 year-worse since last night-states rx meds with no relief-slow gait-grimacing-states son drove pt here-brought to ED WR in w/c from door way of ED

## 2016-04-11 ENCOUNTER — Other Ambulatory Visit: Payer: Self-pay | Admitting: Orthopedic Surgery

## 2016-04-11 DIAGNOSIS — G8929 Other chronic pain: Secondary | ICD-10-CM

## 2016-04-11 DIAGNOSIS — M545 Low back pain, unspecified: Secondary | ICD-10-CM

## 2016-04-14 DIAGNOSIS — F17210 Nicotine dependence, cigarettes, uncomplicated: Secondary | ICD-10-CM | POA: Diagnosis not present

## 2016-04-14 DIAGNOSIS — F329 Major depressive disorder, single episode, unspecified: Secondary | ICD-10-CM | POA: Diagnosis not present

## 2016-04-14 DIAGNOSIS — M1990 Unspecified osteoarthritis, unspecified site: Secondary | ICD-10-CM | POA: Insufficient documentation

## 2016-04-14 DIAGNOSIS — J45909 Unspecified asthma, uncomplicated: Secondary | ICD-10-CM | POA: Diagnosis not present

## 2016-04-14 DIAGNOSIS — M545 Low back pain: Secondary | ICD-10-CM | POA: Diagnosis present

## 2016-04-14 DIAGNOSIS — M5431 Sciatica, right side: Secondary | ICD-10-CM | POA: Diagnosis not present

## 2016-04-14 DIAGNOSIS — J449 Chronic obstructive pulmonary disease, unspecified: Secondary | ICD-10-CM | POA: Diagnosis not present

## 2016-04-15 ENCOUNTER — Encounter (HOSPITAL_BASED_OUTPATIENT_CLINIC_OR_DEPARTMENT_OTHER): Payer: Self-pay | Admitting: Emergency Medicine

## 2016-04-15 ENCOUNTER — Emergency Department (HOSPITAL_BASED_OUTPATIENT_CLINIC_OR_DEPARTMENT_OTHER)
Admission: EM | Admit: 2016-04-15 | Discharge: 2016-04-15 | Disposition: A | Discharge status: Home / Self Care | Payer: Medicaid Other | Attending: Emergency Medicine | Admitting: Emergency Medicine

## 2016-04-15 DIAGNOSIS — M5431 Sciatica, right side: Secondary | ICD-10-CM

## 2016-04-15 MED ORDER — HYDROCODONE-ACETAMINOPHEN 5-325 MG PO TABS: 1.0000 | ORAL_TABLET | Freq: Four times a day (QID) | ORAL | Status: DC | PRN

## 2016-04-15 MED ORDER — HYDROCODONE-ACETAMINOPHEN 5-325 MG PO TABS
1.0000 | ORAL_TABLET | Freq: Once | ORAL | Status: AC
  Administered 2016-04-15: 1 via ORAL
  Filled 2016-04-15: qty 1

## 2016-04-15 NOTE — ED Notes (Signed)
Patient reports that she has pain to her her right back and down into her right leg. The patient saw Dr. Sharol Given last week for the same and is scheduled for an injection this week.

## 2016-04-15 NOTE — ED Provider Notes (Signed)
CSN: [id number]     Arrival date & time 04/14/16  2358 History   First MD Initiated Contact with Patient 04/15/16 0126     Chief Complaint  Patient presents with  . Back Pain     (Consider location/radiation/quality/duration/timing/severity/associated sxs/prior Treatment) HPI This is a 55 year old female with a long-standing history of low back pain for which she has been seen recently by Dr. Sharol Given. She is being scheduled for an injection this coming week. She is here with an acute exacerbation for the past several days. The pain is located in her middle lower lumbar region radiating down the lateral aspect of her right leg. There is associated paresthesias of the lateral right leg. Walking is difficult due to the pain. She describes the pain as severe and worse with movement or attempted ambulation. She has been taking 800 milligrams of ibuprofen 3 times a day without relief. This is causing her stomach to be upset.  Past Medical History  Diagnosis Date  . GERD (gastroesophageal reflux disease)   . Thyroid nodule   . Anxiety 11/11/11  . Asthma   . Herpes   . Seasonal allergies   . Arthritis   . Depression   . Fibromyalgia   . IBS (irritable bowel syndrome)   . DDD (degenerative disc disease)   . Bursitis     right shoulder  . Heart murmur     as a child per pt  . COPD (chronic obstructive pulmonary disease) ([place])   . Chronic back pain   . Lumbar radiculopathy   . Chronic neck pain   . Chronic shoulder pain    Past Surgical History  Procedure Laterality Date  . Hernia repair      Hiatal   . Cholecystectomy    . Tubal ligation  1983  . Hiatal hernia repair     Family History  Problem Relation Age of Onset  . Lung cancer Mother   . Arthritis Mother     Rheumatoid  . Bursitis Mother   . Heart attack Father 52    Died of MI  . Obesity Father   . Fibromyalgia Sister   . Asthma Mother   . Asthma Child    Social History  Substance Use Topics  . Smoking status:  Current Every Day Smoker -- 0.25 packs/day for 27 years    Types: Cigarettes  . Smokeless tobacco: Never Used  . Alcohol Use: No   OB History    No data available     Review of Systems  All other systems reviewed and are negative.   Allergies  Cymbalta; Levaquin; Lyrica; Shellfish allergy; Sulfamethoxazole; and Codeine  Home Medications   Prior to Admission medications   Medication Sig Start Date End Date Taking? Authorizing Provider  albuterol (PROVENTIL HFA;VENTOLIN HFA) 108 (90 BASE) MCG/ACT inhaler Inhale 2 puffs into the lungs every 6 (six) hours as needed for shortness of breath. Shortness of breath    Historical Provider, MD  albuterol (PROVENTIL) (2.5 MG/3ML) 0.083% nebulizer solution Take 2.5 mg by nebulization every 6 (six) hours as needed for shortness of breath. Shortness of breath    Historical Provider, MD  cyclobenzaprine (FLEXERIL) 5 MG tablet Take 1 tablet (5 mg total) by mouth 3 (three) times daily as needed for muscle spasms. Patient not taking: Reported on 10/07/2015 01/11/15   Noland Fordyce, PA-C  HYDROcodone-acetaminophen (NORCO/VICODIN) 5-325 MG tablet Take 1 tablet by mouth every 6 (six) hours as needed. 03/14/16   Nona Dell, PA-C  ibuprofen (ADVIL,MOTRIN) 800 MG tablet Take 1 tablet (800 mg total) by mouth 3 (three) times daily. 03/14/16   Nona Dell, PA-C  loratadine (CLARITIN) 10 MG tablet Take 10 mg by mouth daily.    Historical Provider, MD  LORazepam (ATIVAN) 1 MG tablet Take 0.5 mg by mouth every 8 (eight) hours as needed for anxiety.  03/16/11   Alycia Rossetti, MD  pantoprazole (PROTONIX) 20 MG tablet Take 2 tablets (40 mg total) by mouth 2 (two) times daily. Patient taking differently: Take 20 mg by mouth 2 (two) times daily.  04/07/13   Jessica D Zehr, PA-C  predniSONE (DELTASONE) 20 MG tablet Take 2 tablets (40 mg total) by mouth daily. 03/14/16   Nona Dell, PA-C  sertraline (ZOLOFT) 50 MG tablet Take 50 mg by  mouth daily.    Historical Provider, MD  simethicone (GAS-X) 80 MG chewable tablet Chew 80 mg by mouth every 6 (six) hours as needed for flatulence.    Historical Provider, MD   BP 119/73 mmHg  Pulse 74  Temp(Src) 97.7 F (36.5 C) (Oral)  Resp 18  Ht 5\' 3"  (1.6 m)  Wt 174 lb (78.926 kg)  BMI 30.83 kg/m2  SpO2 100%   Physical Exam  General: Well-developed, well-nourished female in no acute distress; appearance consistent with age of record HENT: normocephalic; atraumatic Eyes: Normal appearance Neck: supple Heart: regular rate and rhythm Lungs: clear to auscultation bilaterally Abdomen: soft; nondistended; nontender; bowel sounds present Back: Lower lumbar soft tissue tenderness Extremities: No deformity; full range of motion except right lower extremity due to pain; pulses normal; trace edema of lower legs Neurologic: Awake, alert and oriented; motor function intact in all extremities and symmetric except limited examination of right lower extremity due to pain; no facial droop Skin: Warm and dry Psychiatric: Normal mood and affect    ED Course  Procedures (including critical care time)   MDM      Shanon Rosser, MD 04/15/16 [phone]

## 2016-04-27 ENCOUNTER — Other Ambulatory Visit: Payer: Self-pay | Admitting: Orthopedic Surgery

## 2016-04-27 ENCOUNTER — Ambulatory Visit
Admission: RE | Admit: 2016-04-27 | Discharge: 2016-04-27 | Disposition: A | Discharge status: Home / Self Care | Payer: Medicaid Other | Source: Ambulatory Visit | Attending: Orthopedic Surgery | Admitting: Orthopedic Surgery

## 2016-04-27 DIAGNOSIS — M545 Low back pain, unspecified: Secondary | ICD-10-CM

## 2016-04-27 DIAGNOSIS — G8929 Other chronic pain: Secondary | ICD-10-CM

## 2016-04-27 MED ORDER — IOPAMIDOL (ISOVUE-M 200) INJECTION 41%
1.0000 mL | Freq: Once | INTRAMUSCULAR | Status: AC
  Administered 2016-04-27: 1 mL via EPIDURAL

## 2016-04-27 MED ORDER — METHYLPREDNISOLONE ACETATE 40 MG/ML INJ SUSP (RADIOLOG
120.0000 mg | Freq: Once | INTRAMUSCULAR | Status: AC
  Administered 2016-04-27: 120 mg via EPIDURAL

## 2016-04-27 NOTE — Discharge Instructions (Signed)

## 2016-06-08 ENCOUNTER — Telehealth: Payer: Self-pay

## 2016-06-08 NOTE — Telephone Encounter (Signed)
Returned patient's call to the nurses' desk regarding her needing another LESI for pain and burning in her legs after a nine-day trip to [facility].  I left her a message on her voice mail to have her doctor send [place] an order, then we will get her scheduled quickly.  jkl

## 2016-06-12 ENCOUNTER — Other Ambulatory Visit: Payer: Self-pay | Admitting: Orthopedic Surgery

## 2016-06-12 DIAGNOSIS — M5136 Other intervertebral disc degeneration, lumbar region: Secondary | ICD-10-CM

## 2016-06-13 ENCOUNTER — Ambulatory Visit
Admission: RE | Admit: 2016-06-13 | Discharge: 2016-06-13 | Disposition: A | Discharge status: Home / Self Care | Payer: Medicaid Other | Source: Ambulatory Visit | Attending: Orthopedic Surgery | Admitting: Orthopedic Surgery

## 2016-06-13 ENCOUNTER — Other Ambulatory Visit: Payer: Self-pay | Admitting: Orthopedic Surgery

## 2016-06-13 DIAGNOSIS — M5136 Other intervertebral disc degeneration, lumbar region: Secondary | ICD-10-CM

## 2016-06-13 DIAGNOSIS — J449 Chronic obstructive pulmonary disease, unspecified: Secondary | ICD-10-CM | POA: Insufficient documentation

## 2016-06-13 DIAGNOSIS — K219 Gastro-esophageal reflux disease without esophagitis: Secondary | ICD-10-CM | POA: Insufficient documentation

## 2016-06-13 MED ORDER — METHYLPREDNISOLONE ACETATE 40 MG/ML INJ SUSP (RADIOLOG
120.0000 mg | Freq: Once | INTRAMUSCULAR | Status: AC
  Administered 2016-06-13: 120 mg via EPIDURAL

## 2016-06-13 MED ORDER — IOPAMIDOL (ISOVUE-M 200) INJECTION 41%
1.0000 mL | Freq: Once | INTRAMUSCULAR | Status: AC
  Administered 2016-06-13: 1 mL via EPIDURAL

## 2016-06-13 NOTE — Discharge Instructions (Signed)

## 2016-06-15 ENCOUNTER — Telehealth: Payer: Self-pay | Admitting: Radiology

## 2016-06-20 ENCOUNTER — Encounter (HOSPITAL_BASED_OUTPATIENT_CLINIC_OR_DEPARTMENT_OTHER): Payer: Self-pay

## 2016-06-20 ENCOUNTER — Emergency Department (HOSPITAL_BASED_OUTPATIENT_CLINIC_OR_DEPARTMENT_OTHER)
Admission: EM | Admit: 2016-06-20 | Discharge: 2016-06-20 | Disposition: A | Discharge status: Home / Self Care | Payer: Medicaid Other | Attending: Emergency Medicine | Admitting: Emergency Medicine

## 2016-06-20 DIAGNOSIS — J45909 Unspecified asthma, uncomplicated: Secondary | ICD-10-CM | POA: Insufficient documentation

## 2016-06-20 DIAGNOSIS — F17210 Nicotine dependence, cigarettes, uncomplicated: Secondary | ICD-10-CM | POA: Insufficient documentation

## 2016-06-20 DIAGNOSIS — J449 Chronic obstructive pulmonary disease, unspecified: Secondary | ICD-10-CM | POA: Insufficient documentation

## 2016-06-20 DIAGNOSIS — M5441 Lumbago with sciatica, right side: Secondary | ICD-10-CM | POA: Diagnosis not present

## 2016-06-20 DIAGNOSIS — F329 Major depressive disorder, single episode, unspecified: Secondary | ICD-10-CM | POA: Insufficient documentation

## 2016-06-20 DIAGNOSIS — M545 Low back pain: Secondary | ICD-10-CM | POA: Diagnosis present

## 2016-06-20 LAB — URINALYSIS, ROUTINE W REFLEX MICROSCOPIC
Bilirubin Urine: NEGATIVE
Glucose, UA: NEGATIVE mg/dL
Hgb urine dipstick: NEGATIVE
Ketones, ur: NEGATIVE mg/dL
Nitrite: NEGATIVE
Protein, ur: NEGATIVE mg/dL
Specific Gravity, Urine: 1.008 (ref 1.005–1.030)
pH: 7 (ref 5.0–8.0)

## 2016-06-20 LAB — URINE MICROSCOPIC-ADD ON: RBC / HPF: NONE SEEN RBC/hpf (ref 0–5)

## 2016-06-20 MED ORDER — ONDANSETRON 4 MG PO TBDP
4.0000 mg | ORAL_TABLET | Freq: Once | ORAL | Status: AC | PRN
  Administered 2016-06-20: 4 mg via ORAL
  Filled 2016-06-20: qty 1

## 2016-06-20 MED ORDER — HYDROMORPHONE HCL 1 MG/ML IJ SOLN
1.0000 mg | Freq: Once | INTRAMUSCULAR | Status: AC
  Administered 2016-06-20: 1 mg via INTRAMUSCULAR
  Filled 2016-06-20: qty 1

## 2016-06-20 MED ORDER — HYDROCODONE-ACETAMINOPHEN 5-325 MG PO TABS: 1.0000 | ORAL_TABLET | ORAL | 0 refills | Status: DC | PRN

## 2016-06-20 NOTE — ED Provider Notes (Addendum)
[facility] DEPT [facility] Provider Note   CSN: [id number] Arrival date & time: 06/20/16  1550  First Provider Contact:  First MD Initiated Contact with Patient 06/20/16 1637        History   Chief Complaint Chief Complaint  Patient presents with  . Back Pain    HPI Sheri Rivera is a 55 y.o. female.  Patient presents with back pain. She has a history of chronic right back pain with radicular symptoms. She's followed by Dr. Sharol Given. She states about a month ago she had an injection of her back that significantly improved her symptoms. She then traveled to [place] where her back pain got worse. Last week she had an injection by Dr. Berenice Primas. She states following that she's had a marked increase in her back pain. She states the pain radiates down her right leg which it typically does although it feels worse. She has some numbness to the lateral aspect of her right leg which she has had in the past. She has urinary frequency but no burning on urination. No incontinence or urinary retention. No numbness to her perianal area.  She states that similar symptoms to what she's had in the past but worse and she got an injection. She contacted her orthopedic office who advised her that is common to have worsening symptoms after the injection and that she needs to wait about 2 weeks to see if she has any relief. She is currently taking over-the-counter medicines without improvement in symptoms.    Back Pain   Pertinent negatives include no fever, no numbness, no headaches and no weakness.    Past Medical History:  Diagnosis Date  . Anxiety 11/11/11  . Arthritis   . Asthma   . Bursitis    right shoulder  . Chronic back pain   . Chronic neck pain   . Chronic shoulder pain   . COPD (chronic obstructive pulmonary disease) ([place])   . DDD (degenerative disc disease)   . Depression   . Fibromyalgia   . GERD (gastroesophageal reflux disease)   . Heart murmur    as a child per pt  . Herpes   .  IBS (irritable bowel syndrome)   . Lumbar radiculopathy   . Seasonal allergies   . Thyroid nodule     Patient Active Problem List   Diagnosis Date Noted  . Acid reflux 06/13/2016  . Chronic obstructive pulmonary disease ([place]) 06/13/2016  . Current tobacco use 09/05/2015  . History of fracture of vertebra 02/16/2015  . Diffuse pain 04/03/2014  . Hemorrhage, postmenopausal 12/29/2013  . Chronic obstructive pulmonary disease with acute exacerbation ([place]) 10/30/2013  . COPD mixed type ([place]) 03/30/2013  . Abdominal pain, epigastric 02/25/2013  . Dysphagia, unspecified(787.20) 02/25/2013  . Gas 02/25/2013  . Abdominal bloating 02/25/2013  . Precordial pain 02/16/2013  . Multinodular goiter 10/05/2012  . 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 Bladder cystocele 07/21/2012  . Clinical depression 11/11/2011  . Allergic rhinitis 11/11/2011  . URI (upper respiratory infection) 09/28/2011  . Sore throat 07/18/2011  . Neck swelling 06/22/2011  . Ingrown right big toenail 05/23/2011  . Back pain 03/16/2011  . 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/05/2010  . Shortness of breath 03/15/2010  . GOITER, MULTINODULAR 07/01/2009  . FIBROMYALGIA 07/01/2009  . ALLERGIC RHINITIS CAUSE UNSPECIFIED 03/02/2008  . ANXIETY 01/23/2007  . TOBACCO DEPENDENCE 01/23/2007  . GASTROESOPHAGEAL REFLUX, NO ESOPHAGITIS 01/23/2007    Past Surgical History:  Procedure Laterality Date  . CHOLECYSTECTOMY    .  HERNIA REPAIR     [facility]   . HIATAL HERNIA REPAIR    . TUBAL LIGATION  1983    OB History    No data available       Home Medications    Prior to Admission medications   Medication Sig Start Date End Date Taking? Authorizing Provider  albuterol (PROVENTIL HFA;VENTOLIN HFA) 108 (90 BASE) MCG/ACT inhaler Inhale 2 puffs into the lungs every 6 (six) hours as needed for shortness of breath. Shortness of breath    Historical Provider, MD  albuterol (PROVENTIL) (2.5 MG/3ML) 0.083% nebulizer solution Take  2.5 mg by nebulization every 6 (six) hours as needed for shortness of breath. Shortness of breath    Historical Provider, MD  HYDROcodone-acetaminophen (NORCO/VICODIN) 5-325 MG tablet Take 1-2 tablets by mouth every 4 (four) hours as needed. 06/20/16   Malvin Johns, MD  ibuprofen (ADVIL,MOTRIN) 800 MG tablet Take 1 tablet (800 mg total) by mouth 3 (three) times daily. 03/14/16   Nona Dell, PA-C  loratadine (CLARITIN) 10 MG tablet Take 10 mg by mouth daily.    Historical Provider, MD  LORazepam (ATIVAN) 1 MG tablet Take 0.5 mg by mouth every 8 (eight) hours as needed for anxiety.  03/16/11   Alycia Rossetti, MD  pantoprazole (PROTONIX) 20 MG tablet Take 2 tablets (40 mg total) by mouth 2 (two) times daily. Patient taking differently: Take 20 mg by mouth 2 (two) times daily.  04/07/13   Jessica D Zehr, PA-C  sertraline (ZOLOFT) 50 MG tablet Take 50 mg by mouth daily.    Historical Provider, MD  simethicone (GAS-X) 80 MG chewable tablet Chew 80 mg by mouth every 6 (six) hours as needed for flatulence.    Historical Provider, MD    Family History Family History  Problem Relation Age of Onset  . Lung cancer Mother   . Arthritis Mother     Rheumatoid  . Bursitis Mother   . Asthma Mother   . Heart attack Father 32    Died of MI  . Obesity Father   . Fibromyalgia Sister   . Asthma Child     Social History Social History  Substance Use Topics  . Smoking status: Current Every Day Smoker    Packs/day: 0.25    Years: 27.00    Types: Cigarettes  . Smokeless tobacco: Never Used  . Alcohol use No     Allergies   Cymbalta [duloxetine hcl]; Levaquin [levofloxacin in d5w]; Lyrica [pregabalin]; Shellfish allergy; Sulfamethoxazole; and Codeine   Review of Systems Review of Systems  Constitutional: Negative for fever.  Gastrointestinal: Negative for nausea and vomiting.  Genitourinary: Positive for frequency. Negative for decreased urine volume and difficulty urinating.    Musculoskeletal: Positive for arthralgias, back pain and joint swelling. Negative for neck pain.  Skin: Negative for wound.  Neurological: Negative for weakness, numbness and headaches.     Physical Exam Updated Vital Signs BP 126/89 (BP Location: Left Arm)   Pulse 92   Temp 97.8 F (36.6 C) (Oral)   Resp 20   Ht 5\' 3"  (1.6 m)   Wt 184 lb (83.5 kg)   SpO2 93%   BMI 32.59 kg/m   Physical Exam  Constitutional: She is oriented to person, place, and time. She appears well-developed and well-nourished.  HENT:  Head: Normocephalic and atraumatic.  Eyes: Pupils are equal, round, and reactive to light.  Neck: Normal range of motion. Neck supple.  Cardiovascular: Normal rate, regular rhythm and normal  heart sounds.   Pulmonary/Chest: Effort normal and breath sounds normal. No respiratory distress. She has no wheezes. She has no rales. She exhibits no tenderness.  Abdominal: Soft. Bowel sounds are normal. There is no tenderness. There is no rebound and no guarding.  Musculoskeletal: Normal range of motion. She exhibits no edema.  Patient has tenderness to the right lower lumbar area and pain over the sciatic nerve area. She has normal sensation to the lower extremities bilaterally. She has normal motor function in the lower extremities. Patellar reflexes symmetric. Pedal pulses intact. There is no redness or swelling around the injection site  Lymphadenopathy:    She has no cervical adenopathy.  Neurological: She is alert and oriented to person, place, and time.  Skin: Skin is warm and dry. No rash noted.  Psychiatric: She has a normal mood and affect.     ED Treatments / Results  Labs (all labs ordered are listed, but only abnormal results are displayed) Labs Reviewed  URINALYSIS, ROUTINE W REFLEX MICROSCOPIC (NOT AT [facility]) - Abnormal; Notable for the following:       Result Value   Leukocytes, UA TRACE (*)    All other components within normal limits  URINE MICROSCOPIC-ADD ON -  Abnormal; Notable for the following:    Squamous Epithelial / LPF 0-5 (*)    Bacteria, UA FEW (*)    All other components within normal limits    EKG  EKG Interpretation None       Radiology No results found.  Procedures Procedures (including critical care time)  Medications Ordered in ED Medications  ondansetron (ZOFRAN-ODT) disintegrating tablet 4 mg (4 mg Oral Given 06/20/16 1623)  HYDROmorphone (DILAUDID) injection 1 mg (1 mg Intramuscular Given 06/20/16 1703)     Initial Impression / Assessment and Plan / ED Course  I have reviewed the triage vital signs and the nursing notes.  Pertinent labs & imaging results that were available during my care of the patient were reviewed by me and considered in my medical decision making (see chart for details).  Clinical Course    Patient is given dose of Dilaudid in the ED. She's been a little bit better. She has no evidence of cauda equina syndrome. She's neurologically intact. She was discharged home in good condition. She was given a prescription for a small amount of Vicodin tablets for symptomatic relief. She was encouraged to follow-up with her orthopedist. Return precautions were given.  Final Clinical Impressions(s) / ED Diagnoses   Final diagnoses:  Right-sided low back pain with right-sided sciatica    New Prescriptions New Prescriptions   HYDROCODONE-ACETAMINOPHEN (NORCO/VICODIN) 5-325 MG TABLET    Take 1-2 tablets by mouth every 4 (four) hours as needed.     Malvin Johns, MD 06/20/16 [place], MD 06/20/16 [phone]

## 2016-06-20 NOTE — ED Triage Notes (Signed)
Pt with chronic back pain-states she "had an injection [facility] last Thursday"-pain is worse-slow limping gait

## 2016-06-20 NOTE — ED Notes (Signed)
Pt stating that back pain radiates down R leg. Pt endorses frequent urge to urinate. Pt states the pain has been so intense that she has vomited x2 today.

## 2016-08-01 ENCOUNTER — Encounter (HOSPITAL_BASED_OUTPATIENT_CLINIC_OR_DEPARTMENT_OTHER): Payer: Self-pay | Admitting: *Deleted

## 2016-08-01 ENCOUNTER — Emergency Department (HOSPITAL_BASED_OUTPATIENT_CLINIC_OR_DEPARTMENT_OTHER): Payer: Medicaid Other

## 2016-08-01 ENCOUNTER — Emergency Department (HOSPITAL_BASED_OUTPATIENT_CLINIC_OR_DEPARTMENT_OTHER)
Admission: EM | Admit: 2016-08-01 | Discharge: 2016-08-02 | Disposition: A | Discharge status: Home / Self Care | Payer: Medicaid Other | Attending: Emergency Medicine | Admitting: Emergency Medicine

## 2016-08-01 DIAGNOSIS — R0602 Shortness of breath: Secondary | ICD-10-CM | POA: Diagnosis present

## 2016-08-01 DIAGNOSIS — F17210 Nicotine dependence, cigarettes, uncomplicated: Secondary | ICD-10-CM | POA: Diagnosis not present

## 2016-08-01 DIAGNOSIS — J45909 Unspecified asthma, uncomplicated: Secondary | ICD-10-CM | POA: Insufficient documentation

## 2016-08-01 DIAGNOSIS — J441 Chronic obstructive pulmonary disease with (acute) exacerbation: Secondary | ICD-10-CM | POA: Diagnosis not present

## 2016-08-01 MED ORDER — METHYLPREDNISOLONE SODIUM SUCC 125 MG IJ SOLR
125.0000 mg | Freq: Once | INTRAMUSCULAR | Status: AC
  Administered 2016-08-02: 125 mg via INTRAVENOUS
  Filled 2016-08-01: qty 2

## 2016-08-01 MED ORDER — ALBUTEROL SULFATE (2.5 MG/3ML) 0.083% IN NEBU
INHALATION_SOLUTION | RESPIRATORY_TRACT | Status: AC
  Administered 2016-08-01: 5 mg via RESPIRATORY_TRACT
  Filled 2016-08-01: qty 6

## 2016-08-01 MED ORDER — ALBUTEROL SULFATE (2.5 MG/3ML) 0.083% IN NEBU
5.0000 mg | INHALATION_SOLUTION | RESPIRATORY_TRACT | Status: AC
  Administered 2016-08-01: 5 mg via RESPIRATORY_TRACT

## 2016-08-01 MED ORDER — IPRATROPIUM BROMIDE 0.02 % IN SOLN
RESPIRATORY_TRACT | Status: AC
  Administered 2016-08-01: 0.5 mg via RESPIRATORY_TRACT
  Filled 2016-08-01: qty 2.5

## 2016-08-01 MED ORDER — IPRATROPIUM BROMIDE 0.02 % IN SOLN
0.5000 mg | RESPIRATORY_TRACT | Status: AC
Start: 2016-08-01 — End: 2016-08-01
  Administered 2016-08-01: 0.5 mg via RESPIRATORY_TRACT

## 2016-08-01 NOTE — ED Provider Notes (Signed)
[facility] DEPT [facility] Provider Note   CSN: [id number] Arrival date & time: 08/01/16  2312   History   Chief Complaint Chief Complaint  Patient presents with  . Shortness of Breath    HPI Sheri Rivera is a 55 y.o. female.  HPI   55 year old female presents today with complaints of shortness of breath. Patient reports that yesterday she started having upper respiratory congestion, cough, and minor shortness of breath. Patient reports history of asthma COPD, reports this felt like an exacerbation. Normally she has a symptoms she is able to use her inhaler. She reports that the inhaler provided no relief, and used her nebulizer at home yesterday which improved her symptoms. She reports today inhaler, nebulizer did not improve her symptoms, she felt short of breath and continue to worsen. She denies any fever, reports cough. She reports right lower pain in her back.  Patient denies any history of heart failure, notes that she spends evaluated by cardiology with stress test, cardiac cath, an echocardiogram which showed no signs of heart failure or cardiac dysfunction. Patient reports she had these workups due to lower extremity edema.  Past Medical History:  Diagnosis Date  . Anxiety 11/11/11  . Arthritis   . Asthma   . Bursitis    right shoulder  . Chronic back pain   . Chronic neck pain   . Chronic shoulder pain   . COPD (chronic obstructive pulmonary disease) ([place])   . DDD (degenerative disc disease)   . Depression   . Fibromyalgia   . GERD (gastroesophageal reflux disease)   . Heart murmur    as a child per pt  . Herpes   . IBS (irritable bowel syndrome)   . Lumbar radiculopathy   . Seasonal allergies   . Thyroid nodule     Patient Active Problem List   Diagnosis Date Noted  . Acid reflux 06/13/2016  . Chronic obstructive pulmonary disease ([place]) 06/13/2016  . Current tobacco use 09/05/2015  . History of fracture of vertebra 02/16/2015  . Diffuse pain 04/03/2014   . Hemorrhage, postmenopausal 12/29/2013  . Chronic obstructive pulmonary disease with acute exacerbation ([place]) 10/30/2013  . COPD mixed type ([place]) 03/30/2013  . Abdominal pain, epigastric 02/25/2013  . Dysphagia, unspecified(787.20) 02/25/2013  . Gas 02/25/2013  . Abdominal bloating 02/25/2013  . Precordial pain 02/16/2013  . Multinodular goiter 10/05/2012  . 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 Bladder cystocele 07/21/2012  . Clinical depression 11/11/2011  . Allergic rhinitis 11/11/2011  . URI (upper respiratory infection) 09/28/2011  . Sore throat 07/18/2011  . Neck swelling 06/22/2011  . Ingrown right big toenail 05/23/2011  . Back pain 03/16/2011  . 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/05/2010  . Shortness of breath 03/15/2010  . GOITER, MULTINODULAR 07/01/2009  . FIBROMYALGIA 07/01/2009  . ALLERGIC RHINITIS CAUSE UNSPECIFIED 03/02/2008  . ANXIETY 01/23/2007  . TOBACCO DEPENDENCE 01/23/2007  . GASTROESOPHAGEAL REFLUX, NO ESOPHAGITIS 01/23/2007    Past Surgical History:  Procedure Laterality Date  . CHOLECYSTECTOMY    . HERNIA REPAIR     [facility]   . HIATAL HERNIA REPAIR    . TUBAL LIGATION  1983    OB History    No data available       Home Medications    Prior to Admission medications   Medication Sig Start Date End Date Taking? Authorizing Provider  albuterol (PROVENTIL HFA;VENTOLIN HFA) 108 (90 BASE) MCG/ACT inhaler Inhale 2 puffs into the lungs every 6 (six) hours as needed for  shortness of breath. Shortness of breath    Historical Provider, MD  albuterol (PROVENTIL) (2.5 MG/3ML) 0.083% nebulizer solution Take 2.5 mg by nebulization every 6 (six) hours as needed for shortness of breath. Shortness of breath    Historical Provider, MD  doxycycline (VIBRAMYCIN) 100 MG capsule Take 1 capsule (100 mg total) by mouth 2 (two) times daily. 08/02/16   Okey Regal, PA-C  HYDROcodone-acetaminophen (NORCO/VICODIN) 5-325 MG tablet Take 1-2 tablets by mouth every 4 (four)  hours as needed. 06/20/16   Malvin Johns, MD  ibuprofen (ADVIL,MOTRIN) 800 MG tablet Take 1 tablet (800 mg total) by mouth 3 (three) times daily. 03/14/16   Nona Dell, PA-C  loratadine (CLARITIN) 10 MG tablet Take 10 mg by mouth daily.    Historical Provider, MD  LORazepam (ATIVAN) 1 MG tablet Take 0.5 mg by mouth every 8 (eight) hours as needed for anxiety.  03/16/11   Alycia Rossetti, MD  pantoprazole (PROTONIX) 20 MG tablet Take 2 tablets (40 mg total) by mouth 2 (two) times daily. Patient taking differently: Take 20 mg by mouth 2 (two) times daily.  04/07/13   Jessica D Zehr, PA-C  predniSONE (DELTASONE) 20 MG tablet Take 2 tablets (40 mg total) by mouth daily. 08/02/16   Okey Regal, PA-C  sertraline (ZOLOFT) 50 MG tablet Take 50 mg by mouth daily.    Historical Provider, MD  simethicone (GAS-X) 80 MG chewable tablet Chew 80 mg by mouth every 6 (six) hours as needed for flatulence.    Historical Provider, MD    Family History Family History  Problem Relation Age of Onset  . Lung cancer Mother   . Arthritis Mother     Rheumatoid  . Bursitis Mother   . Asthma Mother   . Heart attack Father 13    Died of MI  . Obesity Father   . Fibromyalgia Sister   . Asthma Child     Social History Social History  Substance Use Topics  . Smoking status: Current Every Day Smoker    Packs/day: 0.25    Years: 27.00    Types: Cigarettes  . Smokeless tobacco: Never Used  . Alcohol use No     Allergies   Cymbalta [duloxetine hcl]; Levaquin [levofloxacin in d5w]; Lyrica [pregabalin]; Shellfish allergy; Sulfamethoxazole; and Codeine   Review of Systems Review of Systems  All other systems reviewed and are negative.  Physical Exam Updated Vital Signs BP 119/83 (BP Location: Right Arm)   Pulse 102   Temp 97.9 F (36.6 C) (Oral)   Resp 26   Ht 5\' 3"  (1.6 m)   Wt 83.5 kg   SpO2 97%   BMI 32.59 kg/m   Physical Exam  Constitutional: She is oriented to person, place, and  time. She appears well-developed and well-nourished.  HENT:  Head: Normocephalic and atraumatic.  Eyes: Conjunctivae are normal. Pupils are equal, round, and reactive to light. Right eye exhibits no discharge. Left eye exhibits no discharge. No scleral icterus.  Neck: Normal range of motion. No JVD present. No tracheal deviation present.  Pulmonary/Chest: No stridor. She is in respiratory distress. She has wheezes.  Neurological: She is alert and oriented to person, place, and time. Coordination normal.  Psychiatric: She has a normal mood and affect. Her behavior is normal. Judgment and thought content normal.  Nursing note and vitals reviewed.    ED Treatments / Results  Labs (all labs ordered are listed, but only abnormal results are displayed) Labs Reviewed  CBC WITH DIFFERENTIAL/PLATELET - Abnormal; Notable for the following:       Result Value   WBC 13.0 (*)    Hemoglobin 15.1 (*)    Neutro Abs 9.4 (*)    All other components within normal limits  BASIC METABOLIC PANEL - Abnormal; Notable for the following:    Potassium 3.2 (*)    Glucose, Bld 122 (*)    Calcium 8.7 (*)    All other components within normal limits  BRAIN NATRIURETIC PEPTIDE  TROPONIN I    EKG  EKG Interpretation  Date/Time:  Wednesday August 01 2016 23:20:14 EDT Ventricular Rate:  102 PR Interval:    QRS Duration: 97 QT Interval:  353 QTC Calculation: 460 R Axis:   67 Text Interpretation:  Sinus tachycardia No significant change was found Confirmed by Florina Ou  MD, Jenny Reichmann ([id number]) on 08/01/2016 11:36:25 PM       Radiology Dg Chest 2 View  Result Date: 08/02/2016 CLINICAL DATA:  Asthma and COPD. EXAM: CHEST  2 VIEW COMPARISON:  Chest radiograph 09/08/2015 FINDINGS: There is shallow lung inflation. Cardiomediastinal silhouette is mildly enlarged. There are bilateral basilar predominant opacities in each. No pneumothorax or sizable pleural effusion. There is persistent interstitial prominence.  IMPRESSION: 1. Mild cardiomegaly with bilateral basilar predominant opacities, favored to represent pulmonary edema. 2. Interstitial prominence, likely secondary to underlying COPD. Electronically Signed   By: Ulyses Jarred M.D.   On: 08/02/2016 00:05    Procedures Procedures (including critical care time)  Medications Ordered in ED Medications  albuterol (PROVENTIL) (2.5 MG/3ML) 0.083% nebulizer solution 5 mg (5 mg Nebulization Given 08/01/16 2333)  ipratropium (ATROVENT) nebulizer solution 0.5 mg (0.5 mg Nebulization Given 08/01/16 2334)  methylPREDNISolone sodium succinate (SOLU-MEDROL) 125 mg/2 mL injection 125 mg (125 mg Intravenous Given 08/02/16 0023)  albuterol (PROVENTIL,VENTOLIN) solution continuous neb (10 mg/hr Nebulization Given 08/02/16 0055)  doxycycline (VIBRA-TABS) tablet 100 mg (100 mg Oral Given 08/02/16 0112)  LORazepam (ATIVAN) tablet 1 mg (1 mg Oral Given 08/02/16 0112)     Initial Impression / Assessment and Plan / ED Course  I have reviewed the triage vital signs and the nursing notes.  Pertinent labs & imaging results that were available during my care of the patient were reviewed by me and considered in my medical decision making (see chart for details).  Clinical Course     Final Clinical Impressions(s) / ED Diagnoses   Final diagnoses:  COPD exacerbation ([place])   Labs:  Imaging:  Consults:  Therapeutics:  Discharge Meds:   Assessment/Plan:  64 YOF presents today with complaints of SOB. Patient has history of COPD, she reports this feels like an exacerbation. Patient hypoxic upon my original exam. Patient had diffuse wheeze. Reassessment after first breathing treatment showed continued wheeze, she'll be started on continuous albuterol, Solu-Medrol given here in the ED.   Pt's presentation likely COPD exacerbation with questionable infectious process. Pt has no chest pain ACS/ PE related symptoms. Pt's was initially hypoxic. Pt is refusing hospital admission,  she would like [place] to treat her here and discharge her home. Pt care will be signed out to oncoming provider pending breathing treatment and re-evaluation.     New Prescriptions New Prescriptions   DOXYCYCLINE (VIBRAMYCIN) 100 MG CAPSULE    Take 1 capsule (100 mg total) by mouth 2 (two) times daily.   PREDNISONE (DELTASONE) 20 MG TABLET    Take 2 tablets (40 mg total) by mouth daily.     Okey Regal, PA-C 08/02/16  [id number]    Shanon Rosser, MD 08/02/16 [id number]

## 2016-08-01 NOTE — ED Triage Notes (Signed)
C/o sob, wheezing onset this am,  Also states ears are stopped up

## 2016-08-01 NOTE — ED Notes (Signed)
C/o sob, cough , wheezing onset this,  Also ears feel stopped up

## 2016-08-02 LAB — CBC WITH DIFFERENTIAL/PLATELET
Basophils Absolute: 0 10*3/uL (ref 0.0–0.1)
Basophils Relative: 0 %
Eosinophils Absolute: 0.1 10*3/uL (ref 0.0–0.7)
Eosinophils Relative: 1 %
HCT: 45.9 % (ref 36.0–46.0)
Hemoglobin: 15.1 g/dL — ABNORMAL HIGH (ref 12.0–15.0)
Lymphocytes Relative: 23 %
Lymphs Abs: 3 10*3/uL (ref 0.7–4.0)
MCH: 31.7 pg (ref 26.0–34.0)
MCHC: 32.9 g/dL (ref 30.0–36.0)
MCV: 96.2 fL (ref 78.0–100.0)
Monocytes Absolute: 0.4 10*3/uL (ref 0.1–1.0)
Monocytes Relative: 3 %
Neutro Abs: 9.4 10*3/uL — ABNORMAL HIGH (ref 1.7–7.7)
Neutrophils Relative %: 73 %
Platelets: 227 10*3/uL (ref 150–400)
RBC: 4.77 MIL/uL (ref 3.87–5.11)
RDW: 14.7 % (ref 11.5–15.5)
WBC: 13 10*3/uL — ABNORMAL HIGH (ref 4.0–10.5)

## 2016-08-02 LAB — BASIC METABOLIC PANEL
Anion gap: 6 (ref 5–15)
BUN: 11 mg/dL (ref 6–20)
CO2: 23 mmol/L (ref 22–32)
Calcium: 8.7 mg/dL — ABNORMAL LOW (ref 8.9–10.3)
Chloride: 109 mmol/L (ref 101–111)
Creatinine, Ser: 0.62 mg/dL (ref 0.44–1.00)
GFR calc Af Amer: >60 mL/min (ref >60)
GFR calc non Af Amer: >60 mL/min (ref >60)
Glucose, Bld: 122 mg/dL — ABNORMAL HIGH (ref 65–99)
Potassium: 3.2 mmol/L — ABNORMAL LOW (ref 3.5–5.1)
Sodium: 138 mmol/L (ref 135–145)

## 2016-08-02 LAB — TROPONIN I: Troponin I: <0.03 ng/mL (ref <0.03)

## 2016-08-02 LAB — BRAIN NATRIURETIC PEPTIDE: B Natriuretic Peptide: 75.6 pg/mL (ref 0.0–100.0)

## 2016-08-02 MED ORDER — ALBUTEROL (5 MG/ML) CONTINUOUS INHALATION SOLN
10.0000 mg/h | INHALATION_SOLUTION | Freq: Once | RESPIRATORY_TRACT | Status: AC
  Administered 2016-08-02: 10 mg/h via RESPIRATORY_TRACT
  Filled 2016-08-02: qty 20

## 2016-08-02 MED ORDER — DOXYCYCLINE HYCLATE 100 MG PO TABS
100.0000 mg | ORAL_TABLET | Freq: Once | ORAL | Status: AC
  Administered 2016-08-02: 100 mg via ORAL
  Filled 2016-08-02: qty 1

## 2016-08-02 MED ORDER — PREDNISONE 20 MG PO TABS: 40.0000 mg | ORAL_TABLET | Freq: Every day | ORAL | 0 refills | Status: DC

## 2016-08-02 MED ORDER — DOXYCYCLINE HYCLATE 100 MG PO CAPS: 100.0000 mg | ORAL_CAPSULE | Freq: Two times a day (BID) | ORAL | 0 refills | Status: DC

## 2016-08-02 MED ORDER — LORAZEPAM 1 MG PO TABS
1.0000 mg | ORAL_TABLET | Freq: Once | ORAL | Status: AC
  Administered 2016-08-02: 1 mg via ORAL
  Filled 2016-08-02: qty 1

## 2016-08-02 MED ORDER — ALBUTEROL SULFATE (2.5 MG/3ML) 0.083% IN NEBU: 2.5000 mg | INHALATION_SOLUTION | RESPIRATORY_TRACT | 0 refills | Status: DC | PRN

## 2016-08-02 NOTE — Discharge Instructions (Signed)
Please use medication as directed, Please return immediately if any new or worsening signs or symptoms present. Please follow up with your primary care provider tomorrow for re-evaluation.

## 2016-09-16 ENCOUNTER — Encounter (HOSPITAL_BASED_OUTPATIENT_CLINIC_OR_DEPARTMENT_OTHER): Payer: Self-pay | Admitting: Emergency Medicine

## 2016-09-16 ENCOUNTER — Emergency Department (HOSPITAL_BASED_OUTPATIENT_CLINIC_OR_DEPARTMENT_OTHER)
Admission: EM | Admit: 2016-09-16 | Discharge: 2016-09-16 | Disposition: A | Discharge status: Home / Self Care | Payer: Medicaid Other | Attending: Emergency Medicine | Admitting: Emergency Medicine

## 2016-09-16 DIAGNOSIS — H6691 Otitis media, unspecified, right ear: Secondary | ICD-10-CM | POA: Diagnosis not present

## 2016-09-16 DIAGNOSIS — J45909 Unspecified asthma, uncomplicated: Secondary | ICD-10-CM | POA: Diagnosis not present

## 2016-09-16 DIAGNOSIS — J449 Chronic obstructive pulmonary disease, unspecified: Secondary | ICD-10-CM | POA: Insufficient documentation

## 2016-09-16 DIAGNOSIS — M5441 Lumbago with sciatica, right side: Secondary | ICD-10-CM | POA: Insufficient documentation

## 2016-09-16 DIAGNOSIS — F17210 Nicotine dependence, cigarettes, uncomplicated: Secondary | ICD-10-CM | POA: Diagnosis not present

## 2016-09-16 DIAGNOSIS — M5431 Sciatica, right side: Secondary | ICD-10-CM

## 2016-09-16 DIAGNOSIS — Z7951 Long term (current) use of inhaled steroids: Secondary | ICD-10-CM | POA: Insufficient documentation

## 2016-09-16 DIAGNOSIS — H9201 Otalgia, right ear: Secondary | ICD-10-CM | POA: Diagnosis present

## 2016-09-16 MED ORDER — CETIRIZINE HCL 10 MG PO TABS: 10.0000 mg | ORAL_TABLET | Freq: Every day | ORAL | 0 refills | Status: DC

## 2016-09-16 MED ORDER — AMOXICILLIN 500 MG PO TABS: 500.0000 mg | ORAL_TABLET | Freq: Two times a day (BID) | ORAL | 0 refills | Status: AC

## 2016-09-16 MED ORDER — HYDROCODONE-IBUPROFEN 7.5-200 MG PO TABS: 1.0000 | ORAL_TABLET | Freq: Four times a day (QID) | ORAL | 0 refills | Status: DC | PRN

## 2016-09-16 MED ORDER — DEXAMETHASONE SODIUM PHOSPHATE 10 MG/ML IJ SOLN
10.0000 mg | Freq: Once | INTRAMUSCULAR | Status: AC
  Administered 2016-09-16: 10 mg via INTRAMUSCULAR
  Filled 2016-09-16: qty 1

## 2016-09-16 MED ORDER — HYDROCODONE-ACETAMINOPHEN 5-325 MG PO TABS
1.0000 | ORAL_TABLET | Freq: Once | ORAL | Status: AC
  Administered 2016-09-16: 1 via ORAL
  Filled 2016-09-16: qty 1

## 2016-09-16 NOTE — ED Triage Notes (Signed)
Pt in c/o lower back pain that radiates down leg x yesterday and bilateral ear pain like previous ear infections onset 3 days ago. Pt is alert, interative, ambulatory with slow but steady gait in NAD.

## 2016-09-16 NOTE — ED Provider Notes (Signed)
[facility] DEPT [facility] Provider Note   CSN: [id number] Arrival date & time: 09/16/16  1411     History   Chief Complaint Chief Complaint  Patient presents with  . Back Pain  . Otalgia    HPI Sheri Rivera is a 55 y.o. female.  HPI Sheri Rivera is a 55 y.o. female with PMH significant for COPD, chronic back pain, chronic neck pain, FM, IBS, asthma who presents with 2 complaints.  1.  Patient presents with constant, severe, right sided low back pain that radiates down her right leg.  Known hx of sciatica.  Feels similar to previous flare ups.  States yesterday she was at [employer] with her grandchildren when her pain began.  No weakness, fever, chills, abdominal pain, b/b incontinence, urinary retention, or dysuria.  Has been taking Ibuprofen without relief.  Followed by Dr. Sharol Given with [facility] orthopedics.  2.  Also complaining of bilateral (R>L) otalgia taht began 3 days ago.  States it feels like her ears are full and would feel better if she just pops them.  No trauma or injury.  No otorrhea, fever, chills, sore throat, nasal congestion.  Has been taking Ibuprofen without relief.  No visual changes.   Past Medical History:  Diagnosis Date  . Anxiety 11/11/11  . Arthritis   . Asthma   . Bursitis    right shoulder  . Chronic back pain   . Chronic neck pain   . Chronic shoulder pain   . COPD (chronic obstructive pulmonary disease) ([place])   . DDD (degenerative disc disease)   . Depression   . Fibromyalgia   . GERD (gastroesophageal reflux disease)   . Heart murmur    as a child per pt  . Herpes   . IBS (irritable bowel syndrome)   . Lumbar radiculopathy   . Seasonal allergies   . Thyroid nodule     Patient Active Problem List   Diagnosis Date Noted  . Acid reflux 06/13/2016  . Chronic obstructive pulmonary disease ([place]) 06/13/2016  . Current tobacco use 09/05/2015  . History of fracture of vertebra 02/16/2015  . Diffuse pain 04/03/2014  . Hemorrhage,  postmenopausal 12/29/2013  . Chronic obstructive pulmonary disease with acute exacerbation ([place]) 10/30/2013  . COPD mixed type ([place]) 03/30/2013  . Abdominal pain, epigastric 02/25/2013  . Dysphagia, unspecified(787.20) 02/25/2013  . Gas 02/25/2013  . Abdominal bloating 02/25/2013  . Precordial pain 02/16/2013  . Multinodular goiter 10/05/2012  . 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 Bladder cystocele 07/21/2012  . Clinical depression 11/11/2011  . Allergic rhinitis 11/11/2011  . URI (upper respiratory infection) 09/28/2011  . Sore throat 07/18/2011  . Neck swelling 06/22/2011  . Ingrown right big toenail 05/23/2011  . Back pain 03/16/2011  . 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/05/2010  . Shortness of breath 03/15/2010  . GOITER, MULTINODULAR 07/01/2009  . FIBROMYALGIA 07/01/2009  . ALLERGIC RHINITIS CAUSE UNSPECIFIED 03/02/2008  . ANXIETY 01/23/2007  . TOBACCO DEPENDENCE 01/23/2007  . GASTROESOPHAGEAL REFLUX, NO ESOPHAGITIS 01/23/2007    Past Surgical History:  Procedure Laterality Date  . CHOLECYSTECTOMY    . HERNIA REPAIR     [place]   . HIATAL HERNIA REPAIR    . TUBAL LIGATION  1983    OB History    No data available       Home Medications    Prior to Admission medications   Medication Sig Start Date End Date Taking? Authorizing Provider  albuterol (PROVENTIL HFA;VENTOLIN HFA) 108 (90  BASE) MCG/ACT inhaler Inhale 2 puffs into the lungs every 6 (six) hours as needed for shortness of breath. Shortness of breath    Historical Provider, MD  albuterol (PROVENTIL) (2.5 MG/3ML) 0.083% nebulizer solution Take 3 mLs (2.5 mg total) by nebulization every 4 (four) hours as needed for wheezing or shortness of breath. Shortness of breath 08/02/16   Shanon Rosser, MD  amoxicillin (AMOXIL) 500 MG tablet Take 1 tablet (500 mg total) by mouth 2 (two) times daily. 09/16/16 09/23/16  Gloriann Loan, PA-C  doxycycline (VIBRAMYCIN) 100 MG capsule Take 1 capsule (100 mg total) by mouth 2 (two)  times daily. 08/02/16   Okey Regal, PA-C  HYDROcodone-acetaminophen (NORCO/VICODIN) 5-325 MG tablet Take 1-2 tablets by mouth every 4 (four) hours as needed. 06/20/16   Malvin Johns, MD  HYDROcodone-ibuprofen (VICOPROFEN) 7.5-200 MG tablet Take 1 tablet by mouth every 6 (six) hours as needed for moderate pain. 09/16/16   Gloriann Loan, PA-C  ibuprofen (ADVIL,MOTRIN) 800 MG tablet Take 1 tablet (800 mg total) by mouth 3 (three) times daily. 03/14/16   Nona Dell, PA-C  loratadine (CLARITIN) 10 MG tablet Take 10 mg by mouth daily.    Historical Provider, MD  LORazepam (ATIVAN) 1 MG tablet Take 0.5 mg by mouth every 8 (eight) hours as needed for anxiety.  03/16/11   Alycia Rossetti, MD  pantoprazole (PROTONIX) 20 MG tablet Take 2 tablets (40 mg total) by mouth 2 (two) times daily. Patient taking differently: Take 20 mg by mouth 2 (two) times daily.  04/07/13   Jessica D Zehr, PA-C  predniSONE (DELTASONE) 20 MG tablet Take 2 tablets (40 mg total) by mouth daily. 08/02/16   Okey Regal, PA-C  sertraline (ZOLOFT) 50 MG tablet Take 50 mg by mouth daily.    Historical Provider, MD  simethicone (GAS-X) 80 MG chewable tablet Chew 80 mg by mouth every 6 (six) hours as needed for flatulence.    Historical Provider, MD    Family History Family History  Problem Relation Age of Onset  . Lung cancer Mother   . Arthritis Mother     Rheumatoid  . Bursitis Mother   . Asthma Mother   . Heart attack Father 36    Died of MI  . Obesity Father   . Fibromyalgia Sister   . Asthma Child     Social History Social History  Substance Use Topics  . Smoking status: Current Every Day Smoker    Packs/day: 0.25    Years: 27.00    Types: Cigarettes  . Smokeless tobacco: Never Used  . Alcohol use No     Allergies   Cymbalta [duloxetine hcl]; Levaquin [levofloxacin in d5w]; Lyrica [pregabalin]; Shellfish allergy; Sulfamethoxazole; and Codeine   Review of Systems Review of Systems All other systems  negative unless otherwise stated in HPI   Physical Exam Updated Vital Signs BP 110/82   Pulse 83   Temp 97.8 F (36.6 C)   Resp 20   Ht 5\' 3"  (1.6 m)   Wt 83.5 kg   SpO2 97%   BMI 32.59 kg/m   Physical Exam  Constitutional: She is oriented to person, place, and time. She appears well-developed and well-nourished.  HENT:  Head: Atraumatic.  Right Ear: Hearing and ear canal normal. No mastoid tenderness. Tympanic membrane is erythematous and bulging. A middle ear effusion is present.  Left Ear: Hearing and ear canal normal. No mastoid tenderness. A middle ear effusion is present.  Eyes: Conjunctivae are normal.  Cardiovascular: Normal rate, regular rhythm, normal heart sounds and intact distal pulses.   Pulses:      Dorsalis pedis pulses are 2+ on the right side, and 2+ on the left side.  Pulmonary/Chest: Effort normal and breath sounds normal.  Abdominal: Soft. Bowel sounds are normal. She exhibits no distension. There is no tenderness.  Musculoskeletal: She exhibits tenderness.  Right sided lumbar tenderness. No spinous process tenderness.  No step offs. No crepitus.  Neurological: She is alert and oriented to person, place, and time.  No saddle anesthesia. Strength and sensation intact bilaterally throughout lower extremities. No foot drop. Normal gait.   Skin: Skin is warm and dry.  Psychiatric: She has a normal mood and affect. Her behavior is normal.     ED Treatments / Results  Labs (all labs ordered are listed, but only abnormal results are displayed) Labs Reviewed - No data to display  EKG  EKG Interpretation None       Radiology No results found.  Procedures Procedures (including critical care time)  Medications Ordered in ED Medications  dexamethasone (DECADRON) injection 10 mg (10 mg Intramuscular Given 09/16/16 1509)  HYDROcodone-acetaminophen (NORCO/VICODIN) 5-325 MG per tablet 1 tablet (1 tablet Oral Given 09/16/16 1509)     Initial  Impression / Assessment and Plan / ED Course  I have reviewed the triage vital signs and the nursing notes.  Pertinent labs & imaging results that were available during my care of the patient were reviewed by me and considered in my medical decision making (see chart for details).  Clinical Course   Patient presents with low back pain.  VSS.  No injury/trauma.  No red flags.  No neurological deficits.  Distal pulses intact.  No gait abnormalities.  Suspect sciatica.  Doubt cauda equina.  Doubt infectious process.  Doubt AAA.  Treated with Decadron and hydrocodone.  Discussed return precautions to the ED.  Follow up with Dr. Sharol Given.  Evidence of AOM on the right, will treat with amoxicillin. Return precautions discussed.  Stable for discharge.   Final Clinical Impressions(s) / ED Diagnoses   Final diagnoses:  Right otitis media, unspecified otitis media type  Sciatica of right side    New Prescriptions Discharge Medication List as of 09/16/2016  3:15 PM    START taking these medications   Details  amoxicillin (AMOXIL) 500 MG tablet Take 1 tablet (500 mg total) by mouth 2 (two) times daily., Starting Sun 09/16/2016, Until Sun 09/23/2016, Print    HYDROcodone-ibuprofen (VICOPROFEN) 7.5-200 MG tablet Take 1 tablet by mouth every 6 (six) hours as needed for moderate pain., Starting Sun 09/16/2016, Print         Gloriann Loan, PA-C 09/16/16 [place], MD 09/17/16 [phone]

## 2017-02-19 ENCOUNTER — Emergency Department (HOSPITAL_BASED_OUTPATIENT_CLINIC_OR_DEPARTMENT_OTHER)
Admission: EM | Admit: 2017-02-19 | Discharge: 2017-02-19 | Disposition: A | Discharge status: Home / Self Care | Payer: Medicaid Other | Attending: Physician Assistant | Admitting: Physician Assistant

## 2017-02-19 ENCOUNTER — Encounter (HOSPITAL_BASED_OUTPATIENT_CLINIC_OR_DEPARTMENT_OTHER): Payer: Self-pay

## 2017-02-19 DIAGNOSIS — M545 Low back pain: Secondary | ICD-10-CM | POA: Diagnosis present

## 2017-02-19 DIAGNOSIS — F17210 Nicotine dependence, cigarettes, uncomplicated: Secondary | ICD-10-CM | POA: Diagnosis not present

## 2017-02-19 DIAGNOSIS — M5441 Lumbago with sciatica, right side: Secondary | ICD-10-CM | POA: Diagnosis not present

## 2017-02-19 DIAGNOSIS — M5431 Sciatica, right side: Secondary | ICD-10-CM

## 2017-02-19 DIAGNOSIS — Z79899 Other long term (current) drug therapy: Secondary | ICD-10-CM | POA: Diagnosis not present

## 2017-02-19 DIAGNOSIS — J45909 Unspecified asthma, uncomplicated: Secondary | ICD-10-CM | POA: Diagnosis not present

## 2017-02-19 DIAGNOSIS — J441 Chronic obstructive pulmonary disease with (acute) exacerbation: Secondary | ICD-10-CM | POA: Diagnosis not present

## 2017-02-19 MED ORDER — HYDROCODONE-ACETAMINOPHEN 5-325 MG PO TABS: 1.0000 | ORAL_TABLET | ORAL | 0 refills | Status: DC | PRN

## 2017-02-19 MED ORDER — DICLOFENAC SODIUM 1 % TD GEL: 2.0000 g | Freq: Three times a day (TID) | TRANSDERMAL | 0 refills | Status: DC | PRN

## 2017-02-19 MED ORDER — HYDROCODONE-ACETAMINOPHEN 5-325 MG PO TABS
1.0000 | ORAL_TABLET | Freq: Once | ORAL | Status: AC
  Administered 2017-02-19: 1 via ORAL
  Filled 2017-02-19: qty 1

## 2017-02-19 NOTE — ED Provider Notes (Signed)
[facility] DEPT [facility] Provider Note   CSN: [id number] Arrival date & time: 02/19/17  1921    By signing my name below, I, Macon Large, attest that this documentation has been prepared under the direction and in the presence of Kestrel Mis Julio Alm, MD. Electronically Signed: Macon Large, ED Scribe. 02/19/17. 9:14 PM.  History   Chief Complaint Chief Complaint  Patient presents with  . Back Pain   The history is provided by the patient and a relative. No language interpreter was used.   HPI Comments: Sheri Rivera is a 56 y.o. female with PMHx of DDD, lumbar radiculopathy, chronic back pain who presents to the Emergency Department complaining of 10/10, gradually worsening, severe, chronic lower back pain onset two days ago. Pt denies recent trauma, injury, heavy lifting, falls, twisting, bending. Pt is ambulatory with minimal difficulty. No h/o cancer and/or back surgery. She notes her pain begins around her "sciatic nerve and butt check" and radiates down to the lateral aspect of her right leg. Pt describes her pain as if "someone beat me there". Per pt, she is followed by an orthopedic for her chronic back pain and receives up to 3 injections every six months. She notes she received her last dose this past month. Pt reports taking 3 doses of (800mg ) ibuprofen pills with no significant relief. Per pt's relative, he states pt has trouble getting in and out of the shower. She states her back pain is worsened with direct pressure and ambulation. She denies fever, numbness, focal weakness or paresthesia of the lower extremities.   Past Medical History:  Diagnosis Date  . Anxiety 11/11/11  . Arthritis   . Asthma   . Bursitis    right shoulder  . Chronic back pain   . Chronic neck pain   . Chronic shoulder pain   . COPD (chronic obstructive pulmonary disease) ([place])   . DDD (degenerative disc disease)   . Depression   . Fibromyalgia   . GERD (gastroesophageal reflux  disease)   . Heart murmur    as a child per pt  . Herpes   . IBS (irritable bowel syndrome)   . Lumbar radiculopathy   . Seasonal allergies   . Thyroid nodule     Patient Active Problem List   Diagnosis Date Noted  . Acid reflux 06/13/2016  . Chronic obstructive pulmonary disease ([place]) 06/13/2016  . Current tobacco use 09/05/2015  . History of fracture of vertebra 02/16/2015  . Diffuse pain 04/03/2014  . Hemorrhage, postmenopausal 12/29/2013  . Chronic obstructive pulmonary disease with acute exacerbation ([place]) 10/30/2013  . COPD mixed type ([place]) 03/30/2013  . Abdominal pain, epigastric 02/25/2013  . Dysphagia, unspecified(787.20) 02/25/2013  . Gas 02/25/2013  . Abdominal bloating 02/25/2013  . Precordial pain 02/16/2013  . Multinodular goiter 10/05/2012  . 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 Bladder cystocele 07/21/2012  . Clinical depression 11/11/2011  . Allergic rhinitis 11/11/2011  . URI (upper respiratory infection) 09/28/2011  . Sore throat 07/18/2011  . Neck swelling 06/22/2011  . Ingrown right big toenail 05/23/2011  . Back pain 03/16/2011  . 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/05/2010  . Shortness of breath 03/15/2010  . GOITER, MULTINODULAR 07/01/2009  . FIBROMYALGIA 07/01/2009  . ALLERGIC RHINITIS CAUSE UNSPECIFIED 03/02/2008  . ANXIETY 01/23/2007  . TOBACCO DEPENDENCE 01/23/2007  . GASTROESOPHAGEAL REFLUX, NO ESOPHAGITIS 01/23/2007    Past Surgical History:  Procedure Laterality Date  . CHOLECYSTECTOMY    . HERNIA REPAIR     [place]   .  HIATAL HERNIA REPAIR    . TUBAL LIGATION  1983    OB History    No data available       Home Medications    Prior to Admission medications   Medication Sig Start Date End Date Taking? Authorizing Provider  albuterol (PROVENTIL HFA;VENTOLIN HFA) 108 (90 BASE) MCG/ACT inhaler Inhale 2 puffs into the lungs every 6 (six) hours as needed for shortness of breath. Shortness of breath    Historical Provider, MD    albuterol (PROVENTIL) (2.5 MG/3ML) 0.083% nebulizer solution Take 3 mLs (2.5 mg total) by nebulization every 4 (four) hours as needed for wheezing or shortness of breath. Shortness of breath 08/02/16   Shanon Rosser, MD  diclofenac sodium (VOLTAREN) 1 % GEL Apply 2 g topically 3 (three) times daily as needed. 02/19/17   Ameilia Rattan Lyn Sarann Tregre, MD  doxycycline (VIBRAMYCIN) 100 MG capsule Take 1 capsule (100 mg total) by mouth 2 (two) times daily. 08/02/16   Okey Regal, PA-C  HYDROcodone-acetaminophen (NORCO/VICODIN) 5-325 MG tablet Take 1-2 tablets by mouth every 4 (four) hours as needed. 06/20/16   Malvin Johns, MD  HYDROcodone-acetaminophen (NORCO/VICODIN) 5-325 MG tablet Take 1 tablet by mouth every 4 (four) hours as needed. 02/19/17   Ziaire Bieser Lyn Dacie Mandel, MD  HYDROcodone-ibuprofen (VICOPROFEN) 7.5-200 MG tablet Take 1 tablet by mouth every 6 (six) hours as needed for moderate pain. 09/16/16   Gloriann Loan, PA-C  ibuprofen (ADVIL,MOTRIN) 800 MG tablet Take 1 tablet (800 mg total) by mouth 3 (three) times daily. 03/14/16   Nona Dell, PA-C  loratadine (CLARITIN) 10 MG tablet Take 10 mg by mouth daily.    Historical Provider, MD  LORazepam (ATIVAN) 1 MG tablet Take 0.5 mg by mouth every 8 (eight) hours as needed for anxiety.  03/16/11   Alycia Rossetti, MD  pantoprazole (PROTONIX) 20 MG tablet Take 2 tablets (40 mg total) by mouth 2 (two) times daily. Patient taking differently: Take 20 mg by mouth 2 (two) times daily.  04/07/13   Jessica D Zehr, PA-C  predniSONE (DELTASONE) 20 MG tablet Take 2 tablets (40 mg total) by mouth daily. 08/02/16   Okey Regal, PA-C  sertraline (ZOLOFT) 50 MG tablet Take 50 mg by mouth daily.    Historical Provider, MD  simethicone (GAS-X) 80 MG chewable tablet Chew 80 mg by mouth every 6 (six) hours as needed for flatulence.    Historical Provider, MD    Family History Family History  Problem Relation Age of Onset  . Lung cancer Mother   . Arthritis Mother      Rheumatoid  . Bursitis Mother   . Asthma Mother   . Heart attack Father 17    Died of MI  . Obesity Father   . Fibromyalgia Sister   . Asthma Child     Social History Social History  Substance Use Topics  . Smoking status: Current Every Day Smoker    Packs/day: 0.25    Years: 27.00    Types: Cigarettes  . Smokeless tobacco: Never Used  . Alcohol use No     Allergies   Cymbalta [duloxetine hcl]; Levaquin [levofloxacin in d5w]; Lyrica [pregabalin]; Shellfish allergy; Sulfamethoxazole; and Codeine   Review of Systems Review of Systems  Constitutional: Negative for fever.  Musculoskeletal: Positive for back pain.  Neurological: Negative for weakness and numbness.  All other systems reviewed and are negative.    Physical Exam Updated Vital Signs BP 118/79 (BP Location: Left Arm)   Pulse 99  Temp 97.9 F (36.6 C) (Oral)   Resp (!) 22   Ht 5\' 3"  (1.6 m)   Wt 183 lb (83 kg)   SpO2 96%   BMI 32.42 kg/m   Physical Exam  Constitutional: She appears well-developed and well-nourished.  Disability off work due to chronic back pain.   HENT:  Head: Normocephalic and atraumatic.  Eyes: Conjunctivae are normal. Right eye exhibits no discharge. Left eye exhibits no discharge.  Pulmonary/Chest: Effort normal. No respiratory distress.  Musculoskeletal:  Good strength and sensationally distally.   Neurological: She is alert. Coordination normal.  Skin: Skin is warm and dry. No rash noted. She is not diaphoretic. No erythema.  Psychiatric: She has a normal mood and affect.  Nursing note and vitals reviewed.    ED Treatments / Results   DIAGNOSTIC STUDIES: Oxygen Saturation is 96% on RA, adequate by my interpretation.    COORDINATION OF CARE: 9:02 PM Discussed treatment plan with pt at bedside which includes pain medication and pt agreed to plan.   Labs (all labs ordered are listed, but only abnormal results are displayed) Labs Reviewed - No data to  display  EKG  EKG Interpretation None       Radiology No results found.  Procedures Procedures (including critical care time)  Medications Ordered in ED Medications  HYDROcodone-acetaminophen (NORCO/VICODIN) 5-325 MG per tablet 1 tablet (1 tablet Oral Given 02/19/17 2112)     Initial Impression / Assessment and Plan / ED Course  I have reviewed the triage vital signs and the nursing notes.  Pertinent labs & imaging results that were available during my care of the patient were reviewed by me and considered in my medical decision making (see chart for details).     I personally performed the services described in this documentation, which was scribed in my presence. The recorded information has been reviewed and is accurate.   Pt here with chronic back pain. Seen by ortho regularly, has PCP follow up. No new injuries. No red flags, gave 5 pills to get her to PCP apointment.   Doubt new patholoyg, will have her follow up [phsyician she curretnly seens for the same issue.   Final Clinical Impressions(s) / ED Diagnoses   Final diagnoses:  Sciatica of right side    New Prescriptions New Prescriptions   DICLOFENAC SODIUM (VOLTAREN) 1 % GEL    Apply 2 g topically 3 (three) times daily as needed.   HYDROCODONE-ACETAMINOPHEN (NORCO/VICODIN) 5-325 MG TABLET    Take 1 tablet by mouth every 4 (four) hours as needed.     Leilanee Righetti Julio Alm, MD 02/20/17 1506

## 2017-02-19 NOTE — ED Triage Notes (Signed)
Pt c/o chronic lower back pain for the last two days that has gone unrelieved with ibuprofen and flexeril

## 2017-02-19 NOTE — ED Notes (Signed)
Pt outside smoking when called for a room

## 2017-02-19 NOTE — ED Notes (Signed)
ED Provider at bedside. 

## 2017-02-19 NOTE — Discharge Instructions (Signed)
Please follow up with Dr. Sharol Given, and your primary care for further care of your back pain.

## 2017-04-15 ENCOUNTER — Encounter (HOSPITAL_BASED_OUTPATIENT_CLINIC_OR_DEPARTMENT_OTHER): Payer: Self-pay | Admitting: Emergency Medicine

## 2017-04-15 ENCOUNTER — Emergency Department (HOSPITAL_BASED_OUTPATIENT_CLINIC_OR_DEPARTMENT_OTHER): Payer: Medicaid Other

## 2017-04-15 ENCOUNTER — Emergency Department (HOSPITAL_BASED_OUTPATIENT_CLINIC_OR_DEPARTMENT_OTHER)
Admission: EM | Admit: 2017-04-15 | Discharge: 2017-04-16 | Disposition: A | Discharge status: Home / Self Care | Payer: Medicaid Other | Attending: Physician Assistant | Admitting: Physician Assistant

## 2017-04-15 DIAGNOSIS — S3992XA Unspecified injury of lower back, initial encounter: Secondary | ICD-10-CM | POA: Diagnosis present

## 2017-04-15 DIAGNOSIS — Y9389 Activity, other specified: Secondary | ICD-10-CM | POA: Diagnosis not present

## 2017-04-15 DIAGNOSIS — W19XXXA Unspecified fall, initial encounter: Secondary | ICD-10-CM

## 2017-04-15 DIAGNOSIS — Y92009 Unspecified place in unspecified non-institutional (private) residence as the place of occurrence of the external cause: Secondary | ICD-10-CM | POA: Diagnosis not present

## 2017-04-15 DIAGNOSIS — S22070A Wedge compression fracture of T9-T10 vertebra, initial encounter for closed fracture: Secondary | ICD-10-CM | POA: Diagnosis not present

## 2017-04-15 DIAGNOSIS — Z79899 Other long term (current) drug therapy: Secondary | ICD-10-CM | POA: Diagnosis not present

## 2017-04-15 DIAGNOSIS — S22000A Wedge compression fracture of unspecified thoracic vertebra, initial encounter for closed fracture: Secondary | ICD-10-CM

## 2017-04-15 DIAGNOSIS — F17210 Nicotine dependence, cigarettes, uncomplicated: Secondary | ICD-10-CM | POA: Diagnosis not present

## 2017-04-15 DIAGNOSIS — W010XXA Fall on same level from slipping, tripping and stumbling without subsequent striking against object, initial encounter: Secondary | ICD-10-CM | POA: Insufficient documentation

## 2017-04-15 DIAGNOSIS — J45909 Unspecified asthma, uncomplicated: Secondary | ICD-10-CM | POA: Insufficient documentation

## 2017-04-15 DIAGNOSIS — Y999 Unspecified external cause status: Secondary | ICD-10-CM | POA: Insufficient documentation

## 2017-04-15 NOTE — ED Provider Notes (Signed)
[facility] DEPT [facility] Provider Note   CSN: [id number] Arrival date & time: 04/15/17  1913  By signing my name below, I, Sheri Rivera, attest that this documentation has been prepared under the direction and in the presence of non-physician practitioner, Haeley Fordham, Harrell Gave, PA-C. Electronically Signed: Theresia Rivera, ED Scribe. 04/15/17. 10:23 PM.  History   Chief Complaint Chief Complaint  Patient presents with  . Fall   HPI HPI Comments: Sheri Rivera is a 56 y.o. female who presents to the Emergency Department complaining of a mechanical, ground level fall that occurred 5 hours ago. Per pt, she was carrying 4 grocery bags when she tripped over her dog and fell on her back. Pt reports associated symptoms of back pain and bilateral upper extremity numbness. Pt describes her back pain as a burning sensation. No treatments tried prior to arrival. No other complaints at this time.  Past Medical History:  Diagnosis Date  . Anxiety 11/11/11  . Arthritis   . Asthma   . Bursitis    right shoulder  . Chronic back pain   . Chronic neck pain   . Chronic shoulder pain   . COPD (chronic obstructive pulmonary disease) ([place])   . DDD (degenerative disc disease)   . Depression   . Fibromyalgia   . GERD (gastroesophageal reflux disease)   . Heart murmur    as a child per pt  . Herpes   . IBS (irritable bowel syndrome)   . Lumbar radiculopathy   . Seasonal allergies   . Thyroid nodule     Patient Active Problem List   Diagnosis Date Noted  . Acid reflux 06/13/2016  . Chronic obstructive pulmonary disease ([place]) 06/13/2016  . Current tobacco use 09/05/2015  . History of fracture of vertebra 02/16/2015  . Diffuse pain 04/03/2014  . Hemorrhage, postmenopausal 12/29/2013  . Chronic obstructive pulmonary disease with acute exacerbation ([place]) 10/30/2013  . COPD mixed type ([place]) 03/30/2013  . Abdominal pain, epigastric 02/25/2013  . Dysphagia, unspecified(787.20) 02/25/2013  .  Gas 02/25/2013  . Abdominal bloating 02/25/2013  . Precordial pain 02/16/2013  . Multinodular goiter 10/05/2012  . 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 Bladder cystocele 07/21/2012  . Clinical depression 11/11/2011  . Allergic rhinitis 11/11/2011  . URI (upper respiratory infection) 09/28/2011  . Sore throat 07/18/2011  . Neck swelling 06/22/2011  . Ingrown right big toenail 05/23/2011  . Back pain 03/16/2011  . 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/05/2010  . Shortness of breath 03/15/2010  . GOITER, MULTINODULAR 07/01/2009  . FIBROMYALGIA 07/01/2009  . ALLERGIC RHINITIS CAUSE UNSPECIFIED 03/02/2008  . ANXIETY 01/23/2007  . TOBACCO DEPENDENCE 01/23/2007  . GASTROESOPHAGEAL REFLUX, NO ESOPHAGITIS 01/23/2007    Past Surgical History:  Procedure Laterality Date  . CHOLECYSTECTOMY    . HERNIA REPAIR     Hiatal   . HIATAL HERNIA REPAIR    . TUBAL LIGATION  1983    OB History    No data available       Home Medications    Prior to Admission medications   Medication Sig Start Date End Date Taking? Authorizing Provider  albuterol (PROVENTIL HFA;VENTOLIN HFA) 108 (90 BASE) MCG/ACT inhaler Inhale 2 puffs into the lungs every 6 (six) hours as needed for shortness of breath. Shortness of breath    [provider]  albuterol (PROVENTIL) (2.5 MG/3ML) 0.083% nebulizer solution Take 3 mLs (2.5 mg total) by nebulization every 4 (four) hours as needed for wheezing or shortness of breath. Shortness of breath  08/02/16   Molpus, John, MD  diclofenac sodium (VOLTAREN) 1 % GEL Apply 2 g topically 3 (three) times daily as needed. 02/19/17   Mackuen, Courteney Lyn, MD  doxycycline (VIBRAMYCIN) 100 MG capsule Take 1 capsule (100 mg total) by mouth 2 (two) times daily. 08/02/16   Hedges, Dellis Filbert, PA-C  HYDROcodone-acetaminophen (NORCO/VICODIN) 5-325 MG tablet Take 1-2 tablets by mouth every 4 (four) hours as needed. 06/20/16   Malvin Johns, MD  HYDROcodone-acetaminophen (NORCO/VICODIN) 5-325  MG tablet Take 1 tablet by mouth every 4 (four) hours as needed. 02/19/17   Mackuen, Courteney Lyn, MD  HYDROcodone-ibuprofen (VICOPROFEN) 7.5-200 MG tablet Take 1 tablet by mouth every 6 (six) hours as needed for moderate pain. 09/16/16   Gloriann Loan, PA-C  ibuprofen (ADVIL,MOTRIN) 800 MG tablet Take 1 tablet (800 mg total) by mouth 3 (three) times daily. 03/14/16   Nona Dell, PA-C  loratadine (CLARITIN) 10 MG tablet Take 10 mg by mouth daily.    [provider]  LORazepam (ATIVAN) 1 MG tablet Take 0.5 mg by mouth every 8 (eight) hours as needed for anxiety.  03/16/11   Alycia Rossetti, MD  pantoprazole (PROTONIX) 20 MG tablet Take 2 tablets (40 mg total) by mouth 2 (two) times daily. Patient taking differently: Take 20 mg by mouth 2 (two) times daily.  04/07/13   Zehr, Laban Emperor, PA-C  predniSONE (DELTASONE) 20 MG tablet Take 2 tablets (40 mg total) by mouth daily. 08/02/16   Hedges, Dellis Filbert, PA-C  sertraline (ZOLOFT) 50 MG tablet Take 50 mg by mouth daily.    [provider]  simethicone (GAS-X) 80 MG chewable tablet Chew 80 mg by mouth every 6 (six) hours as needed for flatulence.    [provider]    Family History Family History  Problem Relation Age of Onset  . Lung cancer Mother   . Arthritis Mother        Rheumatoid  . Bursitis Mother   . Asthma Mother   . Heart attack Father 76       Died of MI  . Obesity Father   . Fibromyalgia Sister   . Asthma Child     Social History Social History  Substance Use Topics  . Smoking status: Current Every Day Smoker    Packs/day: 0.25    Years: 27.00    Types: Cigarettes  . Smokeless tobacco: Never Used  . Alcohol use No     Allergies   Cymbalta [duloxetine hcl]; Levaquin [levofloxacin in d5w]; Lyrica [pregabalin]; Shellfish allergy; Sulfamethoxazole; and Codeine   Review of Systems Review of Systems   Physical Exam Updated Vital Signs BP 112/79 (BP Location: Left Arm)   Pulse 66    Temp 98.1 F (36.7 C) (Oral)   Resp 18   Ht 5\' 3"  (1.6 m)   Wt 178 lb (80.7 kg)   SpO2 96%   BMI 31.53 kg/m   Physical Exam  Constitutional: She is oriented to person, place, and time. She appears well-developed and well-nourished. No distress.  HENT:  Head: Normocephalic and atraumatic.  Mouth/Throat: Oropharynx is clear and moist.  Eyes: Pupils are equal, round, and reactive to light.  Neck: Normal range of motion. Neck supple.  Cardiovascular: Normal rate, regular rhythm and normal heart sounds.  Exam reveals no gallop and no friction rub.   No murmur heard. Pulmonary/Chest: Effort normal. No respiratory distress.  Abdominal: Soft.  Musculoskeletal: She exhibits tenderness. She exhibits no deformity.  Pain to the paraspinal area  of the lower thoracic region. Pain above her right shoulder blade.   Neurological: She is alert and oriented to person, place, and time. She displays normal reflexes. No sensory deficit. She exhibits normal muscle tone. Coordination normal.  Skin: Skin is warm and dry. No rash noted. No erythema.  Psychiatric: She has a normal mood and affect. Her behavior is normal.  Nursing note and vitals reviewed.    ED Treatments / Results  DIAGNOSTIC STUDIES: Oxygen Saturation is 96% on RA, adequate by my interpretation.   COORDINATION OF CARE: 10:25 PM-Discussed next steps with pt including XR. Pt verbalized understanding and is agreeable with the plan.   Labs (all labs ordered are listed, but only abnormal results are displayed) Labs Reviewed - No data to display  EKG  EKG Interpretation None       Radiology No results found.  Procedures Procedures (including critical care time)  Medications Ordered in ED Medications - No data to display   Initial Impression / Assessment and Plan / ED Course  I have reviewed the triage vital signs and the nursing notes.  Pertinent labs & imaging results that were available during my care of the patient  were reviewed by me and considered in my medical decision making (see chart for details).     Patient refuses CT scan of the thoracic spine.  She does not want any further imaging.  Patient would like to be discharged home.  I did give her referral to neurosurgery did advise her that this compression fracture can worsen, nonswollen.  Follow-up is important.  The patient also has an injury to that shoulder.  No neck pain.  I do not know that I could not fully explain the tingling in her fingers, but I do feel that it is related to the shoulder injury rather than the neck since she has no pain in her neck Final Clinical Impressions(s) / ED Diagnoses   Final diagnoses:  None    New Prescriptions New Prescriptions   No medications on file  I personally performed the services described in this documentation, which was scribed in my presence. The recorded information has been reviewed and is accurate.   Dalia Heading, PA-C 04/16/17 0130    Macarthur Critchley, MD 04/16/17 [phone]

## 2017-04-15 NOTE — ED Triage Notes (Addendum)
Patient states that she fell earlier today and hurt her mid back. The patient reports that when she woke up from a nap she started to have numbness and tingling to her right arm and hand. Patient is anxious  - stroke screen is negative

## 2017-04-15 NOTE — ED Notes (Signed)
Pt returned into dept

## 2017-04-15 NOTE — ED Notes (Signed)
Pt out in hall way demanding her d/c papers, states cant wait so going to her PCP tomorrow for a MRI

## 2017-04-15 NOTE — ED Notes (Signed)
Pt seen walking out of the dept.  No distress noted.

## 2017-04-16 MED ORDER — TRAMADOL HCL 50 MG PO TABS: 50.0000 mg | ORAL_TABLET | Freq: Four times a day (QID) | ORAL | 0 refills | Status: DC | PRN

## 2017-04-16 MED ORDER — PREDNISONE 50 MG PO TABS: 50.0000 mg | ORAL_TABLET | Freq: Every day | ORAL | 0 refills | Status: DC

## 2017-04-16 MED ORDER — CYCLOBENZAPRINE HCL 10 MG PO TABS: 10.0000 mg | ORAL_TABLET | Freq: Every day | ORAL | 0 refills | Status: DC

## 2017-04-16 NOTE — Discharge Instructions (Signed)
Return here as needed. Follow up with the Neurosurgeon provided.

## 2017-06-18 ENCOUNTER — Emergency Department (HOSPITAL_COMMUNITY)
Admission: EM | Admit: 2017-06-18 | Discharge: 2017-06-18 | Disposition: A | Discharge status: Home / Self Care | Payer: Medicaid Other | Attending: Emergency Medicine | Admitting: Emergency Medicine

## 2017-06-18 ENCOUNTER — Encounter (HOSPITAL_COMMUNITY): Payer: Self-pay | Admitting: Emergency Medicine

## 2017-06-18 DIAGNOSIS — Z79899 Other long term (current) drug therapy: Secondary | ICD-10-CM | POA: Insufficient documentation

## 2017-06-18 DIAGNOSIS — W1849XA Other slipping, tripping and stumbling without falling, initial encounter: Secondary | ICD-10-CM | POA: Insufficient documentation

## 2017-06-18 DIAGNOSIS — S91202A Unspecified open wound of left great toe with damage to nail, initial encounter: Secondary | ICD-10-CM | POA: Insufficient documentation

## 2017-06-18 DIAGNOSIS — J449 Chronic obstructive pulmonary disease, unspecified: Secondary | ICD-10-CM | POA: Diagnosis not present

## 2017-06-18 DIAGNOSIS — Y9301 Activity, walking, marching and hiking: Secondary | ICD-10-CM | POA: Insufficient documentation

## 2017-06-18 DIAGNOSIS — Y92000 Kitchen of unspecified non-institutional (private) residence as  the place of occurrence of the external cause: Secondary | ICD-10-CM | POA: Diagnosis not present

## 2017-06-18 DIAGNOSIS — S91209A Unspecified open wound of unspecified toe(s) with damage to nail, initial encounter: Secondary | ICD-10-CM

## 2017-06-18 DIAGNOSIS — Z23 Encounter for immunization: Secondary | ICD-10-CM | POA: Insufficient documentation

## 2017-06-18 DIAGNOSIS — Y999 Unspecified external cause status: Secondary | ICD-10-CM | POA: Insufficient documentation

## 2017-06-18 DIAGNOSIS — F17210 Nicotine dependence, cigarettes, uncomplicated: Secondary | ICD-10-CM | POA: Diagnosis not present

## 2017-06-18 DIAGNOSIS — J45909 Unspecified asthma, uncomplicated: Secondary | ICD-10-CM | POA: Diagnosis not present

## 2017-06-18 MED ORDER — BACITRACIN ZINC 500 UNIT/GM EX OINT: 1.0000 "application " | TOPICAL_OINTMENT | Freq: Two times a day (BID) | CUTANEOUS | 0 refills | Status: DC

## 2017-06-18 MED ORDER — TETANUS-DIPHTH-ACELL PERTUSSIS 5-2.5-18.5 LF-MCG/0.5 IM SUSP
0.5000 mL | Freq: Once | INTRAMUSCULAR | Status: AC
  Administered 2017-06-18: 0.5 mL via INTRAMUSCULAR
  Filled 2017-06-18: qty 0.5

## 2017-06-18 MED ORDER — KETOROLAC TROMETHAMINE 30 MG/ML IJ SOLN
15.0000 mg | Freq: Once | INTRAMUSCULAR | Status: AC
  Administered 2017-06-18: 15 mg via INTRAMUSCULAR
  Filled 2017-06-18: qty 1

## 2017-06-18 NOTE — Discharge Instructions (Signed)
Wound care All dirt and debris were clean from the wound today.  Your tetanus shot was updated. Keep the wound dry for 48 hours. After 48 hours have passed, lightly wash the finger or toe in warm, soapy water 2-3 times a day. This helps to reduce pain and swelling and prevent infection. Dressing Care Cover the wound with a clean gauze bandage (dressing). You may be able to stop wearing a dressing after 2?7 days. Wash your hands with soap and water before you change your dressing. If soap and water are not available, use hand sanitizer. Change the dressing once or twice a day. Always change the dressing: If the dressing gets wet or dirty. After washing your finger or toe. You may apply bacitracin ointment to the area prior to applying the next bandage.  Medicine Take over-the-counter pain medicine as needed. Do not take aspirin or products containing aspirin unless directed by your health care provider. These products can increase bleeding. General instructions Keep the hand or foot with the nail injury raised above the level of your heart as much as possible. This helps to reduce pain and swelling. Move the toe or finger often to avoid stiffness. Do not smoke. Smoking can delay healing. If you need help quitting, talk to your health care provider. Contact a health care provider if: You have swelling or pain that gets worse instead of better. You have fluid, blood, or pus coming from your wound. Your wound smells bad. Get help right away if: You have bleeding that does not stop, even when you apply pressure to the wound. You have a temperature that is higher than 104F (40C). You cannot move your fingers or toes. The affected finger or toe looks white or black.

## 2017-06-18 NOTE — ED Provider Notes (Signed)
[facility] DEPT Provider Note   CSN: [id number] Arrival date & time: 06/18/17  1410     History   Chief Complaint Chief Complaint  Patient presents with  . Toe Injury    HPI Sheri Rivera is a 56 y.o. female presents to the emergency department today after tripping and ripping her left great toenail off this morning. Patient states that around 8:30 this morning she was crossing over from her living room into her kitchen when she tripped over the metal that separates the carpet from the hardwood floors. She states that merely following the event she noticed that she had part of her left great toe nail and had significant bleeding. She says that the bleeding continued until around 1 PM and was controlled with pressure and gauze. Now she notes a throbbing sensation where the toenail has been removed. She called into her podiatry office to will see her on Thursday at 10:30 AM for this but stated that she may need to go to the emergency department to get an updated tetanus shot and make sure that is not become infected already. Patient has no numbness or tingling distal to the injury. No decreased range of motion, weakness, swelling, erythema, discharge.  HPI  Past Medical History:  Diagnosis Date  . Anxiety 11/11/11  . Arthritis   . Asthma   . Bursitis    right shoulder  . Chronic back pain   . Chronic neck pain   . Chronic shoulder pain   . COPD (chronic obstructive pulmonary disease) ([place])   . DDD (degenerative disc disease)   . Depression   . Fibromyalgia   . GERD (gastroesophageal reflux disease)   . Heart murmur    as a child per pt  . Herpes   . IBS (irritable bowel syndrome)   . Lumbar radiculopathy   . Seasonal allergies   . Thyroid nodule     Patient Active Problem List   Diagnosis Date Noted  . Acid reflux 06/13/2016  . Chronic obstructive pulmonary disease ([place]) 06/13/2016  . Current tobacco use 09/05/2015  . History of fracture of vertebra 02/16/2015    . Diffuse pain 04/03/2014  . Hemorrhage, postmenopausal 12/29/2013  . Chronic obstructive pulmonary disease with acute exacerbation ([place]) 10/30/2013  . COPD mixed type ([place]) 03/30/2013  . Abdominal pain, epigastric 02/25/2013  . Dysphagia, unspecified(787.20) 02/25/2013  . Gas 02/25/2013  . Abdominal bloating 02/25/2013  . Precordial pain 02/16/2013  . Multinodular goiter 10/05/2012  . 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 Bladder cystocele 07/21/2012  . Clinical depression 11/11/2011  . Allergic rhinitis 11/11/2011  . URI (upper respiratory infection) 09/28/2011  . Sore throat 07/18/2011  . Neck swelling 06/22/2011  . Ingrown right big toenail 05/23/2011  . Back pain 03/16/2011  . 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/05/2010  . Shortness of breath 03/15/2010  . GOITER, MULTINODULAR 07/01/2009  . FIBROMYALGIA 07/01/2009  . ALLERGIC RHINITIS CAUSE UNSPECIFIED 03/02/2008  . ANXIETY 01/23/2007  . TOBACCO DEPENDENCE 01/23/2007  . GASTROESOPHAGEAL REFLUX, NO ESOPHAGITIS 01/23/2007    Past Surgical History:  Procedure Laterality Date  . CHOLECYSTECTOMY    . HERNIA REPAIR     Hiatal   . HIATAL HERNIA REPAIR    . TUBAL LIGATION  1983    OB History    No data available       Home Medications    Prior to Admission medications   Medication Sig Start Date End Date Taking? Authorizing Provider  albuterol (PROVENTIL HFA;VENTOLIN HFA)  108 (90 BASE) MCG/ACT inhaler Inhale 2 puffs into the lungs every 6 (six) hours as needed for shortness of breath. Shortness of breath    [provider]  albuterol (PROVENTIL) (2.5 MG/3ML) 0.083% nebulizer solution Take 3 mLs (2.5 mg total) by nebulization every 4 (four) hours as needed for wheezing or shortness of breath. Shortness of breath 08/02/16   Molpus, John, MD  bacitracin ointment Apply 1 application topically 2 (two) times daily. 06/18/17   Alaya Iverson, Barth Kirks, PA-C  cyclobenzaprine (FLEXERIL) 10 MG tablet Take 1 tablet (10 mg total)  by mouth at bedtime. 04/16/17   Lawyer, Harrell Gave, PA-C  diclofenac sodium (VOLTAREN) 1 % GEL Apply 2 g topically 3 (three) times daily as needed. 02/19/17   Mackuen, Courteney Lyn, MD  doxycycline (VIBRAMYCIN) 100 MG capsule Take 1 capsule (100 mg total) by mouth 2 (two) times daily. 08/02/16   Hedges, Dellis Filbert, PA-C  HYDROcodone-acetaminophen (NORCO/VICODIN) 5-325 MG tablet Take 1-2 tablets by mouth every 4 (four) hours as needed. 06/20/16   Malvin Johns, MD  HYDROcodone-acetaminophen (NORCO/VICODIN) 5-325 MG tablet Take 1 tablet by mouth every 4 (four) hours as needed. 02/19/17   Mackuen, Courteney Lyn, MD  HYDROcodone-ibuprofen (VICOPROFEN) 7.5-200 MG tablet Take 1 tablet by mouth every 6 (six) hours as needed for moderate pain. 09/16/16   Gloriann Loan, PA-C  ibuprofen (ADVIL,MOTRIN) 800 MG tablet Take 1 tablet (800 mg total) by mouth 3 (three) times daily. 03/14/16   Nona Dell, PA-C  loratadine (CLARITIN) 10 MG tablet Take 10 mg by mouth daily.    [provider]  LORazepam (ATIVAN) 1 MG tablet Take 0.5 mg by mouth every 8 (eight) hours as needed for anxiety.  03/16/11   Alycia Rossetti, MD  pantoprazole (PROTONIX) 20 MG tablet Take 2 tablets (40 mg total) by mouth 2 (two) times daily. Patient taking differently: Take 20 mg by mouth 2 (two) times daily.  04/07/13   Zehr, Laban Emperor, PA-C  predniSONE (DELTASONE) 50 MG tablet Take 1 tablet (50 mg total) by mouth daily. 04/16/17   Lawyer, Harrell Gave, PA-C  sertraline (ZOLOFT) 50 MG tablet Take 50 mg by mouth daily.    [provider]  simethicone (GAS-X) 80 MG chewable tablet Chew 80 mg by mouth every 6 (six) hours as needed for flatulence.    [provider]  traMADol (ULTRAM) 50 MG tablet Take 1 tablet (50 mg total) by mouth every 6 (six) hours as needed for severe pain. 04/16/17   Dalia Heading, PA-C    Family History Family History  Problem Relation Age of Onset  . Lung cancer Mother   . Arthritis  Mother        Rheumatoid  . Bursitis Mother   . Asthma Mother   . Heart attack Father 32       Died of MI  . Obesity Father   . Fibromyalgia Sister   . Asthma Child     Social History Social History  Substance Use Topics  . Smoking status: Current Every Day Smoker    Packs/day: 0.25    Years: 27.00    Types: Cigarettes  . Smokeless tobacco: Never Used  . Alcohol use No     Allergies   Cymbalta [duloxetine hcl]; Levaquin [levofloxacin in d5w]; Lyrica [pregabalin]; Shellfish allergy; Sulfamethoxazole; and Codeine   Review of Systems Review of Systems  Constitutional: Negative for fever.  Musculoskeletal: Positive for arthralgias.  Skin:       Left great toe nail avulsed.  Neurological: Negative for weakness and numbness.     Physical Exam Updated Vital Signs BP (!) 143/79 (BP Location: Left Arm)   Pulse 62   Temp 97.7 F (36.5 C) (Oral)   Resp 20   Ht 5\' 3"  (1.6 m)   Wt 78.9 kg (174 lb)   SpO2 93%   BMI 30.82 kg/m   Physical Exam  Constitutional: She appears well-developed and well-nourished.  Nontoxic appearing.  HENT:  Head: Normocephalic and atraumatic.  Right Ear: External ear normal.  Left Ear: External ear normal.  Eyes: Conjunctivae are normal. Right eye exhibits no discharge. Left eye exhibits no discharge. No scleral icterus.  Pulmonary/Chest: Effort normal. No respiratory distress.  Musculoskeletal:       Left ankle: Normal. She exhibits normal range of motion. No tenderness. Achilles tendon normal.       Left foot: There is normal range of motion, no swelling and no laceration.  The patient is with partial avulsion to the left great toe. It appears that the middle third of the toenail has been removed however the lateral third on both sides is still intact. Nail bed appears to be intact. There is no surrounding erythema, discharge. Full range of motion of the toe. Sensory intact to light touch, (remaining) cap refill less than 2 seconds.  Neurovascularly intact distally. Compartments soft above and below affected joint.   Neurological: She is alert. No sensory deficit.  Patient with able gait.  Skin: No pallor.  Psychiatric: She has a normal mood and affect.  Nursing note and vitals reviewed.    ED Treatments / Results  Labs (all labs ordered are listed, but only abnormal results are displayed) Labs Reviewed - No data to display  EKG  EKG Interpretation None       Radiology No results found.  Procedures Procedures (including critical care time)  Medications Ordered in ED Medications  Tdap (BOOSTRIX) injection 0.5 mL (0.5 mLs Intramuscular Given 06/18/17 1912)  ketorolac (TORADOL) 30 MG/ML injection 15 mg (15 mg Intramuscular Given 06/18/17 1915)     Initial Impression / Assessment and Plan / ED Course  I have reviewed the triage vital signs and the nursing notes.  Pertinent labs & imaging results that were available during my care of the patient were reviewed by me and considered in my medical decision making (see chart for details).     56 year old female presenting today after avulsion injury to left great toe. It appears that the medial third of the left great toe has been removed as a result of the injury, however the nailbed is intact as well as the lateral thirds of the nail. Patient is without signs of infection. She is afebrile on presentation and nontoxic appearing. The patient's wound was thoroughly irrigated with 500 MLS of normal saline. Bacitracin ointment and nonstick dressing placed over the area. Patient was updated on her tetanus vaccine. Her pain was controlled in the emergency department. The patient has an appointment with her podiatrist on Thursday at 10:30 AM she will follow-up for this. There is no signs of infection currently for which an antibiotic would be prescribed for. There is no evidence on exam for xray and the patient does not wish for one at this time. I advised the patient to  return to the emergency department with new or worsening symptoms or new concerns. Specific return precautions discussed including signs of infection. The patient verbalized understanding and agreement with plan. All questions answered. No further questions at this  time. Patient appears safe for discharge.  Patient case discussed with Dr. Sherry Ruffing who is in agreement with plan.   Final Clinical Impressions(s) / ED Diagnoses   Final diagnoses:  Avulsion of toenail, initial encounter    New Prescriptions Discharge Medication List as of 06/18/2017  7:28 PM    START taking these medications   Details  bacitracin ointment Apply 1 application topically 2 (two) times daily., Starting Tue 06/18/2017, Print         Jillyn Ledger, PA-C 06/18/17 1945    Gareth Morgan, MD 06/24/17 [phone]

## 2017-06-18 NOTE — ED Triage Notes (Addendum)
Pt from home with c/o left great toe pain following tripping and ripping her toe nail off this morning. Pt denies other injuries from fall. Pt states she called her podiatrist who could not see her for over a week. Bleeding controlled

## 2017-07-21 ENCOUNTER — Encounter (HOSPITAL_BASED_OUTPATIENT_CLINIC_OR_DEPARTMENT_OTHER): Payer: Self-pay | Admitting: Emergency Medicine

## 2017-07-21 ENCOUNTER — Emergency Department (HOSPITAL_BASED_OUTPATIENT_CLINIC_OR_DEPARTMENT_OTHER)
Admission: EM | Admit: 2017-07-21 | Discharge: 2017-07-21 | Disposition: A | Discharge status: Home / Self Care | Payer: Medicaid Other | Attending: Physician Assistant | Admitting: Physician Assistant

## 2017-07-21 DIAGNOSIS — Z5321 Procedure and treatment not carried out due to patient leaving prior to being seen by health care provider: Secondary | ICD-10-CM | POA: Insufficient documentation

## 2017-07-21 DIAGNOSIS — J029 Acute pharyngitis, unspecified: Secondary | ICD-10-CM | POA: Insufficient documentation

## 2017-07-21 LAB — RAPID STREP SCREEN (MED CTR MEBANE ONLY): Streptococcus, Group A Screen (Direct): NEGATIVE

## 2017-07-21 NOTE — ED Triage Notes (Signed)
Patient states that she is having swelling and "soreness" in her throat. She is not sure if she is getting sick or if it is something else. Ptient reports that she is also having pain to both her ears.

## 2017-07-21 NOTE — ED Notes (Addendum)
Report received from [facility]. Pt choosing to leave d/t wait. States, "I've been here for 2.5 hours and no one has looked at my ears". Care assumed at time pt leaving.   Alert, NAD, calm, interactive, resps e/u, speaking in clear complete sentences, no dyspnea noted, ambulatory with steady gait.

## 2017-07-23 LAB — CULTURE, GROUP A STREP (THRC)

## 2017-08-03 ENCOUNTER — Emergency Department (HOSPITAL_COMMUNITY): Payer: Medicaid Other

## 2017-08-03 ENCOUNTER — Encounter (HOSPITAL_COMMUNITY): Payer: Self-pay | Admitting: Emergency Medicine

## 2017-08-03 ENCOUNTER — Inpatient Hospital Stay (HOSPITAL_COMMUNITY)
Admission: EM | Admit: 2017-08-03 | Discharge: 2017-08-03 | DRG: 190 | Discharge status: Against Medical Advice | Payer: Medicaid Other | Attending: Family Medicine | Admitting: Family Medicine

## 2017-08-03 DIAGNOSIS — K219 Gastro-esophageal reflux disease without esophagitis: Secondary | ICD-10-CM | POA: Diagnosis present

## 2017-08-03 DIAGNOSIS — Z882 Allergy status to sulfonamides status: Secondary | ICD-10-CM | POA: Diagnosis not present

## 2017-08-03 DIAGNOSIS — Z881 Allergy status to other antibiotic agents status: Secondary | ICD-10-CM

## 2017-08-03 DIAGNOSIS — M1990 Unspecified osteoarthritis, unspecified site: Secondary | ICD-10-CM | POA: Diagnosis present

## 2017-08-03 DIAGNOSIS — Z8249 Family history of ischemic heart disease and other diseases of the circulatory system: Secondary | ICD-10-CM | POA: Diagnosis not present

## 2017-08-03 DIAGNOSIS — F419 Anxiety disorder, unspecified: Secondary | ICD-10-CM | POA: Diagnosis present

## 2017-08-03 DIAGNOSIS — G8929 Other chronic pain: Secondary | ICD-10-CM | POA: Diagnosis present

## 2017-08-03 DIAGNOSIS — M5416 Radiculopathy, lumbar region: Secondary | ICD-10-CM | POA: Diagnosis present

## 2017-08-03 DIAGNOSIS — J0100 Acute maxillary sinusitis, unspecified: Secondary | ICD-10-CM | POA: Diagnosis present

## 2017-08-03 DIAGNOSIS — J9601 Acute respiratory failure with hypoxia: Secondary | ICD-10-CM | POA: Diagnosis present

## 2017-08-03 DIAGNOSIS — M797 Fibromyalgia: Secondary | ICD-10-CM | POA: Diagnosis present

## 2017-08-03 DIAGNOSIS — K589 Irritable bowel syndrome without diarrhea: Secondary | ICD-10-CM | POA: Diagnosis present

## 2017-08-03 DIAGNOSIS — Z8261 Family history of arthritis: Secondary | ICD-10-CM | POA: Diagnosis not present

## 2017-08-03 DIAGNOSIS — Z801 Family history of malignant neoplasm of trachea, bronchus and lung: Secondary | ICD-10-CM

## 2017-08-03 DIAGNOSIS — R0602 Shortness of breath: Secondary | ICD-10-CM | POA: Diagnosis present

## 2017-08-03 DIAGNOSIS — Z7952 Long term (current) use of systemic steroids: Secondary | ICD-10-CM | POA: Diagnosis not present

## 2017-08-03 DIAGNOSIS — Z9049 Acquired absence of other specified parts of digestive tract: Secondary | ICD-10-CM | POA: Diagnosis not present

## 2017-08-03 DIAGNOSIS — F17200 Nicotine dependence, unspecified, uncomplicated: Secondary | ICD-10-CM | POA: Diagnosis not present

## 2017-08-03 DIAGNOSIS — Z825 Family history of asthma and other chronic lower respiratory diseases: Secondary | ICD-10-CM | POA: Diagnosis not present

## 2017-08-03 DIAGNOSIS — Z885 Allergy status to narcotic agent status: Secondary | ICD-10-CM

## 2017-08-03 DIAGNOSIS — J441 Chronic obstructive pulmonary disease with (acute) exacerbation: Secondary | ICD-10-CM | POA: Diagnosis present

## 2017-08-03 DIAGNOSIS — F17210 Nicotine dependence, cigarettes, uncomplicated: Secondary | ICD-10-CM | POA: Diagnosis present

## 2017-08-03 DIAGNOSIS — Z91013 Allergy to seafood: Secondary | ICD-10-CM | POA: Diagnosis not present

## 2017-08-03 DIAGNOSIS — F329 Major depressive disorder, single episode, unspecified: Secondary | ICD-10-CM | POA: Diagnosis present

## 2017-08-03 DIAGNOSIS — Z888 Allergy status to other drugs, medicaments and biological substances status: Secondary | ICD-10-CM

## 2017-08-03 LAB — BASIC METABOLIC PANEL
Anion gap: 7 (ref 5–15)
BUN: 9 mg/dL (ref 6–20)
CO2: 21 mmol/L — ABNORMAL LOW (ref 22–32)
Calcium: 9.1 mg/dL (ref 8.9–10.3)
Chloride: 111 mmol/L (ref 101–111)
Creatinine, Ser: 0.48 mg/dL (ref 0.44–1.00)
GFR calc Af Amer: >60 mL/min (ref >60)
GFR calc non Af Amer: >60 mL/min (ref >60)
Glucose, Bld: 137 mg/dL — ABNORMAL HIGH (ref 65–99)
Potassium: 3.6 mmol/L (ref 3.5–5.1)
Sodium: 139 mmol/L (ref 135–145)

## 2017-08-03 LAB — CBC WITH DIFFERENTIAL/PLATELET
Basophils Absolute: 0 10*3/uL (ref 0.0–0.1)
Basophils Relative: 0 %
Eosinophils Absolute: 0 10*3/uL (ref 0.0–0.7)
Eosinophils Relative: 0 %
HCT: 45.2 % (ref 36.0–46.0)
Hemoglobin: 15.1 g/dL — ABNORMAL HIGH (ref 12.0–15.0)
Lymphocytes Relative: 21 %
Lymphs Abs: 1.6 10*3/uL (ref 0.7–4.0)
MCH: 31.4 pg (ref 26.0–34.0)
MCHC: 33.4 g/dL (ref 30.0–36.0)
MCV: 94 fL (ref 78.0–100.0)
Monocytes Absolute: 0.7 10*3/uL (ref 0.1–1.0)
Monocytes Relative: 9 %
Neutro Abs: 5.2 10*3/uL (ref 1.7–7.7)
Neutrophils Relative %: 70 %
Platelets: 223 10*3/uL (ref 150–400)
RBC: 4.81 MIL/uL (ref 3.87–5.11)
RDW: 14.8 % (ref 11.5–15.5)
WBC: 7.5 10*3/uL (ref 4.0–10.5)

## 2017-08-03 LAB — BRAIN NATRIURETIC PEPTIDE: B Natriuretic Peptide: 21.4 pg/mL (ref 0.0–100.0)

## 2017-08-03 LAB — D-DIMER, QUANTITATIVE: D-Dimer, Quant: 0.3 ug/mL-FEU (ref 0.00–0.50)

## 2017-08-03 MED ORDER — DEXTROSE 5 % IV SOLN: 500.0000 mg | INTRAVENOUS | Status: DC

## 2017-08-03 MED ORDER — SIMETHICONE 80 MG PO CHEW: 80.0000 mg | CHEWABLE_TABLET | Freq: Four times a day (QID) | ORAL | Status: DC | PRN

## 2017-08-03 MED ORDER — SERTRALINE HCL 50 MG PO TABS
50.0000 mg | ORAL_TABLET | Freq: Every day | ORAL | Status: DC
Start: 2017-08-04 — End: 2017-08-04

## 2017-08-03 MED ORDER — LORAZEPAM 0.5 MG PO TABS: 0.5000 mg | ORAL_TABLET | Freq: Three times a day (TID) | ORAL | Status: DC | PRN

## 2017-08-03 MED ORDER — ONDANSETRON HCL 4 MG/2ML IJ SOLN: 4.0000 mg | Freq: Four times a day (QID) | INTRAMUSCULAR | Status: DC | PRN

## 2017-08-03 MED ORDER — PANTOPRAZOLE SODIUM 40 MG PO TBEC: 40.0000 mg | DELAYED_RELEASE_TABLET | Freq: Two times a day (BID) | ORAL | Status: DC

## 2017-08-03 MED ORDER — IBUPROFEN 200 MG PO TABS: 400.0000 mg | ORAL_TABLET | Freq: Four times a day (QID) | ORAL | Status: DC | PRN

## 2017-08-03 MED ORDER — SODIUM CHLORIDE 0.9% FLUSH: 3.0000 mL | Freq: Two times a day (BID) | INTRAVENOUS | Status: DC

## 2017-08-03 MED ORDER — HYDROCODONE-ACETAMINOPHEN 5-325 MG PO TABS: 1.0000 | ORAL_TABLET | ORAL | Status: DC | PRN

## 2017-08-03 MED ORDER — AZITHROMYCIN 250 MG PO TABS
500.0000 mg | ORAL_TABLET | Freq: Once | ORAL | Status: AC
  Administered 2017-08-03: 500 mg via ORAL
  Filled 2017-08-03: qty 2

## 2017-08-03 MED ORDER — SODIUM CHLORIDE 0.9 % IV BOLUS (SEPSIS)
500.0000 mL | Freq: Once | INTRAVENOUS | Status: AC
  Administered 2017-08-03: 500 mL via INTRAVENOUS

## 2017-08-03 MED ORDER — MOMETASONE FURO-FORMOTEROL FUM 100-5 MCG/ACT IN AERO
2.0000 | INHALATION_SPRAY | Freq: Two times a day (BID) | RESPIRATORY_TRACT | Status: DC
  Filled 2017-08-03: qty 8.8

## 2017-08-03 MED ORDER — SENNOSIDES-DOCUSATE SODIUM 8.6-50 MG PO TABS: 1.0000 | ORAL_TABLET | Freq: Every evening | ORAL | Status: DC | PRN

## 2017-08-03 MED ORDER — SODIUM CHLORIDE 0.9 % IV SOLN: INTRAVENOUS | Status: DC

## 2017-08-03 MED ORDER — IPRATROPIUM-ALBUTEROL 0.5-2.5 (3) MG/3ML IN SOLN
3.0000 mL | Freq: Once | RESPIRATORY_TRACT | Status: AC
  Administered 2017-08-03: 3 mL via RESPIRATORY_TRACT
  Filled 2017-08-03: qty 3

## 2017-08-03 MED ORDER — ALBUTEROL SULFATE (2.5 MG/3ML) 0.083% IN NEBU: 5.0000 mg | INHALATION_SOLUTION | Freq: Once | RESPIRATORY_TRACT | Status: DC

## 2017-08-03 MED ORDER — LORATADINE 10 MG PO TABS
10.0000 mg | ORAL_TABLET | Freq: Every day | ORAL | Status: DC
Start: 2017-08-04 — End: 2017-08-04

## 2017-08-03 MED ORDER — ACETAMINOPHEN 650 MG RE SUPP
650.0000 mg | Freq: Four times a day (QID) | RECTAL | Status: DC | PRN
Start: 2017-08-03 — End: 2017-08-04

## 2017-08-03 MED ORDER — NICOTINE 7 MG/24HR TD PT24: 7.0000 mg | MEDICATED_PATCH | Freq: Every day | TRANSDERMAL | Status: DC

## 2017-08-03 MED ORDER — ENOXAPARIN SODIUM 40 MG/0.4ML ~~LOC~~ SOLN: 40.0000 mg | Freq: Every day | SUBCUTANEOUS | Status: DC

## 2017-08-03 MED ORDER — ONDANSETRON HCL 4 MG PO TABS
4.0000 mg | ORAL_TABLET | Freq: Four times a day (QID) | ORAL | Status: DC | PRN
Start: 2017-08-03 — End: 2017-08-04

## 2017-08-03 MED ORDER — ALBUTEROL SULFATE (2.5 MG/3ML) 0.083% IN NEBU: 2.5000 mg | INHALATION_SOLUTION | RESPIRATORY_TRACT | Status: DC | PRN

## 2017-08-03 MED ORDER — METHYLPREDNISOLONE SODIUM SUCC 125 MG IJ SOLR: 60.0000 mg | Freq: Three times a day (TID) | INTRAMUSCULAR | Status: DC

## 2017-08-03 MED ORDER — BISACODYL 5 MG PO TBEC: 5.0000 mg | DELAYED_RELEASE_TABLET | Freq: Every day | ORAL | Status: DC | PRN

## 2017-08-03 MED ORDER — DEXAMETHASONE SODIUM PHOSPHATE 10 MG/ML IJ SOLN
10.0000 mg | Freq: Once | INTRAMUSCULAR | Status: AC
  Administered 2017-08-03: 10 mg via INTRAVENOUS
  Filled 2017-08-03: qty 1

## 2017-08-03 MED ORDER — IPRATROPIUM-ALBUTEROL 0.5-2.5 (3) MG/3ML IN SOLN: 3.0000 mL | Freq: Three times a day (TID) | RESPIRATORY_TRACT | Status: DC

## 2017-08-03 MED ORDER — OXYMETAZOLINE HCL 0.05 % NA SOLN
1.0000 | Freq: Two times a day (BID) | NASAL | Status: DC | PRN
  Filled 2017-08-03: qty 15

## 2017-08-03 MED ORDER — ACETAMINOPHEN 325 MG PO TABS: 650.0000 mg | ORAL_TABLET | Freq: Four times a day (QID) | ORAL | Status: DC | PRN

## 2017-08-03 NOTE — H&P (Signed)
History and Physical    Sheri Rivera WPY:[id number] DOB: 1961-10-06 DOA: 08/03/2017  PCP: Vonna Drafts, FNP   Patient coming from: Home  Chief Complaint: SOB, non-productive cough  HPI: Sheri Rivera is a 56 y.o. female with medical history significant for anxiety, tobacco abuse, and COPD, now presenting to the emergency department for evaluation of shortness of breath, wheezing, lightheadedness, cough, and pain. Patient reports that she had been exposed to a relative with upper respiratory symptoms, and has had been suffering from rhinorrhea, sinus congestion, mild frontal headache, and sore throat for the past several days. The symptoms were improving slightly when she developed shortness of breath today that is worsened over the course of the day despite her use of albuterol nebs at home. She also reports a sharp pain in her upper right back, worse with certain movements or with a inspiration or cough. Denies fevers or chills. Denies chest pain or palpitations. Reports chronic bilateral lower extremity edema that is unchanged. No orthopnea. Had been using Advair until she ran out, and has been unable to follow up with her pulmonologist for refill.   ED Course: Upon arrival to the ED, patient is found to be afebrile, has saturating adequately on room air while at rest, but dropping into the low 80s with minimal exertion. She is mildly tachypneic, tachycardic to 1:30, and was stable blood pressure. EKG features a sinus tachycardia with rate 130 and chest x-ray is notable for basilar opacities, likely representing atelectasis or mild edema. Chemistry panel is notable for a bicarbonate of 21 and CBC features a hemoglobin of 15.1. Patient was treated with 10 mg IV Decadron, azithromycin, and duo nebs in the ED. She continued to desaturate with minimal exertion and will be admitted to the telemetry unit for ongoing evaluation and management of acute hypoxic respiratory failure, suspected  secondary to exacerbation and COPD, likely precipitated by a viral URI.  Review of Systems:  All other systems reviewed and apart from HPI, are negative.  Past Medical History:  Diagnosis Date  . Anxiety 11/11/11  . Arthritis   . Asthma   . Bursitis    right shoulder  . Chronic back pain   . Chronic neck pain   . Chronic shoulder pain   . COPD (chronic obstructive pulmonary disease) ([place])   . DDD (degenerative disc disease)   . Depression   . Fibromyalgia   . GERD (gastroesophageal reflux disease)   . Heart murmur    as a child per pt  . Herpes   . IBS (irritable bowel syndrome)   . Lumbar radiculopathy   . Seasonal allergies   . Thyroid nodule     Past Surgical History:  Procedure Laterality Date  . CHOLECYSTECTOMY    . HERNIA REPAIR     [place]   . HIATAL HERNIA REPAIR    . TUBAL LIGATION  1983     reports that she has been smoking Cigarettes.  She has a 6.75 pack-year smoking history. She has never used smokeless tobacco. She reports that she does not drink alcohol or use drugs.  Allergies  Allergen Reactions  . Cymbalta [Duloxetine Hcl] Anaphylaxis and Itching    Lips and throat swelled; stopped breathing  . Levaquin [Levofloxacin In D5w] Anaphylaxis and Itching  . Lyrica [Pregabalin] Shortness Of Breath and Swelling    Lips and throat swelled  . 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 Sulfamethoxazole Anaphylaxis  . Codeine Anxiety  Sweating,nervous    Family History  Problem Relation Age of Onset  . Lung cancer Mother   . Arthritis Mother        Rheumatoid  . Bursitis Mother   . Asthma Mother   . Heart attack Father 67       Died of MI  . Obesity Father   . Fibromyalgia Sister   . Asthma Child      Prior to Admission medications   Medication Sig Start Date End Date Taking? Authorizing Provider  albuterol (PROVENTIL HFA;VENTOLIN HFA) 108 (90 BASE) MCG/ACT inhaler Inhale 2 puffs into the lungs every 6 (six) hours as needed for  shortness of breath. Shortness of breath   Yes [provider]  albuterol (PROVENTIL) (2.5 MG/3ML) 0.083% nebulizer solution Take 3 mLs (2.5 mg total) by nebulization every 4 (four) hours as needed for wheezing or shortness of breath. Shortness of breath 08/02/16  Yes Molpus, John, MD  loratadine (CLARITIN) 10 MG tablet Take 10 mg by mouth daily.   Yes [provider]  LORazepam (ATIVAN) 1 MG tablet Take 0.5 mg by mouth every 8 (eight) hours as needed for anxiety.  03/16/11  Yes Simpson, Modena Nunnery, MD  pantoprazole (PROTONIX) 20 MG tablet Take 2 tablets (40 mg total) by mouth 2 (two) times daily. 04/07/13  Yes Zehr, Laban Emperor, PA-C  sertraline (ZOLOFT) 50 MG tablet Take 50 mg by mouth daily.   Yes [provider]  simethicone (GAS-X) 80 MG chewable tablet Chew 80 mg by mouth every 6 (six) hours as needed for flatulence.   Yes [provider]    Physical Exam: Vitals:   08/03/17 1641 08/03/17 1705 08/03/17 1745  BP: 116/85  119/70  Pulse: (!) 132 (!) 116 (!) 107  Resp: (!) 22 18 (!) 22  Temp: 97.8 F (36.6 C)    TempSrc: Oral    SpO2: 96% 98% 94%      Constitutional: NAD, calm, appears uncomfortable Eyes: PERTLA, lids and conjunctivae normal ENMT: Mucous membranes are moist. Posterior pharynx clear of any exudate or lesions.   Neck: normal, supple, no masses, no thyromegaly Respiratory: Mild tachypnea, dyspnea with speech, cough, slightly diminished bilaterally with expiratory wheezes. Increased WOB. No pallor or cyanosis.   Cardiovascular: Rate ~120 and regular. 1+ pretibial pitting edema bilaterally. No carotid bruits.  Abdomen: No distension, no tenderness, no masses palpated. Bowel sounds normal.  Musculoskeletal: no clubbing / cyanosis. No joint deformity upper and lower extremities.  Skin: no significant rashes, lesions, ulcers. Warm, dry, well-perfused. Neurologic: CN 2-12 grossly intact. Sensation intact. Strength 5/5 in all 4 limbs.    Psychiatric: Alert and oriented x 3. Anxious, frustrated.     Labs on Admission: I have personally reviewed following labs and imaging studies  CBC:  Recent Labs Lab 08/03/17 1806  WBC 7.5  NEUTROABS 5.2  HGB 15.1*  HCT 45.2  MCV 94.0  PLT 818   Basic Metabolic Panel:  Recent Labs Lab 08/03/17 1806  NA 139  K 3.6  CL 111  CO2 21*  GLUCOSE 137*  BUN 9  CREATININE 0.48  CALCIUM 9.1   GFR: Estimated Creatinine Clearance: 81.1 mL/min (by C-G formula based on SCr of 0.48 mg/dL). Liver Function Tests: No results for input(s): AST, ALT, ALKPHOS, BILITOT, PROT, ALBUMIN in the last 168 hours. No results for input(s): LIPASE, AMYLASE in the last 168 hours. No results for input(s): AMMONIA in the last 168 hours. Coagulation Profile: No results for input(s): INR, PROTIME  in the last 168 hours. Cardiac Enzymes: No results for input(s): CKTOTAL, CKMB, CKMBINDEX, TROPONINI in the last 168 hours. BNP (last 3 results) No results for input(s): PROBNP in the last 8760 hours. HbA1C: No results for input(s): HGBA1C in the last 72 hours. CBG: No results for input(s): GLUCAP in the last 168 hours. Lipid Profile: No results for input(s): CHOL, HDL, LDLCALC, TRIG, CHOLHDL, LDLDIRECT in the last 72 hours. Thyroid Function Tests: No results for input(s): TSH, T4TOTAL, FREET4, T3FREE, THYROIDAB in the last 72 hours. Anemia Panel: No results for input(s): VITAMINB12, FOLATE, FERRITIN, TIBC, IRON, RETICCTPCT in the last 72 hours. Urine analysis:    Component Value Date/Time   COLORURINE YELLOW 06/20/2016 1650   APPEARANCEUR CLEAR 06/20/2016 1650   LABSPEC 1.008 06/20/2016 1650   PHURINE 7.0 06/20/2016 1650   GLUCOSEU NEGATIVE 06/20/2016 1650   HGBUR NEGATIVE 06/20/2016 1650   BILIRUBINUR NEGATIVE 06/20/2016 1650   KETONESUR NEGATIVE 06/20/2016 1650   PROTEINUR NEGATIVE 06/20/2016 1650   UROBILINOGEN 0.2 10/08/2015 0059   NITRITE NEGATIVE 06/20/2016 1650   LEUKOCYTESUR TRACE  (A) 06/20/2016 1650   Sepsis Labs: @LABRCNTIP (procalcitonin:4,lacticidven:4) )No results found for this or any previous visit (from the past 240 hour(s)).   Radiological Exams on Admission: Dg Chest 2 View  Result Date: 08/03/2017 CLINICAL DATA:  Patient with sore throat.  Shortness of breath. EXAM: CHEST  2 VIEW COMPARISON:  Chest radiograph 08/01/2016 FINDINGS: Monitoring leads overlie the patient. Normal cardiac and mediastinal contours. Bibasilar heterogeneous pulmonary opacities. Trace bilateral pleural effusions. Thoracic spine degenerative changes. Gaseous distention of the stomach. IMPRESSION: Basilar heterogeneous opacities may represent atelectasis or mild edema. Electronically Signed   By: Lovey Newcomer M.D.   On: 08/03/2017 18:06    EKG: Independently reviewed. Sinus tachycardia (rate 130).   Assessment/Plan  1. COPD with acute exacerbation, acute hypoxic respiratory failure  - Presents with dyspnea and non-productive cough after several days of URI sxs  - CXR with suspected atelectasis in bases, no fever or leukocytosis  - She saturates low 80's on rm air with mild exertion in ED  - Given the recent sick contact with URI sxs, and now dyspnea, cough, and wheezing, suspect this represents an acute exacerbation in COPD, precipitated by likely viral URI  - She was treated in ED with supplemental O2, systemic steroid, DuoNeb, and azithromycin  - She was previously on Advair prior to running out; plan to resume ICS/LABA, check sputum culture and continue steroids and abx, continue DuoNeb, prn albuterol nebs, prn supplemental O2  - She reports some pleuritic pain and is persistently tachycardic, low-probability for PE, will check d-dimer  2. Anxiety  - Patient appears anxious on admission, reports worrying about her pets at home - Continue Zoloft and prn Ativan   3. Current smoker  - Counseled toward cessation    4. GERD - Stable, no EGD report on file  - Managed at home with BID  Protonix, will continue    DVT prophylaxis: sq Lovenox Code Status: Full  Family Communication: Discussed with patient Disposition Plan: Admit to telemetry Consults called: None Admission status: Inpatient    Vianne Bulls, MD [facility] Pager [phone]  If 7PM-7AM, please contact night-coverage [email or web address] Password [email or web address]  08/03/2017, 8:11 PM

## 2017-08-03 NOTE — ED Notes (Signed)
Respiratory informed of breathing treatment, on their way.

## 2017-08-03 NOTE — ED Provider Notes (Signed)
[facility] DEPT Provider Note   CSN: [id number] Arrival date & time: 08/03/17  1635     History   Chief Complaint Chief Complaint  Patient presents with  . Shortness of Breath    HPI Sheri Rivera is a 56 y.o. female presenting with shortness of breath and chest tightness.  Patient states that the past several days, she's had a head cold, including nasal congestion, bilateral ear pain, frontal pressure, sore throat, and cough. Today she developed worsening shortness of breath and chest tightness. She used her at-home albuterol and nebulizers without relief of symptoms. She reports right lower lateral chest pain, worse with coughing. Her cough is nonproductive. Her son has similar symptoms. She denies fever, chills, nausea, vomiting, abdominal pain, urinary symptoms, or abnormal bowel movements. She has a past medical history significant for COPD, for which she was taking Advair one point, but has not had the ability to follow-up with pulmonology, and is no longer taking a preventative daily medicine.   Per RN, when pt is without O2, sats are in the upper 80s. Pt does not wear O2 at home.   HPI  Past Medical History:  Diagnosis Date  . Anxiety 11/11/11  . Arthritis   . Asthma   . Bursitis    right shoulder  . Chronic back pain   . Chronic neck pain   . Chronic shoulder pain   . COPD (chronic obstructive pulmonary disease) ([place])   . DDD (degenerative disc disease)   . Depression   . Fibromyalgia   . GERD (gastroesophageal reflux disease)   . Heart murmur    as a child per pt  . Herpes   . IBS (irritable bowel syndrome)   . Lumbar radiculopathy   . Seasonal allergies   . Thyroid nodule     Patient Active Problem List   Diagnosis Date Noted  . Acid reflux 06/13/2016  . Chronic obstructive pulmonary disease ([place]) 06/13/2016  . Current tobacco use 09/05/2015  . History of fracture of vertebra 02/16/2015  . Diffuse pain 04/03/2014  . Hemorrhage, postmenopausal  12/29/2013  . Chronic obstructive pulmonary disease with acute exacerbation ([place]) 10/30/2013  . COPD mixed type ([place]) 03/30/2013  . Abdominal pain, epigastric 02/25/2013  . Dysphagia, unspecified(787.20) 02/25/2013  . Gas 02/25/2013  . Abdominal bloating 02/25/2013  . Precordial pain 02/16/2013  . Multinodular goiter 10/05/2012  . 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 Bladder cystocele 07/21/2012  . Clinical depression 11/11/2011  . Allergic rhinitis 11/11/2011  . URI (upper respiratory infection) 09/28/2011  . Sore throat 07/18/2011  . Neck swelling 06/22/2011  . Ingrown right big toenail 05/23/2011  . Back pain 03/16/2011  . 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/05/2010  . Shortness of breath 03/15/2010  . GOITER, MULTINODULAR 07/01/2009  . FIBROMYALGIA 07/01/2009  . ALLERGIC RHINITIS CAUSE UNSPECIFIED 03/02/2008  . ANXIETY 01/23/2007  . TOBACCO DEPENDENCE 01/23/2007  . GASTROESOPHAGEAL REFLUX, NO ESOPHAGITIS 01/23/2007    Past Surgical History:  Procedure Laterality Date  . CHOLECYSTECTOMY    . HERNIA REPAIR     [place]   . HIATAL HERNIA REPAIR    . TUBAL LIGATION  1983    OB History    No data available       Home Medications    Prior to Admission medications   Medication Sig Start Date End Date Taking? Authorizing Provider  albuterol (PROVENTIL HFA;VENTOLIN HFA) 108 (90 BASE) MCG/ACT inhaler Inhale 2 puffs into the lungs every 6 (six) hours as needed for  shortness of breath. Shortness of breath    [provider]  albuterol (PROVENTIL) (2.5 MG/3ML) 0.083% nebulizer solution Take 3 mLs (2.5 mg total) by nebulization every 4 (four) hours as needed for wheezing or shortness of breath. Shortness of breath 08/02/16   Molpus, John, MD  bacitracin ointment Apply 1 application topically 2 (two) times daily. 06/18/17   Maczis, Barth Kirks, PA-C  cyclobenzaprine (FLEXERIL) 10 MG tablet Take 1 tablet (10 mg total) by mouth at bedtime. 04/16/17   Lawyer, Harrell Gave, PA-C    diclofenac sodium (VOLTAREN) 1 % GEL Apply 2 g topically 3 (three) times daily as needed. 02/19/17   Mackuen, Courteney Lyn, MD  doxycycline (VIBRAMYCIN) 100 MG capsule Take 1 capsule (100 mg total) by mouth 2 (two) times daily. 08/02/16   Hedges, Dellis Filbert, PA-C  HYDROcodone-acetaminophen (NORCO/VICODIN) 5-325 MG tablet Take 1-2 tablets by mouth every 4 (four) hours as needed. 06/20/16   Malvin Johns, MD  HYDROcodone-acetaminophen (NORCO/VICODIN) 5-325 MG tablet Take 1 tablet by mouth every 4 (four) hours as needed. 02/19/17   Mackuen, Courteney Lyn, MD  HYDROcodone-ibuprofen (VICOPROFEN) 7.5-200 MG tablet Take 1 tablet by mouth every 6 (six) hours as needed for moderate pain. 09/16/16   Gloriann Loan, PA-C  ibuprofen (ADVIL,MOTRIN) 800 MG tablet Take 1 tablet (800 mg total) by mouth 3 (three) times daily. 03/14/16   Nona Dell, PA-C  loratadine (CLARITIN) 10 MG tablet Take 10 mg by mouth daily.    [provider]  LORazepam (ATIVAN) 1 MG tablet Take 0.5 mg by mouth every 8 (eight) hours as needed for anxiety.  03/16/11   Alycia Rossetti, MD  pantoprazole (PROTONIX) 20 MG tablet Take 2 tablets (40 mg total) by mouth 2 (two) times daily. Patient taking differently: Take 20 mg by mouth 2 (two) times daily.  04/07/13   Zehr, Laban Emperor, PA-C  predniSONE (DELTASONE) 50 MG tablet Take 1 tablet (50 mg total) by mouth daily. 04/16/17   Lawyer, Harrell Gave, PA-C  sertraline (ZOLOFT) 50 MG tablet Take 50 mg by mouth daily.    [provider]  simethicone (GAS-X) 80 MG chewable tablet Chew 80 mg by mouth every 6 (six) hours as needed for flatulence.    [provider]  traMADol (ULTRAM) 50 MG tablet Take 1 tablet (50 mg total) by mouth every 6 (six) hours as needed for severe pain. 04/16/17   Dalia Heading, PA-C    Family History Family History  Problem Relation Age of Onset  . Lung cancer Mother   . Arthritis Mother        Rheumatoid  . Bursitis Mother   . Asthma  Mother   . Heart attack Father 81       Died of MI  . Obesity Father   . Fibromyalgia Sister   . Asthma Child     Social History Social History  Substance Use Topics  . Smoking status: Current Every Day Smoker    Packs/day: 0.25    Years: 27.00    Types: Cigarettes  . Smokeless tobacco: Never Used  . Alcohol use No     Allergies   Cymbalta [duloxetine hcl]; Levaquin [levofloxacin in d5w]; Lyrica [pregabalin]; Shellfish allergy; Sulfamethoxazole; and Codeine   Review of Systems Review of Systems  Constitutional: Negative for chills and fever.  HENT: Positive for congestion, ear pain, rhinorrhea, sinus pain, sinus pressure and sore throat.   Eyes: Negative for pain, discharge and itching.  Respiratory: Positive for cough, chest tightness and shortness  of breath.   Cardiovascular: Positive for chest pain. Negative for palpitations and leg swelling.  Gastrointestinal: Negative for abdominal pain, constipation, diarrhea, nausea and vomiting.  Genitourinary: Negative for dysuria, frequency and hematuria.  Musculoskeletal: Negative for back pain and neck pain.  Skin: Negative for wound.  Allergic/Immunologic: Negative for immunocompromised state.  Neurological: Negative for dizziness and light-headedness.  Hematological: Does not bruise/bleed easily.     Physical Exam Updated Vital Signs BP 116/85 (BP Location: Right Arm)   Pulse (!) 116   Temp 97.8 F (36.6 C) (Oral)   Resp 18   SpO2 98%   Physical Exam  Constitutional: She is oriented to person, place, and time. She appears well-developed and well-nourished. No distress.  HENT:  Head: Normocephalic and atraumatic.  Right Ear: External ear and ear canal normal.  Left Ear: External ear and ear canal normal.  Nose: Mucosal edema and rhinorrhea present. Right sinus exhibits maxillary sinus tenderness. Right sinus exhibits no frontal sinus tenderness. Left sinus exhibits maxillary sinus tenderness. Left sinus exhibits no  frontal sinus tenderness.  Mouth/Throat: Uvula is midline, oropharynx is clear and moist and mucous membranes are normal.  Bilateral TMs retracted. No obvious purulent fluid.   Eyes: Pupils are equal, round, and reactive to light. Conjunctivae and EOM are normal.  Neck: Normal range of motion.  Cardiovascular: Regular rhythm and intact distal pulses.  Tachycardia present.   Pulmonary/Chest: Effort normal. No respiratory distress. She has no wheezes. She has no rhonchi. She has no rales. She exhibits no tenderness.  Decreased air movement, especially in upper lobes bilaterally. No obvious wheezing or other adventitious sounds.  Abdominal: Soft. Bowel sounds are normal. She exhibits no distension and no mass. There is no tenderness. There is no guarding.  Musculoskeletal: Normal range of motion.  Lymphadenopathy:    She has no cervical adenopathy.  Neurological: She is alert and oriented to person, place, and time.  Skin: Skin is warm and dry.  Psychiatric: She has a normal mood and affect.  Nursing note and vitals reviewed.    ED Treatments / Results  Labs (all labs ordered are listed, but only abnormal results are displayed) Labs Reviewed  CBC WITH DIFFERENTIAL/PLATELET - Abnormal; Notable for the following:       Result Value   Hemoglobin 15.1 (*)    All other components within normal limits  BASIC METABOLIC PANEL - Abnormal; Notable for the following:    CO2 21 (*)    Glucose, Bld 137 (*)    All other components within normal limits  CULTURE, EXPECTORATED SPUTUM-ASSESSMENT  BRAIN NATRIURETIC PEPTIDE  HIV ANTIBODY (ROUTINE TESTING)  BASIC METABOLIC PANEL  CBC  D-DIMER, QUANTITATIVE (NOT AT [facility])    EKG  EKG Interpretation  Date/Time:  Saturday August 03 2017 16:46:28 EDT Ventricular Rate:  130 PR Interval:    QRS Duration: 98 QT Interval:  331 QTC Calculation: 487 R Axis:   75 Text Interpretation:  Age not entered, assumed to be  56 years old for purpose of ECG  interpretation Sinus tachycardia Borderline prolonged QT interval No STEMI. Similar to prior.  Confirmed by Nanda Quinton [phone]) on 08/03/2017 5:22:33 PM       Radiology Dg Chest 2 View  Result Date: 08/03/2017 CLINICAL DATA:  Patient with sore throat.  Shortness of breath. EXAM: CHEST  2 VIEW COMPARISON:  Chest radiograph 08/01/2016 FINDINGS: Monitoring leads overlie the patient. Normal cardiac and mediastinal contours. Bibasilar heterogeneous pulmonary opacities. Trace bilateral pleural effusions. Thoracic spine degenerative  changes. Gaseous distention of the stomach. IMPRESSION: Basilar heterogeneous opacities may represent atelectasis or mild edema. Electronically Signed   By: Lovey Newcomer M.D.   On: 08/03/2017 18:06    Procedures Procedures (including critical care time)  Medications Ordered in ED Medications  albuterol (PROVENTIL) (2.5 MG/3ML) 0.083% nebulizer solution 2.5 mg (not administered)  loratadine (CLARITIN) tablet 10 mg (not administered)  sertraline (ZOLOFT) tablet 50 mg (not administered)  simethicone (MYLICON) chewable tablet 80 mg (not administered)  pantoprazole (PROTONIX) EC tablet 40 mg (40 mg Oral Not Given 08/03/17 2200)  LORazepam (ATIVAN) tablet 0.5 mg (not administered)  enoxaparin (LOVENOX) injection 40 mg (40 mg Subcutaneous Not Given 08/03/17 2200)  sodium chloride flush (NS) 0.9 % injection 3 mL (not administered)  0.9 %  sodium chloride infusion (not administered)  acetaminophen (TYLENOL) tablet 650 mg (not administered)    Or  acetaminophen (TYLENOL) suppository 650 mg (not administered)  HYDROcodone-acetaminophen (NORCO/VICODIN) 5-325 MG per tablet 1-2 tablet (not administered)  senna-docusate (Senokot-S) tablet 1 tablet (not administered)  bisacodyl (DULCOLAX) EC tablet 5 mg (not administered)  ondansetron (ZOFRAN) tablet 4 mg (not administered)    Or  ondansetron (ZOFRAN) injection 4 mg (not administered)  methylPREDNISolone sodium succinate  (SOLU-MEDROL) 125 mg/2 mL injection 60 mg (not administered)  azithromycin (ZITHROMAX) 500 mg in dextrose 5 % 250 mL IVPB (not administered)  ipratropium-albuterol (DUONEB) 0.5-2.5 (3) MG/3ML nebulizer solution 3 mL (3 mLs Nebulization Not Given 08/03/17 2246)  mometasone-formoterol (DULERA) 100-5 MCG/ACT inhaler 2 puff (2 puffs Inhalation Not Given 08/03/17 2247)  ibuprofen (ADVIL,MOTRIN) tablet 400-600 mg (not administered)  nicotine (NICODERM CQ - dosed in mg/24 hr) patch 7 mg (not administered)  oxymetazoline (AFRIN) 0.05 % nasal spray 1-2 spray (not administered)  ipratropium-albuterol (DUONEB) 0.5-2.5 (3) MG/3ML nebulizer solution 3 mL (3 mLs Nebulization Given 08/03/17 1705)  dexamethasone (DECADRON) injection 10 mg (10 mg Intravenous Given 08/03/17 1815)  ipratropium-albuterol (DUONEB) 0.5-2.5 (3) MG/3ML nebulizer solution 3 mL (3 mLs Nebulization Given 08/03/17 1959)  azithromycin (ZITHROMAX) tablet 500 mg (500 mg Oral Given 08/03/17 1959)  sodium chloride 0.9 % bolus 500 mL (500 mLs Intravenous New Bag/Given 08/03/17 2025)     Initial Impression / Assessment and Plan / ED Course  I have reviewed the triage vital signs and the nursing notes.  Pertinent labs & imaging results that were available during my care of the patient were reviewed by me and considered in my medical decision making (see chart for details).     Patient presenting with URI symptoms and worsening shortness of breath and chest tightness today. History of COPD. Upon arrival, sats in the upper 85s. Patient maintaining well with nasal cannula. Physical exam shows decreased air movement in all lung fields. Will give breathing treatment and Decadron. Will order basic labs, EKG, chest x-ray.  Labs and EKG reassuring. CXR shows ?mild atelectasis vs mild edema. On reassessment after breathing treatment, patient has improved air movement in all lung fields. Will ambulate with pulse ox for eval  With ambulation, patient's sats dropped  to 82%. Discussed case with attending, Dr. Laverta Baltimore evaluated the patient. Will contact hospitalist for admission.  Pt states that she wants to try another breathing treatment and ambulation again, as her dogs are at home, and she needs to take care of them. Discussed concerns about her oxygen saturations dropping, and how that could be potentially fatal if this happens at home. Will repeat breathing tx and ambulate again.   Pt's sats dropped to 83% with  ambulation. Discussed importance of staying at the hospital. Pt states she understands and agrees to admission.   Pt to be admitted.  Final Clinical Impressions(s) / ED Diagnoses   Final diagnoses:  COPD exacerbation ([place])  Acute maxillary sinusitis, recurrence not specified    New Prescriptions New Prescriptions   No medications on file     Franchot Heidelberg, PA-C 08/03/17 2315    Margette Fast, MD 08/04/17 1217

## 2017-08-03 NOTE — Progress Notes (Signed)
Entered room to give patient treatment and she stated that it was to close to the last treatment time that she had one.  She asked that she have an inhaler at bedside at I explained that the inhaler has not up from pharmacy and it was not a rescue inhaler.  Pt. Stated that she was "pissed" that no one would tell her why she needed oxygen and that her oxygen levels were dropping and she didn't understand.  I advised that she would need to speak with the MD that I was not sure what was different this time verses the other times that she had infections.  Pt. Then started ripping off her EKG leads and screaming that she was leaving and never wanted to be here.  I asked her what I could do to make her happy and to stay for treatment and she said I'm leaving AMA and you can't stop me.  I requested that the RN speak with the patient.

## 2017-08-03 NOTE — Progress Notes (Signed)
Patient admitted to room 1402. Did not allow staff to complete a skin assessment. Demanded ibuprofen and a nicotine patch upon arrival, which the MD did order after RN paged him. Pt expressed concerns about her son driving her "new car" home and stated she would be leaving "first thing in the morning whether you like it or not." While RN left her room to get O2 extension tubing and her medications, pt began yelling at the respiratory therapist and pulling off all of her lines and tubing. Pt stated she was going to "Get the fuck out of here" because "I can do my own nebulizer treatments at home. I'll go to the doctor tomorrow and get an antibiotic." PIV was removed and pt did sign AMA form while stating, "I'll take my care into my own hands. You don't have to read me this whole form, I've seen it plenty of times." MD Opyd was notified along with [facility]. Pt refused a W/C with assist and ambulated out of hospital independently.

## 2017-08-03 NOTE — ED Notes (Signed)
Please call RN Marolyn Hammock with report at [phone], [phone].

## 2017-08-03 NOTE — ED Triage Notes (Addendum)
Pt reports she has had nasal stuffiness and sore throat for the past few days. Began to have SOB today accompanied by lightheadedness. Also has non-productive cough. Pt has a hx of COPD and smokes.  Had to have 2L [place] applied in triage.

## 2017-08-03 NOTE — ED Notes (Signed)
Pt ambulate in the hallway without oxygen. Pt desat to 82% on RA.

## 2017-08-05 NOTE — Discharge Summary (Signed)
Physician Discharge Summary  Sheri Rivera:[id number] DOB: 1961-03-02 DOA: 08/03/2017  PCP: Vonna Drafts, FNP  Admit date: 08/03/2017 Discharge date: 08/05/2017  Admitted From: Home Disposition:  Home, AMA  Recommendations for Outpatient Follow-up:  1. Follow up with PCP in 1-2 weeks 2. Please obtain BMP/CBC in one week 3. Please follow up on the following pending results:   Discharge Condition: Guarded CODE STATUS: Full  Diet recommendation: Heart healthy  Brief/Interim Summary: Sheri Rivera is a 56 y.o. female with medical history significant for anxiety, tobacco abuse, and COPD, now presenting to the emergency department for evaluation of shortness of breath, wheezing, lightheadedness, cough, and pain. Patient reports that she had been exposed to a relative with upper respiratory symptoms, and has had been suffering from rhinorrhea, sinus congestion, mild frontal headache, and sore throat for the past several days. The symptoms were improving slightly when she developed shortness of breath today that is worsened over the course of the day despite her use of albuterol nebs at home. She also reports a sharp pain in her upper right back, worse with certain movements or with a inspiration or cough. Denies fevers or chills. Denies chest pain or palpitations. Reports chronic bilateral lower extremity edema that is unchanged. No orthopnea. Had been using Advair until she ran out, and has been unable to follow up with her pulmonologist for refill.   Upon arrival to the ED, patient is found to be afebrile, has saturating adequately on room air while at rest, but dropping into the low 80s with minimal exertion. She is mildly tachypneic, tachycardic to 1:30, and was stable blood pressure. EKG features a sinus tachycardia with rate 130 and chest x-ray is notable for basilar opacities, likely representing atelectasis or mild edema. Chemistry panel is notable for a bicarbonate of 21 and CBC  features a hemoglobin of 15.1. Patient was treated with 10 mg IV Decadron, azithromycin, and duo nebs in the ED. She continued to desaturate with minimal exertion and will be admitted to the telemetry unit for ongoing evaluation and management of acute hypoxic respiratory failure, suspected secondary to exacerbation and COPD, likely precipitated by a viral URI.  Soon after reaching the wards, patient became verbal abusive with staff, removed her IV and monitoring devices, reported that she was leaving, was advised to stay for treatment, was warned of potential death if her condition goes untreated and unmonitored, and she signed AMA papers and left.   Discharge Diagnoses:  Principal Problem:   Chronic obstructive pulmonary disease with acute exacerbation (HCC) Active Problems:   Anxiety   TOBACCO DEPENDENCE   GERD (gastroesophageal reflux disease)   Acute respiratory failure with hypoxia (HCC)    Discharge Instructions   Allergies as of 08/03/2017      Reactions   Cymbalta [duloxetine Hcl] Anaphylaxis, Itching   Lips and throat swelled; stopped breathing   Levaquin [levofloxacin In D5w] Anaphylaxis, Itching   Lyrica [pregabalin] Shortness Of Breath, Swelling   Lips and throat swelled   Shellfish Allergy Anaphylaxis   Has EPI-PEN   Sulfamethoxazole Anaphylaxis   Codeine Anxiety   Sweating,nervous      Medication List    ASK your doctor about these medications   albuterol (2.5 MG/3ML) 0.083% nebulizer solution Commonly known as:  PROVENTIL Take 3 mLs (2.5 mg total) by nebulization every 4 (four) hours as needed for wheezing or shortness of breath. Shortness of breath   albuterol 108 (90 Base) MCG/ACT inhaler Commonly known as:  PROVENTIL HFA;VENTOLIN  HFA Inhale 2 puffs into the lungs every 6 (six) hours as needed for shortness of breath. Shortness of breath   GAS-X 80 MG chewable tablet Generic drug:  simethicone Chew 80 mg by mouth every 6 (six) hours as needed for  flatulence.   loratadine 10 MG tablet Commonly known as:  CLARITIN Take 10 mg by mouth daily.   LORazepam 1 MG tablet Commonly known as:  ATIVAN Take 0.5 mg by mouth every 8 (eight) hours as needed for anxiety.   pantoprazole 20 MG tablet Commonly known as:  PROTONIX Take 2 tablets (40 mg total) by mouth 2 (two) times daily.   sertraline 50 MG tablet Commonly known as:  ZOLOFT Take 50 mg by mouth daily.       Allergies  Allergen Reactions  . Cymbalta [Duloxetine Hcl] Anaphylaxis and Itching    Lips and throat swelled; stopped breathing  . Levaquin [Levofloxacin In D5w] Anaphylaxis and Itching  . Lyrica [Pregabalin] Shortness Of Breath and Swelling    Lips and throat swelled  . 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 Sulfamethoxazole Anaphylaxis  . Codeine Anxiety    Sweating,nervous    Consultations: None Procedures/Studies: Dg Chest 2 View  Result Date: 08/03/2017 CLINICAL DATA:  Patient with sore throat.  Shortness of breath. EXAM: CHEST  2 VIEW COMPARISON:  Chest radiograph 08/01/2016 FINDINGS: Monitoring leads overlie the patient. Normal cardiac and mediastinal contours. Bibasilar heterogeneous pulmonary opacities. Trace bilateral pleural effusions. Thoracic spine degenerative changes. Gaseous distention of the stomach. IMPRESSION: Basilar heterogeneous opacities may represent atelectasis or mild edema. Electronically Signed   By: Lovey Newcomer M.D.   On: 08/03/2017 18:06     Subjective:   Discharge Exam: Vitals:   08/03/17 2047 08/03/17 2200  BP: 119/75 124/74  Pulse: 84 83  Resp: 20 20  Temp: 97.6 F (36.4 C) 98 F (36.7 C)  SpO2: 94% 92%   Vitals:   08/03/17 1705 08/03/17 1745 08/03/17 2047 08/03/17 2200  BP:  119/70 119/75 124/74  Pulse: (!) 116 (!) 107 84 83  Resp: 18 (!) 22 20 20   Temp:   97.6 F (36.4 C) 98 F (36.7 C)  TempSrc:   Oral Oral  SpO2: 98% 94% 94% 92%  Weight:    79.2 kg (174 lb 9.7 oz)  Height:    5\' 3"  (1.6 m)     General: Pt is alert, awake, not in acute distress Cardiovascular: RRR, S1/S2 +, no rubs, no gallops Respiratory: CTA bilaterally, no wheezing, no rhonchi Abdominal: Soft, NT, ND, bowel sounds + Extremities: no edema, no cyanosis    The results of significant diagnostics from this hospitalization (including imaging, microbiology, ancillary and laboratory) are listed below for reference.     Microbiology: No results found for this or any previous visit (from the past 240 hour(s)).   Labs: BNP (last 3 results)  Recent Labs  08/03/17 1804  BNP 94.1   Basic Metabolic Panel:  Recent Labs Lab 08/03/17 1806  NA 139  K 3.6  CL 111  CO2 21*  GLUCOSE 137*  BUN 9  CREATININE 0.48  CALCIUM 9.1   Liver Function Tests: No results for input(s): AST, ALT, ALKPHOS, BILITOT, PROT, ALBUMIN in the last 168 hours. No results for input(s): LIPASE, AMYLASE in the last 168 hours. No results for input(s): AMMONIA in the last 168 hours. CBC:  Recent Labs Lab 08/03/17 1806  WBC 7.5  NEUTROABS 5.2  HGB 15.1*  HCT 45.2  MCV 94.0  PLT 223   Cardiac Enzymes: No results for input(s): CKTOTAL, CKMB, CKMBINDEX, TROPONINI in the last 168 hours. BNP: Invalid input(s): POCBNP CBG: No results for input(s): GLUCAP in the last 168 hours. D-Dimer  Recent Labs  08/03/17 2222  DDIMER 0.30   Hgb A1c No results for input(s): HGBA1C in the last 72 hours. Lipid Profile No results for input(s): CHOL, HDL, LDLCALC, TRIG, CHOLHDL, LDLDIRECT in the last 72 hours. Thyroid function studies No results for input(s): TSH, T4TOTAL, T3FREE, THYROIDAB in the last 72 hours.  Invalid input(s): FREET3 Anemia work up No results for input(s): VITAMINB12, FOLATE, FERRITIN, TIBC, IRON, RETICCTPCT in the last 72 hours. Urinalysis    Component Value Date/Time   COLORURINE YELLOW 06/20/2016 1650   APPEARANCEUR CLEAR 06/20/2016 1650   LABSPEC 1.008 06/20/2016 1650   PHURINE 7.0 06/20/2016 1650    GLUCOSEU NEGATIVE 06/20/2016 1650   HGBUR NEGATIVE 06/20/2016 1650   BILIRUBINUR NEGATIVE 06/20/2016 1650   KETONESUR NEGATIVE 06/20/2016 1650   PROTEINUR NEGATIVE 06/20/2016 1650   UROBILINOGEN 0.2 10/08/2015 0059   NITRITE NEGATIVE 06/20/2016 1650   LEUKOCYTESUR TRACE (A) 06/20/2016 1650   Sepsis Labs Invalid input(s): PROCALCITONIN,  WBC,  LACTICIDVEN Microbiology No results found for this or any previous visit (from the past 240 hour(s)).   Time coordinating discharge: Less than 10 minutes (Left AMA)  SIGNED:   Vianne Bulls, MD  [facility] 08/05/2017, 7:08 PM Pager   If 7PM-7AM, please contact night-coverage [email or web address] Password [email or web address]

## 2017-11-15 ENCOUNTER — Inpatient Hospital Stay (EMERGENCY_DEPARTMENT_HOSPITAL)
Admission: AD | Admit: 2017-11-15 | Discharge: 2017-11-15 | Disposition: A | Discharge status: Home / Self Care | Payer: Medicaid Other | Source: Ambulatory Visit | Attending: Obstetrics & Gynecology | Admitting: Obstetrics & Gynecology

## 2017-11-15 ENCOUNTER — Emergency Department (HOSPITAL_BASED_OUTPATIENT_CLINIC_OR_DEPARTMENT_OTHER)
Admission: EM | Admit: 2017-11-15 | Discharge: 2017-11-15 | Discharge status: Against Medical Advice | Payer: Medicaid Other | Attending: Emergency Medicine | Admitting: Emergency Medicine

## 2017-11-15 ENCOUNTER — Encounter (HOSPITAL_BASED_OUTPATIENT_CLINIC_OR_DEPARTMENT_OTHER): Payer: Self-pay

## 2017-11-15 ENCOUNTER — Other Ambulatory Visit: Payer: Self-pay

## 2017-11-15 DIAGNOSIS — N3000 Acute cystitis without hematuria: Secondary | ICD-10-CM

## 2017-11-15 DIAGNOSIS — Z5321 Procedure and treatment not carried out due to patient leaving prior to being seen by health care provider: Secondary | ICD-10-CM | POA: Diagnosis not present

## 2017-11-15 DIAGNOSIS — R350 Frequency of micturition: Secondary | ICD-10-CM | POA: Insufficient documentation

## 2017-11-15 LAB — URINALYSIS, MICROSCOPIC (REFLEX)

## 2017-11-15 LAB — URINALYSIS, ROUTINE W REFLEX MICROSCOPIC
Glucose, UA: NEGATIVE mg/dL
Hgb urine dipstick: NEGATIVE
Ketones, ur: 15 mg/dL — AB
Nitrite: POSITIVE — AB
Protein, ur: 30 mg/dL — AB
Specific Gravity, Urine: >1.03 — ABNORMAL HIGH (ref 1.005–1.030)
pH: 6 (ref 5.0–8.0)

## 2017-11-15 MED ORDER — CEPHALEXIN 500 MG PO CAPS: 500.0000 mg | ORAL_CAPSULE | Freq: Three times a day (TID) | ORAL | 0 refills | Status: DC

## 2017-11-15 NOTE — ED Triage Notes (Signed)
C/o urinary freq x 5-6 days-states taking AZO w/o relief-NAD-steady gait

## 2017-11-15 NOTE — MAU Note (Signed)
Sheri Rivera CNM talked with pt in Triage regarding plan of care. Pt then d/c home from Triage. Pt seen earlier at Urgent [facility]

## 2017-11-15 NOTE — MAU Provider Note (Signed)
History     CSN: [id number]  Arrival date and time: 11/15/17 2133   None     Chief Complaint  Patient presents with  . Urinary Frequency   Sheri Rivera is a 56 y.o. Who presents today with frequency x 2-3 days. She has been taking azo without relief. She was at [facility] today, and left before being seen.    Urinary Frequency   This is a new problem. The current episode started in the past 7 days. The problem occurs every urination. The problem has been unchanged. The quality of the pain is described as shooting. The pain is mild. There has been no fever. Associated symptoms include frequency. Pertinent negatives include no chills, nausea or vomiting.    Past Medical History:  Diagnosis Date  . Anxiety 11/11/11  . Arthritis   . Asthma   . Bursitis    right shoulder  . Chronic back pain   . Chronic neck pain   . Chronic shoulder pain   . COPD (chronic obstructive pulmonary disease) ([place])   . DDD (degenerative disc disease)   . Depression   . Fibromyalgia   . GERD (gastroesophageal reflux disease)   . Heart murmur    as a child per pt  . Herpes   . IBS (irritable bowel syndrome)   . Lumbar radiculopathy   . Seasonal allergies   . Thyroid nodule     Past Surgical History:  Procedure Laterality Date  . CHOLECYSTECTOMY    . HERNIA REPAIR     Hiatal   . HIATAL HERNIA REPAIR    . TUBAL LIGATION  1983    Family History  Problem Relation Age of Onset  . Lung cancer Mother   . Arthritis Mother        Rheumatoid  . Bursitis Mother   . Asthma Mother   . Heart attack Father 29       Died of MI  . Obesity Father   . Fibromyalgia Sister   . Asthma Child     Social History   Tobacco Use  . Smoking status: Current Every Day Smoker    Packs/day: 0.25    Years: 27.00    Pack years: 6.75    Types: Cigarettes  . Smokeless tobacco: Never Used  Substance Use Topics  . Alcohol use: No  . Drug use: No    Allergies:  Allergies  Allergen Reactions  . Cymbalta  [Duloxetine Hcl] Anaphylaxis and Itching    Lips and throat swelled; stopped breathing  . Levaquin [Levofloxacin In D5w] Anaphylaxis and Itching  . Lyrica [Pregabalin] Shortness Of Breath and Swelling    Lips and throat swelled  . 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 Sulfamethoxazole Anaphylaxis  . Codeine Anxiety    Sweating,nervous    Medications Prior to Admission  Medication Sig Dispense Refill Last Dose  . albuterol (PROVENTIL HFA;VENTOLIN HFA) 108 (90 BASE) MCG/ACT inhaler Inhale 2 puffs into the lungs every 6 (six) hours as needed for shortness of breath. Shortness of breath   08/03/2017 at Unknown time  . albuterol (PROVENTIL) (2.5 MG/3ML) 0.083% nebulizer solution Take 3 mLs (2.5 mg total) by nebulization every 4 (four) hours as needed for wheezing or shortness of breath. Shortness of breath 25 vial 0 Past Month at Unknown time  . fluticasone (FLONASE) 50 MCG/ACT nasal spray Place into both nostrils daily.     Marland Kitchen LORazepam (ATIVAN) 1 MG tablet Take 0.5 mg by mouth every  8 (eight) hours as needed for anxiety.    08/02/2017 at Unknown time  . sertraline (ZOLOFT) 50 MG tablet Take 50 mg by mouth daily.   08/02/2017 at Unknown time  . simethicone (GAS-X) 80 MG chewable tablet Chew 80 mg by mouth every 6 (six) hours as needed for flatulence.   08/03/2017 at Unknown time    Review of Systems  Constitutional: Negative for chills and fever.  Gastrointestinal: Negative for nausea and vomiting.  Genitourinary: Positive for frequency.   Physical Exam   There were no vitals taken for this visit.  Physical Exam  Nursing note and vitals reviewed. Constitutional: She is oriented to person, place, and time. She appears well-developed and well-nourished. No distress.  HENT:  Head: Normocephalic.  Cardiovascular: Normal rate.  Respiratory: Effort normal.  GI: Soft. There is no tenderness. There is no rebound.  Neurological: She is alert and oriented to person, place, and time.  Skin:  Skin is warm and dry.  Psychiatric: She has a normal mood and affect.     Results for orders placed or performed during the hospital encounter of 11/15/17 (from the past 24 hour(s))  Urinalysis, Routine w reflex microscopic     Status: Abnormal   Collection Time: 11/15/17  7:44 PM  Result Value Ref Range   Color, Urine YELLOW YELLOW   APPearance CLOUDY (A) CLEAR   Specific Gravity, Urine >1.030 (H) 1.005 - 1.030   pH 6.0 5.0 - 8.0   Glucose, UA NEGATIVE NEGATIVE mg/dL   Hgb urine dipstick NEGATIVE NEGATIVE   Bilirubin Urine SMALL (A) NEGATIVE   Ketones, ur 15 (A) NEGATIVE mg/dL   Protein, ur 30 (A) NEGATIVE mg/dL   Nitrite POSITIVE (A) NEGATIVE   Leukocytes, UA MODERATE (A) NEGATIVE  Urinalysis, Microscopic (reflex)     Status: Abnormal   Collection Time: 11/15/17  7:44 PM  Result Value Ref Range   RBC / HPF 0-5 0 - 5 RBC/hpf   WBC, UA TOO NUMEROUS TO COUNT 0 - 5 WBC/hpf   Bacteria, UA MANY (A) NONE SEEN   Squamous Epithelial / LPF 6-30 (A) NONE SEEN    MAU Course  Procedures  MDM   Assessment and Plan   1. Acute cystitis without hematuria    DC home Comfort measures reviewed  RX: keflex TID x 7 days  Return to MAU as needed   Follow-up Information    Department, [facility] Follow up.   Contact information: [place] [address] [phone]            Marcille Buffy 11/15/2017, 9:56 PM

## 2017-11-15 NOTE — Discharge Instructions (Signed)
In late 2019, the [facility] will be moving to the [place] campus. At that time, the MAU (Maternity Admissions Unit), where you are being seen today, will no longer take care of non-pregnant patients. We strongly encourage you to find a doctor's office before that time, so that you can be seen with any GYN concerns, like vaginal discharge, urinary tract infection, etc.. in a timely manner. ° °In order to make an office visit more convenient, the [facility] will be offering evening hours with same-day appointments, walk-in appointments and scheduled appointments available during this time. ° °[facility] Hours: °Monday - 8am - 7:30 pm with walk-in between 4pm- 7:30 pm °Tuesday - 8 am - 5 pm (starting 02/25/18 we will be open late and accepting walk-ins from 4pm - 7:30pm) °Wednesday - 8 am - 5 pm (starting 05/28/18 we will be open late and accepting walk-ins from 4pm - 7:30pm) °Thursday 8 am - 5 pm (starting 08/28/18 we will be open late and accepting walk-ins from 4pm - 7:30pm) °Friday 8 am - 5 pm ° °For an appointment please call the [facility] at [phone] ° °For urgent needs, [facility] is also available for management of urgent GYN complaints such as vaginal discharge or urinary tract infections. ° °Primary care follow up  °[facility] (will see you even if you do not have sickle cell): [phone] °[facility]: [phone] °[facility]: [phone] ° ° ° °

## 2017-11-18 LAB — URINE CULTURE: Culture: >=100000 — AB

## 2017-11-19 ENCOUNTER — Telehealth: Payer: Self-pay

## 2017-11-19 NOTE — Telephone Encounter (Signed)
Post ED Visit - Positive Culture Follow-up  Culture report reviewed by antimicrobial stewardship pharmacist:  []  Elenor Quinones, Pharm.D. []  Heide Guile, Pharm.D., BCPS AQ-ID []  Parks Neptune, Pharm.D., BCPS []  Alycia Rossetti, Pharm.D., BCPS []  [place], [place].D., BCPS, AAHIVP []  Legrand Como, Pharm.D., BCPS, AAHIVP [x]  Salome Arnt, PharmD, BCPS []  Dimitri Ped, PharmD, BCPS []  Vincenza Hews, PharmD, BCPS  Positive urine culture Treated with Keflex, organism sensitive to the same and no further patient follow-up is required at this time.  Genia Del 11/19/2017, 10:16 AM

## 2017-11-20 ENCOUNTER — Telehealth: Payer: Self-pay | Admitting: Emergency Medicine

## 2017-11-20 NOTE — Telephone Encounter (Signed)
Post ED Visit - Positive Culture Follow-up  Culture report reviewed by antimicrobial stewardship pharmacist:  []  Elenor Quinones, Pharm.D. []  Heide Guile, Pharm.D., BCPS AQ-ID []  Parks Neptune, Pharm.D., BCPS []  Alycia Rossetti, Pharm.D., BCPS []  [place], [place].D., BCPS, AAHIVP []  Legrand Como, Pharm.D., BCPS, AAHIVP [x]  Salome Arnt, PharmD, BCPS []  Dimitri Ped, PharmD, BCPS []  Vincenza Hews, PharmD, BCPS  Positive urine culture Treated with cefalexin, organism sensitive to the same and no further patient follow-up is required at this time.  Hazle Nordmann 11/20/2017, 9:09 AM

## 2018-01-14 ENCOUNTER — Other Ambulatory Visit: Payer: Self-pay | Admitting: Otolaryngology

## 2018-01-14 ENCOUNTER — Ambulatory Visit
Admission: RE | Admit: 2018-01-14 | Discharge: 2018-01-14 | Disposition: A | Discharge status: Home / Self Care | Payer: Medicaid Other | Source: Ambulatory Visit | Attending: Otolaryngology | Admitting: Otolaryngology

## 2018-01-14 DIAGNOSIS — J329 Chronic sinusitis, unspecified: Secondary | ICD-10-CM

## 2018-02-05 ENCOUNTER — Ambulatory Visit: Payer: Medicaid Other | Attending: Otolaryngology | Admitting: Audiology

## 2018-02-05 DIAGNOSIS — Z5329 Procedure and treatment not carried out because of patient's decision for other reasons: Secondary | ICD-10-CM | POA: Insufficient documentation

## 2018-02-06 ENCOUNTER — Other Ambulatory Visit: Payer: Self-pay | Admitting: Orthopedic Surgery

## 2018-02-06 DIAGNOSIS — G959 Disease of spinal cord, unspecified: Secondary | ICD-10-CM

## 2018-02-06 DIAGNOSIS — M5410 Radiculopathy, site unspecified: Secondary | ICD-10-CM

## 2018-02-06 NOTE — Progress Notes (Signed)
Dear Dr. Ernesto Rutherford,  Just wanted to let you know that Sheri Rivera no showed for her audiology appointment her yesterday.  I was out of town last week, but was able to create a work in appointment slot when I returned Monday. Our secretary called on Monday and scheduled the audiology appointment for 02/05/2018.  On 02/04/2018 I called myself with the intent of reminding her of the appointment, providing our address/phone number and to see whether she would be able to come at 3pm to allow time if needed to complete a Brainstem Auditory Evoked Response test if indicated. I was only able to leave a message.  I just wanted to let you know.  I hope that she is continuing with your care.  We will be happy to reschedule her if needed.  Debroah Shuttleworth L. Heide Spark, Au.D., CCC-A Doctor of Audiology 02/06/2018

## 2018-02-22 ENCOUNTER — Emergency Department (HOSPITAL_COMMUNITY)
Admission: EM | Admit: 2018-02-22 | Discharge: 2018-02-22 | Disposition: A | Discharge status: Home / Self Care | Payer: Medicaid Other | Attending: Emergency Medicine | Admitting: Emergency Medicine

## 2018-02-22 ENCOUNTER — Encounter (HOSPITAL_COMMUNITY): Payer: Self-pay | Admitting: Emergency Medicine

## 2018-02-22 ENCOUNTER — Emergency Department (HOSPITAL_COMMUNITY): Payer: Medicaid Other

## 2018-02-22 DIAGNOSIS — Z5321 Procedure and treatment not carried out due to patient leaving prior to being seen by health care provider: Secondary | ICD-10-CM | POA: Diagnosis not present

## 2018-02-22 DIAGNOSIS — R0602 Shortness of breath: Secondary | ICD-10-CM | POA: Diagnosis present

## 2018-02-22 MED ORDER — ALBUTEROL SULFATE (2.5 MG/3ML) 0.083% IN NEBU
5.0000 mg | INHALATION_SOLUTION | Freq: Once | RESPIRATORY_TRACT | Status: AC
  Administered 2018-02-22: 5 mg via RESPIRATORY_TRACT
  Filled 2018-02-22: qty 6

## 2018-02-22 NOTE — ED Triage Notes (Addendum)
Pt c/o SOB that gotten worse over past couple days. Say PCP and was started on Pro-air and took 2 neb treatments but still SOB with exertion. Pt has COPD and asthma and still smokes. Recently started. on antibiotics for ear infection

## 2018-02-22 NOTE — ED Notes (Signed)
Pt called from the lobby with no response x1 

## 2018-02-25 ENCOUNTER — Other Ambulatory Visit (HOSPITAL_COMMUNITY): Payer: Self-pay | Admitting: Otolaryngology

## 2018-02-25 DIAGNOSIS — M542 Cervicalgia: Secondary | ICD-10-CM

## 2018-03-03 ENCOUNTER — Other Ambulatory Visit: Payer: Medicaid Other

## 2018-03-12 ENCOUNTER — Ambulatory Visit
Admission: RE | Admit: 2018-03-12 | Discharge: 2018-03-12 | Disposition: A | Discharge status: Home / Self Care | Payer: Medicaid Other | Source: Ambulatory Visit | Attending: Orthopedic Surgery | Admitting: Orthopedic Surgery

## 2018-03-12 DIAGNOSIS — M5410 Radiculopathy, site unspecified: Secondary | ICD-10-CM

## 2018-03-12 DIAGNOSIS — G959 Disease of spinal cord, unspecified: Secondary | ICD-10-CM

## 2018-03-14 ENCOUNTER — Ambulatory Visit (HOSPITAL_COMMUNITY): Admission: RE | Admit: 2018-03-14 | Payer: Medicaid Other | Source: Ambulatory Visit

## 2018-03-14 ENCOUNTER — Other Ambulatory Visit: Payer: Self-pay | Admitting: Orthopedic Surgery

## 2018-03-14 DIAGNOSIS — M545 Low back pain: Secondary | ICD-10-CM

## 2018-04-15 ENCOUNTER — Emergency Department (HOSPITAL_COMMUNITY)
Admission: EM | Admit: 2018-04-15 | Discharge: 2018-04-15 | Disposition: A | Discharge status: Home / Self Care | Payer: Medicaid Other | Attending: Emergency Medicine | Admitting: Emergency Medicine

## 2018-04-15 ENCOUNTER — Encounter (HOSPITAL_COMMUNITY): Payer: Self-pay | Admitting: Emergency Medicine

## 2018-04-15 ENCOUNTER — Emergency Department (HOSPITAL_COMMUNITY): Payer: Medicaid Other

## 2018-04-15 DIAGNOSIS — M545 Low back pain: Secondary | ICD-10-CM | POA: Diagnosis not present

## 2018-04-15 DIAGNOSIS — F17210 Nicotine dependence, cigarettes, uncomplicated: Secondary | ICD-10-CM | POA: Diagnosis not present

## 2018-04-15 DIAGNOSIS — R531 Weakness: Secondary | ICD-10-CM | POA: Insufficient documentation

## 2018-04-15 DIAGNOSIS — R29898 Other symptoms and signs involving the musculoskeletal system: Secondary | ICD-10-CM

## 2018-04-15 DIAGNOSIS — M48061 Spinal stenosis, lumbar region without neurogenic claudication: Secondary | ICD-10-CM | POA: Insufficient documentation

## 2018-04-15 DIAGNOSIS — J449 Chronic obstructive pulmonary disease, unspecified: Secondary | ICD-10-CM | POA: Insufficient documentation

## 2018-04-15 DIAGNOSIS — Z79899 Other long term (current) drug therapy: Secondary | ICD-10-CM | POA: Diagnosis not present

## 2018-04-15 DIAGNOSIS — G8929 Other chronic pain: Secondary | ICD-10-CM

## 2018-04-15 DIAGNOSIS — R200 Anesthesia of skin: Secondary | ICD-10-CM | POA: Diagnosis present

## 2018-04-15 LAB — I-STAT TROPONIN, ED: Troponin i, poc: 0 ng/mL (ref 0.00–0.08)

## 2018-04-15 LAB — CBC WITH DIFFERENTIAL/PLATELET
Abs Immature Granulocytes: 0 10*3/uL (ref 0.0–0.1)
Basophils Absolute: 0.1 10*3/uL (ref 0.0–0.1)
Basophils Relative: 1 %
Eosinophils Absolute: 0.1 10*3/uL (ref 0.0–0.7)
Eosinophils Relative: 1 %
HCT: 49.8 % — ABNORMAL HIGH (ref 36.0–46.0)
Hemoglobin: 16.3 g/dL — ABNORMAL HIGH (ref 12.0–15.0)
Immature Granulocytes: 0 %
Lymphocytes Relative: 35 %
Lymphs Abs: 3 10*3/uL (ref 0.7–4.0)
MCH: 30.5 pg (ref 26.0–34.0)
MCHC: 32.7 g/dL (ref 30.0–36.0)
MCV: 93.3 fL (ref 78.0–100.0)
Monocytes Absolute: 0.6 10*3/uL (ref 0.1–1.0)
Monocytes Relative: 7 %
Neutro Abs: 4.7 10*3/uL (ref 1.7–7.7)
Neutrophils Relative %: 56 %
Platelets: 290 10*3/uL (ref 150–400)
RBC: 5.34 MIL/uL — ABNORMAL HIGH (ref 3.87–5.11)
RDW: 13.3 % (ref 11.5–15.5)
WBC: 8.4 10*3/uL (ref 4.0–10.5)

## 2018-04-15 LAB — COMPREHENSIVE METABOLIC PANEL
ALT: 16 U/L (ref 14–54)
AST: 18 U/L (ref 15–41)
Albumin: 3.3 g/dL — ABNORMAL LOW (ref 3.5–5.0)
Alkaline Phosphatase: 81 U/L (ref 38–126)
Anion gap: 8 (ref 5–15)
BUN: 6 mg/dL (ref 6–20)
CO2: 23 mmol/L (ref 22–32)
Calcium: 9 mg/dL (ref 8.9–10.3)
Chloride: 108 mmol/L (ref 101–111)
Creatinine, Ser: 0.6 mg/dL (ref 0.44–1.00)
GFR calc Af Amer: >60 mL/min (ref >60)
GFR calc non Af Amer: >60 mL/min (ref >60)
Glucose, Bld: 102 mg/dL — ABNORMAL HIGH (ref 65–99)
Potassium: 3.7 mmol/L (ref 3.5–5.1)
Sodium: 139 mmol/L (ref 135–145)
Total Bilirubin: 0.9 mg/dL (ref 0.3–1.2)
Total Protein: 5.9 g/dL — ABNORMAL LOW (ref 6.5–8.1)

## 2018-04-15 MED ORDER — HYDROCODONE-ACETAMINOPHEN 5-325 MG PO TABS
2.0000 | ORAL_TABLET | Freq: Once | ORAL | Status: AC
  Administered 2018-04-15: 2 via ORAL
  Filled 2018-04-15: qty 2

## 2018-04-15 MED ORDER — METHOCARBAMOL 500 MG PO TABS: 500.0000 mg | ORAL_TABLET | Freq: Two times a day (BID) | ORAL | 0 refills | Status: DC | PRN

## 2018-04-15 MED ORDER — GADOBENATE DIMEGLUMINE 529 MG/ML IV SOLN
18.0000 mL | Freq: Once | INTRAVENOUS | Status: AC
  Administered 2018-04-15: 18 mL via INTRAVENOUS

## 2018-04-15 MED ORDER — KETOROLAC TROMETHAMINE 15 MG/ML IJ SOLN
15.0000 mg | Freq: Once | INTRAMUSCULAR | Status: AC
  Administered 2018-04-15: 15 mg via INTRAVENOUS
  Filled 2018-04-15: qty 1

## 2018-04-15 NOTE — ED Provider Notes (Signed)
[facility] EMERGENCY DEPARTMENT Provider Note   CSN: [id number] Arrival date & time: 04/15/18  1306     History   Chief Complaint Chief Complaint  Patient presents with  . leg numbness    bilateral  . Blurred Vision    intermittent for several days    HPI BERNADETTE GORES is a 57 y.o. female.  The history is provided by the patient.  Back Pain   This is a chronic problem. The current episode started more than 1 week ago. The problem occurs constantly. The problem has been gradually worsening. The pain is associated with no known injury. The pain is present in the lumbar spine. Quality: throbbing. The pain is at a severity of 10/10. The pain is severe. The symptoms are aggravated by certain positions, bending and twisting. The pain is the same all the time. Associated symptoms include numbness and weakness. Pertinent negatives include no chest pain, no fever, no abdominal pain, no abdominal swelling, no bowel incontinence, no perianal numbness, no bladder incontinence and no dysuria. She has tried NSAIDs and muscle relaxants for the symptoms.    Past Medical History:  Diagnosis Date  . Anxiety 11/11/11  . Arthritis   . Asthma   . Bursitis    right shoulder  . Chronic back pain   . Chronic neck pain   . Chronic shoulder pain   . COPD (chronic obstructive pulmonary disease) ([place])   . DDD (degenerative disc disease)   . Depression   . Fibromyalgia   . GERD (gastroesophageal reflux disease)   . Heart murmur    as a child per pt  . Herpes   . IBS (irritable bowel syndrome)   . Lumbar radiculopathy   . Seasonal allergies   . Thyroid nodule     Patient Active Problem List   Diagnosis Date Noted  . Acute respiratory failure with hypoxia ([place]) 08/03/2017  . GERD (gastroesophageal reflux disease) 06/13/2016  . Chronic obstructive pulmonary disease ([place]) 06/13/2016  . Current tobacco use 09/05/2015  . History of fracture of vertebra 02/16/2015  .  Diffuse pain 04/03/2014  . Hemorrhage, postmenopausal 12/29/2013  . Chronic obstructive pulmonary disease with acute exacerbation ([place]) 10/30/2013  . COPD mixed type ([place]) 03/30/2013  . Abdominal pain, epigastric 02/25/2013  . Dysphagia, unspecified(787.20) 02/25/2013  . Gas 02/25/2013  . Abdominal bloating 02/25/2013  . Precordial pain 02/16/2013  . Multinodular goiter 10/05/2012  . 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 Bladder cystocele 07/21/2012  . Clinical depression 11/11/2011  . Allergic rhinitis 11/11/2011  . URI (upper respiratory infection) 09/28/2011  . Sore throat 07/18/2011  . Neck swelling 06/22/2011  . Ingrown right big toenail 05/23/2011  . Chronic back pain 03/16/2011  . 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/05/2010  . Shortness of breath 03/15/2010  . GOITER, MULTINODULAR 07/01/2009  . FIBROMYALGIA 07/01/2009  . ALLERGIC RHINITIS CAUSE UNSPECIFIED 03/02/2008  . Anxiety 01/23/2007  . TOBACCO DEPENDENCE 01/23/2007  . GASTROESOPHAGEAL REFLUX, NO ESOPHAGITIS 01/23/2007    Past Surgical History:  Procedure Laterality Date  . CHOLECYSTECTOMY    . HERNIA REPAIR     [place]   . HIATAL HERNIA REPAIR    . TUBAL LIGATION  1983     OB History   None      Home Medications    Prior to Admission medications   Medication Sig Start Date End Date Taking? Authorizing Provider  albuterol (PROVENTIL HFA;VENTOLIN HFA) 108 (90 BASE) MCG/ACT inhaler Inhale 2 puffs into the  lungs every 6 (six) hours as needed for shortness of breath. Shortness of breath   Yes [provider]  albuterol (PROVENTIL) (2.5 MG/3ML) 0.083% nebulizer solution Take 3 mLs (2.5 mg total) by nebulization every 4 (four) hours as needed for wheezing or shortness of breath. Shortness of breath 08/02/16  Yes [place], Isabeau Mccalla, MD  ibuprofen (ADVIL,MOTRIN) 600 MG tablet Take 600 mg by mouth 2 (two) times daily. 10/29/13  Yes [provider]  LORazepam (ATIVAN) 1 MG tablet Take 0.5 mg by mouth every  8 (eight) hours as needed for anxiety.  03/16/11  Yes [place], Modena Nunnery, MD  simethicone (GAS-X) 80 MG chewable tablet Chew 80 mg by mouth every 6 (six) hours as needed for flatulence.   Yes [provider]  methocarbamol (ROBAXIN) 500 MG tablet Take 1 tablet (500 mg total) by mouth 2 (two) times daily as needed for muscle spasms. 04/15/18   Clifton James, MD    Family History Family History  Problem Relation Age of Onset  . Lung cancer Mother   . Arthritis Mother        Rheumatoid  . Bursitis Mother   . Asthma Mother   . Heart attack Father 40       Died of MI  . Obesity Father   . Fibromyalgia Sister   . Asthma Child     Social History Social History   Tobacco Use  . Smoking status: Current Every Day Smoker    Packs/day: 0.25    Years: 27.00    Pack years: 6.75    Types: Cigarettes  . Smokeless tobacco: Never Used  Substance Use Topics  . Alcohol use: No  . Drug use: No     Allergies   Clindamycin/lincomycin; Cymbalta [duloxetine hcl]; Levaquin [levofloxacin in d5w]; Lyrica [pregabalin]; Shellfish allergy; Sulfamethoxazole; and Codeine   Review of Systems Review of Systems  Constitutional: Negative for chills and fever.  HENT: Negative for ear pain and sore throat.   Eyes: Negative for pain and visual disturbance.  Respiratory: Negative for cough and shortness of breath.   Cardiovascular: Negative for chest pain and palpitations.  Gastrointestinal: Negative for abdominal pain, bowel incontinence and vomiting.  Genitourinary: Negative for bladder incontinence, dysuria and hematuria.  Musculoskeletal: Positive for back pain and gait problem. Negative for arthralgias.  Skin: Negative for color change and rash.  Neurological: Positive for dizziness, weakness and numbness. Negative for seizures and syncope.  All other systems reviewed and are negative.    Physical Exam Updated Vital Signs BP 118/82   Pulse 77   Temp 98.4 F (36.9 C) (Oral)    Resp 18   Ht 5\' 3"  (1.6 m)   Wt 80.7 kg (178 lb)   SpO2 96%   BMI 31.53 kg/m   Physical Exam  Constitutional: She is oriented to person, place, and time. She appears well-developed and well-nourished. No distress.  HENT:  Head: Normocephalic and atraumatic.  Eyes: Pupils are equal, round, and reactive to light. Conjunctivae are normal.  Neck: Neck supple.  Cardiovascular: Normal rate and regular rhythm.  No murmur heard. Pulmonary/Chest: Effort normal and breath sounds normal. No respiratory distress.  Abdominal: Soft. She exhibits no distension. There is no tenderness.  Musculoskeletal: Normal range of motion. She exhibits tenderness. She exhibits no edema or deformity.  Tenderness throughout midline lumbar spine as well as lumbar paraspinal musculature.  Neurological: She is alert and oriented to person, place, and time.  Strength is 5/5 in b/l upper extremities.  Strength is 4/5 throughout b/l lower extremities. Sensation to light touch is intact throughout. Normal finger to nose and heel to shin.  Skin: Skin is warm and dry.  Psychiatric: She has a normal mood and affect.  Nursing note and vitals reviewed.    ED Treatments / Results  Labs (all labs ordered are listed, but only abnormal results are displayed) Labs Reviewed  COMPREHENSIVE METABOLIC PANEL - Abnormal; Notable for the following components:      Result Value   Glucose, Bld 102 (*)    Total Protein 5.9 (*)    Albumin 3.3 (*)    All other components within normal limits  CBC WITH DIFFERENTIAL/PLATELET - Abnormal; Notable for the following components:   RBC 5.34 (*)    Hemoglobin 16.3 (*)    HCT 49.8 (*)    All other components within normal limits  I-STAT TROPONIN, ED    EKG None  Radiology Ct Head Wo Contrast  Result Date: 04/15/2018 CLINICAL DATA:  Blurred vision, weakness EXAM: CT HEAD WITHOUT CONTRAST TECHNIQUE: Contiguous axial images were obtained from the base of the skull through the vertex  without intravenous contrast. COMPARISON:  MRI 01/02/2013 FINDINGS: Brain: No acute intracranial abnormality. Specifically, no hemorrhage, hydrocephalus, mass lesion, acute infarction, or significant intracranial injury. Vascular: No hyperdense vessel or unexpected calcification. Skull: No acute calvarial abnormality. Sinuses/Orbits: Visualized paranasal sinuses and mastoids clear. Orbital soft tissues unremarkable. Other: None IMPRESSION: No intracranial abnormality. Electronically Signed   By: Rolm Baptise M.D.   On: 04/15/2018 14:21   Mr Lumbar Spine W Wo Contrast  Result Date: 04/15/2018 CLINICAL DATA:  Worsening low back pain with bilateral lower extremity weakness and numbness. EXAM: MRI LUMBAR SPINE WITHOUT AND WITH CONTRAST TECHNIQUE: Multiplanar and multiecho pulse sequences of the lumbar spine were obtained without and with intravenous contrast. CONTRAST:  1mL MULTIHANCE GADOBENATE DIMEGLUMINE 529 MG/ML IV SOLN COMPARISON:  Lumbar spine MRI 03/12/2018 FINDINGS: Segmentation: Normal. The lowest disc space is considered to be L5-S1. Alignment:  Normal Vertebrae: No acute compression fracture, discitis-osteomyelitis of focal marrow lesion. Conus medullaris and cauda equina: The conus medullaris terminates at the L2 level. The cauda equina and conus medullaris are both normal. Paraspinal and other soft tissues: The visualized aorta, IVC and iliac vessels are normal. The visualized retroperitoneal organs and paraspinal soft tissues are normal. Disc levels: T11-L1 is visualized in the sagittal plane only, with no disc herniation or stenosis. L1-L2: Normal. L2-L3: Disc desiccation with superior L3 endplate Schmorl's node, unchanged. No stenosis or herniation. L3-L4: Mild disc bulge without spinal canal stenosis. No neural impingement. L4-L5: Unchanged small disc bulge with associated left foraminal protrusion. Mild left foraminal stenosis is unchanged. L5-S1: Disc space narrowing with mild diffuse bulge.  Unchanged moderate right neural foraminal stenosis. The visualized sacrum is normal. IMPRESSION: 1. Unchanged moderate right L5 and left L4 foraminal stenosis. 2. No spinal canal stenosis or acute abnormality. Electronically Signed   By: Ulyses Jarred M.D.   On: 04/15/2018 16:29    Procedures Procedures (including critical care time)  Medications Ordered in ED Medications  HYDROcodone-acetaminophen (NORCO/VICODIN) 5-325 MG per tablet 2 tablet (2 tablets Oral Given 04/15/18 1417)  gadobenate dimeglumine (MULTIHANCE) injection 18 mL (18 mLs Intravenous Contrast Given 04/15/18 1601)  ketorolac (TORADOL) 15 MG/ML injection 15 mg (15 mg Intravenous Given 04/15/18 1954)     Initial Impression / Assessment and Plan / ED Course  I have reviewed the triage vital signs and the nursing notes.  Pertinent labs &  imaging results that were available during my care of the patient were reviewed by me and considered in my medical decision making (see chart for details).    Patient is a 57 year old female with history as above, notable for chronic back pain, who presents with exacerbation of her chronic pain.  She reports that her pain is been getting worse over the last several days.  She is now experiencing weakness and numbness in bilateral lower extremities.  Of note, she had MRIs performed of her cervical and lumbar spine on 03/12/2018.  She states her symptoms have worsened since then and she is now having difficulty walking.  On my exam, she has tenderness throughout her lumbar spine.  She has symmetrically reduced strength throughout bilateral lower extremities.  She denies any bowel or bladder changes.  In the first look process, an MRI was ordered and shows no acute changes from her MRI performed last month.  The patient is very frustrated with her poor pain control.  She has not yet seen a pain doctor.  He reports that she has been taking NSAIDs without relief.  I will start her on Robaxin.  We discussed  pain clinic, however she would need to obtain referral from her primary care physician.  The remainder of her laboratory work-up was unremarkable.  She is stable for discharge at this time.  Return precautions discussed in detail.  Final Clinical Impressions(s) / ED Diagnoses   Final diagnoses:  Chronic midline low back pain without sciatica  Leg weakness, bilateral  Spinal stenosis at L4-L5 level    ED Discharge Orders        Ordered    methocarbamol (ROBAXIN) 500 MG tablet  2 times daily PRN     04/15/18 2038       Clifton James, MD 04/15/18 2302    Fatima Blank, MD 04/16/18 [phone]

## 2018-04-15 NOTE — ED Triage Notes (Signed)
Pt reports chronic back pain. Pt has been having worsening back pain and saw [facility] last Monday after having MRI. Pt states for several days has had blurred vision off and on with weakness when walking. Pt states "It is because of my back." Pt states they gave her a referral to neurology but she has not seen them yet. Pt states both legs have been swollen for 2 days and last night they became numb but today they both are numb. Pt very anxious.

## 2018-04-15 NOTE — ED Notes (Signed)
Patient given coffee to help facilitate a urine sample.

## 2018-04-15 NOTE — ED Notes (Signed)
Urine sample has been requested from pt, states she just recently went to the restroom. Informed pt to let a staff member know the next time she has to go to a sample can be obtained.

## 2018-04-15 NOTE — ED Provider Notes (Signed)
Patient placed in Quick Look pathway, seen and evaluated   Chief Complaint: BLE numbness, worsening back pain  HPI:   57 y.o. hospital for chronic back and neck pain who presents for evaluation of worsening lower back pain, bilateral lower extremity weakness/numbness that is been ongoing for the last week that progressively worsened in the last 24 to 48 hours.  Patient states that over the last week, she has had worsening lower back pain.  Patient reports that she started feeling " off balance, dizzy, generalized weakness" few days ago and states that it progressively worsened over the last 24 hours.  Patient reports that last night, she could not stand up to do the dishes and states that she felt like her legs were both numb and weak.  Patient reports difficulty moving them.  Patient denies any new preceding trauma, injury, fall.  Patient states that she had recently had an MRI done in April that was unremarkable.  She is followed by different orthopedics for management of her chronic back pain.  Patient denies any recent fevers, saddle anesthesia, urinary bowel incontinence, numbness/weakness of her arms or legs.   ROS: Back Pain   Physical Exam:   Gen: Very anxious but no acute distress  Neuro: Awake and Alert  Skin: Warm    Focused Exam:   Cranial nerves III-XII intact Follows commands, Moves all extremities  5/5 strength to BUE.  Slightly diminished strength bilaterally to lower extremities.  Patient was able to pull herself out of the chair and walk a few steps. Reports decreased sensation from approximately the mid thighs that extends distally, though she is able to feel light touch. Sensation intact throughout all major nerve distributions Normal finger to nose. No dysdiadochokinesia. No pronator drift. No gait abnormalities  No slurred speech. No facial droop.   Initiation of care has begun. The patient has been counseled on the process, plan, and necessity for staying for the  completion/evaluation, and the remainder of the medical screening examination    Desma Mcgregor 04/15/18 1354    Margette Fast, MD 04/15/18 1950

## 2018-04-15 NOTE — Discharge Instructions (Addendum)
Here are the results from  your lumbar MRI:  IMPRESSION: 1. Unchanged moderate right L5 and left L4 foraminal stenosis. 2. No spinal canal stenosis or acute abnormality.

## 2018-05-13 ENCOUNTER — Ambulatory Visit (HOSPITAL_COMMUNITY): Payer: Medicaid Other

## 2018-05-26 ENCOUNTER — Ambulatory Visit (HOSPITAL_COMMUNITY)
Admission: RE | Admit: 2018-05-26 | Discharge: 2018-05-26 | Disposition: A | Discharge status: Home / Self Care | Payer: Medicaid Other | Source: Ambulatory Visit | Attending: Otolaryngology | Admitting: Otolaryngology

## 2018-05-26 DIAGNOSIS — M542 Cervicalgia: Secondary | ICD-10-CM | POA: Insufficient documentation

## 2018-06-22 ENCOUNTER — Other Ambulatory Visit: Payer: Self-pay

## 2018-06-22 ENCOUNTER — Encounter (HOSPITAL_COMMUNITY): Payer: Self-pay

## 2018-06-22 ENCOUNTER — Emergency Department (HOSPITAL_COMMUNITY): Payer: Medicaid Other

## 2018-06-22 ENCOUNTER — Emergency Department (HOSPITAL_COMMUNITY)
Admission: EM | Admit: 2018-06-22 | Discharge: 2018-06-22 | Disposition: A | Discharge status: Home / Self Care | Payer: Medicaid Other | Attending: Emergency Medicine | Admitting: Emergency Medicine

## 2018-06-22 DIAGNOSIS — H6501 Acute serous otitis media, right ear: Secondary | ICD-10-CM | POA: Insufficient documentation

## 2018-06-22 DIAGNOSIS — F17210 Nicotine dependence, cigarettes, uncomplicated: Secondary | ICD-10-CM | POA: Diagnosis not present

## 2018-06-22 DIAGNOSIS — J029 Acute pharyngitis, unspecified: Secondary | ICD-10-CM

## 2018-06-22 DIAGNOSIS — Z79899 Other long term (current) drug therapy: Secondary | ICD-10-CM | POA: Diagnosis not present

## 2018-06-22 DIAGNOSIS — J0100 Acute maxillary sinusitis, unspecified: Secondary | ICD-10-CM

## 2018-06-22 DIAGNOSIS — J45909 Unspecified asthma, uncomplicated: Secondary | ICD-10-CM | POA: Insufficient documentation

## 2018-06-22 LAB — GROUP A STREP BY PCR: Group A Strep by PCR: NOT DETECTED

## 2018-06-22 MED ORDER — LIDOCAINE VISCOUS HCL 2 % MT SOLN
15.0000 mL | Freq: Once | OROMUCOSAL | Status: AC
Start: 2018-06-22 — End: 2018-06-22
  Administered 2018-06-22: 15 mL via OROMUCOSAL
  Filled 2018-06-22: qty 15

## 2018-06-22 MED ORDER — DEXAMETHASONE 4 MG PO TABS
10.0000 mg | ORAL_TABLET | Freq: Once | ORAL | Status: AC
  Administered 2018-06-22: 10 mg via ORAL
  Filled 2018-06-22: qty 2

## 2018-06-22 MED ORDER — LIDOCAINE VISCOUS HCL 2 % MT SOLN: 10.0000 mL | OROMUCOSAL | 0 refills | Status: DC | PRN

## 2018-06-22 MED ORDER — AMOXICILLIN-POT CLAVULANATE 875-125 MG PO TABS: 1.0000 | ORAL_TABLET | Freq: Two times a day (BID) | ORAL | 0 refills | Status: DC

## 2018-06-22 MED ORDER — PSEUDOEPHEDRINE HCL 30 MG PO TABS: 30.0000 mg | ORAL_TABLET | Freq: Four times a day (QID) | ORAL | 0 refills | Status: DC | PRN

## 2018-06-22 NOTE — Discharge Instructions (Addendum)
Take the medication as directed and follow up with your doctor. Return here for worsening symptoms. Take tylenol and ibuprofen as needed for pain. Use salt water gargles and Chloraseptic spray and the medications we give you.

## 2018-06-22 NOTE — ED Triage Notes (Signed)
She c/o gradually worsening sore throat. She is in no distress.

## 2018-06-22 NOTE — ED Provider Notes (Signed)
[facility] DEPT Provider Note   CSN: [id number] Arrival date & time: 06/22/18  1211     History   Chief Complaint Chief Complaint  Patient presents with  . Sore Throat    HPI Sheri Rivera is a 57 y.o. female who presents to the ED with c/o sore throat. The symptoms started 3 days ago and have gotten worse. Patient reports taking OTC medications without relief. She also c/o right ear pain and sinus congestion and pain.   The history is provided by the patient. No language interpreter was used.  Sore Throat  This is a new problem. The current episode started more than 2 days ago. The problem occurs constantly. The problem has been gradually worsening. Associated symptoms include headaches (sinus). Pertinent negatives include no chest pain and no abdominal pain. The symptoms are aggravated by swallowing. Nothing relieves the symptoms. She has tried acetaminophen for the symptoms. The treatment provided no relief.    Past Medical History:  Diagnosis Date  . Anxiety 11/11/11  . Arthritis   . Asthma   . Bursitis    right shoulder  . Chronic back pain   . Chronic neck pain   . Chronic shoulder pain   . COPD (chronic obstructive pulmonary disease) ([place])   . DDD (degenerative disc disease)   . Depression   . Fibromyalgia   . GERD (gastroesophageal reflux disease)   . Heart murmur    as a child per pt  . Herpes   . IBS (irritable bowel syndrome)   . Lumbar radiculopathy   . Seasonal allergies   . Thyroid nodule     Patient Active Problem List   Diagnosis Date Noted  . Acute respiratory failure with hypoxia ([place]) 08/03/2017  . GERD (gastroesophageal reflux disease) 06/13/2016  . Chronic obstructive pulmonary disease ([place]) 06/13/2016  . Current tobacco use 09/05/2015  . History of fracture of vertebra 02/16/2015  . Diffuse pain 04/03/2014  . Hemorrhage, postmenopausal 12/29/2013  . Chronic obstructive pulmonary disease with acute  exacerbation ([place]) 10/30/2013  . COPD mixed type ([place]) 03/30/2013  . Abdominal pain, epigastric 02/25/2013  . Dysphagia, unspecified(787.20) 02/25/2013  . Gas 02/25/2013  . Abdominal bloating 02/25/2013  . Precordial pain 02/16/2013  . Multinodular goiter 10/05/2012  . 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 Bladder cystocele 07/21/2012  . Clinical depression 11/11/2011  . Allergic rhinitis 11/11/2011  . URI (upper respiratory infection) 09/28/2011  . Sore throat 07/18/2011  . Neck swelling 06/22/2011  . Ingrown right big toenail 05/23/2011  . Chronic back pain 03/16/2011  . 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/05/2010  . Shortness of breath 03/15/2010  . GOITER, MULTINODULAR 07/01/2009  . FIBROMYALGIA 07/01/2009  . ALLERGIC RHINITIS CAUSE UNSPECIFIED 03/02/2008  . Anxiety 01/23/2007  . TOBACCO DEPENDENCE 01/23/2007  . GASTROESOPHAGEAL REFLUX, NO ESOPHAGITIS 01/23/2007    Past Surgical History:  Procedure Laterality Date  . CHOLECYSTECTOMY    . HERNIA REPAIR     [place]   . HIATAL HERNIA REPAIR    . TUBAL LIGATION  1983     OB History   None      Home Medications    Prior to Admission medications   Medication Sig Start Date End Date Taking? Authorizing Provider  albuterol (PROVENTIL HFA;VENTOLIN HFA) 108 (90 BASE) MCG/ACT inhaler Inhale 2 puffs into the lungs every 6 (six) hours as needed for shortness of breath. Shortness of breath    [provider]  albuterol (PROVENTIL) (2.5 MG/3ML) 0.083% nebulizer  solution Take 3 mLs (2.5 mg total) by nebulization every 4 (four) hours as needed for wheezing or shortness of breath. Shortness of breath 08/02/16   Molpus, Jenny Reichmann, MD  amoxicillin-clavulanate (AUGMENTIN) 875-125 MG tablet Take 1 tablet by mouth every 12 (twelve) hours. 06/22/18   Ashley Murrain, NP  ibuprofen (ADVIL,MOTRIN) 600 MG tablet Take 600 mg by mouth 2 (two) times daily. 10/29/13   [provider]  lidocaine (XYLOCAINE) 2 % solution Use as directed 10  mLs in the mouth or throat as needed for mouth pain. 06/22/18   Ashley Murrain, NP  LORazepam (ATIVAN) 1 MG tablet Take 0.5 mg by mouth every 8 (eight) hours as needed for anxiety.  03/16/11   [place], Modena Nunnery, MD  methocarbamol (ROBAXIN) 500 MG tablet Take 1 tablet (500 mg total) by mouth 2 (two) times daily as needed for muscle spasms. 04/15/18   Clifton James, MD  pseudoephedrine (SUDAFED) 30 MG tablet Take 1 tablet (30 mg total) by mouth every 6 (six) hours as needed for congestion. 06/22/18   Ashley Murrain, NP  simethicone (GAS-X) 80 MG chewable tablet Chew 80 mg by mouth every 6 (six) hours as needed for flatulence.    [provider]    Family History Family History  Problem Relation Age of Onset  . Lung cancer Mother   . Arthritis Mother        Rheumatoid  . Bursitis Mother   . Asthma Mother   . Heart attack Father 70       Died of MI  . Obesity Father   . Fibromyalgia Sister   . Asthma Child     Social History Social History   Tobacco Use  . Smoking status: Current Every Day Smoker    Packs/day: 0.25    Years: 27.00    Pack years: 6.75    Types: Cigarettes  . Smokeless tobacco: Never Used  Substance Use Topics  . Alcohol use: No  . Drug use: No     Allergies   Clindamycin/lincomycin; Cymbalta [duloxetine hcl]; Levaquin [levofloxacin in d5w]; Lyrica [pregabalin]; Shellfish allergy; Sulfamethoxazole; and Codeine   Review of Systems Review of Systems  Constitutional: Positive for chills.  HENT: Positive for ear pain, sinus pressure, sinus pain and sore throat.   Eyes: Negative for pain, discharge and itching.  Respiratory: Cough: due to COPD.   Cardiovascular: Negative for chest pain.  Gastrointestinal: Negative for abdominal pain, nausea and vomiting.  Musculoskeletal: Positive for myalgias.  Skin: Negative for rash.  Neurological: Positive for headaches (sinus). Negative for syncope.  Psychiatric/Behavioral: Negative for confusion.      Physical Exam Updated Vital Signs BP 122/89 (BP Location: Right Arm)   Pulse 69   Temp 97.9 F (36.6 C) (Oral)   Resp 18   Ht 5\' 3"  (1.6 m)   Wt 78.9 kg (174 lb)   SpO2 95%   BMI 30.82 kg/m   Physical Exam  Constitutional: She appears well-developed and well-nourished. No distress.  HENT:  Head: Normocephalic.  Right Ear: Tympanic membrane is injected.  Left Ear: Tympanic membrane normal.  Nose: Mucosal edema and rhinorrhea present. Right sinus exhibits maxillary sinus tenderness. Left sinus exhibits maxillary sinus tenderness.  Mouth/Throat: Uvula is midline and mucous membranes are normal. Posterior oropharyngeal erythema present. No posterior oropharyngeal edema.  Eyes: Pupils are equal, round, and reactive to light. Conjunctivae and EOM are normal.  Neck: Neck supple.  Cardiovascular: Normal rate.  Pulmonary/Chest: Effort normal.  Musculoskeletal:  Normal range of motion.  Lymphadenopathy:    She has cervical adenopathy.  Neurological: She is alert.  Skin: Skin is warm and dry.  Psychiatric: She has a normal mood and affect. Her behavior is normal.  Nursing note and vitals reviewed.    ED Treatments / Results  Labs (all labs ordered are listed, but only abnormal results are displayed) Labs Reviewed  GROUP A STREP BY PCR    Radiology Dg Neck Soft Tissue  Result Date: 06/22/2018 CLINICAL DATA:  Gradually worsening sore throat with difficulty swallowing. EXAM: NECK SOFT TISSUES - 1+ VIEW COMPARISON:  Cervical MRI 03/12/2018 FINDINGS: There is no evidence of retropharyngeal soft tissue swelling or epiglottic enlargement. The cervical airway is unremarkable. Carotid calcification on the left-no radio-opaque foreign body identified. C2-3 non segmentation. Interstitial coarsening at the apices, chronic when compared to prior chest x-ray. Patient has history of COPD. Thoracic scoliosis. IMPRESSION: Negative cervical soft tissues. Electronically Signed   By: Monte Fantasia M.D.   On: 06/22/2018 14:47    Procedures Procedures (including critical care time)  Medications Ordered in ED Medications  lidocaine (XYLOCAINE) 2 % viscous mouth solution 15 mL (15 mLs Mouth/Throat Given 06/22/18 1318)  dexamethasone (DECADRON) tablet 10 mg (10 mg Oral Given 06/22/18 1440)     Initial Impression / Assessment and Plan / ED Course  I have reviewed the triage vital signs and the nursing notes. 57 y.o. female with sore throat, right ear pain and sinus pain stable for d/c with normal soft tissue neck and taking PO fluids prior to d/c. Discussed with the patient lab and x-ray findings and plan of care. Patient agrees with plan. Return precautions discussed. Dr. Sedonia Small saw this patient with me.  Final Clinical Impressions(s) / ED Diagnoses   Final diagnoses:  Acute pharyngitis, unspecified etiology  Acute maxillary sinusitis, recurrence not specified  Right acute serous otitis media, recurrence not specified    ED Discharge Orders        Ordered    amoxicillin-clavulanate (AUGMENTIN) 875-125 MG tablet  Every 12 hours     06/22/18 1454    pseudoephedrine (SUDAFED) 30 MG tablet  Every 6 hours PRN     06/22/18 1454    lidocaine (XYLOCAINE) 2 % solution  As needed     06/22/18 1454       Janit Bern [place], NP 06/22/18 1505    Maudie Flakes, MD 06/22/18 1622

## 2018-06-29 ENCOUNTER — Other Ambulatory Visit: Payer: Self-pay

## 2018-06-29 ENCOUNTER — Emergency Department (HOSPITAL_COMMUNITY)
Admission: EM | Admit: 2018-06-29 | Discharge: 2018-06-29 | Disposition: A | Discharge status: Home / Self Care | Payer: Medicaid Other | Attending: Emergency Medicine | Admitting: Emergency Medicine

## 2018-06-29 ENCOUNTER — Emergency Department (HOSPITAL_COMMUNITY): Payer: Medicaid Other

## 2018-06-29 ENCOUNTER — Encounter (HOSPITAL_COMMUNITY): Payer: Self-pay | Admitting: *Deleted

## 2018-06-29 DIAGNOSIS — J449 Chronic obstructive pulmonary disease, unspecified: Secondary | ICD-10-CM | POA: Insufficient documentation

## 2018-06-29 DIAGNOSIS — J392 Other diseases of pharynx: Secondary | ICD-10-CM

## 2018-06-29 DIAGNOSIS — R1310 Dysphagia, unspecified: Secondary | ICD-10-CM | POA: Diagnosis not present

## 2018-06-29 DIAGNOSIS — R221 Localized swelling, mass and lump, neck: Secondary | ICD-10-CM | POA: Diagnosis not present

## 2018-06-29 DIAGNOSIS — J029 Acute pharyngitis, unspecified: Secondary | ICD-10-CM | POA: Diagnosis present

## 2018-06-29 LAB — BASIC METABOLIC PANEL
Anion gap: 10 (ref 5–15)
BUN: 7 mg/dL (ref 6–20)
CO2: 25 mmol/L (ref 22–32)
Calcium: 9.4 mg/dL (ref 8.9–10.3)
Chloride: 107 mmol/L (ref 98–111)
Creatinine, Ser: 0.43 mg/dL — ABNORMAL LOW (ref 0.44–1.00)
GFR calc Af Amer: >60 mL/min (ref >60)
GFR calc non Af Amer: >60 mL/min (ref >60)
Glucose, Bld: 99 mg/dL (ref 70–99)
Potassium: 3.8 mmol/L (ref 3.5–5.1)
Sodium: 142 mmol/L (ref 135–145)

## 2018-06-29 LAB — CBC WITH DIFFERENTIAL/PLATELET
Basophils Absolute: 0 10*3/uL (ref 0.0–0.1)
Basophils Relative: 0 %
Eosinophils Absolute: 0.1 10*3/uL (ref 0.0–0.7)
Eosinophils Relative: 1 %
HCT: 48.3 % — ABNORMAL HIGH (ref 36.0–46.0)
Hemoglobin: 16.2 g/dL — ABNORMAL HIGH (ref 12.0–15.0)
Lymphocytes Relative: 30 %
Lymphs Abs: 2.7 10*3/uL (ref 0.7–4.0)
MCH: 31.6 pg (ref 26.0–34.0)
MCHC: 33.5 g/dL (ref 30.0–36.0)
MCV: 94.2 fL (ref 78.0–100.0)
Monocytes Absolute: 0.6 10*3/uL (ref 0.1–1.0)
Monocytes Relative: 6 %
Neutro Abs: 5.6 10*3/uL (ref 1.7–7.7)
Neutrophils Relative %: 63 %
Platelets: 272 10*3/uL (ref 150–400)
RBC: 5.13 MIL/uL — ABNORMAL HIGH (ref 3.87–5.11)
RDW: 14.1 % (ref 11.5–15.5)
WBC: 9 10*3/uL (ref 4.0–10.5)

## 2018-06-29 MED ORDER — OXYMETAZOLINE HCL 0.05 % NA SOLN
1.0000 | Freq: Once | NASAL | Status: AC
Start: 2018-06-29 — End: 2018-06-29
  Administered 2018-06-29: 1 via NASAL
  Filled 2018-06-29: qty 15

## 2018-06-29 MED ORDER — MORPHINE SULFATE (PF) 4 MG/ML IV SOLN
6.0000 mg | Freq: Once | INTRAVENOUS | Status: DC
  Filled 2018-06-29: qty 2

## 2018-06-29 MED ORDER — HYDROCODONE-ACETAMINOPHEN 7.5-325 MG/15ML PO SOLN
10.0000 mL | Freq: Once | ORAL | Status: AC
  Administered 2018-06-29: 5 mL via ORAL
  Filled 2018-06-29: qty 15

## 2018-06-29 MED ORDER — ONDANSETRON HCL 4 MG/2ML IJ SOLN
4.0000 mg | Freq: Once | INTRAMUSCULAR | Status: DC
  Filled 2018-06-29: qty 2

## 2018-06-29 MED ORDER — IOPAMIDOL (ISOVUE-300) INJECTION 61%
80.0000 mL | Freq: Once | INTRAVENOUS | Status: AC | PRN
  Administered 2018-06-29: 80 mL via INTRAVENOUS

## 2018-06-29 MED ORDER — HYDROCODONE-ACETAMINOPHEN 7.5-325 MG/15ML PO SOLN: 10.0000 mL | Freq: Four times a day (QID) | ORAL | 0 refills | Status: DC | PRN

## 2018-06-29 MED ORDER — DEXAMETHASONE SODIUM PHOSPHATE 10 MG/ML IJ SOLN
10.0000 mg | Freq: Once | INTRAMUSCULAR | Status: AC
Start: 2018-06-29 — End: 2018-06-29
  Administered 2018-06-29: 10 mg via INTRAVENOUS
  Filled 2018-06-29: qty 1

## 2018-06-29 MED ORDER — ONDANSETRON 4 MG PO TBDP: 4.0000 mg | ORAL_TABLET | Freq: Three times a day (TID) | ORAL | 0 refills | Status: DC | PRN

## 2018-06-29 MED ORDER — METHYLPREDNISOLONE 4 MG PO TBPK: ORAL_TABLET | ORAL | 0 refills | Status: DC

## 2018-06-29 MED ORDER — AMOXICILLIN-POT CLAVULANATE 875-125 MG PO TABS: 1.0000 | ORAL_TABLET | Freq: Two times a day (BID) | ORAL | 0 refills | Status: DC

## 2018-06-29 MED ORDER — LIDOCAINE VISCOUS HCL 2 % MT SOLN
15.0000 mL | Freq: Once | OROMUCOSAL | Status: AC
  Administered 2018-06-29: 15 mL via OROMUCOSAL
  Filled 2018-06-29: qty 15

## 2018-06-29 MED ORDER — MORPHINE SULFATE (PF) 4 MG/ML IV SOLN
4.0000 mg | Freq: Once | INTRAVENOUS | Status: DC
  Filled 2018-06-29: qty 1

## 2018-06-29 MED ORDER — SODIUM CHLORIDE 0.9 % IV BOLUS
1000.0000 mL | Freq: Once | INTRAVENOUS | Status: AC
  Administered 2018-06-29: 1000 mL via INTRAVENOUS

## 2018-06-29 MED ORDER — KETOROLAC TROMETHAMINE 15 MG/ML IJ SOLN
15.0000 mg | Freq: Once | INTRAMUSCULAR | Status: AC
  Administered 2018-06-29: 15 mg via INTRAVENOUS
  Filled 2018-06-29: qty 1

## 2018-06-29 MED ORDER — IOPAMIDOL (ISOVUE-300) INJECTION 61%
INTRAVENOUS | Status: AC
  Filled 2018-06-29: qty 100

## 2018-06-29 NOTE — Discharge Instructions (Addendum)
As we discussed, your CT findings are concerning for possible cancer or other mass.  It is very important that you follow-up with Dr. Ernesto Rutherford early this week.  I discussed your case with Dr. Lucia Gaskins today, and he can see you as well as Dr. Claria Dice is unable to see you this week.  Continue taking the antibiotic and we have prescribed a new steroid.  Take the pain medication as needed.  It may help to sleep on your right side.  If you develop worsening difficulty breathing, complete inability to swallow, or other concerning symptoms, return to the ER immediately.

## 2018-06-29 NOTE — ED Provider Notes (Signed)
[facility] DEPT Provider Note   CSN: [id number] Arrival date & time: 06/29/18  1519     History   Chief Complaint Chief Complaint  Patient presents with  . Throat Problems    HPI Sheri Rivera is a 57 y.o. female.  HPI   57 year old female with past medical history as below here with ongoing neck pain and difficulty swallowing.  Patient has had ongoing right-sided ear pain for the last several months.  She has seen an ENT for this.  Over the last week or 2, she has had worsening right ear pain, shortness of breath, and sensation of her throat swelling.  She was seen and had a negative plain film of her throat several days ago, and has been on antibiotics since then.  She states this has not improved her symptoms.  She said progressive worsening sharp, stabbing, burning pain in her throat that is severe with swallowing.  She feels like food is getting stuck on the right side of her lower throat.  She said associated shortness of breath, and sensation that she cannot catch her breath.  Denies any fevers.  She is had persistent right ear pressure.  She is status multiple inflamed lymph nodes on her neck.  No headache.  She is able to tolerate very small amounts of hot liquid, but otherwise cannot eat or drink.  No relieving factors.  Past Medical History:  Diagnosis Date  . Anxiety 11/11/11  . Arthritis   . Asthma   . Bursitis    right shoulder  . Chronic back pain   . Chronic neck pain   . Chronic shoulder pain   . COPD (chronic obstructive pulmonary disease) ([place])   . DDD (degenerative disc disease)   . Depression   . Fibromyalgia   . GERD (gastroesophageal reflux disease)   . Heart murmur    as a child per pt  . Herpes   . IBS (irritable bowel syndrome)   . Lumbar radiculopathy   . Seasonal allergies   . Thyroid nodule     Patient Active Problem List   Diagnosis Date Noted  . Acute respiratory failure with hypoxia ([place]) 08/03/2017  .  GERD (gastroesophageal reflux disease) 06/13/2016  . Chronic obstructive pulmonary disease ([place]) 06/13/2016  . Current tobacco use 09/05/2015  . History of fracture of vertebra 02/16/2015  . Diffuse pain 04/03/2014  . Hemorrhage, postmenopausal 12/29/2013  . Chronic obstructive pulmonary disease with acute exacerbation ([place]) 10/30/2013  . COPD mixed type ([place]) 03/30/2013  . Abdominal pain, epigastric 02/25/2013  . Dysphagia, unspecified(787.20) 02/25/2013  . Gas 02/25/2013  . Abdominal bloating 02/25/2013  . Precordial pain 02/16/2013  . Multinodular goiter 10/05/2012  . 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 Bladder cystocele 07/21/2012  . Clinical depression 11/11/2011  . Allergic rhinitis 11/11/2011  . URI (upper respiratory infection) 09/28/2011  . Sore throat 07/18/2011  . Neck swelling 06/22/2011  . Ingrown right big toenail 05/23/2011  . Chronic back pain 03/16/2011  . 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/05/2010  . Shortness of breath 03/15/2010  . GOITER, MULTINODULAR 07/01/2009  . FIBROMYALGIA 07/01/2009  . ALLERGIC RHINITIS CAUSE UNSPECIFIED 03/02/2008  . Anxiety 01/23/2007  . TOBACCO DEPENDENCE 01/23/2007  . GASTROESOPHAGEAL REFLUX, NO ESOPHAGITIS 01/23/2007    Past Surgical History:  Procedure Laterality Date  . CHOLECYSTECTOMY    . HERNIA REPAIR     [facility]   . HIATAL HERNIA REPAIR    . [place]  OB History   None      Home Medications    Prior to Admission medications   Medication Sig Start Date End Date Taking? Authorizing Provider  albuterol (PROVENTIL HFA;VENTOLIN HFA) 108 (90 BASE) MCG/ACT inhaler Inhale 2 puffs into the lungs every 6 (six) hours as needed for shortness of breath. Shortness of breath   Yes [provider]  albuterol (PROVENTIL) (2.5 MG/3ML) 0.083% nebulizer solution Take 3 mLs (2.5 mg total) by nebulization every 4 (four) hours as needed for wheezing or shortness of breath. Shortness of breath 08/02/16  Yes  Molpus, John, MD  fexofenadine-pseudoephedrine (ALLEGRA-D 24) 180-240 MG 24 hr tablet Take 1 tablet by mouth daily.   Yes [provider]  ibuprofen (ADVIL,MOTRIN) 600 MG tablet Take 600 mg by mouth 2 (two) times daily. 10/29/13  Yes [provider]  LORazepam (ATIVAN) 1 MG tablet Take 0.5 mg by mouth every 8 (eight) hours as needed for anxiety.  03/16/11  Yes Perry, Modena Nunnery, MD  pseudoephedrine (SUDAFED) 30 MG tablet Take 1 tablet (30 mg total) by mouth every 6 (six) hours as needed for congestion. 06/22/18  Yes Neese, [place], NP  simethicone (GAS-X) 80 MG chewable tablet Chew 160 mg by mouth daily.    Yes [provider]  amoxicillin-clavulanate (AUGMENTIN) 875-125 MG tablet Take 1 tablet by mouth every 12 (twelve) hours. 06/29/18   Duffy Bruce, MD  HYDROcodone-acetaminophen (HYCET) 7.5-325 mg/15 ml solution Take 10 mLs by mouth every 6 (six) hours as needed for severe pain. 06/29/18 06/29/19  Duffy Bruce, MD  lidocaine (XYLOCAINE) 2 % solution Use as directed 10 mLs in the mouth or throat as needed for mouth pain. Patient not taking: Reported on 06/29/2018 06/22/18   Ashley Murrain, NP  methocarbamol (ROBAXIN) 500 MG tablet Take 1 tablet (500 mg total) by mouth 2 (two) times daily as needed for muscle spasms. Patient not taking: Reported on 06/29/2018 04/15/18   Clifton James, MD  methylPREDNISolone (MEDROL DOSEPAK) 4 MG TBPK tablet Take as directed on packaging 06/29/18   Duffy Bruce, MD  ondansetron (ZOFRAN ODT) 4 MG disintegrating tablet Take 1 tablet (4 mg total) by mouth every 8 (eight) hours as needed for nausea or vomiting. 06/29/18   Duffy Bruce, MD    Family History Family History  Problem Relation Age of Onset  . Lung cancer Mother   . Arthritis Mother        Rheumatoid  . Bursitis Mother   . Asthma Mother   . Heart attack Father 83       Died of MI  . Obesity Father   . Fibromyalgia Sister   . Asthma Child     Social History Social  History   Tobacco Use  . Smoking status: Current Every Day Smoker    Packs/day: 0.25    Years: 27.00    Pack years: 6.75    Types: Cigarettes  . Smokeless tobacco: Never Used  Substance Use Topics  . Alcohol use: No  . Drug use: No     Allergies   Clindamycin/lincomycin; Cymbalta [duloxetine hcl]; Levaquin [levofloxacin in d5w]; Lyrica [pregabalin]; Shellfish allergy; Sulfamethoxazole; and Codeine   Review of Systems Review of Systems  Constitutional: Positive for fatigue. Negative for chills and fever.  HENT: Positive for ear pain, sinus pressure, sore throat and trouble swallowing. Negative for congestion and rhinorrhea.   Eyes: Negative for visual disturbance.  Respiratory: Negative for cough, shortness of breath and wheezing.   Cardiovascular:  Negative for chest pain and leg swelling.  Gastrointestinal: Negative for abdominal pain, diarrhea, nausea and vomiting.  Genitourinary: Negative for dysuria and flank pain.  Musculoskeletal: Negative for neck pain and neck stiffness.  Skin: Negative for rash and wound.  Allergic/Immunologic: Negative for immunocompromised state.  Neurological: Negative for syncope, weakness and headaches.  All other systems reviewed and are negative.    Physical Exam Updated Vital Signs BP 129/87   Pulse 79   Temp 97.6 F (36.4 C) (Oral)   Resp 18   Ht 5\' 3"  (1.6 m)   Wt 78.9 kg (174 lb)   SpO2 95%   BMI 30.82 kg/m   Physical Exam  Constitutional: She is oriented to person, place, and time. She appears well-developed and well-nourished. No distress.  HENT:  Head: Normocephalic and atraumatic.  Mild serous effusions bilateral tympanic membranes.  Soft, mobile, but tender cervical lymphadenopathy in the anterior chains bilaterally.  No submandibular or sublingual edema or induration.  No pooling of secretions.  Oropharynx widely patent.  Eyes: Conjunctivae are normal.  Neck: Neck supple. No JVD present. No tracheal deviation present.    Cardiovascular: Normal rate, regular rhythm and normal heart sounds. Exam reveals no friction rub.  No murmur heard. Pulmonary/Chest: Effort normal and breath sounds normal. No respiratory distress. She has no wheezes. She has no rales.  Abdominal: She exhibits no distension.  Musculoskeletal: She exhibits no edema.  Neurological: She is alert and oriented to person, place, and time. She exhibits normal muscle tone.  Skin: Skin is warm. Capillary refill takes less than 2 seconds.  Psychiatric: She has a normal mood and affect.  Nursing note and vitals reviewed.    ED Treatments / Results  Labs (all labs ordered are listed, but only abnormal results are displayed) Labs Reviewed  CBC WITH DIFFERENTIAL/PLATELET - Abnormal; Notable for the following components:      Result Value   RBC 5.13 (*)    Hemoglobin 16.2 (*)    HCT 48.3 (*)    All other components within normal limits  BASIC METABOLIC PANEL - Abnormal; Notable for the following components:   Creatinine, Ser 0.43 (*)    All other components within normal limits    EKG None  Radiology Ct Soft Tissue Neck W Contrast  Result Date: 06/29/2018 CLINICAL DATA:  Pain in the right ear and tonsillar region. Difficulty swallowing. EXAM: CT NECK WITH CONTRAST TECHNIQUE: Multidetector CT imaging of the neck was performed using the standard protocol following the bolus administration of intravenous contrast. CONTRAST:  25mL ISOVUE-300 IOPAMIDOL (ISOVUE-300) INJECTION 61% COMPARISON:  Temporal bone CT 05/26/2018 FINDINGS: Pharynx and larynx: Hypopharyngeal mass in the piriform sinus region on the right measuring up to 1.9 cm in size, quite worrisome for a carcinoma. No other mucosal or submucosal lesion is seen. Salivary glands: Parotid and submandibular glands are normal. Thyroid: Few small thyroid nodules or cysts, not significant by CT. Lymph nodes: No enlarged or low-density nodes on either side of the neck. Vascular: Ordinary mild  atherosclerotic change of the carotid bifurcations. No venous abnormality. 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 Facet arthropathy on the left at C3-4. Cervicothoracic scoliotic curvature. Upper chest: Scarring and emphysema. 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 No evidence of adenopathy. Electronically Signed   By: Nelson Chimes M.D.   On: 06/29/2018 19:48    Procedures Procedures (including critical  care time)  Medications Ordered in ED Medications  iopamidol (ISOVUE-300) 61 % injection (has no administration in time range)  dexamethasone (DECADRON) injection 10 mg (10 mg Intravenous Given 06/29/18 1740)  lidocaine (XYLOCAINE) 2 % viscous mouth solution 15 mL (15 mLs Mouth/Throat Given 06/29/18 1722)  oxymetazoline (AFRIN) 0.05 % nasal spray 1 spray (1 spray Each Nare Given 06/29/18 1723)  iopamidol (ISOVUE-300) 61 % injection 80 mL (80 mLs Intravenous Contrast Given 06/29/18 1858)  sodium chloride 0.9 % bolus 1,000 mL (0 mLs Intravenous Stopped 06/29/18 1943)  ketorolac (TORADOL) 15 MG/ML injection 15 mg (15 mg Intravenous Given 06/29/18 1923)  HYDROcodone-acetaminophen (HYCET) 7.5-325 mg/15 ml solution 10 mL (5 mLs Oral Given 06/29/18 2027)     Initial Impression / Assessment and Plan / ED Course  I have reviewed the triage vital signs and the nursing notes.  Pertinent labs & imaging results that were available during my care of the patient were reviewed by me and considered in my medical decision making (see chart for details).  Clinical Course as of Jun 29 2036  Nancy Fetter Jun 29, 5509  [id number] 57 year old female here with ongoing sore throat, neck pain, and right ear pain.  Patient has serous effusions on exam but no evidence of otitis media, externa, or mastoiditis.  Regarding her throat swelling, this is primarily subjective and I see no  appreciable swelling, induration, or evidence of Ludwig's or neck infection clinically.  Will check a CT scan.  Will give a dose of steroids for inflammatory component, in addition to analgesics.   [CI]  2002 Unfortunately, CT is c/f 1.9 cm R piriformis sinus mass. Given her h/o smoking, most likely carcinoma. Pt feels better in ED, is tolerating liquids, but will place call to Dr. Ernesto Rutherford re: findings, to ensure appropriate follow-up/care.   [CI]  2036 I discussed the case with Dr. Lucia Gaskins who is on-call for Dr. Ernesto Rutherford.  He will see the patient early this week.  He recommends Augmentin, Decadron, and discharge.  Patient is tolerating sips of liquids and is otherwise well-appearing.  No signs of impending airway compromise at this time.  Discussed strict return precautions and will discharge home.   [CI]    Clinical Course User Index [CI] Duffy Bruce, MD     Final Clinical Impressions(s) / ED Diagnoses   Final diagnoses:  Throat mass  Odynophagia    ED Discharge Orders        Ordered    amoxicillin-clavulanate (AUGMENTIN) 875-125 MG tablet  Every 12 hours     06/29/18 2029    methylPREDNISolone (MEDROL DOSEPAK) 4 MG TBPK tablet     06/29/18 2029    HYDROcodone-acetaminophen (HYCET) 7.5-325 mg/15 ml solution  Every 6 hours PRN     06/29/18 2029    ondansetron (ZOFRAN ODT) 4 MG disintegrating tablet  Every 8 hours PRN     06/29/18 2029       Duffy Bruce, MD 06/29/18 2037

## 2018-06-29 NOTE — ED Triage Notes (Signed)
Pt seen last week for throat feeling as if it is closing up, started on antibiotics, continues to worsen with pain in rt ear and tonsil. Difficult to get food past throat area

## 2018-07-03 DIAGNOSIS — C140 Malignant neoplasm of pharynx, unspecified: Secondary | ICD-10-CM | POA: Diagnosis not present

## 2018-07-07 ENCOUNTER — Encounter (HOSPITAL_BASED_OUTPATIENT_CLINIC_OR_DEPARTMENT_OTHER): Payer: Self-pay | Admitting: *Deleted

## 2018-07-07 ENCOUNTER — Other Ambulatory Visit: Payer: Self-pay

## 2018-07-08 ENCOUNTER — Ambulatory Visit: Payer: Self-pay | Admitting: Otolaryngology

## 2018-07-08 NOTE — H&P (Signed)
PREOPERATIVE H&P  Chief Complaint: right ear pain and right throat pain  HPI: Sheri Rivera is a 57 y.o. 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 On exam in the office patient has a mass along the right AE fold. This is nonobstructing. Vocal cords are clear with normal mobility. She is taken to the operating room at this time for direct laryngoscopy and biopsy.  Past Medical History:  Diagnosis Date  . Anxiety 11/11/11  . Arthritis   . Asthma   . Bursitis    right shoulder  . Chronic back pain   . Chronic neck pain   . Chronic shoulder pain   . COPD (chronic obstructive pulmonary disease) ([place])    continues to smoke 1ppd  . DDD (degenerative disc disease)   . Depression   . Fibromyalgia   . GERD (gastroesophageal reflux disease)   . Heart murmur    as a child per pt  . Herpes   . IBS (irritable bowel syndrome)   . Lumbar radiculopathy   . Seasonal allergies   . Smoker   . Thyroid nodule    Past Surgical History:  Procedure Laterality Date  . CHOLECYSTECTOMY    . HERNIA REPAIR     [place]   . HIATAL HERNIA REPAIR    . TUBAL LIGATION  1983   Social History   Socioeconomic History  . Marital status: Divorced    Spouse name: Not on file  . Number of children: 3  . Years of education: Not on file  . Highest education level: Not on file  Occupational History    Employer: [employer]  Social Needs  . Financial resource strain: Not on file  . Food insecurity:    Worry: Not on file    Inability: Not on file  . Transportation needs:    Medical: Not on file    Non-medical: Not on file  Tobacco Use  . Smoking status: Current Every Day Smoker    Packs/day: 1.00    Years: 27.00    Pack years: 27.00    Types: Cigarettes  . Smokeless tobacco: Never Used  Substance and Sexual Activity  . Alcohol use: No   . Drug use: No  . Sexual activity: Not on file  Lifestyle  . Physical activity:    Days per week: Not on file    Minutes per session: Not on file  . Stress: Not on file  Relationships  . Social connections:    Talks on phone: Not on file    Gets together: Not on file    Attends religious service: Not on file    Active member of club or organization: Not on file    Attends meetings of clubs or organizations: Not on file    Relationship status: Not on file  Other Topics Concern  . Not on file  Social History Narrative   57 year old daughter lives with her.    Family History  Problem Relation Age of Onset  . Lung cancer Mother   . Arthritis Mother        Rheumatoid  . Bursitis Mother   . Asthma Mother   . Heart attack Father 5       Died of MI  . Obesity Father   . Fibromyalgia Sister   . Asthma Child  Allergies  Allergen Reactions  . Clindamycin/Lincomycin Anaphylaxis and Swelling  . Cymbalta [Duloxetine Hcl] Anaphylaxis and Itching    Lips and throat swelled; stopped breathing  . Levaquin [Levofloxacin In D5w] Anaphylaxis and Itching  . Lyrica [Pregabalin] Shortness Of Breath and Swelling    Lips and throat swelled  . 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 Sulfamethoxazole Anaphylaxis  . Codeine Anxiety    Sweating,nervous   Prior to Admission medications   Medication Sig Start Date End Date Taking? Authorizing Provider  albuterol (PROVENTIL HFA;VENTOLIN HFA) 108 (90 BASE) MCG/ACT inhaler Inhale 2 puffs into the lungs every 6 (six) hours as needed for shortness of breath. Shortness of breath   Yes [provider]  albuterol (PROVENTIL) (2.5 MG/3ML) 0.083% nebulizer solution Take 3 mLs (2.5 mg total) by nebulization every 4 (four) hours as needed for wheezing or shortness of breath. Shortness of breath 08/02/16  Yes Molpus, John, MD  amoxicillin-clavulanate (AUGMENTIN) 875-125 MG tablet Take 1 tablet by mouth every 12 (twelve) hours. 06/29/18   Yes Duffy Bruce, MD  fexofenadine-pseudoephedrine (ALLEGRA-D 24) 180-240 MG 24 hr tablet Take 1 tablet by mouth daily.   Yes [provider]  HYDROcodone-acetaminophen (HYCET) 7.5-325 mg/15 ml solution Take 10 mLs by mouth every 6 (six) hours as needed for severe pain. 06/29/18 06/29/19 Yes Duffy Bruce, MD  ibuprofen (ADVIL,MOTRIN) 600 MG tablet Take 600 mg by mouth 2 (two) times daily. 10/29/13  Yes [provider]  LORazepam (ATIVAN) 1 MG tablet Take 0.5 mg by mouth every 8 (eight) hours as needed for anxiety.  03/16/11  Yes [place], Modena Nunnery, MD  methylPREDNISolone (MEDROL DOSEPAK) 4 MG TBPK tablet Take as directed on packaging 06/29/18  Yes Duffy Bruce, MD  pseudoephedrine (SUDAFED) 30 MG tablet Take 1 tablet (30 mg total) by mouth every 6 (six) hours as needed for congestion. 06/22/18  Yes Neese, [place], NP  simethicone (GAS-X) 80 MG chewable tablet Chew 160 mg by mouth daily.    Yes [provider]     Positive ROS: negative except for right ear pain and throat discomfort.  All other systems have been reviewed and were otherwise negative with the exception of those mentioned in the HPI and as above.  Physical Exam: There were no vitals filed for this visit.  General: Alert, no acute distress Oral: Normal oral mucosa and tonsils. Indirect laryngoscopy revealed a mass along the right AE fold. Fiberoptic laryngoscopy again revealed a right AE fold mass obstructing the right piriform sinus.clear vocal cords Nasal: Clear nasal passages Neck: No palpable adenopathy or thyroid nodules Ear: Ear canal is clear with normal appearing TMs Cardiovascular: Regular rate and rhythm, no murmur.  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

## 2018-07-11 ENCOUNTER — Ambulatory Visit (HOSPITAL_BASED_OUTPATIENT_CLINIC_OR_DEPARTMENT_OTHER): Payer: Medicaid Other | Admitting: Anesthesiology

## 2018-07-11 ENCOUNTER — Other Ambulatory Visit (HOSPITAL_COMMUNITY): Payer: Self-pay | Admitting: Otolaryngology

## 2018-07-11 ENCOUNTER — Ambulatory Visit (HOSPITAL_BASED_OUTPATIENT_CLINIC_OR_DEPARTMENT_OTHER)
Admission: RE | Admit: 2018-07-11 | Discharge: 2018-07-11 | Disposition: A | Discharge status: Home / Self Care | Payer: Medicaid Other | Source: Ambulatory Visit | Attending: Otolaryngology | Admitting: Otolaryngology

## 2018-07-11 ENCOUNTER — Encounter (HOSPITAL_BASED_OUTPATIENT_CLINIC_OR_DEPARTMENT_OTHER): Payer: Self-pay | Admitting: Anesthesiology

## 2018-07-11 ENCOUNTER — Other Ambulatory Visit: Payer: Self-pay

## 2018-07-11 ENCOUNTER — Encounter (HOSPITAL_BASED_OUTPATIENT_CLINIC_OR_DEPARTMENT_OTHER)
Admission: RE | Disposition: A | Discharge status: Home / Self Care | Payer: Self-pay | Source: Ambulatory Visit | Attending: Otolaryngology

## 2018-07-11 DIAGNOSIS — D487 Neoplasm of uncertain behavior of other specified sites: Secondary | ICD-10-CM | POA: Diagnosis not present

## 2018-07-11 DIAGNOSIS — D380 Neoplasm of uncertain behavior of larynx: Secondary | ICD-10-CM | POA: Diagnosis present

## 2018-07-11 DIAGNOSIS — F419 Anxiety disorder, unspecified: Secondary | ICD-10-CM | POA: Insufficient documentation

## 2018-07-11 DIAGNOSIS — C321 Malignant neoplasm of supraglottis: Secondary | ICD-10-CM

## 2018-07-11 DIAGNOSIS — J449 Chronic obstructive pulmonary disease, unspecified: Secondary | ICD-10-CM | POA: Insufficient documentation

## 2018-07-11 DIAGNOSIS — C131 Malignant neoplasm of aryepiglottic fold, hypopharyngeal aspect: Secondary | ICD-10-CM | POA: Insufficient documentation

## 2018-07-11 DIAGNOSIS — Z791 Long term (current) use of non-steroidal anti-inflammatories (NSAID): Secondary | ICD-10-CM | POA: Diagnosis not present

## 2018-07-11 DIAGNOSIS — K219 Gastro-esophageal reflux disease without esophagitis: Secondary | ICD-10-CM | POA: Diagnosis not present

## 2018-07-11 DIAGNOSIS — F17210 Nicotine dependence, cigarettes, uncomplicated: Secondary | ICD-10-CM | POA: Diagnosis not present

## 2018-07-11 DIAGNOSIS — J441 Chronic obstructive pulmonary disease with (acute) exacerbation: Secondary | ICD-10-CM | POA: Diagnosis not present

## 2018-07-11 DIAGNOSIS — J9601 Acute respiratory failure with hypoxia: Secondary | ICD-10-CM | POA: Diagnosis not present

## 2018-07-11 HISTORY — PX: DIRECT LARYNGOSCOPY: SHX5326

## 2018-07-11 HISTORY — DX: Nicotine dependence, unspecified, uncomplicated: F17.200

## 2018-07-11 SURGERY — LARYNGOSCOPY, DIRECT
Anesthesia: General | Site: Throat

## 2018-07-11 MED ORDER — EPINEPHRINE PF 1 MG/ML IJ SOLN
INTRAMUSCULAR | Status: DC | PRN
  Administered 2018-07-11: 1 mg

## 2018-07-11 MED ORDER — ROCURONIUM BROMIDE 50 MG/5ML IV SOSY
PREFILLED_SYRINGE | INTRAVENOUS | Status: AC
  Filled 2018-07-11: qty 5

## 2018-07-11 MED ORDER — SUGAMMADEX SODIUM 500 MG/5ML IV SOLN
INTRAVENOUS | Status: AC
  Filled 2018-07-11: qty 5

## 2018-07-11 MED ORDER — PROPOFOL 10 MG/ML IV BOLUS
INTRAVENOUS | Status: DC | PRN
  Administered 2018-07-11: 150 mg via INTRAVENOUS

## 2018-07-11 MED ORDER — CHLORHEXIDINE GLUCONATE CLOTH 2 % EX PADS: 6.0000 | MEDICATED_PAD | Freq: Once | CUTANEOUS | Status: DC

## 2018-07-11 MED ORDER — DEXAMETHASONE SODIUM PHOSPHATE 4 MG/ML IJ SOLN
INTRAMUSCULAR | Status: DC | PRN
  Administered 2018-07-11: 10 mg via INTRAVENOUS

## 2018-07-11 MED ORDER — MIDAZOLAM HCL 2 MG/2ML IJ SOLN
INTRAMUSCULAR | Status: AC
  Filled 2018-07-11: qty 2

## 2018-07-11 MED ORDER — MEPERIDINE HCL 25 MG/ML IJ SOLN: 6.2500 mg | INTRAMUSCULAR | Status: DC | PRN

## 2018-07-11 MED ORDER — LACTATED RINGERS IV SOLN: INTRAVENOUS | Status: DC

## 2018-07-11 MED ORDER — FENTANYL CITRATE (PF) 100 MCG/2ML IJ SOLN
25.0000 ug | INTRAMUSCULAR | Status: DC | PRN
  Administered 2018-07-11 (×2): 50 ug via INTRAVENOUS

## 2018-07-11 MED ORDER — FENTANYL CITRATE (PF) 100 MCG/2ML IJ SOLN
INTRAMUSCULAR | Status: AC
  Filled 2018-07-11: qty 2

## 2018-07-11 MED ORDER — ONDANSETRON HCL 4 MG/2ML IJ SOLN
INTRAMUSCULAR | Status: DC | PRN
  Administered 2018-07-11: 4 mg via INTRAVENOUS

## 2018-07-11 MED ORDER — PROPOFOL 10 MG/ML IV BOLUS
INTRAVENOUS | Status: AC
  Filled 2018-07-11: qty 20

## 2018-07-11 MED ORDER — LIDOCAINE 2% (20 MG/ML) 5 ML SYRINGE
INTRAMUSCULAR | Status: AC
  Filled 2018-07-11: qty 5

## 2018-07-11 MED ORDER — ALBUTEROL SULFATE (2.5 MG/3ML) 0.083% IN NEBU
INHALATION_SOLUTION | RESPIRATORY_TRACT | Status: AC
  Filled 2018-07-11: qty 3

## 2018-07-11 MED ORDER — SODIUM CHLORIDE 0.9 % IN NEBU
3.0000 mL | INHALATION_SOLUTION | Freq: Three times a day (TID) | RESPIRATORY_TRACT | Status: DC | PRN
  Administered 2018-07-11: 3 mL via RESPIRATORY_TRACT

## 2018-07-11 MED ORDER — DEXAMETHASONE SODIUM PHOSPHATE 10 MG/ML IJ SOLN
INTRAMUSCULAR | Status: AC
  Filled 2018-07-11: qty 1

## 2018-07-11 MED ORDER — SODIUM CHLORIDE 0.9 % IN NEBU
INHALATION_SOLUTION | RESPIRATORY_TRACT | Status: AC
Start: 2018-07-11 — End: ?
  Filled 2018-07-11: qty 3

## 2018-07-11 MED ORDER — CEFAZOLIN SODIUM-DEXTROSE 2-4 GM/100ML-% IV SOLN
INTRAVENOUS | Status: AC
Start: 2018-07-11 — End: ?
  Filled 2018-07-11: qty 100

## 2018-07-11 MED ORDER — ROCURONIUM BROMIDE 100 MG/10ML IV SOLN
INTRAVENOUS | Status: DC | PRN
  Administered 2018-07-11: 30 mg via INTRAVENOUS

## 2018-07-11 MED ORDER — LIDOCAINE HCL (CARDIAC) PF 100 MG/5ML IV SOSY
PREFILLED_SYRINGE | INTRAVENOUS | Status: DC | PRN
  Administered 2018-07-11: 50 mg via INTRAVENOUS

## 2018-07-11 MED ORDER — SUGAMMADEX SODIUM 500 MG/5ML IV SOLN
INTRAVENOUS | Status: DC | PRN
  Administered 2018-07-11: 400 mg via INTRAVENOUS

## 2018-07-11 MED ORDER — SCOPOLAMINE 1 MG/3DAYS TD PT72: 1.0000 | MEDICATED_PATCH | Freq: Once | TRANSDERMAL | Status: DC | PRN

## 2018-07-11 MED ORDER — ALBUTEROL SULFATE (2.5 MG/3ML) 0.083% IN NEBU
2.5000 mg | INHALATION_SOLUTION | Freq: Four times a day (QID) | RESPIRATORY_TRACT | Status: DC | PRN
  Administered 2018-07-11: 2.5 mg via RESPIRATORY_TRACT

## 2018-07-11 MED ORDER — MIDAZOLAM HCL 2 MG/2ML IJ SOLN
1.0000 mg | INTRAMUSCULAR | Status: DC | PRN
  Administered 2018-07-11: 2 mg via INTRAVENOUS

## 2018-07-11 MED ORDER — ONDANSETRON HCL 4 MG/2ML IJ SOLN
INTRAMUSCULAR | Status: AC
  Filled 2018-07-11: qty 2

## 2018-07-11 MED ORDER — LACTATED RINGERS IV SOLN
INTRAVENOUS | Status: DC
  Administered 2018-07-11: 09:00:00 via INTRAVENOUS

## 2018-07-11 MED ORDER — PROMETHAZINE HCL 25 MG/ML IJ SOLN: 6.2500 mg | INTRAMUSCULAR | Status: DC | PRN

## 2018-07-11 MED ORDER — FENTANYL CITRATE (PF) 100 MCG/2ML IJ SOLN
50.0000 ug | INTRAMUSCULAR | Status: DC | PRN
  Administered 2018-07-11: 100 ug via INTRAVENOUS

## 2018-07-11 MED ORDER — CEFAZOLIN SODIUM-DEXTROSE 2-4 GM/100ML-% IV SOLN
2.0000 g | INTRAVENOUS | Status: AC
  Administered 2018-07-11: 2 g via INTRAVENOUS

## 2018-07-11 SURGICAL SUPPLY — 28 items
CANISTER SUCT 1200ML W/VALVE (MISCELLANEOUS) ×2 IMPLANT
CONT SPEC 4OZ CLIKSEAL STRL BL (MISCELLANEOUS) ×2 IMPLANT
GAUZE SPONGE 4X4 12PLY STRL LF (GAUZE/BANDAGES/DRESSINGS) ×4 IMPLANT
GLOVE BIO SURGEON STRL SZ 6.5 (GLOVE) ×1 IMPLANT
GLOVE SS BIOGEL STRL SZ 7.5 (GLOVE) ×1 IMPLANT
GLOVE SUPERSENSE BIOGEL SZ 7.5 (GLOVE) ×1
GOWN STRL REUS W/ TWL LRG LVL3 (GOWN DISPOSABLE) IMPLANT
GOWN STRL REUS W/ TWL XL LVL3 (GOWN DISPOSABLE) IMPLANT
GOWN STRL REUS W/TWL LRG LVL3 (GOWN DISPOSABLE)
GOWN STRL REUS W/TWL XL LVL3 (GOWN DISPOSABLE)
GUARD TEETH (MISCELLANEOUS) ×2 IMPLANT
MARKER SKIN DUAL TIP RULER LAB (MISCELLANEOUS) IMPLANT
NDL SAFETY ECLIPSE 18X1.5 (NEEDLE) ×1 IMPLANT
NDL SPNL 22GX7 QUINCKE BK (NEEDLE) IMPLANT
NEEDLE HYPO 18GX1.5 SHARP (NEEDLE) ×2
NEEDLE SPNL 22GX7 QUINCKE BK (NEEDLE) IMPLANT
NS IRRIG 1000ML POUR BTL (IV SOLUTION) ×2 IMPLANT
PACK BASIN DAY SURGERY FS (CUSTOM PROCEDURE TRAY) ×2 IMPLANT
PATTIES SURGICAL .5 X3 (DISPOSABLE) ×2 IMPLANT
SHEET MEDIUM DRAPE 40X70 STRL (DRAPES) ×2 IMPLANT
SLEEVE SCD COMPRESS KNEE MED (MISCELLANEOUS) ×2 IMPLANT
SOLUTION ANTI FOG 6CC (MISCELLANEOUS) IMPLANT
SOLUTION BUTLER CLEAR DIP (MISCELLANEOUS) ×2 IMPLANT
SURGILUBE 2OZ TUBE FLIPTOP (MISCELLANEOUS) IMPLANT
SYR 5ML LL (SYRINGE) ×2 IMPLANT
SYR CONTROL 10ML LL (SYRINGE) IMPLANT
TOWEL GREEN STERILE FF (TOWEL DISPOSABLE) ×2 IMPLANT
TUBE CONNECTING 20X1/4 (TUBING) ×2 IMPLANT

## 2018-07-11 NOTE — Anesthesia Preprocedure Evaluation (Addendum)
Anesthesia Evaluation  Patient identified by MRN, date of birth, ID band Patient awake    Reviewed: Allergy & Precautions, NPO status , Patient's Chart, lab work & pertinent test results  Airway Mallampati: II  TM Distance: >3 FB Neck ROM: Full    Dental  (+) Chipped, Poor Dentition,    Pulmonary asthma , COPD,  COPD inhaler, Current Smoker,     + decreased breath sounds      Cardiovascular negative cardio ROS   Rhythm:Regular Rate:Normal     Neuro/Psych Anxiety Depression  Neuromuscular disease    GI/Hepatic Neg liver ROS, GERD  ,  Endo/Other  negative endocrine ROS  Renal/GU negative Renal ROS     Musculoskeletal  (+) Arthritis , Fibromyalgia -  Abdominal (+) + obese,   Peds  Hematology negative hematology ROS (+)   Anesthesia Other Findings   Reproductive/Obstetrics                            Anesthesia Physical Anesthesia Plan  ASA: III  Anesthesia Plan: General   Post-op Pain Management:    Induction: Intravenous  PONV Risk Score and Plan: 3 and Ondansetron, Dexamethasone and Midazolam  Airway Management Planned: Oral ETT  Additional Equipment: None  Intra-op Plan:   Post-operative Plan: Extubation in OR  Informed Consent: I have reviewed the patients History and Physical, chart, labs and discussed the procedure including the risks, benefits and alternatives for the proposed anesthesia with the patient or authorized representative who has indicated his/her understanding and acceptance.   Dental advisory given  Plan Discussed with: CRNA  Anesthesia Plan Comments:       Anesthesia Quick Evaluation

## 2018-07-11 NOTE — Transfer of Care (Signed)
Immediate Anesthesia Transfer of Care Note  Patient: Sheri Rivera  Procedure(s) Performed: DIRECT LARYNGOSCOPY WITH BIOPSY (N/A Throat)  Patient Location: PACU  Anesthesia Type:General  Level of Consciousness: awake and sedated  Airway & Oxygen Therapy: Patient Spontanous Breathing and Patient connected to face mask oxygen  Post-op Assessment: Report given to RN and Post -op Vital signs reviewed and stable  Post vital signs: Reviewed and stable  Last Vitals:  Vitals Value Taken Time  BP    Temp    Pulse 109 07/11/2018 10:15 AM  Resp 19 07/11/2018 10:15 AM  SpO2 99 % 07/11/2018 10:15 AM  Vitals shown include unvalidated device data.  Last Pain:  Vitals:   07/11/18 0852  TempSrc: Oral  PainSc: 6       Patients Stated Pain Goal: 4 (26/69/16 7561)  Complications: No apparent anesthesia complications

## 2018-07-11 NOTE — Anesthesia Procedure Notes (Signed)
Procedure Name: Intubation Performed by: Terrance Mass, CRNA Pre-anesthesia Checklist: Patient identified, Emergency Drugs available, Suction available and Patient being monitored Patient Re-evaluated:Patient Re-evaluated prior to induction Oxygen Delivery Method: Circle system utilized Preoxygenation: Pre-oxygenation with 100% oxygen Induction Type: IV induction Ventilation: Mask ventilation without difficulty Laryngoscope Size: Miller and 2 Grade View: Grade II Tube type: Oral Tube size: 6.5 mm Number of attempts: 1 Airway Equipment and Method: Stylet Placement Confirmation: ETT inserted through vocal cords under direct vision,  positive ETCO2 and breath sounds checked- equal and bilateral Secured at: 21 cm Tube secured with: Tape Dental Injury: Teeth and Oropharynx as per pre-operative assessment

## 2018-07-11 NOTE — Anesthesia Postprocedure Evaluation (Signed)
Anesthesia Post Note  Patient: Sheri Rivera  Procedure(s) Performed: DIRECT LARYNGOSCOPY WITH BIOPSY (N/A Throat)     Patient location during evaluation: PACU Anesthesia Type: General Level of consciousness: awake and alert Pain management: pain level controlled Vital Signs Assessment: post-procedure vital signs reviewed and stable Respiratory status: spontaneous breathing, nonlabored ventilation, respiratory function stable and patient connected to nasal cannula oxygen Cardiovascular status: blood pressure returned to baseline and stable Postop Assessment: no apparent nausea or vomiting Anesthetic complications: no    Last Vitals:  Vitals:   07/11/18 1100 07/11/18 1115  BP: 118/73 (!) 108/56  Pulse: (!) 103 99  Resp: (!) 22 (!) 21  Temp:    SpO2: (!) 85% 93%    Last Pain:  Vitals:   07/11/18 1100  TempSrc:   PainSc: [place]

## 2018-07-11 NOTE — Brief Op Note (Signed)
07/11/2018  10:06 AM  PATIENT:  Sheri Rivera  57 y.o. female  PRE-OPERATIVE DIAGNOSIS:  NEOPLASM OF UNCERTAIN BEHAVIOR  POST-OPERATIVE DIAGNOSIS:  NEOPLASM OF UNCERTAIN BEHAVIOR  PROCEDURE:  Procedure(s): DIRECT LARYNGOSCOPY WITH BIOPSY (N/A)  SURGEON:  Surgeon(s) and Role:    Rozetta Nunnery, MD - Primary  PHYSICIAN ASSISTANT:   ASSISTANTS: none   ANESTHESIA:   general  EBL:  3 mL   BLOOD ADMINISTERED:none  DRAINS: none   LOCAL MEDICATIONS USED:  NONE  SPECIMEN:  Source of Specimen:  right AE fold mass,supraglottic  DISPOSITION OF SPECIMEN:  PATHOLOGY  COUNTS:  YES  TOURNIQUET:  * No tourniquets in log *  DICTATION: .Other Dictation: Dictation Number [id number]  PLAN OF CARE: Discharge to home after PACU  PATIENT DISPOSITION:  PACU - hemodynamically stable.   Delay start of Pharmacological VTE agent (>24hrs) due to surgical blood loss or risk of bleeding: yes

## 2018-07-11 NOTE — Discharge Instructions (Addendum)
Tylenol, ibuprofen or hydrocodone prn pain Call office for follow up appt for next Thursday or Friday    [phone] Diet as tolerated. Throat spray or lozenges for sore throat would be helpful    Post Anesthesia Home Care Instructions  Activity: Get plenty of rest for the remainder of the day. A responsible individual must stay with you for 24 hours following the procedure.  For the next 24 hours, DO NOT: -Drive a car -Paediatric nurse -Drink alcoholic beverages -Take any medication unless instructed by your physician -Make any legal decisions or sign important papers.  Meals: Start with liquid foods such as gelatin or soup. Progress to regular foods as tolerated. Avoid greasy, spicy, heavy foods. If nausea and/or vomiting occur, drink only clear liquids until the nausea and/or vomiting subsides. Call your physician if vomiting continues.  Special Instructions/Symptoms: Your throat may feel dry or sore from the anesthesia or the breathing tube placed in your throat during surgery. If this causes discomfort, gargle with warm salt water. The discomfort should disappear within 24 hours.  If you had a scopolamine patch placed behind your ear for the management of post- operative nausea and/or vomiting:  1. The medication in the patch is effective for 72 hours, after which it should be removed.  Wrap patch in a tissue and discard in the trash. Wash hands thoroughly with soap and water. 2. You may remove the patch earlier than 72 hours if you experience unpleasant side effects which may include dry mouth, dizziness or visual disturbances. 3. Avoid touching the patch. Wash your hands with soap and water after contact with the patch.

## 2018-07-11 NOTE — Op Note (Signed)
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  OPERATION PERFORMED:  direct laryngoscopy with biopsy of right supraglottic aryepiglottic fold mass.  SURGEON:  [name], MD  ANESTHESIA:  General endotracheal.  COMPLICATIONS:  None.  BRIEF CLINICAL NOTE:  The patient is a 57 year old female who has been having chronic right ear pain for about 4 months.  Recent CT scan in the emergency room demonstrated a right hypopharyngeal mass consistent with probable carcinoma residing in the  region of the right piriform sinus.  She is taken to the operating room at this time for direct laryngoscopy and biopsy.  She is having no airway problems and a good voice.  DESCRIPTION OF PROCEDURE:  After adequate endotracheal anesthesia, direct laryngoscopy was performed.  The base of tongue, vallecula and glossal surface of epiglottis were all normal to evaluation.  However, on evaluation of the endolarynx, the patient  had a large mass in the supraglottic area arising from the right AE fold just above the right arytenoid.  It involves most of the right AE fold both medially and laterally.  It does not involve the posterior hypopharyngeal wall or the lateral pharyngeal  wall of the piriform sinus.  Vocal cords are uninvolved.  It measures approximately 2 to 2.5 cm in size.  Several biopsies were obtained and sent to pathology.  Adrenaline was used for hemostasis.  This completed the procedure.  The patient was  subsequently extubated and awoken from anesthesia and transferred to recovery room postoperatively doing well.  Several pictures were  obtained during the direct laryngoscopy and biopsy.  DISPOSITION:  The patient is discharged home later this morning.  She has hydrocodone for pain as well as ibuprofen.  She will follow up in my office in 1 week to review final pathology and further recommendations as far as treatment.  She will be  presented to tumor board next Wednesday.  TN/NUANCE  D:07/11/2018 T:07/11/2018 JOB:[id number]

## 2018-07-11 NOTE — Interval H&P Note (Signed)
History and Physical Interval Note:  07/11/2018 9:19 AM  Sheri Rivera  has presented today for surgery, with the diagnosis of NEOPLASM OF UNCERTAIN BEHAVIOR  The various methods of treatment have been discussed with the patient and family. After consideration of risks, benefits and other options for treatment, the patient has consented to  Procedure(s): DIRECT LARYNGOSCOPY WITH BIOPSY (N/A) as a surgical intervention .  The patient's history has been reviewed, patient examined, no change in status, stable for surgery.  I have reviewed the patient's chart and labs.  Questions were answered to the patient's satisfaction.     [name]

## 2018-07-14 ENCOUNTER — Encounter (HOSPITAL_BASED_OUTPATIENT_CLINIC_OR_DEPARTMENT_OTHER): Payer: Self-pay | Admitting: Otolaryngology

## 2018-07-17 ENCOUNTER — Telehealth: Payer: Self-pay | Admitting: *Deleted

## 2018-07-17 NOTE — Telephone Encounter (Signed)
Oncology Nurse Navigator Documentation  Placed introductory call to new referral patient Ms. Davidon.  Introduced myself as the H&N oncology nurse navigator that works with Dr. Isidore Moos to whom she has been referred by ENT Dr. Lucia Gaskins.   She confirmed understanding of referral, I informed her of 9/3 0930 appt with Dr. Isidore Moos.  She voiced understanding I will coordinate appt with Medical Oncology.                   I briefly explained my role as her navigator, indicated I would be joining her during her appts.  She acknowledged importance of PET scheduled 8/28 [facility] for better understanding extent of her cancer and development of treatment plan.  I explained the purpose of a dental evaluation prior to starting RT, indicated she wd be contacted by [place] office to arrange an appt within a day or so of appt with Dr. Isidore Moos.   Of Note:  She lives alone, mobile home park.  Two dtrs live in [place] but both have complex social situations and are not able to provide support, (one lives across from her, is single mother with 4 children, works overtime for financial reasons; other dtr is homeless).   She does not have transportation.  Hx of PTSD, anxiety, panic attacks.  Has PCP (noted in Care Team).  Poor dentition, "sore gums", hx of wisdom teeth/back molar extractions.  Heavy smoker (requested assistance with smoking cessation).  I confirmed understanding of [place] location for future appts. I provided my contact information, encouraged her to call with questions/concerns prior to appts. She verbalized understanding of information provided, expressed appreciation for my call.  Gayleen Orem, RN, BSN Head & Neck Oncology Nurse [place] at [place] [phone]

## 2018-07-18 ENCOUNTER — Encounter: Payer: Self-pay | Admitting: Radiation Oncology

## 2018-07-21 ENCOUNTER — Telehealth: Payer: Self-pay | Admitting: *Deleted

## 2018-07-21 ENCOUNTER — Telehealth: Payer: Self-pay | Admitting: Radiation Oncology

## 2018-07-21 NOTE — Telephone Encounter (Signed)
Oncology Nurse Navigator Documentation  Pt called to report appt with Dr. Isidore Moos changed to 9/4 from 9/3, explained difficulty with transportation.  I asked her to contact [place] to see about rescheduling appt in conjunction with Dr. Isidore Moos, to call me with follow-up.  Gayleen Orem, RN, BSN Head & Neck Oncology Nurse [place] at [place] [phone]

## 2018-07-21 NOTE — Telephone Encounter (Signed)
Called pt to assess her transportation needs - left contact information for her to call me when she gets the chance

## 2018-07-22 ENCOUNTER — Telehealth: Payer: Self-pay | Admitting: *Deleted

## 2018-07-22 ENCOUNTER — Encounter: Payer: Self-pay | Admitting: *Deleted

## 2018-07-22 NOTE — Telephone Encounter (Signed)
Oncology Nurse Navigator Documentation  [facility]MM for pt informing appt to see Dr. Isidore Moos RadOnc has been reinstated for 8/3 9:30/10:00 followed by Dr. Wallace [facility] 12:30.  I indicated transportation will be arranged.  Asked her to return call to confirm receipt of information.  Gayleen Orem, RN, BSN Head & Neck Oncology Nurse [place] at [place] [phone]

## 2018-07-22 NOTE — Progress Notes (Signed)
CSW received referral from head and neck navigator for complex social issues including anxiety, lack of support, and transportation concerns. CSW will meet with patient 8/3.  Maryjean Morn, MSW, LCSW, OSW-C Clinical Social Worker [facility] [phone]

## 2018-07-22 NOTE — Progress Notes (Addendum)
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  Patient presented months ago with symptoms of: She presented to the ED on 06/22/18 and 06/29/18 with throat pain, right ear pain, and feeling like her throat was swelling.   Biopsies of Larynx revealed: squamous cell carcinoma.   Nutrition Status Yes No Comments  Weight changes? []  [x]    Swallowing concerns? [x]  []  She needs to chew her food well. She reports increased mucous since biopsy that causes her to choke at times.   PEG? []  [x]     Referrals Yes No Comments  Social Work? []  [x]    Dentistry? [x]  []  Dr. Enrique Sack today  Swallowing therapy? []  [x]    Nutrition? []  [x]    Med/Onc? [x]  []  Dr. Burr Medico 08/01/18   Safety Issues Yes No Comments  Prior radiation? []  [x]    Pacemaker/ICD? []  [x]    Possible current pregnancy? []  [x]    Is the patient on methotrexate? []  [x]     Tobacco/Marijuana/Snuff/ETOH use: She is a current smoker. She tells me that due to anxiety she has recently increased her smoking from 1 pack daily to 1.5 packs daily.   Past/Anticipated interventions by otolaryngology, if any:  07/11/18 PROCEDURE:  Procedure(s): DIRECT LARYNGOSCOPY WITH BIOPSY (N/A) SURGEON:  Surgeon(s) and Role:    Rozetta Nunnery, MD - Primary   Past/Anticipated interventions by medical oncology, if any:  None scheduled.    Current Complaints / other details:   PET 07/23/18  Ms. Carreno is extremely anxious today. She is concerned about treatment 5 days a week.   BP 138/85 (BP Location: Left Arm, Patient Position: Sitting)   Pulse 71   Temp 97.8 F (36.6 C) (Oral)   Resp 18   Ht 5\' 3"  (1.6 m)   Wt 182 lb (82.6 kg)   SpO2 94%   BMI 32.24 kg/m    Wt Readings from Last 3 Encounters:  07/29/18 182 lb (82.6 kg)  07/11/18 178 lb 8 oz (81 kg)  06/29/18 174 lb (78.9 kg)

## 2018-07-23 ENCOUNTER — Telehealth: Payer: Self-pay | Admitting: *Deleted

## 2018-07-23 ENCOUNTER — Encounter: Payer: Self-pay | Admitting: *Deleted

## 2018-07-23 ENCOUNTER — Encounter (HOSPITAL_COMMUNITY)
Admission: RE | Admit: 2018-07-23 | Discharge: 2018-07-23 | Disposition: A | Discharge status: Home / Self Care | Payer: Medicaid Other | Source: Ambulatory Visit | Attending: Otolaryngology | Admitting: Otolaryngology

## 2018-07-23 ENCOUNTER — Other Ambulatory Visit: Payer: Self-pay | Admitting: *Deleted

## 2018-07-23 DIAGNOSIS — C321 Malignant neoplasm of supraglottis: Secondary | ICD-10-CM | POA: Diagnosis not present

## 2018-07-23 LAB — GLUCOSE, CAPILLARY: Glucose-Capillary: 95 mg/dL (ref 70–99)

## 2018-07-23 MED ORDER — FLUDEOXYGLUCOSE F - 18 (FDG) INJECTION
8.4200 | Freq: Once | INTRAVENOUS | Status: AC | PRN
  Administered 2018-07-23: 8.42 via INTRAVENOUS

## 2018-07-23 NOTE — Telephone Encounter (Signed)
Oncology Nurse Navigator Documentation  LVMM for pt informing her of 9/6 2:30 appt with Dr. Burr Medico, [place].  Gayleen Orem, RN, BSN Head & Neck Oncology Nurse [place] at [place] [phone]

## 2018-07-25 ENCOUNTER — Telehealth: Payer: Self-pay | Admitting: *Deleted

## 2018-07-25 NOTE — Telephone Encounter (Signed)
Oncology Nurse Navigator Documentation  Called Sheri Rivera with results of PET, stated disease localized, NED elsewhere.  She expressed relief and appreciation for my call.  Gayleen Orem, RN, BSN Head & Neck Oncology Nurse [place] at [place] [phone]

## 2018-07-29 ENCOUNTER — Encounter: Payer: Self-pay | Admitting: *Deleted

## 2018-07-29 ENCOUNTER — Encounter: Payer: Self-pay | Admitting: Radiation Oncology

## 2018-07-29 ENCOUNTER — Ambulatory Visit
Admission: RE | Admit: 2018-07-29 | Discharge: 2018-07-29 | Disposition: A | Discharge status: Home / Self Care | Payer: Medicaid Other | Source: Ambulatory Visit | Attending: Radiation Oncology | Admitting: Radiation Oncology

## 2018-07-29 ENCOUNTER — Ambulatory Visit: Payer: Medicaid Other | Admitting: Radiation Oncology

## 2018-07-29 ENCOUNTER — Other Ambulatory Visit: Payer: Self-pay

## 2018-07-29 ENCOUNTER — Ambulatory Visit (HOSPITAL_COMMUNITY): Payer: Medicaid - Dental | Admitting: Dentistry

## 2018-07-29 ENCOUNTER — Encounter (HOSPITAL_COMMUNITY): Payer: Self-pay | Admitting: Dentistry

## 2018-07-29 VITALS — BP 138/85 | HR 71 | Temp 97.8°F | Resp 18 | Ht 63.0 in | Wt 182.0 lb

## 2018-07-29 VITALS — BP 119/85 | HR 77 | Temp 98.0°F

## 2018-07-29 DIAGNOSIS — K08409 Partial loss of teeth, unspecified cause, unspecified class: Secondary | ICD-10-CM | POA: Diagnosis not present

## 2018-07-29 DIAGNOSIS — K030 Excessive attrition of teeth: Secondary | ICD-10-CM

## 2018-07-29 DIAGNOSIS — M2630 Unspecified anomaly of tooth position of fully erupted tooth or teeth: Secondary | ICD-10-CM

## 2018-07-29 DIAGNOSIS — K031 Abrasion of teeth: Secondary | ICD-10-CM | POA: Diagnosis not present

## 2018-07-29 DIAGNOSIS — K06010 Localized gingival recession, unspecified: Secondary | ICD-10-CM | POA: Diagnosis not present

## 2018-07-29 DIAGNOSIS — Z882 Allergy status to sulfonamides status: Secondary | ICD-10-CM | POA: Diagnosis not present

## 2018-07-29 DIAGNOSIS — F17210 Nicotine dependence, cigarettes, uncomplicated: Secondary | ICD-10-CM | POA: Insufficient documentation

## 2018-07-29 DIAGNOSIS — Z885 Allergy status to narcotic agent status: Secondary | ICD-10-CM | POA: Diagnosis not present

## 2018-07-29 DIAGNOSIS — M264 Malocclusion, unspecified: Secondary | ICD-10-CM | POA: Diagnosis not present

## 2018-07-29 DIAGNOSIS — F419 Anxiety disorder, unspecified: Secondary | ICD-10-CM | POA: Insufficient documentation

## 2018-07-29 DIAGNOSIS — M797 Fibromyalgia: Secondary | ICD-10-CM | POA: Diagnosis not present

## 2018-07-29 DIAGNOSIS — Z881 Allergy status to other antibiotic agents status: Secondary | ICD-10-CM | POA: Insufficient documentation

## 2018-07-29 DIAGNOSIS — K0530 Chronic periodontitis, unspecified: Secondary | ICD-10-CM

## 2018-07-29 DIAGNOSIS — M2632 Excessive spacing of fully erupted teeth: Secondary | ICD-10-CM

## 2018-07-29 DIAGNOSIS — C320 Malignant neoplasm of glottis: Secondary | ICD-10-CM

## 2018-07-29 DIAGNOSIS — Z01818 Encounter for other preprocedural examination: Secondary | ICD-10-CM

## 2018-07-29 DIAGNOSIS — J449 Chronic obstructive pulmonary disease, unspecified: Secondary | ICD-10-CM | POA: Diagnosis not present

## 2018-07-29 DIAGNOSIS — Z79899 Other long term (current) drug therapy: Secondary | ICD-10-CM | POA: Diagnosis not present

## 2018-07-29 DIAGNOSIS — Z888 Allergy status to other drugs, medicaments and biological substances status: Secondary | ICD-10-CM | POA: Insufficient documentation

## 2018-07-29 DIAGNOSIS — K036 Deposits [accretions] on teeth: Secondary | ICD-10-CM

## 2018-07-29 DIAGNOSIS — C321 Malignant neoplasm of supraglottis: Secondary | ICD-10-CM | POA: Diagnosis not present

## 2018-07-29 MED ORDER — LARYNGOSCOPY SOLUTION RAD-ONC
15.0000 mL | Freq: Once | TOPICAL | Status: AC
  Administered 2018-07-29: 15 mL via TOPICAL
  Filled 2018-07-29: qty 15

## 2018-07-29 MED ORDER — HYDROCODONE-ACETAMINOPHEN 5-325 MG PO TABS: 1.0000 | ORAL_TABLET | Freq: Four times a day (QID) | ORAL | 0 refills | Status: DC | PRN

## 2018-07-29 MED ORDER — LORAZEPAM 1 MG PO TABS: 0.5000 mg | ORAL_TABLET | Freq: Three times a day (TID) | ORAL | 0 refills | Status: DC | PRN

## 2018-07-29 NOTE — Addendum Note (Signed)
Encounter addended by: Audric Venn, Stephani Police, RN on: 07/29/2018 1:21 PM  Actions taken: [facility] administration accepted

## 2018-07-29 NOTE — Progress Notes (Signed)
Radiation Oncology         [phone] ________________________________  Initial Outpatient Consultation  Name: Sheri Rivera MRN: [id number]  Date: 07/29/2018  DOB: 1961-09-12  CC:Vonna Drafts, FNP  Rozetta Nunnery, *   REFERRING PHYSICIAN: [name] E, *  DIAGNOSIS:    ICD-10-CM   1. 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 Malignant neoplasm of glottis ([place]) C32.0 Ambulatory referral to Social Work   Cancer Staging Malignant neoplasm of supraglottis ([place]) Staging form: Larynx - Supraglottis, AJCC 8th Edition - Clinical: Stage IVA (cT3, cN2b, cM0) - Signed by Eppie Gibson, MD on 07/30/2018   CHIEF COMPLAINT: Here to discuss management of laryngeal cancer  HISTORY OF PRESENT ILLNESS::Sheri Rivera is a 57 y.o. female who presented to the emergency department on 06/22/18 with worsening right throat pain and chronic right ear pain. X-ray at that time showed negative cervical soft tissues. She was started on Augmentin, Sudafed, and lidocaine without any symptom improvement. She returned to the emergency department on 06/29/18 with worsening throat pain and new onset dysphagia and shortness of breath. She had a neck CT done that showed a 1.9 cm mass in the right piriform sinus. There was no evidence of adenopathy.  Subsequently, the patient saw Dr. Lucia Gaskins on 07/04/18 who performed direct laryngoscopy when he biopsied the right supraglottic mass. This revealed a large mass arising form the right AE fold, just above the right arytenoid. It measured about 2-2.5 cm.  Per tumor board discussion this is felt to be at least T2 and possibly a T3 lesion clinically  Biopsy of the right AE fold supraglottic mass on 07/11/18 revealed: Squamous cell carcinoma. The carcinoma appears moderately differentiated. The vast  majority of the carcinoma appears in situ, although there are microscopic foci suspicious for invasive disease.   Pertinent imaging thus far includes PET performed on 07/23/18 revealing the right supraglottic/piriformis sinus mass is markedly hypermetabolic and consistent with known squamous cell neoplasm.   Two small right supraclavicular nodes, both measuring 5.5 mm, are hypermetabolic and consistent with nodal metastasis. No findings for metastatic disease involving the chest, abdomen, or pelvis.  The patient has been referred today for discussion of potential radiation treatment options. We are joined in consultation by Gayleen Orem, RN, our Head and Neck Navigator. Today, the patient reports:  Swallowing issues, if any: She states that she enjoys cooking and is eating normally but needs to chew her food well. She reports increased mucous/drainage since biopsy that causes her to choke at times.   Weight Changes:  Wt Readings from Last 3 Encounters:  07/29/18 182 lb (82.6 kg)  07/11/18 178 lb 8 oz (81 kg)  06/29/18 174 lb (78.9 kg)   Pain status: She reports history of fibromyalgia and takes hydrocodone as needed for pain. This is managed by her PCP.  She also has throat pain  Other symptoms: She reports voice hoarseness that comes and goes. She reports shortness of breath and states that she was diagnosed with COPD 8-9 years ago. She reports severe anxiety today. She has a history of panic attacks and states that she has been taking anxiety medication since her 5s.  Tobacco history, if any: She is a current smoker. She states that due to anxiety she has recently increased her smoking from 1 pack daily to 1.5 packs daily.  ETOH abuse, if any: She denies any alcohol use.  Prior cancers, if any: She denies.  The patient lives alone here in [place] with her two dachshunds. She enjoys cooking and gardening. She states that it is very important for her to be able to maintain her independence  and mobility as she will be going through treatment alone. She has three children that she says are not a reliable support system for her. She has her first great-grandchild on the way that she is very excited about.   PREVIOUS RADIATION THERAPY: No  PAST MEDICAL HISTORY:  has a past medical history of Anxiety (11/11/11), Arthritis, Asthma, Bursitis, Chronic back pain, Chronic neck pain, Chronic shoulder pain, COPD (chronic obstructive pulmonary disease) ([place]), DDD (degenerative disc disease), Depression, Fibromyalgia, GERD (gastroesophageal reflux disease), Heart murmur, Herpes, IBS (irritable bowel syndrome), Lumbar radiculopathy, Seasonal allergies, Smoker, and Thyroid nodule.    PAST SURGICAL HISTORY: Past Surgical History:  Procedure Laterality Date  . CHOLECYSTECTOMY    . DIRECT LARYNGOSCOPY N/A 07/11/2018   Procedure: DIRECT LARYNGOSCOPY WITH BIOPSY;  Surgeon: Rozetta Nunnery, MD;  Location: [facility];  Service: ENT;  Laterality: N/A;  . HERNIA REPAIR     [place]   . HIATAL HERNIA REPAIR    . TUBAL LIGATION  1983    FAMILY HISTORY: family history includes Arthritis in her mother; Asthma in her child and mother; Bursitis in her mother; Fibromyalgia in her sister; Heart attack (age of onset: 89) in her father; Lung cancer in her mother; Obesity in her father.  SOCIAL HISTORY:  reports that she has been smoking cigarettes. She has a 40.50 pack-year smoking history. She has never used smokeless tobacco. She reports that she does not drink alcohol or use drugs.  ALLERGIES: Clindamycin/lincomycin; Cymbalta [duloxetine hcl]; Levaquin [levofloxacin in d5w]; Lyrica [pregabalin]; Shellfish allergy; Sulfamethoxazole; and Codeine  MEDICATIONS:  Current Outpatient Medications  Medication Sig Dispense Refill  . albuterol (PROVENTIL HFA;VENTOLIN HFA) 108 (90 BASE) MCG/ACT inhaler Inhale 2 puffs into the lungs every 6 (six) hours as needed for shortness of breath. Shortness of  breath    . albuterol (PROVENTIL) (2.5 MG/3ML) 0.083% nebulizer solution Take 3 mLs (2.5 mg total) by nebulization every 4 (four) hours as needed for wheezing or shortness of breath. Shortness of breath 25 vial 0  . fexofenadine-pseudoephedrine (ALLEGRA-D 24) 180-240 MG 24 hr tablet Take 1 tablet by mouth daily.    Marland Kitchen ibuprofen (ADVIL,MOTRIN) 600 MG tablet Take 600 mg by mouth 2 (two) times daily.    Marland Kitchen LORazepam (ATIVAN) 1 MG tablet Take 0.5 tablets (0.5 mg total) by mouth every 8 (eight) hours as needed for anxiety. 30 tablet 0  . simethicone (GAS-X) 80 MG chewable tablet Chew 160 mg by mouth daily.     Marland Kitchen HYDROcodone-acetaminophen (NORCO/VICODIN) 5-325 MG tablet Take 1 tablet by mouth every 6 (six) hours as needed for severe pain. 40 tablet 0   No current facility-administered medications for this encounter.     REVIEW OF SYSTEMS:  As above   PHYSICAL EXAM:  height is 5\' 3"  (1.6 m) and weight is 182 lb (82.6 kg). Her oral temperature is 97.8 F (36.6 C). Her blood pressure is 138/85 and her pulse is 71. Her respiration is 18 and oxygen saturation is 94%.   General: Alert and oriented, in no acute distress. HEENT: Head is normocephalic. Extraocular movements are intact. No lesions appreciated in the oral cavity or upper throat. Mucous membranes are somewhat dry. Neck: Neck  is negative for any masses. Heart: Regular in rate and rhythm with no murmurs, rubs, or gallops. Chest: Decreased breath sounds bilaterally with crackles in the bases. Abdomen: Soft, nontender, nondistended, with no rigidity or guarding. Extremities: No cyanosis or edema. Lymphatics: see Neck Exam Skin: No concerning lesions. Musculoskeletal: Symmetric strength and muscle tone throughout. Neurologic: Cranial nerves II through XII are grossly intact. No obvious focalities. Speech is fluent. Coordination is intact. Finger to nose testing intact. Psychiatric: Judgment and insight are intact. Affect is  appropriate.  PROCEDURE NOTE: After obtaining consent and anesthetizing the nasal cavity with topical lidocaine and phenylephrine, the flexible endoscope was introduced and passed through the nasal cavity.  She has diffuse edema in the supraglottis. She has a mass that is arising from the right arytenoid which appears to extend from the posterior commissure  through the AE fold. As far as I can tell, it does not involve her true cords - they are mobile but I cannot fully visualize them due to the edema and mass. No other lesions appreciated in the pharynx or the larynx.    ECOG = 2  0 - Asymptomatic (Fully active, able to carry on all predisease activities without restriction)  1 - Symptomatic but completely ambulatory (Restricted in physically strenuous activity but ambulatory and able to carry out work of a light or sedentary nature. For example, light housework, office work)  2 - Symptomatic, <50% in bed during the day (Ambulatory and capable of all self care but unable to carry out any work activities. Up and about more than 50% of waking hours)  3 - Symptomatic, >50% in bed, but not bedbound (Capable of only limited self-care, confined to bed or chair 50% or more of waking hours)  4 - Bedbound (Completely disabled. Cannot carry on any self-care. Totally confined to bed or chair)  5 - Death   Eustace Pen MM, Creech RH, Tormey DC, et al. [phone]). "Toxicity and response criteria of the [facility]". [place] Oncol. 5 (6): 649-55   LABORATORY DATA:  Lab Results  Component Value Date   WBC 9.0 06/29/2018   HGB 16.2 (H) 06/29/2018   HCT 48.3 (H) 06/29/2018   MCV 94.2 06/29/2018   PLT 272 06/29/2018   CMP     Component Value Date/Time   NA 142 06/29/2018 1726   K 3.8 06/29/2018 1726   CL 107 06/29/2018 1726   CO2 25 06/29/2018 1726   GLUCOSE 99 06/29/2018 1726   BUN 7 06/29/2018 1726   CREATININE 0.43 (L) 06/29/2018 1726   CALCIUM 9.4 06/29/2018 1726   PROT  5.9 (L) 04/15/2018 1334   ALBUMIN 3.3 (L) 04/15/2018 1334   AST 18 04/15/2018 1334   ALT 16 04/15/2018 1334   ALKPHOS 81 04/15/2018 1334   BILITOT 0.9 04/15/2018 1334   GFRNONAA >60 06/29/2018 1726   GFRAA >60 06/29/2018 1726     \ Lab Results  Component Value Date   TSH 0.35 02/25/2013      RADIOGRAPHY: Nm Pet Image Initial (pi) Skull Base To Thigh  Result Date: 07/24/2018 CLINICAL DATA:  Initial treatment strategy for right supraglottic mass (squamous cell carcinoma). EXAM: NUCLEAR MEDICINE PET SKULL BASE TO THIGH TECHNIQUE: 8.42 mCi F-18 FDG was injected intravenously. Full-ring PET imaging was performed from the skull base to thigh after the radiotracer. CT data was obtained and used for attenuation correction and anatomic localization. Fasting blood glucose: 95 mg/dl COMPARISON:  Neck CT 06/29/2018 FINDINGS: Mediastinal blood  pool activity: SUV max 2.32 NECK: The right supraglottic lesion is markedly hypermetabolic with SUV max of 25.85. It does not definitely cross the midline or involve the true cords. There are 2 right-sided supraclavicular nodes both measuring 5.5 mm. These are hypermetabolic with SUV max of 2.77. No other neck adenopathy is identified. Incidental CT findings: Multinodular thyroid goiter. CHEST: No hypermetabolic mediastinal or hilar nodes. No suspicious pulmonary nodules on the CT scan. No enlarged or hypermetabolic axillary lymph nodes and no breast masses. Incidental CT findings: Severe emphysema and pulmonary scarring. Significant respiratory motion but no obvious pulmonary lesions. ABDOMEN/PELVIS: No abnormal hypermetabolic activity within the liver, pancreas, adrenal glands, or spleen. No hypermetabolic lymph nodes in the abdomen or pelvis. Incidental CT findings: Surgical changes at the GE junction. Status post cholecystectomy with mild biliary dilatation. Moderate scattered atherosclerotic calcifications involving the aorta and iliac arteries. Sigmoid  diverticulosis without findings for acute diverticulitis. Moderate cystocele. SKELETON: No focal hypermetabolic activity to suggest skeletal metastasis. Incidental CT findings: none IMPRESSION: 1. Right supraglottic/piriformis sinus mass is markedly hypermetabolic and consistent with known squamous cell neoplasm. 2. Two small right supraclavicular nodes are hypermetabolic and consistent with nodal metastasis. 3. No findings for metastatic disease involving the chest, abdomen or pelvis. 4. Incidental findings as detailed above. Electronically Signed   By: Marijo Sanes M.D.   On: 07/24/2018 10:06      IMPRESSION/PLAN: This is a delightful patient with supraglottic head and neck cancer. I do recommend radiotherapy for this patient.  Per tumor board discussion she is not a good surgical candidate in part due to her COPD  We discussed the potential risks, benefits, and side effects of radiotherapy. We talked in detail about acute and late effects. We discussed that some of the most bothersome acute effects may be mucositis, dysgeusia, salivary changes, skin irritation, hair loss, dehydration, weight loss and fatigue. We talked about late effects which include but are not necessarily limited to dysphagia, hypothyroidism, nerve injury, spinal cord injury, xerostomia, trismus, and neck edema. No guarantees of treatment were given. A consent form was signed and placed in the patient's medical record. The patient is enthusiastic about proceeding with treatment. I look forward to participating in the patient's care.    Simulation (treatment planning) will take place later this week with treatment to begin approximately 1.5 weeks later -that is if she is released by dentistry for [place] to start the treatment planning. Patient does have claustrophobia. I refilled her Ativan today. I also refilled hydrocodone for her throat pain. Patient's preferred pharmacy is [facility] on [address].  We also discussed that the  treatment of head and neck cancer is a multidisciplinary process to maximize treatment outcomes and quality of life. For this reasons the following referrals have been or will be made:  1. Medical oncology to discuss chemotherapy. Patient is scheduled for consultation with Dr. Burr Medico on 08/01/2018.  She understands that a feeding tube will be recommended if she undergoes concurrent chemoradiation.  I am not sure if she will be a candidate for chemotherapy or if she will accept chemotherapy as she has multiple reservations about this.  She does understand that chemotherapy would improve her chance of cure  2. Dentistry for dental evaluation, possible extractions in the radiation fields, and /or advice on reducing risk of cavities, osteoradionecrosis, or other oral issues. Patient will see Dr. Enrique Sack today.  3. Nutritionist for nutrition support during and after treatment.  4. Speech language pathology for swallowing and/or speech  therapy.  5. Social work for social support. We will connect the patient with a mentor.  6. Physical therapy due to risk of lymphedema in neck and deconditioning.  7. Baseline labs including TSH.  8. I asked the patient today about tobacco use. The patient uses tobacco.  I advised the patient to quit. Services were offered by me today including outpatient counseling. I assessed for the willingness to attempt to quit and provided encouragement and demonstrated willingness to make referrals and/or prescriptions to help the patient attempt to quit. The patient has follow-up with the oncologic team to touch base on their tobacco use and /or cessation efforts.  Over 3 minutes were spent on this issue. The patient is agreeable to try decreasing her tobacco use but does not seem highly motivated to quit.  I spent 55 minutes face to face with the patient and more than 50% of that time was spent in counseling and/or coordination of  care. __________________________________________   Eppie Gibson, MD  This document serves as a record of services personally performed by Eppie Gibson, MD. It was created on her behalf by Rae Lips, a trained medical scribe. The creation of this record is based on the scribe's personal observations and the provider's statements to them. This document has been checked and approved by the attending provider.

## 2018-07-29 NOTE — Progress Notes (Signed)
DENTAL CONSULTATION  Date of Consultation:  07/29/2018 Patient Name:   Sheri Rivera Date of Birth:   1961-11-12 Medical Record Number: [id number]  VITALS: BP 119/85 (BP Location: Right Arm)   Pulse 77   Temp 98 F (36.7 C)   CHIEF COMPLAINT: Patient referred by Dr. Isidore Moos for a dental consultation.  HPI: Sheri Rivera is a 57 year old female recently diagnosed with supraglottic cancer. Patient with anticipated radiation therapy and possible chemotherapy. Patient now seen as part of a pre-chemoradiation therapy dental protocol examination  The patient currently denies acute toothaches, swellings, or abscesses. Patient was last seen for an exam and cleaning with Dr. Bonnee Quin approximately one year ago. Patient had a history of tooth sensitivity at the gum line associated with a flexure lesion.  Dr. Bonnee Quin recommended a resin restoration at that time, the patient refused.  The patient denies having partial dentures. 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  PROBLEM LIST: Patient Active Problem List   Diagnosis Date Noted  . Malignant neoplasm of supraglottis ([place]) 07/29/2018  . Acute respiratory failure with hypoxia ([place]) 08/03/2017  . GERD (gastroesophageal reflux disease) 06/13/2016  . Chronic obstructive pulmonary disease ([place]) 06/13/2016  . Current tobacco use 09/05/2015  . History of fracture of vertebra 02/16/2015  . Diffuse pain 04/03/2014  . Hemorrhage, postmenopausal 12/29/2013  . Chronic obstructive pulmonary disease with acute exacerbation ([place]) 10/30/2013  . COPD mixed type ([place]) 03/30/2013  . Abdominal pain, epigastric 02/25/2013  . Dysphagia, unspecified(787.20) 02/25/2013  . Gas 02/25/2013  . Abdominal bloating 02/25/2013  . Precordial pain 02/16/2013  . Multinodular goiter 10/05/2012  . 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 Bladder cystocele 07/21/2012  . Clinical  depression 11/11/2011  . Allergic rhinitis 11/11/2011  . URI (upper respiratory infection) 09/28/2011  . Sore throat 07/18/2011  . Neck swelling 06/22/2011  . Ingrown right big toenail 05/23/2011  . Chronic back pain 03/16/2011  . 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/05/2010  . Shortness of breath 03/15/2010  . GOITER, MULTINODULAR 07/01/2009  . FIBROMYALGIA 07/01/2009  . ALLERGIC RHINITIS CAUSE UNSPECIFIED 03/02/2008  . Anxiety 01/23/2007  . TOBACCO DEPENDENCE 01/23/2007  . GASTROESOPHAGEAL REFLUX, NO ESOPHAGITIS 01/23/2007    PMH: Past Medical History:  Diagnosis Date  . Anxiety 11/11/11  . Arthritis   . Asthma   . Bursitis    right shoulder  . Chronic back pain   . Chronic neck pain   . Chronic shoulder pain   . COPD (chronic obstructive pulmonary disease) ([place])    continues to smoke 1ppd  . DDD (degenerative disc disease)   . Depression   . Fibromyalgia   . GERD (gastroesophageal reflux disease)   . Heart murmur    as a child per pt  . Herpes   . IBS (irritable bowel syndrome)   . Lumbar radiculopathy   . Seasonal allergies   . Smoker   . Thyroid nodule    goiters    PSH: Past Surgical History:  Procedure Laterality Date  . CHOLECYSTECTOMY    . DIRECT LARYNGOSCOPY N/A 07/11/2018   Procedure: DIRECT LARYNGOSCOPY WITH BIOPSY;  Surgeon: Rozetta Nunnery, MD;  Location: [facility];  Service: ENT;  Laterality: N/A;  . HERNIA REPAIR     [place]   . HIATAL HERNIA REPAIR    . TUBAL LIGATION  1983    ALLERGIES: Allergies  Allergen Reactions  . Clindamycin/Lincomycin Anaphylaxis  and Swelling  . Cymbalta [Duloxetine Hcl] Anaphylaxis and Itching    Lips and throat swelled; stopped breathing  . Levaquin [Levofloxacin In D5w] Anaphylaxis and Itching  . Lyrica [Pregabalin] Shortness Of Breath and Swelling    Lips and throat swelled  . 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 Sulfamethoxazole Anaphylaxis  . Codeine Anxiety    Sweating,nervous     MEDICATIONS: Current Outpatient Medications  Medication Sig Dispense Refill  . albuterol (PROVENTIL HFA;VENTOLIN HFA) 108 (90 BASE) MCG/ACT inhaler Inhale 2 puffs into the lungs every 6 (six) hours as needed for shortness of breath. Shortness of breath    . albuterol (PROVENTIL) (2.5 MG/3ML) 0.083% nebulizer solution Take 3 mLs (2.5 mg total) by nebulization every 4 (four) hours as needed for wheezing or shortness of breath. Shortness of breath 25 vial 0  . fexofenadine-pseudoephedrine (ALLEGRA-D 24) 180-240 MG 24 hr tablet Take 1 tablet by mouth daily.    Marland Kitchen ibuprofen (ADVIL,MOTRIN) 600 MG tablet Take 600 mg by mouth 2 (two) times daily.    Marland Kitchen LORazepam (ATIVAN) 1 MG tablet Take 0.5 tablets (0.5 mg total) by mouth every 8 (eight) hours as needed for anxiety. 30 tablet 0  . simethicone (GAS-X) 80 MG chewable tablet Chew 160 mg by mouth daily.     Marland Kitchen HYDROcodone-acetaminophen (NORCO/VICODIN) 5-325 MG tablet Take 1 tablet by mouth every 6 (six) hours as needed for severe pain. 40 tablet 0   No current facility-administered medications for this visit.      LABS: Lab Results  Component Value Date   WBC 9.0 06/29/2018   HGB 16.2 (H) 06/29/2018   HCT 48.3 (H) 06/29/2018   MCV 94.2 06/29/2018   PLT 272 06/29/2018      Component Value Date/Time   NA 142 06/29/2018 1726   K 3.8 06/29/2018 1726   CL 107 06/29/2018 1726   CO2 25 06/29/2018 1726   GLUCOSE 99 06/29/2018 1726   BUN 7 06/29/2018 1726   CREATININE 0.43 (L) 06/29/2018 1726   CALCIUM 9.4 06/29/2018 1726   GFRNONAA >60 06/29/2018 1726   GFRAA >60 06/29/2018 1726   Lab Results  Component Value Date   INR 0.9 06/15/2008   INR 1.0 01/20/2007   No results found for: PTT  SOCIAL HISTORY: Social History   Socioeconomic History  . Marital status: Divorced    Spouse name: Not on file  . Number of children: 3  . Years of education: Not on file  . Highest education level: Not on file  Occupational History    Employer:  [employer]  Social Needs  . Financial resource strain: Not on file  . Food insecurity:    Worry: Not on file    Inability: Not on file  . Transportation needs:    Medical: Not on file    Non-medical: Not on file  Tobacco Use  . Smoking status: Current Every Day Smoker    Packs/day: 1.50    Years: 27.00    Pack years: 40.50    Types: Cigarettes  . Smokeless tobacco: Never Used  Substance and Sexual Activity  . Alcohol use: No  . Drug use: No  . Sexual activity: Not on file  Lifestyle  . Physical activity:    Days per week: Not on file    Minutes per session: Not on file  . Stress: Not on file  Relationships  . Social connections:    Talks on phone: Not on file  Gets together: Not on file    Attends religious service: Not on file    Active member of club or organization: Not on file    Attends meetings of clubs or organizations: Not on file    Relationship status: Not on file  . Intimate partner violence:    Fear of current or ex partner: No    Emotionally abused: No    Physically abused: No    Forced sexual activity: No  Other Topics Concern  . Not on file  Social History Narrative   She lives by herself    FAMILY HISTORY: Family History  Problem Relation Age of Onset  . Lung cancer Mother   . Arthritis Mother        Rheumatoid  . Bursitis Mother   . Asthma Mother   . Heart attack Father 57       Died of MI  . Obesity Father   . Fibromyalgia Sister   . Asthma Child     REVIEW OF SYSTEMS: Reviewed with the patient as per History of present illness. Psych: The patient has a history of depression and anxiety. 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  DENTAL HISTORY: CHIEF COMPLAINT: Patient referred by Dr. Isidore Moos for a dental consultation.  HPI: Sheri Rivera is a 57 year old female recently diagnosed with supraglottic cancer. Patient with anticipated radiation therapy  and possible chemotherapy. Patient now seen as part of a pre-chemoradiation therapy dental protocol examination  The patient currently denies acute toothaches, swellings, or abscesses. Patient was last seen for an exam and cleaning with Dr. Bonnee Quin approximately one year ago. Patient had a history of tooth sensitivity at the gum line associated with a flexure lesion.  Dr. Bonnee Quin recommended a resin restoration at that time, the patient refused.  The patient denies having partial dentures. 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   DENTAL EXAMINATION: GENERAL: The patient is a well-developed, well-nourished female in no acute distress. HEAD AND NECK:  There is no palpable neck lymphadenopathy. The patient denies acute TMJ symptoms. INTRAORAL EXAM:   Patient has incipient xerostomia. There is no evidence of oral abscess formation. DENTITION: The patient is missing tooth numbers 1, 2, 14, 15, 16, 17, 18, 19, 30, 31, and 32.  Multiple diastemas are noted. There is evidence of excessive maxillary incisal attrition. Multiple rotated teeth are noted. PERIODONTAL: the patient has chronic periodontitis with plaque and calculus accumulations, generalized gingival recession, and incipient to moderate bone loss. There is no significant tooth mobility at this time. DENTAL CARIES/SUBOPTIMAL RESTORATIONS:  There are no dental caries noted. Patient has multiple flexure lesions. ENDODONTIC:  The patient currently denies acute pulpitis symptoms. There is questionable periapical radiolucency associated with the distal root of tooth #3. However, the tooth tested positive on electric pulp testing. Tooth #3 was percussion and palpation negative. CROWN AND BRIDGE:  There are no crown or bridge restorations. PROSTHODONTIC:  Patient denies having partial dentures. OCCLUSION:  Patient has a poor occlusal scheme secondary to multiple missing  teeth, multiple diastemas, and lack of replacement of missing teeth with dental prostheses.  RADIOGRAPHIC INTERPRETATION: Orthopantogram was taken and supplemented with 12 periapical radiographs and 4 bitewings. There are multiple missing teeth. There is incipient to moderate bone loss. There is evidence of maxillary incisal attrition.multiple diastemas are noted. There  is supra-eruption and drifting of the unopposed teeth into the edentulous areas. There is a questionable periapical radiolucency involving the distal root of tooth #3.   ASSESSMENTS: 1. Squamous cell carcinoma of the supraglottis 2. Pre-chemoradiation therapy dental protocol 3. Multiple flexure lesions 4. Maxillary incisal attrition 5. Chronic periodontitis with bone loss 6. Generalized gingival recession 7. Accretions 8.  Multiple missing teeth 9.  Multiple diastemas 10. Multiple rotated teeth are noted 11. Poor occlusal scheme and malocclusion 12. Questionable periapical radiolucency involving the distal root of tooth #3. However, the tooth tested positive with electric pulp testing and was        percussion and palpation negative.  PLAN/RECOMMENDATIONS: 1. I discussed the risks, benefits, and complications of various treatment options with the patient in relationship to her medical and dental conditions,  We discussed various treatment options to include no treatment, multiple extractions with alveoloplasty, pre-prosthetic surgery as indicated, periodontal therapy, dental restorations, root canal therapy, crown and bridge therapy, implant therapy, and replacement of missing teeth as indicated. We discussed impressions today for the fabrication of fluoride trays. We discussed referral to an oral surgeon for evaluation for extraction of tooth #3 and 13.  We discussed referral back to her primary dentist, Dr. Bonnee Quin, for periodontal therapy.  The patient currently wishes to NOT have any dental extractions at this time.   Patient understands that she may require a referral to an endodontist or oral surgeon if tooth numbers 3 and/or 13 become symptomatic. The patient did agree to proceed with impressions today for the fabrication of fluoride trays. The patient also agreed to see her primary dentist, Dr. Bonnee Quin, for periodontal therapy at this time. The dental cleaning appointment has been scheduled for September 16 at 3 PM with Dr. Bonnee Quin. The patient did have impressions today and tolerated procedure well. The patient is to return to Dental Medicine for insertion of fluoride trays at a later date. A prescription for fluoride therapy will be provided at the time of the insertion of fluoride trays.   2. Discussion of findings with medical team and coordination of future medical and dental care as needed.  I spent in excess of  120 minutes during the conduct of this consultation and >50% of this time involved direct face-to-face encounter for counseling and/or coordination of the patient's care.    Lenn Cal, DDS

## 2018-07-29 NOTE — Progress Notes (Signed)
Oncology Nurse Navigator Documentation  Met briefly with Ms. Krempasky prior to her PET, reintroduced myself as her navigator. Provided her appt schedule, noted 9/3 9:30 appt with Dr. Isidore Moos followed by 12:30 Dr. Enrique Sack, 9/6 1430 appt with Dr. Burr Medico.  I reiterated transportation being arranged by [facility] for these appts. She voiced understanding of information provided.  Gayleen Orem, RN, BSN Head & Neck Oncology Nurse [place] at [place] [phone]

## 2018-07-29 NOTE — Patient Instructions (Signed)

## 2018-07-30 ENCOUNTER — Telehealth: Payer: Self-pay | Admitting: *Deleted

## 2018-07-30 ENCOUNTER — Ambulatory Visit: Payer: Medicaid Other | Admitting: Radiation Oncology

## 2018-07-30 ENCOUNTER — Other Ambulatory Visit: Payer: Self-pay | Admitting: Radiation Oncology

## 2018-07-30 ENCOUNTER — Encounter: Payer: Self-pay | Admitting: Radiation Oncology

## 2018-07-30 ENCOUNTER — Encounter: Payer: Self-pay | Admitting: General Practice

## 2018-07-30 DIAGNOSIS — R5381 Other malaise: Secondary | ICD-10-CM

## 2018-07-30 DIAGNOSIS — Z1329 Encounter for screening for other suspected endocrine disorder: Secondary | ICD-10-CM

## 2018-07-30 DIAGNOSIS — C321 Malignant neoplasm of supraglottis: Secondary | ICD-10-CM

## 2018-07-30 DIAGNOSIS — C320 Malignant neoplasm of glottis: Secondary | ICD-10-CM

## 2018-07-30 NOTE — Telephone Encounter (Signed)
CALLED PATIENT TO INFORM OF LAB APPT. ON 08-01-18 @ 3:45 PM @ [facility], SPOKE WITH PATIENT AND SHE IS AWARE OF THIS LAB

## 2018-07-30 NOTE — Progress Notes (Signed)
[facility] Psychosocial Distress Screening Clinical Social Work  Clinical Social Work was referred by distress screening protocol.  The patient scored a 9 on the Psychosocial Distress Thermometer which indicates severe distress. Clinical Social Worker contacted patient by phone to assess for distress and other psychosocial needs. "A lot to digest and a lot to go through."  "Seems like a lot of running back and forth, Im really a homebody.  I'm real confused on the radiation."  Has been told different things about the number of times she has been told she will need to be here for radiation.  "How in the world do I make all those appointments, have a feeding tube, it feels completely overwhelming."  "I have not seen any success stories - everyone I know has died from cancer."  "Feels like a death sentence."  Does have a counselor, Kathrynn Speed, and will continue to work with her.  Gets Medicaid and SSI.  Has little family support, two daughters live nearby but are overwhelmed w their own family and work commitments.  Feels like "I have messed up their life plan", usually provides child care and transportation for daughters.    ONCBCN DISTRESS SCREENING 07/29/2018  Screening Type Initial Screening  Distress experienced in past week (1-10) 9  Emotional problem type Nervousness/Anxiety;Adjusting to illness    Clinical Social Worker follow up needed: Yes.    If yes, follow up plan:  Beverely Pace, [place], LCSW Clinical Social Worker Phone:  [phone]

## 2018-07-30 NOTE — Progress Notes (Signed)
Has armband been applied?  Yes  Does patient have an allergy to IV contrast dye?: No   Has patient ever received premedication for IV contrast dye?: N/A  Does patient take metformin?: No  If patient does take metformin when was the last dose: N/A  Date of lab work: 06/29/18 BUN: 7 CR: 0.43 EGFR: >60  IV site: Right AC  Has IV site been added to flowsheet?  Yes

## 2018-07-31 ENCOUNTER — Telehealth: Payer: Self-pay | Admitting: *Deleted

## 2018-07-31 ENCOUNTER — Other Ambulatory Visit: Payer: Self-pay | Admitting: Hematology

## 2018-07-31 NOTE — Progress Notes (Signed)
START ON PATHWAY REGIMEN - Head and Neck     A cycle is every 7 days:     Cisplatin   **Always confirm dose/schedule in your pharmacy ordering system**  Patient Characteristics: Larynx, Stage III, IVA, IVB; Unresectable Disease Classification: Larynx Current Disease Status: No Distant Metastases and No Recurrent Disease AJCC T Category: T3 AJCC 8 Stage Grouping: IVA AJCC N Category: cN2 AJCC M Category: M0 Intent of Therapy: Curative Intent, Discussed with Patient

## 2018-07-31 NOTE — Telephone Encounter (Signed)
Oncology Nurse Navigator Documentation  Spoke with Ms. Winkels, informed her of 9/9 12:15/1:00 CT SIM, 9/10 8:30 arrival for H&N [facility].  I explained transportation has been arranged.  She voiced understanding.  Confirmed her understanding of tomorrow afternoon's 2:30 appt with Dr. Burr Medico, explained arrival registration procedures.  Gayleen Orem, RN, BSN Head & Neck Oncology Nurse [place] at [place] [phone]

## 2018-08-01 ENCOUNTER — Ambulatory Visit: Payer: Medicaid Other

## 2018-08-01 ENCOUNTER — Inpatient Hospital Stay (HOSPITAL_BASED_OUTPATIENT_CLINIC_OR_DEPARTMENT_OTHER): Payer: Medicaid Other | Admitting: Hematology

## 2018-08-01 ENCOUNTER — Encounter: Payer: Self-pay | Admitting: *Deleted

## 2018-08-01 ENCOUNTER — Encounter: Payer: Self-pay | Admitting: Hematology

## 2018-08-01 VITALS — BP 120/87 | HR 87 | Temp 98.2°F | Resp 18 | Ht 63.0 in | Wt 183.4 lb

## 2018-08-01 DIAGNOSIS — J449 Chronic obstructive pulmonary disease, unspecified: Secondary | ICD-10-CM

## 2018-08-01 DIAGNOSIS — C321 Malignant neoplasm of supraglottis: Secondary | ICD-10-CM

## 2018-08-01 DIAGNOSIS — Z510 Encounter for antineoplastic radiation therapy: Secondary | ICD-10-CM | POA: Diagnosis not present

## 2018-08-01 DIAGNOSIS — Z79899 Other long term (current) drug therapy: Secondary | ICD-10-CM | POA: Insufficient documentation

## 2018-08-01 DIAGNOSIS — F329 Major depressive disorder, single episode, unspecified: Secondary | ICD-10-CM | POA: Diagnosis not present

## 2018-08-01 DIAGNOSIS — F17210 Nicotine dependence, cigarettes, uncomplicated: Secondary | ICD-10-CM | POA: Insufficient documentation

## 2018-08-01 MED ORDER — SERTRALINE HCL 50 MG PO TABS: 50.0000 mg | ORAL_TABLET | Freq: Every day | ORAL | 3 refills | Status: DC

## 2018-08-01 NOTE — Progress Notes (Addendum)
[place]  [phone] Fax:([phone] [place] Note   Patient Care Team: Vonna Drafts, FNP as PCP - General (Nurse Practitioner) Thornell Sartorius, MD as Referring Physician (Otolaryngology) Eppie Gibson, MD as Attending Physician (Radiation Oncology) Leota Sauers, RN as Oncology Nurse Navigator   Date of Service:  08/01/2018  CHIEF COMPLAINTS/PURPOSE OF CONSULTATION:  Malignant neoplasm of supraglottics      Malignant neoplasm of supraglottis ([place])   06/29/2018 Imaging    CT Soft tissue NECK W Contrast 06/29/18  IMPRESSION: 1.9 cm mass in the right piriform sinus worrisome for carcinoma. No evidence of adenopathy.    07/11/2018 Surgery    DIRECT LARYNGOSCOPY WITH BIOPSY by Dr. Lucia Gaskins  07/11/18     07/11/2018 Initial Biopsy    Diagnosis 07/11/18  Larynx, biopsy, right A.E fold supraglottic mass - SQUAMOUS CELL CARCINOMA. - SEE COMMENT.    07/24/2018 PET scan    PET 07/24/18  IMPRESSION: 1. Right supraglottic/piriformis sinus mass is markedly hypermetabolic and consistent with known squamous cell neoplasm. 2. Two small right supraclavicular nodes are hypermetabolic and consistent with nodal metastasis. 3. No findings for metastatic disease involving the chest, abdomen or pelvis. 4. Incidental findings as detailed above.    07/29/2018 Initial Diagnosis    Malignant neoplasm of supraglottis ([place])    07/30/2018 Cancer Staging    Staging form: Larynx - Supraglottis, AJCC 8th Edition - Clinical: Stage IVA (cT3, cN2b, cM0) - Signed by Eppie Gibson, MD on 07/30/2018    08/10/2018 -  Chemotherapy    The patient had PALONOSETRON HCL INJECTION 0.25 MG/5ML, 0.25 mg, Intravenous,  Once, 0 of 7 cycles CISplatin (PLATINOL) 77 mg in sodium chloride 0.9 % 250 mL chemo infusion, 40 mg/m2, Intravenous,  Once, 0 of 7 cycles FOSAPREPITANT 150MG  + DEXAMETHASONE INFUSION [facility], , Intravenous,  Once, 0 of 7 cycles  for chemotherapy treatment.       HISTORY OF PRESENTING ILLNESS:    Sheri Rivera 57 y.o. female is a here because of newly diagnosed cancer of the supraglottics. The patient was referred by Dr. Lucia Gaskins. The patient presents to the clinic today accompanied by GI Navigator Rick.   She notes this started with ear pain in beginning of 11/2017. She went to physicians and received antibiotics and steroids. She notes her ENT saw a polyp on her larynx but monitored in 01/2018 and monitored this. Further workup showed this to be suspicious for malignancy.   Today the patient notes still having ear pain. She denies current hearing loss. She has pain medication including hydrocodone and ibuprofen but does not like to take it although it can be managed when medication is used. She notes voice hoarseness. She has mild dysphagia with bread and french fries. She denies losing weight or having fever. She notes having a cough. She denies GI issues or bleeding.   Socially she lives alone and has 2 children and 1 adoptive child. She lives in the same complex as her son but he is a chronic alcoholic. She is not close to her children. Her grandson will be the one who can help her as needed. She notes she is depressed with her diagnosis and notes her social support has dwindled with this diagnosis. She has siblings in other states. She does not drink but she does smoke.   She has a PMHx of back pain and fibromyalgia and has received injections for this and been on pain medication long term and prednisone  before. She is not currently on prednisone. She has COPD and has had recurrent pneumonia and bronchitis. She uses inhaler and uses nebulizer at home. She has never been on continuous oxygen. She has had depression/anxiety since her 103s. She is on medication for this, Ativan. She no longer takes Zoloft because she ran out of refill. She notes her mother had small cell lung cancer in her 33s and passed from this.    REVIEW OF SYSTEMS:     Constitutional: Denies fevers, chills or abnormal night sweats Eyes: Denies blurriness of vision, double vision or watery eyes Ears, nose, mouth, throat, and face: Denies mucositis or sore throat (+) Ear pain (+) voice hoarseness (+) mild dysphagia  Respiratory: Denies cough, dyspnea or wheezes (+) COPD, productive cough  Cardiovascular: Denies palpitation, chest discomfort or lower extremity swelling Gastrointestinal:  Denies nausea, heartburn or change in bowel habits Skin: Denies abnormal skin rashes Lymphatics: Denies new lymphadenopathy or easy bruising Neurological:Denies numbness, tingling or new weaknesses MSK (+) chronic back pain, fibromyalgia  Behavioral/Psych: Mood is stable, no new changes  All other systems were reviewed with the patient and are negative.    MEDICAL HISTORY:  Past Medical History:  Diagnosis Date  . Anxiety 11/11/11  . Arthritis   . Asthma   . Bursitis    right shoulder  . Chronic back pain   . Chronic neck pain   . Chronic shoulder pain   . COPD (chronic obstructive pulmonary disease) ([place])    continues to smoke 1ppd  . DDD (degenerative disc disease)   . Depression   . Fibromyalgia   . GERD (gastroesophageal reflux disease)   . Heart murmur    as a child per pt  . Herpes   . IBS (irritable bowel syndrome)   . Lumbar radiculopathy   . Seasonal allergies   . Smoker   . Thyroid nodule    goiters    SURGICAL HISTORY: Past Surgical History:  Procedure Laterality Date  . CHOLECYSTECTOMY    . DIRECT LARYNGOSCOPY N/A 07/11/2018   Procedure: DIRECT LARYNGOSCOPY WITH BIOPSY;  Surgeon: Rozetta Nunnery, MD;  Location: [facility];  Service: ENT;  Laterality: N/A;  . HERNIA REPAIR     [place]   . HIATAL HERNIA REPAIR    . TUBAL LIGATION  1983    SOCIAL HISTORY: Social History   Socioeconomic History  . Marital status: Divorced    Spouse name: Not on file  . Number of children: 3  . Years of education: Not on file   . Highest education level: Not on file  Occupational History    Employer: [employer]  Social Needs  . Financial resource strain: Not on file  . Food insecurity:    Worry: Not on file    Inability: Not on file  . Transportation needs:    Medical: Not on file    Non-medical: Not on file  Tobacco Use  . Smoking status: Current Every Day Smoker    Packs/day: 1.50    Years: 27.00    Pack years: 40.50    Types: Cigarettes  . Smokeless tobacco: Never Used  Substance and Sexual Activity  . Alcohol use: No  . Drug use: No  . Sexual activity: Not on file  Lifestyle  . Physical activity:    Days per week: Not on file    Minutes per session: Not on file  . Stress: Not on file  Relationships  . Social connections:  Talks on phone: Not on file    Gets together: Not on file    Attends religious service: Not on file    Active member of club or organization: Not on file    Attends meetings of clubs or organizations: Not on file    Relationship status: Not on file  . Intimate partner violence:    Fear of current or ex partner: No    Emotionally abused: No    Physically abused: No    Forced sexual activity: No  Other Topics Concern  . Not on file  Social History Narrative   She lives by herself    FAMILY HISTORY: Family History  Problem Relation Age of Onset  . Lung cancer Mother   . Arthritis Mother        Rheumatoid  . Bursitis Mother   . Asthma Mother   . Heart attack Father 39       Died of MI  . Obesity Father   . Fibromyalgia Sister   . Asthma Child     ALLERGIES:  is allergic to clindamycin/lincomycin; cymbalta [duloxetine hcl]; levaquin [levofloxacin in d5w]; lyrica [pregabalin]; shellfish allergy; sulfamethoxazole; and codeine.  MEDICATIONS:  Current Outpatient Medications  Medication Sig Dispense Refill  . albuterol (PROVENTIL HFA;VENTOLIN HFA) 108 (90 BASE) MCG/ACT inhaler Inhale 2 puffs into the lungs every 6 (six) hours as needed for shortness of  breath. Shortness of breath    . albuterol (PROVENTIL) (2.5 MG/3ML) 0.083% nebulizer solution Take 3 mLs (2.5 mg total) by nebulization every 4 (four) hours as needed for wheezing or shortness of breath. Shortness of breath 25 vial 0  . calcium carbonate (TUMS - DOSED IN MG ELEMENTAL CALCIUM) 500 MG chewable tablet Chew 1 tablet by mouth daily as needed for indigestion or heartburn.    . fexofenadine-pseudoephedrine (ALLEGRA-D 24) 180-240 MG 24 hr tablet Take 1 tablet by mouth daily.    Marland Kitchen HYDROcodone-acetaminophen (NORCO/VICODIN) 5-325 MG tablet Take 1 tablet by mouth every 6 (six) hours as needed for severe pain. 40 tablet 0  . ibuprofen (ADVIL,MOTRIN) 600 MG tablet Take 600 mg by mouth 2 (two) times daily.    Marland Kitchen LORazepam (ATIVAN) 1 MG tablet Take 0.5 tablets (0.5 mg total) by mouth every 8 (eight) hours as needed for anxiety. 30 tablet 0  . simethicone (GAS-X) 80 MG chewable tablet Chew 160 mg by mouth daily.     . sertraline (ZOLOFT) 50 MG tablet Take 1 tablet (50 mg total) by mouth daily. 30 tablet 3   No current facility-administered medications for this visit.     PHYSICAL EXAMINATION: ECOG PERFORMANCE STATUS: 1 - Symptomatic but completely ambulatory  Vitals:   08/01/18 1456  BP: 120/87  Pulse: 87  Resp: 18  Temp: 98.2 F (36.8 C)  SpO2: 95%   Filed Weights   08/01/18 1456  Weight: 183 lb 6.4 oz (83.2 kg)    GENERAL:alert, no distress and comfortable SKIN: skin color, texture, turgor are normal, no rashes or significant lesions EYES: normal, conjunctiva are pink and non-injected, sclera clear OROPHARYNX:no exudate, no erythema and lips, buccal mucosa, and tongue normal  NECK: supple, thyroid normal size, non-tender, without nodularity  LYMPH:  no palpable lymphadenopathy in the axillary or inguinal (+) 1.5cm palpable right supraclavicular lymph node  LUNGS: clear to auscultation and percussion with normal breathing effort HEART: regular rate & rhythm and no murmurs and no  lower extremity edema ABDOMEN:abdomen soft, non-tender and normal bowel sounds Musculoskeletal:no cyanosis of digits and  no clubbing  PSYCH: alert & oriented x 3 with fluent speech NEURO: no focal motor/sensory deficits  LABORATORY DATA:  I have reviewed the data as listed CBC Latest Ref Rng & Units 06/29/2018 04/15/2018 08/03/2017  WBC 4.0 - 10.5 K/uL 9.0 8.4 7.5  Hemoglobin 12.0 - 15.0 g/dL 16.2(H) 16.3(H) 15.1(H)  Hematocrit 36.0 - 46.0 % 48.3(H) 49.8(H) 45.2  Platelets 150 - 400 K/uL 272 290 223    CMP Latest Ref Rng & Units 06/29/2018 04/15/2018 08/03/2017  Glucose 70 - 99 mg/dL 99 102(H) 137(H)  BUN 6 - 20 mg/dL 7 6 9   Creatinine 0.44 - 1.00 mg/dL 0.43(L) 0.60 0.48  Sodium 135 - 145 mmol/L 142 139 139  Potassium 3.5 - 5.1 mmol/L 3.8 3.7 3.6  Chloride 98 - 111 mmol/L 107 108 111  CO2 22 - 32 mmol/L 25 23 21(L)  Calcium 8.9 - 10.3 mg/dL 9.4 9.0 9.1  Total Protein 6.5 - 8.1 g/dL - 5.9(L) -  Total Bilirubin 0.3 - 1.2 mg/dL - 0.9 -  Alkaline Phos 38 - 126 U/L - 81 -  AST 15 - 41 U/L - 18 -  ALT 14 - 54 U/L - 16 -   PATHOLOGY  Diagnosis 07/11/18  Larynx, biopsy, right A.E fold supraglottic mass - SQUAMOUS CELL CARCINOMA. - SEE COMMENT. Microscopic Comment The carcinoma appears moderately differentiated. The vast majority of the carcinoma appears in situ, although there are microscopic foci suspicious for invasive disease. Dr. Vic Ripper has reviewed the case and concurs with this interpretation. Dr. Lucia Gaskins was paged on 07/14/18. Additional studies can be performed upon clinician request. (JBK:gt, 07/14/18)   RADIOGRAPHIC STUDIES: I have personally reviewed the radiological images as listed and agreed with the findings in the report. Nm Pet Image Initial (pi) Skull Base To Thigh  Result Date: 07/24/2018 CLINICAL DATA:  Initial treatment strategy for right supraglottic mass (squamous cell carcinoma). EXAM: NUCLEAR MEDICINE PET SKULL BASE TO THIGH TECHNIQUE: 8.42 mCi F-18 FDG was  injected intravenously. Full-ring PET imaging was performed from the skull base to thigh after the radiotracer. CT data was obtained and used for attenuation correction and anatomic localization. Fasting blood glucose: 95 mg/dl COMPARISON:  Neck CT 06/29/2018 FINDINGS: Mediastinal blood pool activity: SUV max 2.32 NECK: The right supraglottic lesion is markedly hypermetabolic with SUV max of 62.83. It does not definitely cross the midline or involve the true cords. There are 2 right-sided supraclavicular nodes both measuring 5.5 mm. These are hypermetabolic with SUV max of 1.51. No other neck adenopathy is identified. Incidental CT findings: Multinodular thyroid goiter. CHEST: No hypermetabolic mediastinal or hilar nodes. No suspicious pulmonary nodules on the CT scan. No enlarged or hypermetabolic axillary lymph nodes and no breast masses. Incidental CT findings: Severe emphysema and pulmonary scarring. Significant respiratory motion but no obvious pulmonary lesions. ABDOMEN/PELVIS: No abnormal hypermetabolic activity within the liver, pancreas, adrenal glands, or spleen. No hypermetabolic lymph nodes in the abdomen or pelvis. Incidental CT findings: Surgical changes at the GE junction. Status post cholecystectomy with mild biliary dilatation. Moderate scattered atherosclerotic calcifications involving the aorta and iliac arteries. Sigmoid diverticulosis without findings for acute diverticulitis. Moderate cystocele. SKELETON: No focal hypermetabolic activity to suggest skeletal metastasis. Incidental CT findings: none IMPRESSION: 1. Right supraglottic/piriformis sinus mass is markedly hypermetabolic and consistent with known squamous cell neoplasm. 2. Two small right supraclavicular nodes are hypermetabolic and consistent with nodal metastasis. 3. No findings for metastatic disease involving the chest, abdomen or pelvis. 4. Incidental findings as detailed above.  Electronically Signed   By: Marijo Sanes M.D.   On:  07/24/2018 10:06    CT Soft tissue NECK W Contrast 06/29/18  IMPRESSION: 1.9 cm mass in the right piriform sinus worrisome for carcinoma. No evidence of adenopathy.   ASSESSMENT & PLAN:  KIRBY ARGUETA is a 57 y.o. female with a history of Anxiety/depression, degenerative disk disease with chronic back pain, COPD, Fibromyalgia, GERD, and IBS.   1. Malignant Neoplasm of supraglottics, Squamous Cell carcinoma, cT3N2bM0, stage IVA -I discussed her image findings, biopsy results, and staging PET findings, we reviewed her PET scan images in person today.   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 -She was previously evaluated by ENT Dr. Lucia Gaskins, option of surgery and definitive radiation were discussed, and Dr. Lucia Gaskins felt she is a poor surgical candidate due to her COPD. -We discussed definitive chemoradiation for locally advanced supraglottic cancer, with overall good chance of cure.  If she has residual disease after chemo and radiation, laryngectomy is the salvage therapy. -I discussed the benefit of concurrent chemo in additional to radiation, which will increase overall survival and increase the Larynx preservation -Treatment options include concurrent chemoradiation with Cisplatin 100mg /m2 q3weeks, weekly Cisplatin, weekly Carbo/Taxol, Cetuximab or radiation alone.  I discussed each chemo agents in details. -Although she has moderate comorbidities, I think she is fit enough for more aggressive concurrent chemoradiation with high dose Cisplatin every 3 weeks for 3 cycles, which I recommended. I discussed side effects in great detail, including hair loss, nerve and hearing damage, renal failure, hemorrhagic cystitis and increased risk of infections. I provided her with reading material on this medication.  -She notes her quality of life is more important to her. I dicussed with this short term  treatment side effects she should be able to recover and live longer after treatment. She will think about treatment options.  -I dicussed the option of PAC Placement to for infection, labs or IV fluids. She will think about this as well.  -we discussed her social support, she has three children who live in local but could not help her much.  Unfortunately she has limited social support. -F/u open, she will call our navigator Liliane Channel about her decision.   2. COPD and smoking Cessation  -I dicussed her longterm history of smoking has contributed to her concern development.  -I have strongly encouraged her to stop smoking as to not worsen her condition and overall health. She understands and is willing to try.    3. Depression, Social support -She notes feeling depressed lately due to her cancer diagnosis. She has Ativan. She recently ran out of Zoloft and didn't return to PCP for refill. I refilled her Zoloft today (08/01/18) -She has low social support from family and friends. She does not have transportation currently. Will help her get [facility] transpiration. She has been using Lyft for now.  -GI Navigator Rick and I have discussed [facility] resources available to her.     PLAN:  -I refilled her Zoloft today  -I recommend concurrent chemotherapy with cisplatin 100 mg/m every 3 weeks with concurrent radiation, she will think about it.  She would need hearing test before chemo. -F/u open, she will call navigator week about her decision on chemo.    No orders of the defined types were placed in this encounter.   All questions were answered. The patient knows to call the clinic with any problems,  questions or concerns. I spent 55 minutes counseling the patient face to face. The total time spent in the appointment was 60 minutes and more than 50% was on counseling.     Truitt Merle, MD 08/01/2018 4:49 PM  I, Joslyn Devon, am acting as scribe for Truitt Merle, MD.   I have reviewed the above documentation  for accuracy and completeness, and I agree with the above.

## 2018-08-02 ENCOUNTER — Encounter: Payer: Self-pay | Admitting: Hematology

## 2018-08-03 NOTE — Progress Notes (Signed)
Oncology Nurse Navigator Documentation  Met with Ms. Monia Pouch during initial consult with Dr. Isidore Moos.  She was unaccompanied.    . Further introduced myself as her Navigator, explained my role as a member of the Care Team.   . Provided New Patient Information packet, discussed contents: o Contact information for physician(s), myself, other members of the Care Team. o Advance Directive information ([place] blue pamphlet with LCSW contact info) o Fall Prevention Patient Safety Plan o Appointment Guideline o [place] o [place] campus map with highlight of [place] o SLP information sheet o Symptom Management Clinic information . Provided introductory explanation of radiation treatment including SIM planning and purpose of Aquaplast head and shoulder mask, showed her example.  She reported claustrophobia, indicated preference for open-faced mask. . Provided and discussed education handouts for PEG.  She expressed resistance to idea. She reported: [facility]-coordinated transportation working well. Absence of family support.  Son is alcoholic, lives in same apartment complex but their interactions are minimal.  Has 2 dtrs who live in [place] but they work long hours, she has minimal contact with them.  She indicated her therapist is arranging family meeting to discuss her current situation.  She confirmed understanding she is meeting with [place] s/p this appt. Discussed next week Tuesday's H&N MDC and opportunity to meet with other clinicians to whom Dr. Isidore Moos is referring her. She agreed to attend, I provided 8:30 arrival, noted I will inform [place]   Currently smoking 1.5 ppd, had reduced to 1/2 ppd but is back to smoking 1.5 ppd d/t stress of dx and pending tmts.   . Provided tour of CT SIM/RT tmt area, explained registration/arrival procedures. . Escorted her to [facility] for her appt with Lauren.  She acknowledged understanding of 12:30  Dental Medicine appt and location of office. . She acknowledged understanding of Friday's appt with Dr. Burr Medico at which I will be joining her. I encouraged her to contact me with questions/concerns.  She verbalized understanding of information provided.    Navigator Interventions   Provided information/discussed opportunities for smoking cessation support:    Child psychotherapist class/individual counseling at [place] Transportation Coordinator Ginette Otto re transportation for H&N [place].  Explained Patient Mentoring program, offered to arrange mentor for additional support.  She expressed interest/agreement.  Delivered Anticipated [place] sheet prepared by Dr. Isidore Moos sheet to [place].  Gayleen Orem, RN, BSN Head & Neck Oncology Nurse [place] at [place] [phone]

## 2018-08-04 ENCOUNTER — Ambulatory Visit
Admission: RE | Admit: 2018-08-04 | Discharge: 2018-08-04 | Disposition: A | Discharge status: Home / Self Care | Payer: Medicaid Other | Source: Ambulatory Visit | Attending: Radiation Oncology | Admitting: Radiation Oncology

## 2018-08-04 ENCOUNTER — Telehealth: Payer: Self-pay | Admitting: *Deleted

## 2018-08-04 ENCOUNTER — Encounter (HOSPITAL_COMMUNITY): Payer: Self-pay | Admitting: Dentistry

## 2018-08-04 ENCOUNTER — Encounter: Payer: Self-pay | Admitting: *Deleted

## 2018-08-04 VITALS — BP 121/83 | HR 108 | Temp 97.7°F | Wt 183.2 lb

## 2018-08-04 DIAGNOSIS — Z510 Encounter for antineoplastic radiation therapy: Secondary | ICD-10-CM | POA: Diagnosis not present

## 2018-08-04 DIAGNOSIS — C321 Malignant neoplasm of supraglottis: Secondary | ICD-10-CM

## 2018-08-04 MED ORDER — SODIUM CHLORIDE 0.9% FLUSH
10.0000 mL | Freq: Once | INTRAVENOUS | Status: AC
  Administered 2018-08-04: 10 mL via INTRAVENOUS

## 2018-08-04 NOTE — Progress Notes (Signed)
Oncology Nurse Navigator Documentation  To provide support, encouragement and care continuity, met with Ms. Byers during her CT [facility]. She tolerated procedure wo/ difficulty, voiced understanding actual tmt time will be shorter. Toured her to [employer] 1 tmt area, explained lobby registration, arrival/preparation procedures. She voiced understanding of information provided. Confirmed her 0830 arrival tomorrow morning for H&N MDC.  Gayleen Orem, RN, BSN Head & Neck Oncology Nurse [place] at [place] [phone]

## 2018-08-04 NOTE — Telephone Encounter (Signed)
Oncology Nurse Navigator Documentation  Received call from Ms. Monia Pouch.  She reported:  Current thought is to proceed with RT only.  Requested this morning's appt with Dental Medicine for fitting of fluoride trays be rescheduled.  Dtrs joining her tomorrow for [place] to learn more about dx and tmts. Navigator Interventions  Answered questions re today's CT SIM.  Spoke with [place], requested reschedule as noted above.  Gayleen Orem, RN, BSN Head & Neck Oncology Nurse [place] at [place] [phone]

## 2018-08-04 NOTE — Progress Notes (Signed)
Oncology Nurse Navigator Documentation  Met with [name] during initial consult with Dr. Burr Medico.  She was unaccompanied. She voiced understanding of chemotherapy options, RT SE enhancements when given concurrently. I provided PAC education including educational hand-out, showed example. She voiced concern re ability to manage ADLs if she receives chemoRT b/c lives alone, family unavailable to help. We discussed Monday's 12:15 IV Start for 1:00 CT SIM, Tuesday morning's [place] attendance with 8:30 arrival. I encouraged her to call me with questions/concerns.  Gayleen Orem, RN, BSN Head & Neck Oncology Nurse [place] at [place] [phone]

## 2018-08-05 ENCOUNTER — Ambulatory Visit
Admission: RE | Admit: 2018-08-05 | Discharge: 2018-08-05 | Disposition: A | Discharge status: Home / Self Care | Payer: Medicaid Other | Source: Ambulatory Visit | Attending: Radiation Oncology | Admitting: Radiation Oncology

## 2018-08-05 ENCOUNTER — Encounter: Payer: Self-pay | Admitting: *Deleted

## 2018-08-05 ENCOUNTER — Inpatient Hospital Stay: Payer: Medicaid Other | Admitting: Nutrition

## 2018-08-05 ENCOUNTER — Ambulatory Visit: Payer: Medicaid Other | Attending: Radiation Oncology | Admitting: Physical Therapy

## 2018-08-05 ENCOUNTER — Ambulatory Visit: Payer: Medicaid Other

## 2018-08-05 ENCOUNTER — Other Ambulatory Visit: Payer: Self-pay

## 2018-08-05 VITALS — BP 107/76 | HR 66 | Temp 97.7°F | Resp 24 | Ht 63.0 in | Wt 183.4 lb

## 2018-08-05 DIAGNOSIS — G8929 Other chronic pain: Secondary | ICD-10-CM

## 2018-08-05 DIAGNOSIS — R2689 Other abnormalities of gait and mobility: Secondary | ICD-10-CM

## 2018-08-05 DIAGNOSIS — M545 Low back pain: Secondary | ICD-10-CM | POA: Diagnosis not present

## 2018-08-05 DIAGNOSIS — C321 Malignant neoplasm of supraglottis: Secondary | ICD-10-CM | POA: Diagnosis not present

## 2018-08-05 DIAGNOSIS — Z9189 Other specified personal risk factors, not elsewhere classified: Secondary | ICD-10-CM | POA: Diagnosis not present

## 2018-08-05 DIAGNOSIS — R1310 Dysphagia, unspecified: Secondary | ICD-10-CM

## 2018-08-05 DIAGNOSIS — M25611 Stiffness of right shoulder, not elsewhere classified: Secondary | ICD-10-CM | POA: Diagnosis not present

## 2018-08-05 DIAGNOSIS — R293 Abnormal posture: Secondary | ICD-10-CM

## 2018-08-05 DIAGNOSIS — Z1329 Encounter for screening for other suspected endocrine disorder: Secondary | ICD-10-CM

## 2018-08-05 DIAGNOSIS — M25612 Stiffness of left shoulder, not elsewhere classified: Secondary | ICD-10-CM

## 2018-08-05 DIAGNOSIS — R5381 Other malaise: Secondary | ICD-10-CM

## 2018-08-05 DIAGNOSIS — Z510 Encounter for antineoplastic radiation therapy: Secondary | ICD-10-CM | POA: Diagnosis not present

## 2018-08-05 LAB — TSH: TSH: 0.511 u[IU]/mL (ref 0.308–3.960)

## 2018-08-05 NOTE — Progress Notes (Signed)
Head and Neck Cancer Simulation, IMRT treatment planning note   Outpatient  Diagnosis:    ICD-10-CM   1. Malignant neoplasm of supraglottis ([place]) C32.1     The patient was taken to the CT simulator and laid in the supine position on the table. An Aquaplast head and shoulder mask was custom fitted to the patient's anatomy. High-resolution CT axial imaging was obtained of the head and neck with contrast. I verified that the quality of the imaging is good for treatment planning. 1 Medically Necessary Treatment Device was fabricated and supervised by me: Aquaplast mask.  Treatment planning note I plan to treat the patient with IMRT. I plan to treat the patient's tumor and bilateral neck nodes. I plan to treat to a total dose of 70 Gray in 35  fractions. Dose calculation was ordered from dosimetry.  IMRT planning Note  IMRT is medically necessary and an important modality to deliver adequate dose to the patient's at risk tissues while sparing the patient's normal structures, including the: esophagus, parotid tissue, mandible, brain stem, spinal cord, oral cavity, brachial plexus.  This justifies the use of IMRT in the patient's treatment.   -----------------------------------  Eppie Gibson, MD

## 2018-08-05 NOTE — Therapy (Signed)
[place], [place], [address] Phone: [phone]   Fax:  [phone]  Physical Therapy Evaluation  Patient Details  Name: Sheri Rivera MRN: [id number] Date of Birth: Mar 09, 1961 Referring Provider: Dr. Eppie Gibson   Encounter Date: 08/05/2018  PT End of Session - 08/05/18 1131    Visit Number  1    Number of Visits  1    PT Start Time  0925    PT Stop Time  1004    PT Time Calculation (min)  39 min    Activity Tolerance  Patient tolerated treatment well    Behavior During Therapy  [facility] for tasks assessed/performed       Past Medical History:  Diagnosis Date  . Anxiety 11/11/11  . Arthritis   . Asthma   . Bursitis    right shoulder  . Chronic back pain   . Chronic neck pain   . Chronic shoulder pain   . COPD (chronic obstructive pulmonary disease) ([place])    continues to smoke 1ppd  . DDD (degenerative disc disease)   . Depression   . Fibromyalgia   . GERD (gastroesophageal reflux disease)   . Heart murmur    as a child per pt  . Herpes   . IBS (irritable bowel syndrome)   . Lumbar radiculopathy   . Seasonal allergies   . Smoker   . Thyroid nodule    goiters    Past Surgical History:  Procedure Laterality Date  . CHOLECYSTECTOMY    . DIRECT LARYNGOSCOPY N/A 07/11/2018   Procedure: DIRECT LARYNGOSCOPY WITH BIOPSY;  Surgeon: Rozetta Nunnery, MD;  Location: [place];  Service: ENT;  Laterality: N/A;  . HERNIA REPAIR     Hiatal   . HIATAL HERNIA REPAIR    . TUBAL LIGATION  1983    There were no vitals filed for this visit.   Subjective Assessment - 08/05/18 1113    Subjective  I have DDD. I had three back injections in November last year and it has been worse since then.    Patient is accompained by:  Family member   daughter and son   Pertinent History  Diagnosis is supraglottic squamous cell carcinoma, stage IVA (cT3, cN2b, cM0). Plan is for RT  starting 9/17 in 35 fractions to tumor base and bilateral neck nodes. Pt. sounds today like she has decided against concurrent chemo. History includes anxiety, arthritis, bursitis, chronic back and neck pain, chronic shoulder pain, COPD, DDD, fibromyalgia, lumbar radiculopathy. Pt. reports a goiter.    Patient Stated Goals  get info from all clinic providers    Currently in Pain?  Yes    Pain Score  8     Pain Location  Back   and shoulders + other joints   Pain Descriptors / Indicators  Aching;Constant;Sharp    Pain Type  Chronic pain    Pain Onset  More than a month ago    Aggravating Factors   climbing stairs or inclines, standing up straight    Pain Relieving Factors  ibruprofen, hydrocodone         [facility] PT Assessment - 08/05/18 1118      Assessment   Medical Diagnosis  stage IVA supraglottic SCC    Referring Provider  Dr. Eppie Gibson    Onset Date/Surgical Date  07/10/18   approx.   Prior Therapy  none      Precautions   Precautions  Other (  comment)    Precaution Comments  cancer precautions      Restrictions   Weight Bearing Restrictions  No      Balance Screen   Has the patient fallen in the past 6 months  No    Has the patient had a decrease in activity level because of a fear of falling?   No    Is the patient reluctant to leave their home because of a fear of falling?   No      Home Environment   Living Environment  Private residence    Living Arrangements  Alone    Type of [place] to enter    Entrance Stairs-Number of Steps  2      Prior Function   Level of Independence  Independent   but ambulatory mobility limited due to pain   Vocation  On disability    Leisure  walks her two dogs outside short distances 6 times a day; does some gardening      Cognition   Overall Cognitive Status  Within Functional Limits for tasks assessed      Observation/Other Assessments   Observations  sits leaning left and says she always does  this      Coordination   Gross Motor Movements are Fluid and Coordinated  Yes      Functional Tests   Functional tests  Sit to Stand      Sit to Stand   Comments  4 times in 30 seconds, quitting before time was up due to pain   This is very poor for her age     Posture/Postural Control   Posture/Postural Control  Postural limitations    Postural Limitations  Forward head;Flexed trunk   also leans left; forward flexion is significan     ROM / Strength   AROM / PROM / Strength  AROM      AROM   Overall AROM Comments  Neck extension 25% limited, but other neck motions WFL. Shoulder AROM limited to approx. 120 degrees bilat.in flexion, 100 degrees in abduction; er okay, but patient refuses toperform ir      Ambulation/Gait   Ambulation/Gait  Yes    Ambulation/Gait Assistance  6: Modified independent (Device/Increase time)   she uses a wheeled walker on bad days; not today   Gait Pattern  Trunk flexed;Trendelenburg;Lateral trunk lean to left    Stairs  --   reports she can't do many steps due to back pain   Gait Comments  She will lean on a cart to get around a grocery store        LYMPHEDEMA/ONCOLOGY QUESTIONNAIRE - 08/05/18 1129      Type   Cancer Type  supraglottic squamous cell      Treatment   Active Radiation Treatment  Yes    Date  --   expected start 08/12/18   Body Site  tumor site, bilateral neck nodes      Lymphedema Assessments   Lymphedema Assessments  Head and Neck      Head and Neck   4 cm superior to sternal notch around neck  37 cm    6 cm superior to sternal notch around neck  38.3 cm    8 cm superior to sternal notch around neck  40.7 cm             Objective measurements completed on examination: See above findings.  PT Education - 08/05/18 1127    Education Details  neck ROM, posture, breathing, walking or water exercise program and modifications due to her health history, CURE article on staying active, "Why  exercise?" flyer, lymphedema and PT info; [employer] at the [employer]; mentioned availability of pulmonary rehab for her COPD    Person(s) Educated  Patient;Child(ren)    Methods  Explanation;Handout    Comprehension  Verbalized understanding              Head and Neck Clinic Goals - 08/05/18 1138      Patient will be able to verbalize understanding of a home exercise program for cervical range of motion, posture, and walking.    Status  Achieved      Patient will be able to verbalize understanding of proper sitting and standing posture.    Status  Achieved      Patient will be able to verbalize understanding of lymphedema risk and availability of treatment for this condition.    Status  Achieved         Plan - 08/05/18 1131    Clinical Impression Statement  Pt. is a 57 year-old with supraglottic squamous cell carcinoma, stage IVA. She expects to start radiation 08/12/18 and has decided against concurrent chemo. She has multiple musculoskeletal problems and limitations as well as back pain. Neck ROM slightly limited today and bilateral shoulder ROM significantly limited; very poor performance on 30 second sit to stand; gait abnormalities and limited endurance.    History and Personal Factors relevant to plan of care:  fibromyalgia, chronic low back pain, COPD    Clinical Presentation  Evolving    Clinical Presentation due to:  to start radiation treatment shortly    Clinical Decision Making  Moderate    Rehab Potential  Fair    PT Frequency  One time visit    PT Treatment/Interventions  Patient/family education    PT Next Visit Plan  no follow-up planned at this time, but as patient is at high risk for lymphedema, she may benefit from therapy should this develop with treatment    PT Home Exercise Plan  neck ROM; suggested pool exercise    Consulted and Agree with Plan of Care  Patient       Patient will benefit from skilled therapeutic intervention in order  to improve the following deficits and impairments:  Postural dysfunction, Abnormal gait, Decreased range of motion, Pain, Decreased mobility, Impaired UE functional use, Decreased activity tolerance  Visit Diagnosis: Squamous cell carcinoma of supraglottis ([place]) - Plan: PT plan of care cert/re-cert  Abnormal posture - Plan: PT plan of care cert/re-cert  Chronic low back pain, unspecified back pain laterality, with sciatica presence unspecified - Plan: PT plan of care cert/re-cert  Other abnormalities of gait and mobility - Plan: PT plan of care cert/re-cert  At risk for lymphedema - Plan: PT plan of care cert/re-cert  Stiffness of left shoulder, not elsewhere classified - Plan: PT plan of care cert/re-cert  Stiffness of right shoulder, not elsewhere classified - Plan: PT plan of care cert/re-cert     Problem List Patient Active Problem List   Diagnosis Date Noted  . Malignant neoplasm of supraglottis ([place]) 07/29/2018  . Acute respiratory failure with hypoxia ([place]) 08/03/2017  . GERD (gastroesophageal reflux disease) 06/13/2016  . Chronic obstructive pulmonary disease ([place]) 06/13/2016  . Current tobacco use 09/05/2015  . History of fracture of vertebra 02/16/2015  . Diffuse pain 04/03/2014  . Hemorrhage, postmenopausal  12/29/2013  . Chronic obstructive pulmonary disease with acute exacerbation ([place]) 10/30/2013  . COPD mixed type ([place]) 03/30/2013  . Abdominal pain, epigastric 02/25/2013  . Dysphagia, unspecified(787.20) 02/25/2013  . Gas 02/25/2013  . Abdominal bloating 02/25/2013  . Precordial pain 02/16/2013  . Multinodular goiter 10/05/2012  . 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 Bladder cystocele 07/21/2012  . Clinical depression 11/11/2011  . Allergic rhinitis 11/11/2011  . URI (upper respiratory infection) 09/28/2011  . Sore throat 07/18/2011  . Neck swelling 06/22/2011  . Ingrown right big toenail 05/23/2011  . Chronic back pain 03/16/2011  . GLOBUS  HYSTERICUS 12/05/2010  . Shortness of breath 03/15/2010  . GOITER, MULTINODULAR 07/01/2009  . FIBROMYALGIA 07/01/2009  . ALLERGIC RHINITIS CAUSE UNSPECIFIED 03/02/2008  . Anxiety 01/23/2007  . TOBACCO DEPENDENCE 01/23/2007  . GASTROESOPHAGEAL REFLUX, NO ESOPHAGITIS 01/23/2007    Elvena Oyer 08/05/2018, 11:40 AM  [place], [place], [address] Phone: [phone]   Fax:  [phone]  Name: Sheri Rivera MRN: [id number] Date of Birth: [birth year]  Serafina Royals, PT 08/05/18 11:40 AM

## 2018-08-05 NOTE — Patient Instructions (Signed)
SWALLOWING EXERCISES Do these 6 of the 7 days per week until 6 months after your last day of radiation, then 2 times per week afterwards  1. Effortful Swallows - Press your tongue against the roof of your mouth for 3 seconds, then squeeze the muscles in your neck while you swallow your saliva or a sip of water - Repeat 20 times, 2-3 times a day, and use whenever you eat or drink  2. Masako Swallow - swallow with your tongue sticking out - Stick tongue out past your teeth and gently bite tongue with your teeth - Swallow, while holding your tongue with your teeth - Repeat 20 times, 2-3 times a day *use a wet spoon if your mouth gets dry*  3. Pitch Raise - Repeat "he", once per second in as high of a pitch as you can - Repeat 20 times, 2-3 times a day  4. Shaker Exercise - head lift - Lie flat on your back in your bed or on a couch without pillows - Raise your head and look at your feet - KEEP YOUR SHOULDERS DOWN - HOLD FOR 45-60 SECONDS, then lower your head back down - Repeat 3 times, 2-3 times a day  5. Mendelsohn Maneuver - "half swallow" exercise - Start to swallow, and keep your Adam's apple up by squeezing hard with the            muscles of the throat - Hold the squeeze for 5-7 seconds and then relax - Repeat 20 times, 2-3 times a day *use a wet spoon if your mouth gets dry*  6. Breath Hold - Say "HUH!" loudly, then hold your breath for 3 seconds at your voice box - Repeat 20 times, 2-3 times a day  7. Chin pushback - Open your mouth  - Place your fist UNDER your chin near your neck, and push back with your fist for 5 seconds - Repeat 10 times, 2-3 times a day    

## 2018-08-05 NOTE — Progress Notes (Signed)
Patient was seen during head and neck clinic.  57 year old female diagnosed with laryngeal cancer.  She is a patient of Dr. Isidore Moos.  Past medical history includes COPD, tobacco, IBS, GERD, anxiety and depression.  Medications include Ativan and Gas-X.  Labs were reviewed.  Height: 5 feet 3 inches. Weight: 183.4 pounds on September 10. Usual body weight: 175-185 pounds. BMI: 32.49.  Patient reports she has refused chemotherapy.   She is to receive 35 radiation therapy treatments. Patient reports she has pain currently. She has brought her daughter and son with her today.  Nutrition diagnosis:  Predicted suboptimal energy intake related to laryngeal cancer and radiation therapy as evidenced by history of presence of a condition for which research shows an increased incidence of suboptimal energy intake.  Intervention: I educated patient to consume small frequent meals and snacks including adequate calories and protein for weight maintenance. Discouraged weight loss. Reviewed high-protein foods. Provided recipes in fact sheets. Questions were answered.  Teach back method used.  Contact information was provided.  Monitoring, evaluation, goals: Patient will tolerate adequate calories and protein to minimize weight loss throughout treatment.    Next visit: To be scheduled weekly with treatments.  **Disclaimer: This note was dictated with voice recognition software. Similar sounding words can inadvertently be transcribed and this note may contain transcription errors which may not have been corrected upon publication of note.**

## 2018-08-05 NOTE — Therapy (Signed)
[facility] [address] [place], [place], [address] Phone: [phone]   Fax:  [phone]  Speech Language Pathology Evaluation  Patient Details  Name: Sheri Rivera MRN: [id number] Date of Birth: 08-16-1961 Referring Provider: Eppie Gibson, MD   Encounter Date: 08/05/2018  End of Session - 08/05/18 1213    Visit Number  1    Number of Visits  7    Date for SLP Re-Evaluation  01/23/19    SLP Start Time  0945    SLP Stop Time   1030    SLP Time Calculation (min)  45 min    Activity Tolerance  Patient tolerated treatment well       Past Medical History:  Diagnosis Date  . Anxiety 11/11/11  . Arthritis   . Asthma   . Bursitis    right shoulder  . Chronic back pain   . Chronic neck pain   . Chronic shoulder pain   . COPD (chronic obstructive pulmonary disease) ([place])    continues to smoke 1ppd  . DDD (degenerative disc disease)   . Depression   . Fibromyalgia   . GERD (gastroesophageal reflux disease)   . Heart murmur    as a child per pt  . Herpes   . IBS (irritable bowel syndrome)   . Lumbar radiculopathy   . Seasonal allergies   . Smoker   . Thyroid nodule    goiters    Past Surgical History:  Procedure Laterality Date  . CHOLECYSTECTOMY    . DIRECT LARYNGOSCOPY N/A 07/11/2018   Procedure: DIRECT LARYNGOSCOPY WITH BIOPSY;  Surgeon: Rozetta Nunnery, MD;  Location: [place];  Service: ENT;  Laterality: N/A;  . HERNIA REPAIR     Hiatal   . HIATAL HERNIA REPAIR    . TUBAL LIGATION  1983    There were no vitals filed for this visit.  Subjective Assessment - 08/05/18 1022    Subjective  Pt endorses that drier foods are slihgtly more difficult to swallow now than before CA diagnosis.    Patient is accompained by:  Family member   son, daughter   Currently in Pain?  No/denies    Pain Score  8     Pain Location  Throat    Pain Orientation  Right         SLP  Evaluation [facility] - 08/05/18 1022      SLP Visit Information   SLP Received On  08/05/18    Referring Provider  Eppie Gibson, MD    Onset Date  December 2018    Medical Diagnosis  Supraglottic CA      General Information   HPI  57 y/o female with supraglottic SCC - rad begins 08-12-18, bilateral neck nodes. PMH inculdes anxiety, arthritis, chronic back pain, COPD, fibromyalgia. 1 1/2 PPD smoker.       Prior Functional Status   Cognitive/Linguistic Baseline  Within functional limits      Cognition   Overall Cognitive Status  Within Functional Limits for tasks assessed      Auditory Comprehension   Overall Auditory Comprehension  Appears within functional limits for tasks assessed      Verbal Expression   Overall Verbal Expression  Appears within functional limits for tasks assessed      Oral Motor/Sensory Function   Overall Oral Motor/Sensory Function  Appears within functional limits for tasks assessed      Motor Speech   Overall Motor  Speech  Appears within functional limits for tasks assessed    Phonation  Hoarse   mild hoarseness    Pt currently tolerates softer-type diet with thin liquids. Pt endorses the fact she has more difficulty with pharyngeal clearance with drier food items but has not stopped or refrained from eating them. She is using more water when eating them. Pt drinks 4-5 mugs of coffee (caffeinated) each day. When told this could likely be to blame for her hoarse voice (which pt stated she had) she let SLP know she was not going to cont to drink caffeinated beverages whether it was coffee or not.  POs: Pt ate two bites of [place] sandwich with encouragement ("I don't usually fill my belly with any food this early."), and with reported impeded pharyngeal clearance. "I had to get it round that boiled egg in there," pt stated. "That's what it feels like - a boiled egg back there."  Thyroid elevation appeared adequate, and swallows appeared timely. Oral residue noted as  minimal/none. Pt's swallow deemed mild- moderately impaired at this time.   Because data states the risk for dysphagia during and after radiation treatment is high due to undergoing radiation tx, SLP taught pt about the possibility of reduced/limited ability for PO intake during rad tx. SLP encouraged pt to continue swallowing POs as far into rad tx as possible, even ingesting POs and/or completing HEP shortly after administration of pain meds or completing portions of HEP that do not require swallowing.   SLP educated pt re: changes to swallowing musculature after rad tx, and why adherence to dysphagia HEP provided today and PO consumption was necessary to inhibit muscular disuse atrophy and to reduce muscle fibrosis following rad tx. Pt demonstrated understanding of these things to SLP.    SLP then developed a HEP for pt and pt was instructed how to perform exercises involving lingual, vocal, and pharyngeal strengthening. SLP performed each exercise and pt return demonstrated each exercise. SLP ensured pt performance was correct prior to moving on to next exercise. Pt was instructed to complete this program 2-3 times a day, 6-7 days/week until 6 months after his or her last rad tx, then x2 a week after that.                  SLP Education - 08/05/18 1209    Education Details  HEP procedure, late effects head/neck rad on swallowing, what is SLP role in H/N radiation tx    Person(s) Educated  Patient;Child(ren)    Methods  Explanation;Demonstration;Verbal cues    Comprehension  Verbal cues required;Returned demonstration;Verbalized understanding;Need further instruction       SLP Short Term Goals - 08/05/18 1217      SLP SHORT TERM GOAL #1   Title  pt will complete HEP with rare min A over two sessions    Baseline   total A    Time  2    Period  --   sessions   Status  New      SLP SHORT TERM GOAL #2   Title  pt will tell SLP why she is completing HEP     Baseline  total A     Time  1    Period  --   session   Status  New       SLP Long Term Goals - 08/05/18 1219      SLP LONG TERM GOAL #1   Title  pt will complete HEP with modified  independence over two sessions    Baseline  total A    Time  4    Period  --   sessions   Status  New      SLP LONG TERM GOAL #2   Title  pt will tell SLP 3 overt s/s of aspiration PNA with modified independence    Baseline  not provided yet    Time  4    Period  --   sessions   Status  New      SLP LONG TERM GOAL #3   Title  pt will tell SLP 3 overt s/s of aspiration PNA with modified independence    Baseline  total A    Time  6    Period  --   sessions   Status  New       Plan - 08/05/18 1228    Clinical Impression Statement  Pt with currently what appears to be mild to mod (depending on food ingested) pharyngeal dysphagia. However, the probability of swallowing difficulty increases dramatically with the initiation of radiation therapy. Pt will need to be followed by SLP for regular assessment of accurate HEP completion as well as for safety with POs both during and following treatment/s.    Speech Therapy Frequency  --   approx once every four weeks   Duration  --   6 sessions   Treatment/Interventions  Aspiration precaution training;Pharyngeal strengthening exercises;Diet toleration management by SLP;Trials of upgraded texture/liquids;Internal/external aids;Patient/family education;Compensatory strategies;SLP instruction and feedback;Environmental controls    Potential to Achieve Goals  Fair    Potential Considerations  Co-morbidities;Family/community support   (high level of anxiety)   SLP Home Exercise Plan  provided today    Consulted and Agree with Plan of Care  Patient       Patient will benefit from skilled therapeutic intervention in order to improve the following deficits and impairments:   Dysphagia, unspecified type - Plan: SLP plan of care cert/re-cert    Problem List Patient Active  Problem List   Diagnosis Date Noted  . Malignant neoplasm of supraglottis ([place]) 07/29/2018  . Acute respiratory failure with hypoxia ([place]) 08/03/2017  . GERD (gastroesophageal reflux disease) 06/13/2016  . Chronic obstructive pulmonary disease ([place]) 06/13/2016  . Current tobacco use 09/05/2015  . History of fracture of vertebra 02/16/2015  . Diffuse pain 04/03/2014  . Hemorrhage, postmenopausal 12/29/2013  . Chronic obstructive pulmonary disease with acute exacerbation ([place]) 10/30/2013  . COPD mixed type ([place]) 03/30/2013  . Abdominal pain, epigastric 02/25/2013  . Dysphagia, unspecified(787.20) 02/25/2013  . Gas 02/25/2013  . Abdominal bloating 02/25/2013  . Precordial pain 02/16/2013  . Multinodular goiter 10/05/2012  . 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 Bladder cystocele 07/21/2012  . Clinical depression 11/11/2011  . Allergic rhinitis 11/11/2011  . URI (upper respiratory infection) 09/28/2011  . Sore throat 07/18/2011  . Neck swelling 06/22/2011  . Ingrown right big toenail 05/23/2011  . Chronic back pain 03/16/2011  . 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/05/2010  . Shortness of breath 03/15/2010  . GOITER, MULTINODULAR 07/01/2009  . FIBROMYALGIA 07/01/2009  . ALLERGIC RHINITIS CAUSE UNSPECIFIED 03/02/2008  . Anxiety 01/23/2007  . TOBACCO DEPENDENCE 01/23/2007  . GASTROESOPHAGEAL REFLUX, NO ESOPHAGITIS 01/23/2007    [facility] ,MS, CCC-SLP  08/05/2018, 12:29 PM  [place] [address] [place], [place], [address] Phone: [phone]   Fax:  [phone]  Name: Sheri Rivera MRN: [id number] Date of Birth: 14-May-1961

## 2018-08-06 NOTE — Progress Notes (Signed)
Oncology Nurse Navigator Documentation  Met with Sheri Rivera upon her arrival for H&N [place].  She was accompanied by dtr and son.  Provided verbal and written overview of [facility], the clinicians who will be seeing her, encouraged her to ask questions during her time with them.  She was seen by Nutrition, SLP, PT and SW.   S/p [place] she proceeded to lobby to register for labs. I encouraged her to call me with needs/concerns.  Sheri Orem, RN, BSN Head & Neck Oncology [place] at [place] [phone]

## 2018-08-07 ENCOUNTER — Telehealth: Payer: Self-pay | Admitting: *Deleted

## 2018-08-07 NOTE — Telephone Encounter (Signed)
Oncology Nurse Navigator Documentation  In follow-up to discussion at [place] H&N [place], spoke with Ms Dippolito re her decision regarding inclusion of chemotherapy with RT.  She cited her understanding of enhanced RT SEs with chemo, her solo living situation, lack of support from family/freinds, and the possibility of hospitalization as factors in her decision.  She voiced understanding I will inform Dr. Burr Medico.  Gayleen Orem, RN, BSN Head & Neck Oncology Nurse [place] at [place] [phone]

## 2018-08-08 ENCOUNTER — Telehealth: Payer: Self-pay | Admitting: *Deleted

## 2018-08-08 NOTE — Telephone Encounter (Signed)
Oncology Nurse Navigator Documentation  In follow-up to [name] expressed interest in having a patient mentor, called her to provide name, phone # and general information re mentor assignment, emphasized confidentiality of relationship.  She voiced understanding.  Gayleen Orem, RN, BSN Head & Neck Oncology Nurse [place] at [place] [phone]

## 2018-08-11 DIAGNOSIS — Z510 Encounter for antineoplastic radiation therapy: Secondary | ICD-10-CM | POA: Diagnosis not present

## 2018-08-11 DIAGNOSIS — C321 Malignant neoplasm of supraglottis: Secondary | ICD-10-CM | POA: Diagnosis not present

## 2018-08-12 ENCOUNTER — Encounter: Payer: Self-pay | Admitting: *Deleted

## 2018-08-12 ENCOUNTER — Encounter: Payer: Self-pay | Admitting: Nutrition

## 2018-08-12 ENCOUNTER — Ambulatory Visit
Admission: RE | Admit: 2018-08-12 | Discharge: 2018-08-12 | Disposition: A | Discharge status: Home / Self Care | Payer: Medicaid Other | Source: Ambulatory Visit | Attending: Radiation Oncology | Admitting: Radiation Oncology

## 2018-08-12 DIAGNOSIS — Z510 Encounter for antineoplastic radiation therapy: Secondary | ICD-10-CM | POA: Diagnosis not present

## 2018-08-12 DIAGNOSIS — C321 Malignant neoplasm of supraglottis: Secondary | ICD-10-CM | POA: Diagnosis not present

## 2018-08-12 MED ORDER — RADIAPLEXRX EX GEL
Freq: Once | CUTANEOUS | Status: AC
  Administered 2018-08-12: 12:00:00 via TOPICAL

## 2018-08-12 NOTE — Progress Notes (Signed)
Pt here for patient teaching.  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  Reviewed areas of pertinence such as fatigue, mouth changes, skin changes, throat changes and taste changes . Pt able to give teach back of to pat skin, use unscented/gentle soap and drink plenty of water,apply Radiaplex bid and avoid applying anything to skin within 4 hours of treatment. Pt verbalizes understanding of information given and will contact nursing with any questions or concerns.   She was given radiaplex to use due to a shellfish allergy and contraindication with sonafine.    Http://rtanswers.org/treatmentinformation/whattoexpect/index

## 2018-08-13 ENCOUNTER — Ambulatory Visit
Admission: RE | Admit: 2018-08-13 | Discharge: 2018-08-13 | Disposition: A | Discharge status: Home / Self Care | Payer: Medicaid Other | Source: Ambulatory Visit | Attending: Radiation Oncology | Admitting: Radiation Oncology

## 2018-08-13 DIAGNOSIS — C321 Malignant neoplasm of supraglottis: Secondary | ICD-10-CM | POA: Diagnosis not present

## 2018-08-13 DIAGNOSIS — Z510 Encounter for antineoplastic radiation therapy: Secondary | ICD-10-CM | POA: Diagnosis not present

## 2018-08-13 NOTE — Progress Notes (Signed)
Oncology Nurse Navigator Documentation  To provide support, encouragement and care continuity, met with Ms. Zillmer for her initial  RT.  She was accompanied by a friend and her patient mentor.  I reviewed the 2-step treatment process, answered questions.   She completed treatment without difficulty, denied questions/concerns.  I reviewed the registration/arrival procedure for subsequent treatments. I encouraged her to call me with questions/concerns as tmts proceed. She voiced understanding.  Gayleen Orem, RN, BSN Head & Neck Oncology Nurse [place] at [place] [phone]

## 2018-08-14 ENCOUNTER — Encounter: Payer: Self-pay | Admitting: Radiation Oncology

## 2018-08-14 ENCOUNTER — Ambulatory Visit
Admission: RE | Admit: 2018-08-14 | Discharge: 2018-08-14 | Disposition: A | Discharge status: Home / Self Care | Payer: Medicaid Other | Source: Ambulatory Visit | Attending: Radiation Oncology | Admitting: Radiation Oncology

## 2018-08-14 DIAGNOSIS — Z510 Encounter for antineoplastic radiation therapy: Secondary | ICD-10-CM | POA: Diagnosis not present

## 2018-08-14 DIAGNOSIS — C321 Malignant neoplasm of supraglottis: Secondary | ICD-10-CM | POA: Diagnosis not present

## 2018-08-15 ENCOUNTER — Ambulatory Visit: Payer: Medicaid Other

## 2018-08-18 ENCOUNTER — Ambulatory Visit
Admission: RE | Admit: 2018-08-18 | Discharge: 2018-08-18 | Disposition: A | Discharge status: Home / Self Care | Payer: Medicaid Other | Source: Ambulatory Visit | Attending: Radiation Oncology | Admitting: Radiation Oncology

## 2018-08-18 DIAGNOSIS — Z510 Encounter for antineoplastic radiation therapy: Secondary | ICD-10-CM | POA: Diagnosis not present

## 2018-08-18 DIAGNOSIS — C321 Malignant neoplasm of supraglottis: Secondary | ICD-10-CM | POA: Diagnosis not present

## 2018-08-19 ENCOUNTER — Other Ambulatory Visit: Payer: Self-pay | Admitting: Radiation Oncology

## 2018-08-19 ENCOUNTER — Ambulatory Visit
Admission: RE | Admit: 2018-08-19 | Discharge: 2018-08-19 | Disposition: A | Discharge status: Home / Self Care | Payer: Medicaid Other | Source: Ambulatory Visit | Attending: Radiation Oncology | Admitting: Radiation Oncology

## 2018-08-19 DIAGNOSIS — Z510 Encounter for antineoplastic radiation therapy: Secondary | ICD-10-CM | POA: Diagnosis not present

## 2018-08-19 DIAGNOSIS — C321 Malignant neoplasm of supraglottis: Secondary | ICD-10-CM | POA: Diagnosis not present

## 2018-08-19 MED ORDER — LIDOCAINE VISCOUS HCL 2 % MT SOLN: OROMUCOSAL | 5 refills | Status: DC

## 2018-08-19 MED FILL — SERTRALINE HCL 50 MG TABLET: 50 | 30 days supply | Qty: 30 | Fill #0

## 2018-08-19 MED FILL — LIDOCAINE 2% VISCOUS SOLN: 2 | 3 days supply | Qty: 100 | Fill #0

## 2018-08-20 ENCOUNTER — Telehealth: Payer: Self-pay | Admitting: *Deleted

## 2018-08-20 ENCOUNTER — Ambulatory Visit: Payer: Medicaid Other

## 2018-08-20 ENCOUNTER — Encounter: Payer: Self-pay | Admitting: Radiation Oncology

## 2018-08-20 NOTE — Telephone Encounter (Signed)
Oncology Nurse Navigator Documentation  Rec'd call from Sheri Rivera informing she is cancelling today's RT.  She is with family at [facility] where her ex-husband  is in ICU and is EOL.  LINAC 1 and Dr. Isidore Moos informed.  Gayleen Orem, RN, BSN Head & Neck Oncology Nurse [place] at [place] [phone]

## 2018-08-21 ENCOUNTER — Inpatient Hospital Stay: Payer: Medicaid Other | Admitting: Nutrition

## 2018-08-21 ENCOUNTER — Telehealth: Payer: Self-pay | Admitting: *Deleted

## 2018-08-21 ENCOUNTER — Telehealth: Payer: Self-pay | Admitting: Radiation Oncology

## 2018-08-21 ENCOUNTER — Ambulatory Visit
Admission: RE | Admit: 2018-08-21 | Discharge: 2018-08-21 | Disposition: A | Discharge status: Home / Self Care | Payer: Medicaid Other | Source: Ambulatory Visit | Attending: Radiation Oncology | Admitting: Radiation Oncology

## 2018-08-21 DIAGNOSIS — Z510 Encounter for antineoplastic radiation therapy: Secondary | ICD-10-CM | POA: Diagnosis not present

## 2018-08-21 DIAGNOSIS — C321 Malignant neoplasm of supraglottis: Secondary | ICD-10-CM | POA: Diagnosis not present

## 2018-08-21 NOTE — Telephone Encounter (Signed)
Called pt regarding her unconfirmed ride for today - told her to call me back so I can have her come in for her treatment.

## 2018-08-21 NOTE — Telephone Encounter (Signed)
Oncology Nurse Navigator Documentation  Spoke with [name].  She confirmed she will arrive this afternoon for tmt.  LINAC 1 notified.  Gayleen Orem, RN, BSN Head & Neck Oncology Nurse [place] at [place] [phone]

## 2018-08-22 ENCOUNTER — Telehealth: Payer: Self-pay | Admitting: *Deleted

## 2018-08-22 ENCOUNTER — Ambulatory Visit: Payer: Medicaid Other

## 2018-08-22 NOTE — Telephone Encounter (Signed)
Oncology Nurse Navigator Documentation  Rec'd call from Ms Asbill cancelling today's XRT appt.  She indicated ex-husband was taken off life-support last evening, died shortly thereafter.  She and family are involved with funeral arrangements today.  We agreed I will call her Monday morning re scheduled tmt.  LINAC 1, Dr. Isidore Moos and Transportation Coordinator notified.  Gayleen Orem, RN, BSN Head & Neck Oncology Nurse [place] at [place] [phone]

## 2018-08-25 ENCOUNTER — Telehealth: Payer: Self-pay | Admitting: *Deleted

## 2018-08-25 ENCOUNTER — Other Ambulatory Visit: Payer: Self-pay | Admitting: Radiation Oncology

## 2018-08-25 ENCOUNTER — Encounter: Payer: Self-pay | Admitting: *Deleted

## 2018-08-25 ENCOUNTER — Ambulatory Visit
Admission: RE | Admit: 2018-08-25 | Discharge: 2018-08-25 | Disposition: A | Discharge status: Home / Self Care | Payer: Medicaid Other | Source: Ambulatory Visit | Attending: Radiation Oncology | Admitting: Radiation Oncology

## 2018-08-25 DIAGNOSIS — Z510 Encounter for antineoplastic radiation therapy: Secondary | ICD-10-CM | POA: Diagnosis not present

## 2018-08-25 DIAGNOSIS — C321 Malignant neoplasm of supraglottis: Secondary | ICD-10-CM

## 2018-08-25 MED ORDER — HYDROCODONE-ACETAMINOPHEN 7.5-325 MG/15ML PO SOLN: 5.0000 mL | ORAL | 0 refills | Status: DC | PRN

## 2018-08-25 MED ORDER — SCOPOLAMINE 1 MG/3DAYS TD PT72: 1.0000 | MEDICATED_PATCH | TRANSDERMAL | 0 refills | Status: DC

## 2018-08-25 NOTE — Telephone Encounter (Signed)
Oncology Nurse Navigator Documentation  Received call from Ms. Bresee confirming she will arrive for this afternoon's XRT.  She reported/expressed concern she "strangled" on baked chicken and a hydrocodone over the weekend, drank coffee that "went down the wrong pipe, I coughed it up".  I indicated she can discuss these concerns with Dr. Isidore Moos today during PUT, she voiced understanding.  Gayleen Orem, RN, BSN Head & Neck Oncology Nurse [place] at [place] [phone]

## 2018-08-26 ENCOUNTER — Ambulatory Visit
Admission: RE | Admit: 2018-08-26 | Discharge: 2018-08-26 | Disposition: A | Discharge status: Home / Self Care | Payer: Medicaid Other | Source: Ambulatory Visit | Attending: Radiation Oncology | Admitting: Radiation Oncology

## 2018-08-26 DIAGNOSIS — Z510 Encounter for antineoplastic radiation therapy: Secondary | ICD-10-CM | POA: Diagnosis not present

## 2018-08-26 DIAGNOSIS — C321 Malignant neoplasm of supraglottis: Secondary | ICD-10-CM | POA: Diagnosis not present

## 2018-08-26 NOTE — Progress Notes (Signed)
Oncology Nurse Navigator Documentation  To provide support, encouragement and care continuity, met with [name] following XRT/during weekly PUT with Dr. Isidore Moos.  She was accompanied by her patient mentor. She reported:  Ex-husband's funeral is this Friday early afternoon, she doubts she will keep RT appt scheduled for 3:45, indicated "I'm an emotional wreck right now, can't think about tmt" that day, stated tearfully "my kids need me, they've never been through this before".  Yesterday choked on piece of chicken, half a hydrocodone tablet, coffee, attributing episodes to thickened saliva "in back of my throat".  Trying to adjust food choices to minimize swallowing issues she attributes to thick saliva.  Cold drinks cause her throat to "burn".  Received nicotine patches, lozenges and "gum" from [facility]. Dr. Isidore Moos Rxed scopolamine and ordered younker suction for secretion managment, Hycet for throat pain. Navigator Interventions Encouraged her to consider a morning XRT appt on Friday, noted therapists will work with her to accommodate.  Encouraged her to eat high protein soft foods eg scrambled eggs with cheese, Greek yogurt; mashed potatoes with gravy, oatmeal with butter. Reinforced Dr. Pearlie Oyster encouragement to drink room temperature Ensure x3 daily. Encouraged her to keep food diary to track results of food choices. Encouraged her to contact me with needs/concerns.  Gayleen Orem, RN, BSN Head & Neck Oncology Nurse [place] at [place] [phone]

## 2018-08-27 ENCOUNTER — Ambulatory Visit: Payer: Medicaid Other

## 2018-08-27 ENCOUNTER — Telehealth: Payer: Self-pay | Admitting: *Deleted

## 2018-08-27 DIAGNOSIS — C321 Malignant neoplasm of supraglottis: Secondary | ICD-10-CM | POA: Diagnosis not present

## 2018-08-27 NOTE — Telephone Encounter (Signed)
Oncology Nurse Navigator Documentation  Notified by Transportation Coordinator Sheri Rivera that [name] did not show for her ride for 3:45 XRT appt.  Sheri Rivera called pt, LVMM to check on well-being as did I.  Spoke with RTT Sheri Rivera who indicated their availability to treat if she arrives by 4:20.  Sheri Orem, RN, BSN Head & Neck Oncology Nurse [place] at [place] [phone]

## 2018-08-28 ENCOUNTER — Inpatient Hospital Stay: Payer: Medicaid Other | Attending: Radiation Oncology | Admitting: Nutrition

## 2018-08-28 ENCOUNTER — Ambulatory Visit
Admission: RE | Admit: 2018-08-28 | Discharge: 2018-08-28 | Disposition: A | Discharge status: Home / Self Care | Payer: Medicaid Other | Source: Ambulatory Visit | Attending: Radiation Oncology | Admitting: Radiation Oncology

## 2018-08-28 DIAGNOSIS — Z923 Personal history of irradiation: Secondary | ICD-10-CM | POA: Insufficient documentation

## 2018-08-28 DIAGNOSIS — F17210 Nicotine dependence, cigarettes, uncomplicated: Secondary | ICD-10-CM | POA: Insufficient documentation

## 2018-08-28 DIAGNOSIS — Z510 Encounter for antineoplastic radiation therapy: Secondary | ICD-10-CM | POA: Diagnosis not present

## 2018-08-28 DIAGNOSIS — Z9221 Personal history of antineoplastic chemotherapy: Secondary | ICD-10-CM | POA: Insufficient documentation

## 2018-08-28 DIAGNOSIS — J0110 Acute frontal sinusitis, unspecified: Secondary | ICD-10-CM | POA: Insufficient documentation

## 2018-08-28 DIAGNOSIS — C321 Malignant neoplasm of supraglottis: Secondary | ICD-10-CM | POA: Diagnosis not present

## 2018-08-29 ENCOUNTER — Ambulatory Visit
Admission: RE | Admit: 2018-08-29 | Discharge: 2018-08-29 | Disposition: A | Discharge status: Home / Self Care | Payer: Medicaid Other | Source: Ambulatory Visit | Attending: Radiation Oncology | Admitting: Radiation Oncology

## 2018-08-29 DIAGNOSIS — C321 Malignant neoplasm of supraglottis: Secondary | ICD-10-CM | POA: Diagnosis not present

## 2018-08-29 DIAGNOSIS — Z510 Encounter for antineoplastic radiation therapy: Secondary | ICD-10-CM | POA: Diagnosis not present

## 2018-09-01 ENCOUNTER — Other Ambulatory Visit: Payer: Self-pay | Admitting: Radiation Oncology

## 2018-09-01 ENCOUNTER — Ambulatory Visit
Admission: RE | Admit: 2018-09-01 | Discharge: 2018-09-01 | Disposition: A | Discharge status: Home / Self Care | Payer: Medicaid Other | Source: Ambulatory Visit | Attending: Radiation Oncology | Admitting: Radiation Oncology

## 2018-09-01 DIAGNOSIS — Z510 Encounter for antineoplastic radiation therapy: Secondary | ICD-10-CM | POA: Diagnosis not present

## 2018-09-01 DIAGNOSIS — C321 Malignant neoplasm of supraglottis: Secondary | ICD-10-CM

## 2018-09-01 MED ORDER — ENSURE HIGH PROTEIN PO LIQD: 1.0000 | ORAL | 20 refills | Status: DC | PRN

## 2018-09-01 MED FILL — ENSURE HIGH PROTEIN LIQD: 36 days supply | Qty: 11376 | Fill #0

## 2018-09-02 ENCOUNTER — Inpatient Hospital Stay: Payer: Self-pay | Admitting: Nutrition

## 2018-09-02 ENCOUNTER — Ambulatory Visit: Payer: Medicaid Other

## 2018-09-02 ENCOUNTER — Encounter: Payer: Self-pay | Admitting: Nutrition

## 2018-09-02 NOTE — Progress Notes (Signed)
Patient cancelled nutrition appointment. She is scheduled for follow up on Oct 17th.

## 2018-09-03 ENCOUNTER — Ambulatory Visit
Admission: RE | Admit: 2018-09-03 | Discharge: 2018-09-03 | Disposition: A | Discharge status: Home / Self Care | Payer: Medicaid Other | Source: Ambulatory Visit | Attending: Radiation Oncology | Admitting: Radiation Oncology

## 2018-09-03 ENCOUNTER — Telehealth: Payer: Self-pay | Admitting: *Deleted

## 2018-09-03 DIAGNOSIS — Z510 Encounter for antineoplastic radiation therapy: Secondary | ICD-10-CM | POA: Diagnosis not present

## 2018-09-03 DIAGNOSIS — C321 Malignant neoplasm of supraglottis: Secondary | ICD-10-CM | POA: Diagnosis not present

## 2018-09-03 NOTE — Telephone Encounter (Signed)
Oncology Nurse Navigator Documentation  Returned Ms. Buehler's call, indicated she should arrive as scheduled for today's XRT.  She voiced understanding.  Gayleen Orem, RN, BSN Head & Neck Oncology Nurse [place] at [place] [phone]

## 2018-09-04 ENCOUNTER — Ambulatory Visit
Admission: RE | Admit: 2018-09-04 | Discharge: 2018-09-04 | Disposition: A | Discharge status: Home / Self Care | Payer: Medicaid Other | Source: Ambulatory Visit | Attending: Radiation Oncology | Admitting: Radiation Oncology

## 2018-09-04 DIAGNOSIS — C321 Malignant neoplasm of supraglottis: Secondary | ICD-10-CM | POA: Diagnosis not present

## 2018-09-04 DIAGNOSIS — Z510 Encounter for antineoplastic radiation therapy: Secondary | ICD-10-CM | POA: Diagnosis not present

## 2018-09-05 ENCOUNTER — Ambulatory Visit
Admission: RE | Admit: 2018-09-05 | Discharge: 2018-09-05 | Disposition: A | Discharge status: Home / Self Care | Payer: Medicaid Other | Source: Ambulatory Visit | Attending: Radiation Oncology | Admitting: Radiation Oncology

## 2018-09-05 DIAGNOSIS — C321 Malignant neoplasm of supraglottis: Secondary | ICD-10-CM | POA: Diagnosis not present

## 2018-09-05 DIAGNOSIS — Z510 Encounter for antineoplastic radiation therapy: Secondary | ICD-10-CM | POA: Diagnosis not present

## 2018-09-08 ENCOUNTER — Ambulatory Visit
Admission: RE | Admit: 2018-09-08 | Discharge: 2018-09-08 | Disposition: A | Discharge status: Home / Self Care | Payer: Medicaid Other | Source: Ambulatory Visit | Attending: Radiation Oncology | Admitting: Radiation Oncology

## 2018-09-08 ENCOUNTER — Encounter: Payer: Self-pay | Admitting: *Deleted

## 2018-09-08 DIAGNOSIS — C321 Malignant neoplasm of supraglottis: Secondary | ICD-10-CM | POA: Diagnosis not present

## 2018-09-08 DIAGNOSIS — Z510 Encounter for antineoplastic radiation therapy: Secondary | ICD-10-CM | POA: Diagnosis not present

## 2018-09-08 NOTE — Progress Notes (Signed)
Oncology Nurse Navigator Documentation  To provide support, encouragement and care continuity, met with Ms. Monia Pouch during weekly PUT with Dr. Isidore Moos.   She completed 15 of 35 tmts today, arrived with <1 lb weight loss since last week; was accompanied by her patient mentor. She reported:  Taking hydrocodone TID and ibuprofen for throat pain.  Eating challenged d/t ageusia.    Ate 6 bites mac & cheese this weekend, eating pancakes with "lots of syrup and butter", going to try rice with butter this evening.  Drinking coffee, water 2 Ensure.  Applying aloe vera lotion to tmt area.  Removed nicotine patch this weekend; lozenges "burn my throat".  Smoking "just a puff" of cigarette daily.   Navigator Interventions  Supported current food intake, suggested using using whole milk rather than water to dilute Ensure.  Encouraged BID application of Sonafine in place of aloe vera lotion, explained Sonafine specially formulated for radiotherapy skin care.  Explained throat pain likely enhanced by her smoking cigarettes, encourage complete cessation.  Gayleen Orem, RN, BSN Head & Neck Oncology Nurse [place] at [place] [phone]

## 2018-09-09 ENCOUNTER — Telehealth: Payer: Self-pay | Admitting: *Deleted

## 2018-09-09 ENCOUNTER — Ambulatory Visit
Admission: RE | Admit: 2018-09-09 | Discharge: 2018-09-09 | Disposition: A | Discharge status: Home / Self Care | Payer: Medicaid Other | Source: Ambulatory Visit | Attending: Radiation Oncology | Admitting: Radiation Oncology

## 2018-09-09 DIAGNOSIS — C321 Malignant neoplasm of supraglottis: Secondary | ICD-10-CM | POA: Diagnosis not present

## 2018-09-09 DIAGNOSIS — Z510 Encounter for antineoplastic radiation therapy: Secondary | ICD-10-CM | POA: Diagnosis not present

## 2018-09-09 NOTE — Telephone Encounter (Signed)
Oncology Nurse Navigator Documentation  Returned Ms. Stadler's call, LVMM confirming her 1:45 XRT appt.  Gayleen Orem, RN, BSN Head & Neck Oncology Nurse [place] at [place] [phone]

## 2018-09-10 ENCOUNTER — Telehealth: Payer: Self-pay | Admitting: *Deleted

## 2018-09-10 ENCOUNTER — Ambulatory Visit: Payer: Medicaid Other

## 2018-09-10 NOTE — Telephone Encounter (Signed)
Oncology Nurse Navigator Documentation  Received VMM from patient indicating she is cancelling RT appt today d/t new onset persistent diarrhea which kept her up most of HS and continues today.  This navigator called her to check on well-being, unable to reach her, LVMM asking for call-back.  LINAC 1 and Dr. Isidore Moos notified.  Gayleen Orem, RN, BSN Head & Neck Oncology Nurse [place] at [place] [phone]

## 2018-09-11 ENCOUNTER — Ambulatory Visit: Payer: Medicaid Other

## 2018-09-11 ENCOUNTER — Inpatient Hospital Stay: Payer: Medicaid Other | Admitting: Nutrition

## 2018-09-11 ENCOUNTER — Encounter: Payer: Self-pay | Admitting: Nutrition

## 2018-09-11 NOTE — Progress Notes (Signed)
Patient did not show up for nutrition appointment. 

## 2018-09-12 ENCOUNTER — Telehealth: Payer: Self-pay | Admitting: *Deleted

## 2018-09-12 ENCOUNTER — Other Ambulatory Visit: Payer: Self-pay | Admitting: Radiation Oncology

## 2018-09-12 ENCOUNTER — Ambulatory Visit
Admission: RE | Admit: 2018-09-12 | Discharge: 2018-09-12 | Disposition: A | Discharge status: Home / Self Care | Payer: Medicaid Other | Source: Ambulatory Visit | Attending: Radiation Oncology | Admitting: Radiation Oncology

## 2018-09-12 ENCOUNTER — Encounter: Payer: Self-pay | Admitting: *Deleted

## 2018-09-12 ENCOUNTER — Ambulatory Visit: Payer: Medicaid Other

## 2018-09-12 ENCOUNTER — Other Ambulatory Visit: Payer: Self-pay

## 2018-09-12 ENCOUNTER — Encounter: Payer: Self-pay | Admitting: Radiation Oncology

## 2018-09-12 MED ORDER — FENTANYL 12 MCG/HR TD PT72: 12.5000 ug | MEDICATED_PATCH | TRANSDERMAL | 0 refills | Status: DC

## 2018-09-12 MED FILL — fentaNYL 12 MCG/HR PT72: 12 | 30 days supply | Qty: 10 | Fill #0

## 2018-09-12 NOTE — Telephone Encounter (Signed)
Oncology Nurse Navigator Documentation  Received call-back from Sheri Rivera. She stated she is cancelling XRT this afternoon bc "not feeling well".  She reported:  "Feeling shaky all over".  Took 7 mls Hycet short while ago for throat pain, caused mild SOB.  Abdominal swelling, "up to chest".  Diarrhea which started Wed is resolving. I encouraged her to come in for assessment, arrange for her to be seen by Dr. Lisbeth Renshaw.  Gayleen Orem, RN, BSN Head & Neck Oncology Nurse [place] at [place] [phone]

## 2018-09-12 NOTE — Progress Notes (Addendum)
Weight gain noted. Vitals stable. 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 Gayleen Orem, nurse navigator, arranged for patient to be evaluated in our clinic today. Patient is a smoker. Patient has a history of fibromyalgia, COPD, and anxiety. Patient taking Zoloft for anxiety but fearful to take Ativan prn while taking Hycet. Thick white coating noted tongue and buccal mucosa. Reports mouth, tongue, gum and throat pain 100 out of 10. Reports her tongue pain is undescribable and her throat feels like somone has poured acid down it. Reports her legs feel like jello. Confirms eating chicken noodle soup today. Reports despite her pain she can still swallow liquids. Reports hearing is muffled bilaterally. Reports nasal congestion, productive cough with thick ropey sputum and hoarseness. Reports she is keeping her lights out at home because her eyes hurt. Reports she has been using her inhalers and nebuilzer to manage her COPD. Reports she increased her hycet from 5 mL to 7 mL last night attempting to gain relief from her mouth pain but instead felt SOB. Reports she is only able to sleep 1-2 hours before waking up in pain. Reports she has been wheezing since yesterday. Patient has a history of IBS. Patient reports diarrhea is resolving. Reports her belly is swollen but this nurse only visualized minimal distention. Hyperpigmentation of chest noted without desquamation. Provided patient with another tube of Sonafine.   BP 136/78 (BP Location: Right Arm, Patient Position: Sitting)   Pulse 66   Temp 98.3 F (36.8 C) (Oral)   Resp (!) 24   Ht 5\' 3"  (1.6 m)   Wt 185 lb 12.8 oz (84.3 kg)   SpO2 97%   BMI 32.91 kg/m  Wt Readings from Last 3 Encounters:  09/12/18 185 lb 12.8 oz (84.3 kg)  08/05/18 183 lb 6.4 oz (83.2 kg)  08/04/18 183 lb 3.2 oz (83.1 kg)

## 2018-09-12 NOTE — Telephone Encounter (Signed)
Oncology Nurse Navigator Documentation  In follow-up to notification by LINAC 1 RTT that Ms. Detienne missed 3:45 appt, called to check on well-being.  LVMM asking for call-back.  Gayleen Orem, RN, BSN Head & Neck Oncology Nurse [place] at [place] [phone]

## 2018-09-12 NOTE — Telephone Encounter (Signed)
Oncology Nurse Navigator Documentation  Second call to pt to check on well-being, arrival status for 3:45 XRT.  Phone system message "Unable to complete call at this time.  Please try again later."  Gayleen Orem, RN, BSN Head & Neck Oncology Nurse [place] at [place] [phone]

## 2018-09-12 NOTE — Progress Notes (Signed)
Oncology Nurse Navigator Documentation  Met with Ms. Eisenberger during appt with Dr. Lisbeth Renshaw.  She was accompanied by her patient mentor who brought her to clinic.  She reviewed symptoms with RN Aldona Bar and then with Dr. Lisbeth Renshaw, including persistent throat and tongue pain, "shivers".  She noted she felt "loopy" when she took 7 ml Hycet last HS along with Ativan, felt chest tightness, breathing difficulty, found relief s/p using inhaler and nebulizer.  She noted she returned to 5 mL dose this morning, did not experience the above but pain control short-lived.  She was advised addressing persistent pain important to managing other symptoms.  She agreed to try Fentanyl 12.5 mcg patch for pain control with goal of use Hycet less frequently.  She was educated to apply patch back of upper arm, change to other arm in 3 days, informed may take a day or so for medication to be absorbed and reach steady state.  She voiced understanding.  She noted further her dtr encouraged her to stay with her over the weekend.  She voiced understanding she will meet with Dr. Isidore Moos on Monday.  Gayleen Orem, RN, BSN Head & Neck Oncology Nurse [place] at [place] [phone]

## 2018-09-15 ENCOUNTER — Ambulatory Visit
Admission: RE | Admit: 2018-09-15 | Discharge: 2018-09-15 | Disposition: A | Discharge status: Home / Self Care | Payer: Medicaid Other | Source: Ambulatory Visit | Attending: Radiation Oncology | Admitting: Radiation Oncology

## 2018-09-15 DIAGNOSIS — Z510 Encounter for antineoplastic radiation therapy: Secondary | ICD-10-CM | POA: Diagnosis not present

## 2018-09-15 DIAGNOSIS — C321 Malignant neoplasm of supraglottis: Secondary | ICD-10-CM | POA: Diagnosis not present

## 2018-09-16 ENCOUNTER — Telehealth: Payer: Self-pay

## 2018-09-16 ENCOUNTER — Telehealth: Payer: Self-pay | Admitting: *Deleted

## 2018-09-16 ENCOUNTER — Inpatient Hospital Stay (HOSPITAL_BASED_OUTPATIENT_CLINIC_OR_DEPARTMENT_OTHER): Payer: Medicaid Other | Admitting: Medical

## 2018-09-16 ENCOUNTER — Ambulatory Visit: Payer: Medicaid Other

## 2018-09-16 VITALS — BP 117/77 | HR 81 | Temp 98.0°F | Resp 18 | Ht 63.0 in | Wt 183.2 lb

## 2018-09-16 DIAGNOSIS — F17210 Nicotine dependence, cigarettes, uncomplicated: Secondary | ICD-10-CM

## 2018-09-16 DIAGNOSIS — C321 Malignant neoplasm of supraglottis: Secondary | ICD-10-CM

## 2018-09-16 DIAGNOSIS — J0110 Acute frontal sinusitis, unspecified: Secondary | ICD-10-CM | POA: Diagnosis not present

## 2018-09-16 DIAGNOSIS — Z923 Personal history of irradiation: Secondary | ICD-10-CM | POA: Diagnosis not present

## 2018-09-16 DIAGNOSIS — Z9221 Personal history of antineoplastic chemotherapy: Secondary | ICD-10-CM | POA: Diagnosis not present

## 2018-09-16 MED ORDER — LORATADINE 5 MG/5ML PO SYRP: 10.0000 mg | ORAL_SOLUTION | Freq: Every day | ORAL | 12 refills | Status: DC

## 2018-09-16 MED ORDER — PREDNISONE 5 MG/ML PO CONC: ORAL | 0 refills | Status: DC

## 2018-09-16 MED ORDER — AMOXICILLIN-POT CLAVULANATE 200-28.5 MG/5ML PO SUSR: 800.0000 mg | Freq: Two times a day (BID) | ORAL | 0 refills | Status: DC

## 2018-09-16 MED FILL — LORATADINE CHILDRENS 5 MG/5: 5 | 18 days supply | Qty: 180 | Fill #0

## 2018-09-16 MED FILL — predniSONE 1 MG TABS: 1 | 8 days supply | Qty: 21 | Fill #0

## 2018-09-16 MED FILL — AMOX-CLAV 600-42.9 MG/5 ML: 600-42.9 | 10 days supply | Qty: 150 | Fill #0

## 2018-09-16 NOTE — Patient Instructions (Signed)

## 2018-09-16 NOTE — Telephone Encounter (Addendum)
Oncology Nurse Navigator Documentation  Rec'd call from [name].    She reported severe sinus congestion, SOB, chest tightness, "bad cold".  Noted she used inhaler and nebulizer with minimal benefit; removed Fentanyl patch started last Friday thinking symptoms related to medication.    Per Dr. Isidore Moos, I arranged appt with [facility], called [name] to inform her of 2:00 appt, explained arrival/ registration procedures, reminder her of 1545 RT.  She voiced understanding.  Gayleen Orem, RN, BSN Head & Neck Oncology Nurse [place] at [place] [phone]

## 2018-09-16 NOTE — Telephone Encounter (Signed)
Added to [facility] for [facility] @ 2pm. Per 10/22 verbal request to add to [place] schedule.

## 2018-09-17 ENCOUNTER — Telehealth: Payer: Self-pay | Admitting: *Deleted

## 2018-09-17 ENCOUNTER — Ambulatory Visit
Admission: RE | Admit: 2018-09-17 | Discharge: 2018-09-17 | Disposition: A | Discharge status: Home / Self Care | Payer: Medicaid Other | Source: Ambulatory Visit | Attending: Radiation Oncology | Admitting: Radiation Oncology

## 2018-09-17 DIAGNOSIS — Z510 Encounter for antineoplastic radiation therapy: Secondary | ICD-10-CM | POA: Diagnosis not present

## 2018-09-17 DIAGNOSIS — C321 Malignant neoplasm of supraglottis: Secondary | ICD-10-CM | POA: Diagnosis not present

## 2018-09-17 NOTE — Telephone Encounter (Signed)
Oncology Nurse Navigator Documentation  Called Sheri Rivera in follow-up to visit to [name] yesterday afternoon and missed XRT.  LVMM asking for call-back.  Gayleen Orem, RN, BSN Head & Neck Oncology Nurse [place] at [place] [phone]

## 2018-09-17 NOTE — Telephone Encounter (Signed)
Oncology Nurse Navigator Documentation  Sheri Rivera returned my call, explained:  Has sinus infection which has completely obstructed her sinuses/nasal passages, has to breathe through mouth.    Was Rxed abx by PA Isabella Bowens, [facility]    Because of concern re being unable to breathe through mouth with Aquaplast mask in place, she did not arrive for RT yesterday afternoon.  Further noted she wishes to proceed with adjustment in RT plan per conversation on Monday.  I explained I will notify Dr. Isidore Moos. Navigator Interventions  I encouraged her to arrive for RT this afternoon, to complete as many tmts as feasible.  She voiced acknowledgement.  Notified Dr. Isidore Moos verbally of request re RT plan.  Sheri Orem, RN, BSN Head & Neck Oncology Nurse [place] at [place] [phone]

## 2018-09-18 ENCOUNTER — Ambulatory Visit
Admission: RE | Admit: 2018-09-18 | Discharge: 2018-09-18 | Disposition: A | Discharge status: Home / Self Care | Payer: Medicaid Other | Source: Ambulatory Visit | Attending: Radiation Oncology | Admitting: Radiation Oncology

## 2018-09-18 ENCOUNTER — Telehealth: Payer: Self-pay | Admitting: *Deleted

## 2018-09-18 ENCOUNTER — Inpatient Hospital Stay: Payer: Medicaid Other | Admitting: Nutrition

## 2018-09-18 DIAGNOSIS — Z510 Encounter for antineoplastic radiation therapy: Secondary | ICD-10-CM | POA: Diagnosis not present

## 2018-09-18 DIAGNOSIS — C321 Malignant neoplasm of supraglottis: Secondary | ICD-10-CM | POA: Diagnosis not present

## 2018-09-18 NOTE — Telephone Encounter (Signed)
Oncology Nurse Navigator Documentation  In follow-up to yesterday's VMM requesting call-back, LVMM indicating RT tmt plan has been adjusted and only 10 tmts remaining.  Noted she has Nutrition appt following this afternoon's RT, emphasized importance of showing for both appts. Requested call-back to confirm message receipt.  Gayleen Orem, RN, BSN Head & Neck Oncology Nurse [place] at [place] [phone]

## 2018-09-18 NOTE — Progress Notes (Signed)
Nutrition follow-up completed with patient status post radiation therapy for laryngeal cancer. Weight decreased and documented as 179.4 pounds today. Patient reports she has increased pain on her tongue and extreme taste alterations preventing her from eating much food. She is tolerating Jell-O, pudding, grits, and eggs. She uses Ensure Enlive in her coffee but does not care for the taste.  Nutrition diagnosis:  Predicted suboptimal energy intake has evolved into inadequate oral intake related to laryngeal cancer and associated treatments as evidenced by history or presence of a condition for which research shows increased incidence of suboptimal energy intake  Intervention: Patient was educated to try to increase Ensure Enlive to 6 bottles daily.   Provided multiple recipes and suggestions for patient to consume. Encouraged her to continue to rinse her mouth out using baking soda. Encouraged her to take pain medication as prescribed. Educated patient about the importance of minimizing further weight loss as it can cause delayed healing.  Monitoring, evaluation, goals: Patient will tolerate increased calories and protein to minimize further weight loss.  Next visit: Wednesday, October 30 after radiation therapy.  **Disclaimer: This note was dictated with voice recognition software. Similar sounding words can inadvertently be transcribed and this note may contain transcription errors which may not have been corrected upon publication of note.**

## 2018-09-18 NOTE — Telephone Encounter (Signed)
Oncology Nurse Navigator Documentation  Ms. Leary returned my call, confirmed understanding she has 10 tmts remaining starting with today's, confirmed understanding of Nutrition appt following today's RT.  Gayleen Orem, RN, BSN Head & Neck Oncology Nurse [place] at [place] [phone]

## 2018-09-19 ENCOUNTER — Ambulatory Visit: Payer: Medicaid Other

## 2018-09-19 ENCOUNTER — Telehealth: Payer: Self-pay | Admitting: *Deleted

## 2018-09-19 NOTE — Telephone Encounter (Signed)
Oncology Nurse Navigator Documentation  Rec'd VMM from patient's mentor indicating she rec'd text from pt stating she is cancelling RT appt, unable to arrive b/c in to much pain, unable to call me b/cunable to speak.  Dr. Isidore Moos and [place] 1 notified.  Gayleen Orem, RN, BSN Head & Neck Oncology Nurse [place] at [place] [phone]

## 2018-09-19 NOTE — Progress Notes (Signed)
Symptoms Management Clinic Progress Note   [name] [id number] 1961/01/25 57 y.o.  Sheri Rivera is managed by Dr. Truitt Merle  Actively treated with chemotherapy/immunotherapy: no   Current Therapy: Radiation therapy   Assessment: Plan:    Acute frontal sinusitis, recurrence not specified - Plan: amoxicillin-clavulanate (AUGMENTIN) 200-28.5 MG/5ML suspension, predniSONE (PREDNISONE INTENSOL) 5 MG/ML concentrated solution, loratadine (CLARITIN) 5 MG/5ML syrup  Malignant neoplasm of supraglottis (HCC)   Acute frontal sinusitis: The patient was given a prescription for prednisone and Augmentin.  She was also given a prescription for loratadine syrup.  Malignant neoplasm of the supraglottis: The patient continues on radiation therapy under the direction of Dr. Isidore Moos.  Please see After Visit Summary for patient specific instructions.  Future Appointments  Date Time Provider [place]  09/19/2018  3:45 PM [facility] LINAC 1 [facility] None  09/22/2018  3:45 PM [facility] LINAC 1 [facility] None  09/23/2018  3:45 PM [facility] LINAC 1 [facility] None  09/24/2018  2:30 PM Neff, Barbara L, RD [facility] None  09/24/2018  3:45 PM [facility] LINAC 1 [facility] None  09/25/2018  3:45 PM [facility] LINAC 1 [facility] None  09/26/2018  3:45 PM [facility] LINAC 1 [facility] None  09/29/2018  3:45 PM [facility] LINAC 1 [facility] None  09/30/2018  2:30 PM Neff, Barbara L, RD [facility] None  09/30/2018  3:45 PM [facility] LINAC 1 [facility] None  10/01/2018  3:45 PM [facility] LINAC 1 [facility] None    No orders of the defined types were placed in this encounter.      Subjective:   Patient ID:  Sheri Rivera is a 57 y.o. (DOB 1961-02-01) female.  Chief Complaint:  Chief Complaint  Patient presents with  . Recurrent Sinusitis    HPI Sheri Rivera is a 57 year old female with a diagnosis of a malignant neoplasm of the supraglottis.  She  is receiving radiation therapy under the direction of Dr. Isidore Moos.  She presents to the office today with a headache, fullness in her ears, photosensitivity, facial pressure, sinus pain, nonproductive cough, postnasal drainage and throat pain.  She is also had shortness of breath and dyspnea on exertion.  She has been using a nebulizer and an inhaler.  She is having difficulty swallowing and would like a prescription for a liquid antihistamine.  Medications: I have reviewed the patient's current medications.  Allergies:  Allergies  Allergen Reactions  . Clindamycin/Lincomycin Anaphylaxis and Swelling  . Cymbalta [Duloxetine Hcl] Anaphylaxis and Itching    Lips and throat swelled; stopped breathing  . Levaquin [Levofloxacin In D5w] Anaphylaxis and Itching  . Lyrica [Pregabalin] Shortness Of Breath and Swelling    Lips and throat swelled  . 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 Sulfamethoxazole Anaphylaxis  . Codeine Anxiety    Sweating,nervous    Past Medical History:  Diagnosis Date  . Anxiety 11/11/11  . Arthritis   . Asthma   . Bursitis    right shoulder  . Chronic back pain   . Chronic neck pain   . Chronic shoulder pain   . COPD (chronic obstructive pulmonary disease) ([place])    continues to smoke 1ppd  . DDD (degenerative disc disease)   . Depression   . Fibromyalgia   . GERD (gastroesophageal reflux disease)   . Heart murmur    as a child per pt  . Herpes   . IBS (irritable bowel syndrome)   . Lumbar radiculopathy   . Seasonal allergies   .  Smoker   . Thyroid nodule    goiters    Past Surgical History:  Procedure Laterality Date  . CHOLECYSTECTOMY    . DIRECT LARYNGOSCOPY N/A 07/11/2018   Procedure: DIRECT LARYNGOSCOPY WITH BIOPSY;  Surgeon: Rozetta Nunnery, MD;  Location: [facility];  Service: ENT;  Laterality: N/A;  . HERNIA REPAIR     [place]   . HIATAL HERNIA REPAIR    . TUBAL LIGATION  1983    Family History  Problem  Relation Age of Onset  . Lung cancer Mother   . Arthritis Mother        Rheumatoid  . Bursitis Mother   . Asthma Mother   . Heart attack Father 74       Died of MI  . Obesity Father   . Fibromyalgia Sister   . Asthma Child     Social History   Socioeconomic History  . Marital status: Divorced    Spouse name: Not on file  . Number of children: 3  . Years of education: Not on file  . Highest education level: Not on file  Occupational History    Employer: [employer]  Social Needs  . Financial resource strain: Not on file  . Food insecurity:    Worry: Not on file    Inability: Not on file  . Transportation needs:    Medical: No    Non-medical: No  Tobacco Use  . Smoking status: Current Every Day Smoker    Packs/day: 1.50    Years: 27.00    Pack years: 40.50    Types: Cigarettes  . Smokeless tobacco: Never Used  Substance and Sexual Activity  . Alcohol use: No  . Drug use: No  . Sexual activity: Not on file  Lifestyle  . Physical activity:    Days per week: Not on file    Minutes per session: Not on file  . Stress: Not on file  Relationships  . Social connections:    Talks on phone: Not on file    Gets together: Not on file    Attends religious service: Not on file    Active member of club or organization: Not on file    Attends meetings of clubs or organizations: Not on file    Relationship status: Not on file  . Intimate partner violence:    Fear of current or ex partner: No    Emotionally abused: No    Physically abused: No    Forced sexual activity: No  Other Topics Concern  . Not on file  Social History Narrative   She lives by herself    Past Medical History, Surgical history, Social history, and Family history were reviewed and updated as appropriate.   Please see review of systems for further details on the patient's review from today.   Review of Systems:  Review of Systems  Constitutional: Negative for chills, diaphoresis and fever.    HENT: Positive for ear pain, postnasal drip, sore throat and trouble swallowing.   Eyes: Positive for photophobia.  Respiratory: Positive for cough and shortness of breath. Negative for choking, chest tightness, wheezing and stridor.   Cardiovascular: Negative for chest pain and palpitations.  Neurological: Positive for headaches.    Objective:   Physical Exam:  BP 117/77 (BP Location: Left Arm, Patient Position: Sitting)   Pulse 81   Temp 98 F (36.7 C) (Oral)   Resp 18   Ht 5\' 3"  (1.6 m)   Wt  183 lb 3.2 oz (83.1 kg)   SpO2 94%   BMI 32.45 kg/m  ECOG: 0  Physical Exam  Constitutional: No distress.  HENT:  Head: Normocephalic and atraumatic.  Nose: Right sinus exhibits frontal sinus tenderness. Right sinus exhibits no maxillary sinus tenderness. Left sinus exhibits frontal sinus tenderness. Left sinus exhibits no maxillary sinus tenderness.  Mouth/Throat: No oropharyngeal exudate.  Cardiovascular: Normal rate, regular rhythm and normal heart sounds. Exam reveals no gallop and no friction rub.  No murmur heard. Pulmonary/Chest: Effort normal and breath sounds normal. No respiratory distress. She has no wheezes. She has no rales.  Neurological: She is alert.  Skin: Skin is warm and dry. No rash noted. She is not diaphoretic. No erythema.    Lab Review:     Component Value Date/Time   NA 142 06/29/2018 1726   K 3.8 06/29/2018 1726   CL 107 06/29/2018 1726   CO2 25 06/29/2018 1726   GLUCOSE 99 06/29/2018 1726   BUN 7 06/29/2018 1726   CREATININE 0.43 (L) 06/29/2018 1726   CALCIUM 9.4 06/29/2018 1726   PROT 5.9 (L) 04/15/2018 1334   ALBUMIN 3.3 (L) 04/15/2018 1334   AST 18 04/15/2018 1334   ALT 16 04/15/2018 1334   ALKPHOS 81 04/15/2018 1334   BILITOT 0.9 04/15/2018 1334   GFRNONAA >60 06/29/2018 1726   GFRAA >60 06/29/2018 1726       Component Value Date/Time   WBC 9.0 06/29/2018 1726   RBC 5.13 (H) 06/29/2018 1726   HGB 16.2 (H) 06/29/2018 1726   HCT 48.3  (H) 06/29/2018 1726   PLT 272 06/29/2018 1726   MCV 94.2 06/29/2018 1726   MCH 31.6 06/29/2018 1726   MCHC 33.5 06/29/2018 1726   RDW 14.1 06/29/2018 1726   LYMPHSABS 2.7 06/29/2018 1726   MONOABS 0.6 06/29/2018 1726   EOSABS 0.1 06/29/2018 1726   BASOSABS 0.0 06/29/2018 1726   -------------------------------  Imaging from last 24 hours (if applicable):  Radiology interpretation: No results found.

## 2018-09-22 ENCOUNTER — Telehealth: Payer: Self-pay | Admitting: *Deleted

## 2018-09-22 ENCOUNTER — Ambulatory Visit
Admission: RE | Admit: 2018-09-22 | Discharge: 2018-09-22 | Disposition: A | Discharge status: Home / Self Care | Payer: Medicaid Other | Source: Ambulatory Visit | Attending: Radiation Oncology | Admitting: Radiation Oncology

## 2018-09-22 ENCOUNTER — Other Ambulatory Visit: Payer: Self-pay | Admitting: Radiation Oncology

## 2018-09-22 DIAGNOSIS — C321 Malignant neoplasm of supraglottis: Secondary | ICD-10-CM | POA: Diagnosis not present

## 2018-09-22 DIAGNOSIS — Z510 Encounter for antineoplastic radiation therapy: Secondary | ICD-10-CM | POA: Diagnosis not present

## 2018-09-22 MED ORDER — RADIAPLEXRX EX GEL
Freq: Once | CUTANEOUS | Status: AC
  Administered 2018-09-22: 16:00:00 via TOPICAL

## 2018-09-22 MED ORDER — HYDROCODONE-ACETAMINOPHEN 7.5-325 MG/15ML PO SOLN: 10.0000 mL | ORAL | 0 refills | Status: DC | PRN

## 2018-09-22 MED FILL — HYDROCOD-APAP 7.5-325/15ML: 7.5-325 | 5 days supply | Qty: 473 | Fill #0

## 2018-09-22 NOTE — Telephone Encounter (Signed)
Oncology Nurse Navigator Documentation  Rec'd call from Sheri Rivera confirming her arrival for RT appt followed by weekly PUT with Dr. Isidore Moos. LINAC 1 notified.  Gayleen Orem, RN, BSN Head & Neck Oncology Nurse [place] at [place] [phone]

## 2018-09-22 NOTE — Telephone Encounter (Signed)
Oncology Nurse Navigator Documentation  In context of Ms. Smolenski's cancellation of last Friday's RT appt, called to check on well-being and to confirm her arrival for today's 1545 appt.  LVMM asking for return call.  Gayleen Orem, RN, BSN Head & Neck Oncology Nurse [place] at [place] [phone]

## 2018-09-23 ENCOUNTER — Encounter: Payer: Self-pay | Admitting: Nutrition

## 2018-09-23 ENCOUNTER — Telehealth: Payer: Self-pay | Admitting: *Deleted

## 2018-09-23 ENCOUNTER — Other Ambulatory Visit: Payer: Self-pay | Admitting: Radiation Oncology

## 2018-09-23 ENCOUNTER — Ambulatory Visit
Admission: RE | Admit: 2018-09-23 | Discharge: 2018-09-23 | Disposition: A | Discharge status: Home / Self Care | Payer: Medicaid Other | Source: Ambulatory Visit | Attending: Radiation Oncology | Admitting: Radiation Oncology

## 2018-09-23 DIAGNOSIS — Z510 Encounter for antineoplastic radiation therapy: Secondary | ICD-10-CM | POA: Diagnosis not present

## 2018-09-23 DIAGNOSIS — C321 Malignant neoplasm of supraglottis: Secondary | ICD-10-CM

## 2018-09-23 MED ORDER — SUCRALFATE 1 G PO TABS: ORAL_TABLET | ORAL | 4 refills | Status: DC

## 2018-09-23 MED FILL — SUCRALFATE 1 GM TABLET: 1 | 10 days supply | Qty: 40 | Fill #0

## 2018-09-23 NOTE — Progress Notes (Signed)
Patient has cancelled all nutrition appointments. She does not wish to reschedule.

## 2018-09-23 NOTE — Telephone Encounter (Signed)
Oncology Nurse Navigator Documentation  Called Ms. Halberstadt in follow-up to call rec'd from Ms Buckbee a few minutes ago asking to be seen by Dr. Isidore Moos d/t difficulty swallowing food/liquids, "hits mid throat and comes back up, choking on pain meds" and my offer to be seen by [facility] if available, which she refused.  Spoke with Dr. Isidore Moos who agreed to see her s/p tmt this afternoon.  I called Ms. Kuehne back to inform.  She voiced understanding.  Gayleen Orem, RN, BSN Head & Neck Oncology Nurse [place] at [place] [phone]

## 2018-09-24 ENCOUNTER — Telehealth: Payer: Self-pay | Admitting: *Deleted

## 2018-09-24 ENCOUNTER — Other Ambulatory Visit: Payer: Self-pay

## 2018-09-24 ENCOUNTER — Ambulatory Visit
Admission: RE | Admit: 2018-09-24 | Discharge: 2018-09-24 | Disposition: A | Discharge status: Home / Self Care | Payer: Medicaid Other | Source: Ambulatory Visit | Attending: Radiation Oncology | Admitting: Radiation Oncology

## 2018-09-24 ENCOUNTER — Ambulatory Visit: Payer: Medicaid Other

## 2018-09-24 ENCOUNTER — Telehealth: Payer: Self-pay | Admitting: Radiation Oncology

## 2018-09-24 ENCOUNTER — Inpatient Hospital Stay: Payer: Self-pay | Admitting: Nutrition

## 2018-09-24 VITALS — BP 122/73 | HR 79 | Temp 97.8°F

## 2018-09-24 DIAGNOSIS — C321 Malignant neoplasm of supraglottis: Secondary | ICD-10-CM | POA: Diagnosis not present

## 2018-09-24 DIAGNOSIS — Z510 Encounter for antineoplastic radiation therapy: Secondary | ICD-10-CM | POA: Diagnosis not present

## 2018-09-24 MED ORDER — SODIUM CHLORIDE 0.9 % IV SOLN
INTRAVENOUS | Status: AC
  Administered 2018-09-24: 13:00:00 via INTRAVENOUS
  Filled 2018-09-24 (×2): qty 250

## 2018-09-24 MED ORDER — SODIUM CHLORIDE 0.9% FLUSH
10.0000 mL | Freq: Once | INTRAVENOUS | Status: AC
  Administered 2018-09-24: 10 mL via INTRAVENOUS

## 2018-09-24 NOTE — Progress Notes (Signed)
Ms. Courter presented today in radiation oncology to receive 1 Liter of normal saline. She tolerated the fluids well without any difficulty. She did decline to have her BMET lab drawn today. She did agree to come early for radiation tomorrow and have the BMET drawn before her treatment. Dr. Isidore Moos had ordered Ms. Swor to receive IVF Monday through Friday until 10/10/18 to help avoid dehydration while completing her radiation therapy to her larynx. Today Ms. Streight informed me that she has decided to decline those IVF appointments. She is currently able to drink water slightly better than yesterday. I have encouraged her to continue drinking fluids to keep herself hydrated. I have purposefully left her scheduled IVF in place, due to patients difficulty drinking at times. She does have my direct number to contact me as needed.

## 2018-09-24 NOTE — Telephone Encounter (Signed)
Oncology Nurse Navigator Documentation  Returned Ms. Pelham's call, addressed her question re multiple IVF appts scheduled per her visit with Dr. Isidore Moos yesterday afternoon, explained importance of her maintaining hydration d/t reported diarrhea, as she finishes RT.  She stated she does not want to continue with fluids after she completes RT.  I encouraged her to adhere to Dr. Pearlie Oyster recommendation.  Also informed her to arrive to arrive to Radiation Waiting by 1:00 today for IVF.  She voiced understanding.  Gayleen Orem, RN, BSN Head & Neck Oncology Nurse [place] at [place] [phone]

## 2018-09-24 NOTE — Telephone Encounter (Signed)
Scheduled appt per 10/30 sch message - left message for patient with appt date and time.   

## 2018-09-25 ENCOUNTER — Ambulatory Visit: Payer: Medicaid Other

## 2018-09-25 ENCOUNTER — Telehealth: Payer: Self-pay | Admitting: *Deleted

## 2018-09-25 ENCOUNTER — Ambulatory Visit: Payer: Self-pay

## 2018-09-25 ENCOUNTER — Ambulatory Visit
Admission: RE | Admit: 2018-09-25 | Discharge: 2018-09-25 | Disposition: A | Discharge status: Home / Self Care | Payer: Medicaid Other | Source: Ambulatory Visit | Attending: Radiation Oncology | Admitting: Radiation Oncology

## 2018-09-25 DIAGNOSIS — C321 Malignant neoplasm of supraglottis: Secondary | ICD-10-CM | POA: Diagnosis not present

## 2018-09-25 DIAGNOSIS — Z510 Encounter for antineoplastic radiation therapy: Secondary | ICD-10-CM | POA: Diagnosis not present

## 2018-09-25 NOTE — Telephone Encounter (Signed)
Oncology Nurse Navigator Documentation  Rec'd call from Transportation Coordinator, Ginette Otto, indicating she rec'd call from Ms. Diprima cancelling her ride for today's 3:45 RT appt, cited reason "not feeling well".  I called Ms. Monia Pouch, LVMM requesting call back to check on well-being.  Gayleen Orem, RN, BSN Head & Neck Oncology Nurse [place] at [place] [phone]

## 2018-09-26 ENCOUNTER — Ambulatory Visit: Payer: Medicaid Other

## 2018-09-26 ENCOUNTER — Inpatient Hospital Stay: Payer: Medicaid Other

## 2018-09-26 DIAGNOSIS — C321 Malignant neoplasm of supraglottis: Secondary | ICD-10-CM | POA: Diagnosis not present

## 2018-09-27 ENCOUNTER — Inpatient Hospital Stay: Payer: Medicaid Other | Attending: Radiation Oncology

## 2018-09-29 ENCOUNTER — Encounter: Payer: Self-pay | Admitting: *Deleted

## 2018-09-29 ENCOUNTER — Other Ambulatory Visit: Payer: Self-pay | Admitting: Radiation Oncology

## 2018-09-29 ENCOUNTER — Inpatient Hospital Stay: Payer: Medicaid Other

## 2018-09-29 ENCOUNTER — Telehealth: Payer: Self-pay | Admitting: *Deleted

## 2018-09-29 ENCOUNTER — Ambulatory Visit: Payer: Medicaid Other

## 2018-09-29 DIAGNOSIS — C321 Malignant neoplasm of supraglottis: Secondary | ICD-10-CM

## 2018-09-29 MED ORDER — FLUCONAZOLE 10 MG/ML PO SUSR: ORAL | 0 refills | Status: DC

## 2018-09-29 NOTE — Telephone Encounter (Signed)
Oncology Nurse Navigator Documentation  Placed 2nd return call this morning in follow-up to pt's VMM yesterday re continuation of tmt.  LVMM asking for call-back.  Gayleen Orem, RN, BSN Head & Neck Oncology Nurse [place] at [place] [phone]

## 2018-09-29 NOTE — Progress Notes (Signed)
Oncology Nurse Navigator Documentation  Met with Sheri Rivera during weekly PUT with Dr. Squire.  She did not receive RT today, was accompanied by her patient mentor. She reported:  Hydrocodone "burns" her tongue, is mixing with coffee or Ensure to avoid pain.  Has been sipping Ensure throughout the day.  Mouth pain > throat pain. She rec'd Rx for oral yeast infection, accepted Dr. Squire's plan to resume RT on Thursday and Friday, continue Monday, Tuesday and finish Wed of next week. She rec'd encouragement from Dr. Squire and this navigator to continue with scheduled IVF to avoid dehydration.  She tentatively agreed to arrive tomorrow for IVF, indicated she would call me in the morning to confirm.  She voiced understanding of Dr. Squire's suggestion to increase Hycet to 15 ml for additional pain relief.  Rick Diehl, RN, BSN Head & Neck Oncology Nurse Navigator [facility] at [place] [phone]   

## 2018-09-29 NOTE — Progress Notes (Signed)
A user error has taken place: encounter opened in error, closed for administrative reasons.

## 2018-09-29 NOTE — Telephone Encounter (Signed)
A user error has taken place: encounter opened in error, closed for administrative reasons.

## 2018-09-29 NOTE — Telephone Encounter (Signed)
Oncology Nurse Navigator Documentation  Ms. Nieland returned my calls.  She stated she can no longer continue with RT, is cancelling today's appt.  She was unable to swallow soft foods or liquid over the weekend, "I just want to stay in bed and get better".  She stated she is unable to take Hycet for pain, "burns my tongue", has not worn fentanyl b/c caused SOB.   She further noted she has no one who can watch her dogs, had difficulties with her son over the weekend.  I encouraged her to try tmt today, she reiterated refusal.  I encouraged her to arrrive for 1:45 IVF, emphasized importance since she reported difficulty with oral intake over the weekend, she refused.  I encouraged her to arrive to see Dr. Isidore Moos to discuss current symptoms and her wish to stop RT, she agreed.  Dr. Isidore Moos, RN [place] and [place] 1 notified.  Gayleen Orem, RN, BSN Head & Neck Oncology Nurse [place] at [place] [phone]

## 2018-09-30 ENCOUNTER — Telehealth: Payer: Self-pay | Admitting: *Deleted

## 2018-09-30 ENCOUNTER — Inpatient Hospital Stay: Payer: Medicaid Other

## 2018-09-30 ENCOUNTER — Ambulatory Visit
Admission: RE | Admit: 2018-09-30 | Discharge: 2018-09-30 | Disposition: A | Discharge status: Home / Self Care | Payer: Medicaid Other | Source: Ambulatory Visit | Attending: Radiation Oncology | Admitting: Radiation Oncology

## 2018-09-30 ENCOUNTER — Ambulatory Visit: Payer: Medicaid Other

## 2018-09-30 ENCOUNTER — Inpatient Hospital Stay: Payer: Self-pay | Admitting: Nutrition

## 2018-09-30 DIAGNOSIS — Z510 Encounter for antineoplastic radiation therapy: Secondary | ICD-10-CM | POA: Insufficient documentation

## 2018-09-30 DIAGNOSIS — C321 Malignant neoplasm of supraglottis: Secondary | ICD-10-CM | POA: Diagnosis not present

## 2018-09-30 NOTE — Telephone Encounter (Signed)
Oncology Nurse Navigator Documentation  Returned Sheri Rivera's VMM, she indicated she is coming in for this afternoon's 3:45 RT appt.  I encouraged her to arrive for 1:30 IVF but she refused opportunity.  Dr. Isidore Moos and Infusion informed.  Gayleen Orem, RN, BSN Head & Neck Oncology Nurse [place] at [place] [phone]

## 2018-10-01 ENCOUNTER — Ambulatory Visit: Payer: Medicaid Other

## 2018-10-01 ENCOUNTER — Inpatient Hospital Stay: Payer: Medicaid Other

## 2018-10-01 ENCOUNTER — Telehealth: Payer: Self-pay | Admitting: *Deleted

## 2018-10-01 ENCOUNTER — Ambulatory Visit
Admission: RE | Admit: 2018-10-01 | Discharge: 2018-10-01 | Disposition: A | Discharge status: Home / Self Care | Payer: Medicaid Other | Source: Ambulatory Visit | Attending: Radiation Oncology | Admitting: Radiation Oncology

## 2018-10-01 DIAGNOSIS — C321 Malignant neoplasm of supraglottis: Secondary | ICD-10-CM | POA: Diagnosis not present

## 2018-10-01 DIAGNOSIS — Z510 Encounter for antineoplastic radiation therapy: Secondary | ICD-10-CM | POA: Diagnosis not present

## 2018-10-01 NOTE — Telephone Encounter (Signed)
Oncology Nurse Navigator Documentation  [name] called to acknowledge by recent VMM.  She stated Friday will be her last XRT, she will not come in on Monday for last tmt as scheduled/recommended.  I explained she should arrive Monday to optimize treatment outcome. "I just want to get this done with, I need to start recovering."   She repeated refusal of Monday's tmt.  I encouraged her to keep scheduled IVF, explained fluids will help with her overall well-being, post-XRT recovery.  She stated she is "sippping plenty of water", directed me to cancel IVF appts.  Dr. Isidore Moos informed, supported cancellation of remaining IVF to free up needed Infusion/SMC times.  I cancelled IVF, informed Infusion Charge RN, Lauren.  Gayleen Orem, RN, BSN Head & Neck Oncology Nurse [place] at [place] [phone]

## 2018-10-01 NOTE — Telephone Encounter (Signed)
Oncology Nurse Navigator Documentation  Returned Ms. Bonifas's morning call, LVMM indicating Dr. Isidore Moos stated final RT should be Monday 11/11 and not Friday.  I also informed today's 1:15  IVF has been cancelled.  Gayleen Orem, RN, BSN Head & Neck Oncology Nurse [place] at [place] [phone]

## 2018-10-02 ENCOUNTER — Ambulatory Visit: Payer: Medicaid Other

## 2018-10-02 ENCOUNTER — Ambulatory Visit: Payer: Self-pay

## 2018-10-02 ENCOUNTER — Encounter: Payer: Self-pay | Admitting: *Deleted

## 2018-10-02 ENCOUNTER — Ambulatory Visit
Admission: RE | Admit: 2018-10-02 | Discharge: 2018-10-02 | Disposition: A | Discharge status: Home / Self Care | Payer: Medicaid Other | Source: Ambulatory Visit | Attending: Radiation Oncology | Admitting: Radiation Oncology

## 2018-10-02 DIAGNOSIS — C321 Malignant neoplasm of supraglottis: Secondary | ICD-10-CM | POA: Diagnosis not present

## 2018-10-02 DIAGNOSIS — Z510 Encounter for antineoplastic radiation therapy: Secondary | ICD-10-CM | POA: Diagnosis not present

## 2018-10-02 NOTE — Progress Notes (Signed)
Oncology Nurse Navigator Documentation  Paged to Radiation Waiting to see Sheri Rivera.  She reported/showed skin desquamation that occurred last HS, "blisters popped open". Area of concern approximately 15 mm widest dimension, appeared dry.  In response to her concern of infection, I provided her 8 packets of abx ointment, instructed her to apply to broken skin instead of Sonafine.  She voiced understanding.  I confirmed her understanding of of 3:00 follow-up with Dr. Isidore Moos on Monday.  She indicated she planned to have RT on Monday; I commended her decision.  Gayleen Orem, RN, BSN Head & Neck Oncology Nurse [place] at [place] [phone]

## 2018-10-03 ENCOUNTER — Ambulatory Visit: Payer: Medicaid Other

## 2018-10-03 ENCOUNTER — Ambulatory Visit
Admission: RE | Admit: 2018-10-03 | Discharge: 2018-10-03 | Disposition: A | Discharge status: Home / Self Care | Payer: Medicaid Other | Source: Ambulatory Visit | Attending: Radiation Oncology | Admitting: Radiation Oncology

## 2018-10-03 ENCOUNTER — Ambulatory Visit: Payer: Self-pay

## 2018-10-03 ENCOUNTER — Encounter: Payer: Self-pay | Admitting: Radiation Oncology

## 2018-10-03 DIAGNOSIS — Z510 Encounter for antineoplastic radiation therapy: Secondary | ICD-10-CM | POA: Diagnosis not present

## 2018-10-03 DIAGNOSIS — C321 Malignant neoplasm of supraglottis: Secondary | ICD-10-CM | POA: Diagnosis not present

## 2018-10-06 ENCOUNTER — Ambulatory Visit: Payer: Self-pay

## 2018-10-06 ENCOUNTER — Ambulatory Visit: Payer: Medicaid Other

## 2018-10-06 ENCOUNTER — Ambulatory Visit: Admission: RE | Admit: 2018-10-06 | Payer: Medicaid Other | Source: Ambulatory Visit | Admitting: Radiation Oncology

## 2018-10-06 ENCOUNTER — Telehealth: Payer: Self-pay | Admitting: *Deleted

## 2018-10-06 NOTE — Telephone Encounter (Signed)
Oncology Nurse Navigator Documentation  Received call from Ms. Ingalls's Patient Mentor indicating she had received text from Ms. Menn stating "can't talk, legs hurt, can't get out of bed" for this afternoon's appt to see Dr. Isidore Moos and final RT.  I asked her to text Ms Inga and per our conversation encourage her to arrive to minimally see Dr. Isidore Moos.  I received call-back from mentor indicating Ms. Rossetti said she was not coming.  I called her, LVMM emphasizing importance of seeing Dr. Isidore Moos irrespective of whether she is treated, asked her to reconsider her arrival.  Dr. Isidore Moos, RN [place] and [place] 1 notified.  Gayleen Orem, RN, BSN Head & Neck Oncology Nurse [place] at [place] [phone]

## 2018-10-07 ENCOUNTER — Telehealth: Payer: Self-pay | Admitting: *Deleted

## 2018-10-07 ENCOUNTER — Ambulatory Visit: Payer: Medicaid Other

## 2018-10-07 ENCOUNTER — Other Ambulatory Visit: Payer: Self-pay | Admitting: Radiation Oncology

## 2018-10-07 ENCOUNTER — Ambulatory Visit: Payer: Self-pay

## 2018-10-07 ENCOUNTER — Encounter: Payer: Self-pay | Admitting: *Deleted

## 2018-10-07 ENCOUNTER — Ambulatory Visit
Admission: RE | Admit: 2018-10-07 | Discharge: 2018-10-07 | Disposition: A | Discharge status: Home / Self Care | Payer: Medicaid Other | Source: Ambulatory Visit | Attending: Radiation Oncology | Admitting: Radiation Oncology

## 2018-10-07 DIAGNOSIS — C321 Malignant neoplasm of supraglottis: Secondary | ICD-10-CM | POA: Diagnosis not present

## 2018-10-07 DIAGNOSIS — Z510 Encounter for antineoplastic radiation therapy: Secondary | ICD-10-CM | POA: Diagnosis not present

## 2018-10-07 LAB — BASIC METABOLIC PANEL - CANCER CENTER ONLY
Anion gap: 9 (ref 5–15)
BUN: 8 mg/dL (ref 6–20)
CO2: 26 mmol/L (ref 22–32)
Calcium: 9.5 mg/dL (ref 8.9–10.3)
Chloride: 105 mmol/L (ref 98–111)
Creatinine: 0.64 mg/dL (ref 0.44–1.00)
GFR, Est AFR Am: >60 mL/min (ref >60)
GFR, Estimated: >60 mL/min (ref >60)
Glucose, Bld: 93 mg/dL (ref 70–99)
Potassium: 3.9 mmol/L (ref 3.5–5.1)
Sodium: 140 mmol/L (ref 135–145)

## 2018-10-07 MED ORDER — HYDROCODONE-ACETAMINOPHEN 7.5-325 MG/15ML PO SOLN: 10.0000 mL | ORAL | 0 refills | Status: DC | PRN

## 2018-10-07 MED FILL — HYDROCOD-APAP 7.5-325/15ML: 7.5-325 | 11 days supply | Qty: 700 | Fill #0

## 2018-10-07 NOTE — Telephone Encounter (Signed)
Oncology Nurse Navigator Documentation  Called Ms. Benedick in follow-up to VMM left 10:49 pm last evening stating she 1) needs hydrocodone refill, and 2) experiencing bipedal neuropathy, "burning, stinging".  LVMM indicating she needs to be seen by Dr. Isidore Moos for evaluation before pain med can be reissued, for assessment of neuropathy.  I provided her a 3:00 pm appt, asked her to return my call to confirm message receipt.  Dr. Isidore Moos and LINAC 1 informed.  Gayleen Orem, RN, BSN Head & Neck Oncology Nurse [place] at [place] [phone]

## 2018-10-07 NOTE — Telephone Encounter (Signed)
Oncology Nurse Navigator Documentation  Ms. Knape returned my call, agreed to arrive 3:00 to see Dr. Isidore Moos.  I informed [facility] Transportation Coordinator Ginette Otto who arranged ride.  Gayleen Orem, RN, BSN Head & Neck Oncology Nurse [place] at [place] [phone]

## 2018-10-07 NOTE — Progress Notes (Signed)
Oncology Nurse Navigator Documentation  Met with [name] during weekly PUT with Dr. Isidore Moos. She reported:  Inability to eat soft foods since Friday d/t constant throat pain.  Taking hydrocodone 15 mL q4h.  Pain with walking.  Drinking 2-3 Ensure, several cups of coffee and 3-4 8 oz of bottled water daily.  Applying Sonafine and lidocaine get to treatment area.  Smoking a single cigarette every couple of days, removes nicotine patch before and reapplies after.  I advised her on correct procedure for wearing patch, encouraged her to quit smoking entirely as it contributes to her throat soreness.  Sleeping in recliner, able to get 4 hrs of sleep before waking with pain or "choking" on thick saliva.  Using younker suction.  She received Rx for hydrocodone which I picked up at [facility] on her behalf as she obtained post-app labs.  Alphonzo Grieve, Engineer, building services, witnessed my delivery to [name]. She voiced understanding of follow-up with Dr. Isidore Moos 10/21/18.  Gayleen Orem, RN, BSN Head & Neck Oncology Nurse [place] at [place] [phone]

## 2018-10-08 ENCOUNTER — Ambulatory Visit: Payer: Medicaid Other

## 2018-10-08 ENCOUNTER — Ambulatory Visit: Payer: Self-pay

## 2018-10-09 ENCOUNTER — Ambulatory Visit: Admission: RE | Admit: 2018-10-09 | Payer: Medicaid Other | Source: Ambulatory Visit

## 2018-10-09 ENCOUNTER — Ambulatory Visit: Payer: Self-pay

## 2018-10-10 ENCOUNTER — Ambulatory Visit: Payer: Self-pay

## 2018-10-10 ENCOUNTER — Encounter: Payer: Self-pay | Admitting: Radiation Oncology

## 2018-10-10 NOTE — Progress Notes (Signed)
  Radiation Oncology         [phone] ________________________________  Name: Sheri Rivera MRN: [id number]  Date: 10/10/2018  DOB: Apr 27, 1961  End of Treatment Note  Diagnosis:   Cancer Staging Malignant neoplasm of supraglottis ([place]) Staging form: Larynx - Supraglottis, AJCC 8th Edition - Clinical: Stage IVA (cT3, cN2b, cM0) - Signed by Eppie Gibson, MD on 07/30/2018     Indication for treatment:  Curative       Radiation treatment dates:   08/12/2018 - 10/03/2018  Site/dose:   Larynx and bilateral neck/ prescribed dose of 70 Gy in 35 fractions; however, her fractionation was changed due to patient's unwillingness to come in for a full course of treatment. She stated intolerance of side effects.  Due to multiple missed treatments, Dr. Isidore Moos changed the fractionation after her 18th fraction.   First 36Gy was given in 18 fractions at 2 Gy/ fraction.  These were given with multiple breaks taken by the patient AMA.  Remaining 24.3Gy was given in 9 fractions at 2.7Gy/ fraction.  These were given with multiple breaks taken by the patient AMA.  A final fraction of 2.7Gy was refused by the patient.  Total dose received: 60.3Gy, varied fractionation, in 27 fractions.  Beams/energy:   IMRT, Photons / 6X  Narrative: The patient tolerated radiation treatment poorly. She intermittently missed appointments, and this worsened as time went on. She reported thick saliva, increasing trouble swallowing, and increasing mouth/tongue soreness and throat/skin pain throughout treatment. She chose to ignore medical advice regarding missing appointments, needing IVF,  meeting with a nutritionist, smoking cessation, and hospital admission.  She chose to stop treatments after 27 fractions.  Plan: The patient has chosen to discontinue radiation treatment. The patient will return to radiation oncology clinic for routine followup in one half month. I advised them to call or return sooner if they have any  questions or concerns related to their recovery or treatment.  -----------------------------------  Eppie Gibson, MD  This document serves as a record of services personally performed by Eppie Gibson, MD. It was created on his behalf by Wilburn Mylar, a trained medical scribe. The creation of this record is based on the scribe's personal observations and the provider's statements to them. This document has been checked and approved by the attending provider.

## 2018-10-15 ENCOUNTER — Other Ambulatory Visit: Payer: Self-pay | Admitting: Radiation Oncology

## 2018-10-15 ENCOUNTER — Telehealth: Payer: Self-pay | Admitting: *Deleted

## 2018-10-15 NOTE — Telephone Encounter (Signed)
Oncology Nurse Navigator Documentation  Received call from Sheri Rivera with "good news".  She reported:  Up until earlier this week she was only drinking Ensure.  She went to grocery store yesterday to buy foods she previously could eat.  She has been able to eat pimento cheese on Ritz, hamburger on bread, bacon, fried eggs, cinnamon toast; drinking water, all without difficulty.    Has not needed hydrocodone since yesterday morning.  Has not smoked any cigarettes this week. She stated she put on last nicotine patch today, requested refill be sent to [facility]. I confirmed her understanding of 11/26 3:00 post-tmt follow-up with Dr. Isidore Moos. I thanked her for the update, encouraged her to call me with future needs/concerns.  Dr. Isidore Moos and Nutritionist provided update.  Sheri Orem, RN, BSN Head & Neck Oncology Nurse [place] at [place] [phone]

## 2018-10-16 ENCOUNTER — Encounter: Payer: Self-pay | Admitting: Radiation Oncology

## 2018-10-16 ENCOUNTER — Telehealth: Payer: Self-pay | Admitting: *Deleted

## 2018-10-16 MED FILL — NICOTINE 21 MG/24HR PATCH: 21 | 28 days supply | Qty: 28 | Fill #0

## 2018-10-16 NOTE — Telephone Encounter (Signed)
Oncology Nurse Navigator Documentation  In follow-up to Ms. Rump's VMM indicating she has been using 21 mg nicotine patch, placed verbal order to [facility] per Dr. Isidore Moos for 21 mg Rx, 6 weeks worth of patches.  Called Ms. Witte to inform available for pick-up.  Gayleen Orem, RN, BSN Head & Neck Oncology Nurse [place] at [place] [phone]

## 2018-10-21 ENCOUNTER — Ambulatory Visit
Admission: RE | Admit: 2018-10-21 | Discharge: 2018-10-21 | Disposition: A | Discharge status: Home / Self Care | Payer: Medicaid Other | Source: Ambulatory Visit | Attending: Radiation Oncology | Admitting: Radiation Oncology

## 2018-10-21 ENCOUNTER — Telehealth: Payer: Self-pay | Admitting: *Deleted

## 2018-10-21 HISTORY — DX: Personal history of irradiation: Z92.3

## 2018-10-21 NOTE — Telephone Encounter (Signed)
CALLED PATIENT TO INFORM OF FU FOR 10-31-18 @ 3 PM WITH DR. Isidore Moos, LVM FOR A RETURN CALL

## 2018-10-26 DIAGNOSIS — C321 Malignant neoplasm of supraglottis: Secondary | ICD-10-CM | POA: Diagnosis not present

## 2018-10-28 ENCOUNTER — Telehealth: Payer: Self-pay | Admitting: Radiation Oncology

## 2018-10-28 NOTE — Telephone Encounter (Signed)
Spoke with patient and scheduled rides for meeting with [place] 12/4 and Dr. Isidore Moos 12/6.

## 2018-10-29 NOTE — Progress Notes (Signed)
Sheri Rivera presents for follow up of radiation completed 10/03/18 to her Larynx.   Pain issues, if any: She reports pain to the base of her neck. She is taking ibuprofen and a heating pad for this pain.  Using a feeding tube?: No Weight changes, if any:  Wt Readings from Last 3 Encounters:  10/31/18 167 lb (75.8 kg)  09/18/18 179 lb 6.4 oz (81.4 kg)  09/16/18 183 lb 3.2 oz (83.1 kg)   Swallowing issues, if any: She denies difficulty swallowing.  Smoking or chewing tobacco? She had quit for a couple of weeks, but restarted 2 days ago due to stressors with her son.  Using fluoride trays daily? No Last ENT visit was on: Not since diagnosis.  Other notable issues, if any:  She reports taste changes which have prevented her from eating well the past few weeks.    BP 116/81 (BP Location: Left Arm, Patient Position: Sitting)   Pulse 71   Temp 97.8 F (36.6 C) (Oral)   Resp 20   Ht 5\' 3"  (1.6 m)   Wt 167 lb (75.8 kg)   SpO2 95%   BMI 29.58 kg/m

## 2018-10-31 ENCOUNTER — Other Ambulatory Visit: Payer: Self-pay

## 2018-10-31 ENCOUNTER — Encounter: Payer: Self-pay | Admitting: Radiation Oncology

## 2018-10-31 ENCOUNTER — Ambulatory Visit
Admission: RE | Admit: 2018-10-31 | Discharge: 2018-10-31 | Disposition: A | Discharge status: Home / Self Care | Payer: Medicaid Other | Source: Ambulatory Visit | Attending: Radiation Oncology | Admitting: Radiation Oncology

## 2018-10-31 DIAGNOSIS — C321 Malignant neoplasm of supraglottis: Secondary | ICD-10-CM | POA: Diagnosis present

## 2018-10-31 DIAGNOSIS — Z79899 Other long term (current) drug therapy: Secondary | ICD-10-CM | POA: Insufficient documentation

## 2018-10-31 NOTE — Progress Notes (Signed)
Radiation Oncology         (336) [phone] ________________________________  Name: Sheri Rivera MRN: [id number]  Date: 10/31/2018  DOB: 1961/09/21  Follow-Up Visit Note  CC: Vonna Drafts, FNP  [name] E, *  Diagnosis and Prior Radiotherapy:       ICD-10-CM   1. 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  She received an incomplete course of RT at varied fractionation 4 weeks ago, declining to receive all recommended treatment, and missing multiple visits, due to social stressors and acute effects.  At times, she made decisions AMA.  She felte much better a couple weeks after RT. Recently she has had relapse in her dysgeusia which causes gagging and food intolerance at times.  Pain issues, if any: She reports improved pain overall but some persists to the base of her neck. She is taking ibuprofen and a heating pad for this pain.  Using a feeding tube?: No Weight changes, if any:  Wt Readings from Last 3 Encounters:  10/31/18 167 lb (75.8 kg)  09/18/18 179 lb 6.4 oz (81.4 kg)  09/16/18 183 lb 3.2 oz (83.1 kg)   Swallowing issues, if any: She denies difficulty swallowing.  Smoking or chewing tobacco? She had quit for a couple of weeks, but restarted 2 days ago due to stressors with her son.  Using fluoride trays daily? No Last ENT visit was on: Not since diagnosis.   ALLERGIES:  is allergic to clindamycin/lincomycin; cymbalta [duloxetine hcl]; levaquin [levofloxacin in d5w]; lyrica [pregabalin]; shellfish allergy; sulfamethoxazole; and codeine.  Meds: Current Outpatient Medications  Medication Sig Dispense Refill  . albuterol (PROVENTIL  HFA;VENTOLIN HFA) 108 (90 BASE) MCG/ACT inhaler Inhale 2 puffs into the lungs every 6 (six) hours as needed for shortness of breath. Shortness of breath    . albuterol (PROVENTIL) (2.5 MG/3ML) 0.083% nebulizer solution Take 3 mLs (2.5 mg total) by nebulization every 4 (four) hours as needed for wheezing or shortness of breath. Shortness of breath 25 vial 0  . ibuprofen (ADVIL,MOTRIN) 600 MG tablet Take 600 mg by mouth 2 (two) times daily.    Marland Kitchen LORazepam (ATIVAN) 1 MG tablet Take 0.5 tablets (0.5 mg total) by mouth every 8 (eight) hours as needed for anxiety. 30 tablet 0  . simethicone (GAS-X) 80 MG chewable tablet Chew 160 mg by mouth daily.     . calcium carbonate (TUMS - DOSED IN MG ELEMENTAL CALCIUM) 500 MG chewable tablet Chew 1 tablet by mouth daily as needed for indigestion or heartburn.    . fentaNYL (DURAGESIC) 12 MCG/HR Place 1 patch (12.5 mcg total) onto the skin every 3 (three) days. (Patient not taking: Reported on 10/31/2018) 10 patch 0  . fexofenadine-pseudoephedrine (ALLEGRA-D 24) 180-240 MG 24 hr tablet Take 1 tablet by mouth daily.    Marland Kitchen HYDROcodone-acetaminophen (HYCET) 7.5-325 mg/15 ml solution Take 10-15 mLs by mouth every 4 (four) hours as needed for moderate pain. (Patient not taking: Reported on 10/31/2018) 700 mL 0  . lidocaine (XYLOCAINE) 2 % solution Patient: Mix 1part 2% viscous lidocaine, 1part H20. Swish & swallow 103mL of diluted mixture, 78min before meals and at bedtime, up to QID (Patient not  taking: Reported on 10/31/2018) 100 mL 5  . loratadine (CLARITIN) 5 MG/5ML syrup Take 10 mLs (10 mg total) by mouth daily. (Patient not taking: Reported on 10/31/2018) 180 mL 12  . Nutritional Supplements (ENSURE HIGH PROTEIN) LIQD Take 1 Can by mouth as needed. (Patient not taking: Reported on 10/31/2018) 36 Can 20  . predniSONE (PREDNISONE INTENSOL) 5 MG/ML concentrated solution 4 ml x 2 days, 3 ml x 2 days, 2 ml x 2 days, 1 ml x 2 days, then stop, 0.5 ml x 2 days (Patient not taking:  Reported on 10/31/2018) 30 mL 0  . scopolamine (TRANSDERM-SCOP) 1 MG/3DAYS Place 1 patch (1.5 mg total) onto the skin every 3 (three) days. (Patient not taking: Reported on 09/16/2018) 10 patch 0  . sertraline (ZOLOFT) 50 MG tablet Take 1 tablet (50 mg total) by mouth daily. (Patient not taking: Reported on 10/31/2018) 30 tablet 3  . sucralfate (CARAFATE) 1 g tablet Dissolve 1 tablet in 10 mL H20 and swallow 30 min prior to meals and bedtime. (Patient not taking: Reported on 10/31/2018) 40 tablet 4   No current facility-administered medications for this encounter.     Physical Findings: The patient is in no acute distress. Patient is alert and oriented. Wt Readings from Last 3 Encounters:  10/31/18 167 lb (75.8 kg)  09/18/18 179 lb 6.4 oz (81.4 kg)  09/16/18 183 lb 3.2 oz (83.1 kg)    height is 5\' 3"  (1.6 m) and weight is 167 lb (75.8 kg). Her oral temperature is 97.8 F (36.6 C). Her blood pressure is 116/81 and her pulse is 71. Her respiration is 20 and oxygen saturation is 95%. .  General: Alert and oriented, in no acute distress HEENT: Head is normocephalic. Extraocular movements are intact. Oropharynx is notable for thick saliva, no lesions Neck: Neck is notable for no masses palpated Skin: Skin in treatment fields shows satisfactory healing with mild erythema Lymphatics: see Neck Exam  Lab Findings: Lab Results  Component Value Date   WBC 9.0 06/29/2018   HGB 16.2 (H) 06/29/2018   HCT 48.3 (H) 06/29/2018   MCV 94.2 06/29/2018   PLT 272 06/29/2018    Lab Results  Component Value Date   TSH 0.511 08/05/2018    Radiographic Findings: No results found.  Impression/Plan:    1) Head and Neck Cancer Status: healing from RT  2) Nutritional Status: lost weight due to gagging r/t relapse in dysgeusia PEG tube: has refused this  Pt has no showed for nutrition appts but is now willing to see Dory Peru. Will make referral  3) Risk Factors: The patient has been educated about  risk factors including alcohol and tobacco abuse; they understand that avoidance of alcohol and tobacco is important to prevent recurrences as well as other cancers  Smoking again, AMA.  She understands the risks of this.  4) Swallowing: functional, not complying with SLP visits/exercises. Unsure if willing to see SLP for prophylaxis against dysphagia.  I told her I'll ask Gayleen Orem, RN, our Head and Neck Oncology Navigator to see if she changes her mind and will see Glendell Docker at [facility]  5) Dental: Encouraged to continue regular followup with dentistry, and dental hygiene including fluoride rinses. Will refer back to Dr Enrique Sack  6) Thyroid function:  Lab Results  Component Value Date   TSH 0.511 08/05/2018    7) Other: Considering whether she is willing to see PT at [facility], as well.  Gayleen Orem, RN, our Head and Neck  Oncology Navigator will contact her  8) Follow-up in 2 months with restaging PET.  Patient knows she must call if she cannot take in food/drink. The patient was encouraged to call with any issues or questions before then.  _____________________________________   Eppie Gibson, MD   This document serves as a record of services personally performed by Eppie Gibson, MD. It was created on her behalf by Arlyce Harman, a trained medical scribe. The creation of this record is based on the scribe's personal observations and the provider's statements to them. This document has been checked and approved by the attending provider.

## 2018-11-01 ENCOUNTER — Encounter: Payer: Self-pay | Admitting: Radiation Oncology

## 2018-11-01 ENCOUNTER — Other Ambulatory Visit: Payer: Self-pay | Admitting: Radiation Oncology

## 2018-11-01 DIAGNOSIS — C321 Malignant neoplasm of supraglottis: Secondary | ICD-10-CM

## 2018-11-01 DIAGNOSIS — R634 Abnormal weight loss: Secondary | ICD-10-CM

## 2018-11-03 ENCOUNTER — Telehealth: Payer: Self-pay | Admitting: *Deleted

## 2018-11-03 NOTE — Telephone Encounter (Signed)
CALLED PATIENT TO INFORM OF NUTRITION APPT. FOR  11-10-18 @ 3:15 PM, LVM FOR A RETURN CALL

## 2018-11-04 ENCOUNTER — Telehealth: Payer: Self-pay | Admitting: *Deleted

## 2018-11-04 NOTE — Telephone Encounter (Signed)
Oncology Nurse Navigator Documentation  Rec'd call from Ms. Monia Pouch.  She stated she received VMM from Dental Medicine re scheduling of appt, questioned need when she has own dentist.  I explained visit is appropriate for specialized post-RT follow-up/education, she can see PCD subsequently.  She voiced understanding.  I confirmed her understanding of 12/16 3:15 Nutrition appt.  Gayleen Orem, RN, BSN Head & Neck Oncology Nurse [place] at [place] [phone]

## 2018-11-05 ENCOUNTER — Telehealth: Payer: Self-pay | Admitting: *Deleted

## 2018-11-06 NOTE — Telephone Encounter (Signed)
Oncology Nurse Navigator Documentation  Rec'd VMM from Ms Arrighi indicating she had appt with therapist yesterday who recommended she continue with Ativan and Zoloft.  She noted she is changing PCP, has an appt with him in late January but until then needs refills.  She asks that [facility] providers submit refills to [place].  Drs. Isidore Moos and Pavo notified.  Gayleen Orem, RN, BSN Head & Neck Oncology Nurse [place] at [place] [phone]

## 2018-11-07 ENCOUNTER — Other Ambulatory Visit: Payer: Self-pay | Admitting: Radiation Oncology

## 2018-11-07 DIAGNOSIS — C321 Malignant neoplasm of supraglottis: Secondary | ICD-10-CM

## 2018-11-07 MED ORDER — LORAZEPAM 0.5 MG PO TABS: ORAL_TABLET | ORAL | 0 refills | Status: AC

## 2018-11-07 MED FILL — LORazepam 0.5 MG TABS: 0.5 | 20 days supply | Qty: 20 | Fill #0

## 2018-11-10 ENCOUNTER — Inpatient Hospital Stay: Payer: Medicaid Other | Attending: Radiation Oncology | Admitting: Nutrition

## 2018-11-11 ENCOUNTER — Encounter: Payer: Self-pay | Admitting: Nutrition

## 2018-11-11 NOTE — Progress Notes (Signed)
Patient did not show up for nutrition appointment on Dec 16.

## 2018-11-18 ENCOUNTER — Inpatient Hospital Stay: Admission: RE | Admit: 2018-11-18 | Payer: Self-pay | Source: Ambulatory Visit | Admitting: Radiation Oncology

## 2018-11-18 NOTE — Progress Notes (Signed)
Ms. Jump called me on 11/17/18 at 3:30 pm. She reported discomfort in her throat. Her throat felt tight and swollen. She is very anxious about her symptoms. She was unable to get an appointment with her ENT MD due to vacation and insurance issues. She requested to be seen by Dr. Isidore Moos. Dr. Isidore Moos agreed to see her today 11/18/18 for evaluation. Ms. Scripter was aware to present to the Emergency Department if she developed difficulty breathing. Ms. Wiesen did not come for her requested appointment today with Dr. Isidore Moos. I was unable to reach her today to inquire about her missed appointment today.

## 2018-11-20 NOTE — Progress Notes (Signed)
Sheri Rivera presents for follow up of radiation completed to her supraglottis.   Pain issues, if any: She reports severe pain to her tongue. She also reports pain to the right side of her face and right collarbone area.  Using a feeding tube?: N/A Weight changes, if any:  Wt Readings from Last 3 Encounters:  11/21/18 162 lb 9.6 oz (73.8 kg)  10/31/18 167 lb (75.8 kg)  09/18/18 179 lb 6.4 oz (81.4 kg)   Swallowing issues, if any: She tells me that she has not eaten in 4 days. She denies difficulty swallowing. She reports that taste changes have stopped her from wanting to eat most of the time. She is not drinking ensure or any nutritional supplements at this time.  Smoking or chewing tobacco? She tells me that she smoked 3 cigarettes on 11/19/18 and 11/20/18. Previously her last cigarette was 2 weeks before those two days.  Using fluoride trays daily? No, she tells me that she has never picked them up.  Last ENT visit was on: N/A. She is unable to schedule an appointment with ENT due to vacation and insurance issues.  Other notable issues, if any:   BP 120/76   Pulse 78   Temp 98.2 F (36.8 C) (Oral)   Wt 162 lb 9.6 oz (73.8 kg)   SpO2 97% Comment: room air  BMI 28.80 kg/m

## 2018-11-21 ENCOUNTER — Encounter: Payer: Self-pay | Admitting: Radiation Oncology

## 2018-11-21 ENCOUNTER — Other Ambulatory Visit: Payer: Self-pay

## 2018-11-21 ENCOUNTER — Ambulatory Visit
Admission: RE | Admit: 2018-11-21 | Discharge: 2018-11-21 | Disposition: A | Discharge status: Home / Self Care | Payer: Medicaid Other | Source: Ambulatory Visit | Attending: Radiation Oncology | Admitting: Radiation Oncology

## 2018-11-21 VITALS — BP 120/76 | HR 78 | Temp 98.2°F | Wt 162.6 lb

## 2018-11-21 DIAGNOSIS — C321 Malignant neoplasm of supraglottis: Secondary | ICD-10-CM

## 2018-11-21 DIAGNOSIS — F17210 Nicotine dependence, cigarettes, uncomplicated: Secondary | ICD-10-CM | POA: Insufficient documentation

## 2018-11-21 DIAGNOSIS — Z79899 Other long term (current) drug therapy: Secondary | ICD-10-CM | POA: Insufficient documentation

## 2018-11-21 MED ORDER — FLUCONAZOLE 100 MG PO TABS: ORAL_TABLET | ORAL | 0 refills | Status: DC

## 2018-11-21 MED ORDER — LARYNGOSCOPY SOLUTION RAD-ONC
15.0000 mL | Freq: Once | TOPICAL | Status: AC
  Administered 2018-11-21: 15 mL via TOPICAL
  Filled 2018-11-21: qty 15

## 2018-11-21 NOTE — Progress Notes (Signed)
Radiation Oncology         (336) [phone] ________________________________  Name: Sheri Rivera MRN: [id number]  Date: 11/21/2018  DOB: 1960/12/11  Follow-Up Visit Note  CC: Vonna Drafts, FNP  [name] E, *  Diagnosis and Prior Radiotherapy:       ICD-10-CM   1. 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  CHIEF COMPLAINT:  Here for follow-up and surveillance of laryngeal cancer  Narrative:  The patient returns today for routine follow-up. She reports she has not eaten the last 4 days because of her severe tongue pain. She is drinking water. She denies ingesting any nutritional supplements. She reports "feeling like bugs constantly" in her right ear.   Pain issues, if any: She reports severe pain to right inner ear, right neck, right collarbone area, and tongue. Using a feeding tube?: No Weight changes, if any:  Wt Readings from Last 3 Encounters:  11/21/18 162 lb 9.6 oz (73.8 kg)  10/31/18 167 lb (75.8 kg)  09/18/18 179 lb 6.4 oz (81.4 kg)   Swallowing issues, if any: She denies difficulty swallowing.  Smoking or chewing tobacco? Yes, intermittently. She states she smoked 3 cigarettes on 11/19/18 and 11/20/18 after not smoking for 2 weeks. Using fluoride trays daily? No, she states she never picked them up. Last ENT visit was on: N/A due to issues with insurance.  Of note, the patient was very anxious on 11/17/18 for an urgent appointment. We contacted her that afternoon with an appointment for 11/18/18. Patient did not show for that appointment.  ALLERGIES:  is allergic to clindamycin/lincomycin; cymbalta [duloxetine hcl]; levaquin [levofloxacin in d5w]; lyrica [pregabalin]; shellfish allergy; sulfamethoxazole; and codeine.  Meds: Current  Outpatient Medications  Medication Sig Dispense Refill  . albuterol (PROVENTIL HFA;VENTOLIN HFA) 108 (90 BASE) MCG/ACT inhaler Inhale 2 puffs into the lungs every 6 (six) hours as needed for shortness of breath. Shortness of breath    . albuterol (PROVENTIL) (2.5 MG/3ML) 0.083% nebulizer solution Take 3 mLs (2.5 mg total) by nebulization every 4 (four) hours as needed for wheezing or shortness of breath. Shortness of breath 25 vial 0  . calcium carbonate (TUMS - DOSED IN MG ELEMENTAL CALCIUM) 500 MG chewable tablet Chew 1 tablet by mouth daily as needed for indigestion or heartburn.    . fexofenadine-pseudoephedrine (ALLEGRA-D 24) 180-240 MG 24 hr tablet Take 1 tablet by mouth daily.    Marland Kitchen ibuprofen (ADVIL,MOTRIN) 600 MG tablet Take 600 mg by mouth 2 (two) times daily.    Marland Kitchen LORazepam (ATIVAN) 0.5 MG tablet Take 1 tab PRN extreme anxiety, not to exceed once daily. Use sparingly. Future Rx only at the discretion of the primary care provider. 20 tablet 0  . sertraline (ZOLOFT) 50 MG tablet Take 1 tablet (50 mg total) by mouth daily. 30 tablet 3  . simethicone (GAS-X) 80 MG chewable tablet Chew 160 mg by mouth daily.     . fentaNYL (DURAGESIC) 12 MCG/HR Place 1 patch (12.5 mcg total) onto the skin every 3 (three) days. (Patient not taking: Reported on 10/31/2018) 10 patch 0  . fluconazole (DIFLUCAN) 100 MG tablet Take 2 tablets today, then 1 tablet daily x 13 more days. 15 tablet 0  . HYDROcodone-acetaminophen (HYCET) 7.5-325 mg/15 ml solution Take 10-15 mLs by mouth  every 4 (four) hours as needed for moderate pain. (Patient not taking: Reported on 10/31/2018) 700 mL 0  . lidocaine (XYLOCAINE) 2 % solution Patient: Mix 1part 2% viscous lidocaine, 1part H20. Swish & swallow 41mL of diluted mixture, 12min before meals and at bedtime, up to QID (Patient not taking: Reported on 10/31/2018) 100 mL 5  . loratadine (CLARITIN) 5 MG/5ML syrup Take 10 mLs (10 mg total) by mouth daily. (Patient not taking: Reported on  10/31/2018) 180 mL 12  . Nutritional Supplements (ENSURE HIGH PROTEIN) LIQD Take 1 Can by mouth as needed. (Patient not taking: Reported on 10/31/2018) 36 Can 20  . predniSONE (PREDNISONE INTENSOL) 5 MG/ML concentrated solution 4 ml x 2 days, 3 ml x 2 days, 2 ml x 2 days, 1 ml x 2 days, then stop, 0.5 ml x 2 days (Patient not taking: Reported on 10/31/2018) 30 mL 0  . scopolamine (TRANSDERM-SCOP) 1 MG/3DAYS Place 1 patch (1.5 mg total) onto the skin every 3 (three) days. (Patient not taking: Reported on 09/16/2018) 10 patch 0  . sucralfate (CARAFATE) 1 g tablet Dissolve 1 tablet in 10 mL H20 and swallow 30 min prior to meals and bedtime. (Patient not taking: Reported on 10/31/2018) 40 tablet 4   No current facility-administered medications for this encounter.     Physical Findings: The patient is in no acute distress. Patient is alert and oriented. Wt Readings from Last 3 Encounters:  11/21/18 162 lb 9.6 oz (73.8 kg)  10/31/18 167 lb (75.8 kg)  09/18/18 179 lb 6.4 oz (81.4 kg)    weight is 162 lb 9.6 oz (73.8 kg). Her oral temperature is 98.2 F (36.8 C). Her blood pressure is 120/76 and her pulse is 78. Her oxygen saturation is 97%. .  General: Alert and oriented, in no acute distress HEENT: Head is normocephalic. Extraocular movements are intact. Patchy tan raised change over the surface of the tongue, which looks like resolving mucositis. Mucus membranes are somewhat dry, no obvious thrush in her mouth. Upper oropharynx is clear. Right tympanic membrane appears clear. Neck: Tenderness to palpation on the right neck and right collarbone. Mild fullness in the right neck but no obvious masses palpated. Lymphatics: see Neck Exam Neurologic: Cranial nerves II through XII are grossly intact. No obvious focalities. Speech is fluent. Coordination is intact. Psychiatric: Judgment and insight are intact. Affect is appropriate.   PROCEDURE NOTE: After obtaining consent and anesthetizing the nasal cavity  with topical lidocaine and phenylephrine, the flexible endoscope was introduced and passed through the nasal cavity.  The pharynx and larynx are without lesions with the exception of right false cord that is somewhat edematous with  a papillary-appearing surface. The right true cord has a whitish nodule vs white mucus on it. The airway is patent. She has a lot of mucus in her throat.   Lab Findings: Lab Results  Component Value Date   WBC 9.0 06/29/2018   HGB 16.2 (H) 06/29/2018   HCT 48.3 (H) 06/29/2018   MCV 94.2 06/29/2018   PLT 272 06/29/2018    Lab Results  Component Value Date   TSH 0.511 08/05/2018    Radiographic Findings: No results found.  Impression/Plan:    1) Head and Neck Cancer Status: possible persistent disease vs incomplete healing due to recent treatment.    2) Nutritional Status: Encouraged to push po intake despite taste changes and discomfort/pain. PEG tube: has refused this  Pt has no showed for nutrition appts.  3) Risk Factors:  The patient has been educated about risk factors including alcohol and tobacco abuse; they understand that avoidance of alcohol and tobacco is important to prevent recurrences as well as other cancers Smoking again, AMA.  She understands the risks of this.  4) Swallowing: Good, with the exception of tongue pain.  5) Dental: Encouraged to continue regular followup with dentistry, and dental hygiene including fluoride rinses.   6) Thyroid function: WNL Lab Results  Component Value Date   TSH 0.511 08/05/2018    7) Other: I'll ask Gayleen Orem, RN, our Head and Neck Oncology Navigator to contact her about setting her up with a new ENT that takes Medicaid, as her previous does not. I'll prescribe fluconazole to [facility] [address] [facility]as she states this improved her mouth pain in the past.  8) Follow-up on 01/02/19 with restaging PET - earlier PET could lead to false positive. See ENT in 3 wks to monitor closely. The patient was encouraged  to call with any issues or questions before then.  _____________________________________   Eppie Gibson, MD  This document serves as a record of services personally performed by Eppie Gibson, MD. It was created on her behalf by Wilburn Mylar, a trained medical scribe. The creation of this record is based on the scribe's personal observations and the provider's statements to them. This document has been checked and approved by the attending provider.

## 2018-11-24 ENCOUNTER — Telehealth: Payer: Self-pay | Admitting: *Deleted

## 2018-11-24 NOTE — Telephone Encounter (Signed)
Oncology Nurse Navigator Documentation  Per patient's 11/21/18 post-treatment follow-up with Dr. Isidore Moos, called [facility] to coordinate appointment.  Spoke with [facility], requested patient be contacted and scheduled for post-tmt follow-up in 3 weeks with Dr. Constance Holster, requested on-going surveillance with Dr. Constance Holster.  She verbalized understanding.  Gayleen Orem, RN, BSN Head & Neck Oncology Nurse [place] at [place] [phone]

## 2018-11-26 DIAGNOSIS — C321 Malignant neoplasm of supraglottis: Secondary | ICD-10-CM | POA: Diagnosis not present

## 2018-11-27 ENCOUNTER — Emergency Department (HOSPITAL_COMMUNITY)
Admission: EM | Admit: 2018-11-27 | Discharge: 2018-11-27 | Disposition: A | Discharge status: Home / Self Care | Payer: Medicaid Other | Attending: Emergency Medicine | Admitting: Emergency Medicine

## 2018-11-27 ENCOUNTER — Encounter (HOSPITAL_COMMUNITY): Payer: Self-pay | Admitting: *Deleted

## 2018-11-27 ENCOUNTER — Other Ambulatory Visit: Payer: Self-pay

## 2018-11-27 DIAGNOSIS — J0100 Acute maxillary sinusitis, unspecified: Secondary | ICD-10-CM

## 2018-11-27 DIAGNOSIS — Z79899 Other long term (current) drug therapy: Secondary | ICD-10-CM | POA: Diagnosis not present

## 2018-11-27 DIAGNOSIS — J449 Chronic obstructive pulmonary disease, unspecified: Secondary | ICD-10-CM | POA: Diagnosis not present

## 2018-11-27 DIAGNOSIS — R0981 Nasal congestion: Secondary | ICD-10-CM | POA: Diagnosis not present

## 2018-11-27 DIAGNOSIS — R51 Headache: Secondary | ICD-10-CM | POA: Insufficient documentation

## 2018-11-27 DIAGNOSIS — J45909 Unspecified asthma, uncomplicated: Secondary | ICD-10-CM | POA: Diagnosis not present

## 2018-11-27 DIAGNOSIS — F17210 Nicotine dependence, cigarettes, uncomplicated: Secondary | ICD-10-CM | POA: Diagnosis not present

## 2018-11-27 DIAGNOSIS — Z8589 Personal history of malignant neoplasm of other organs and systems: Secondary | ICD-10-CM | POA: Diagnosis not present

## 2018-11-27 LAB — CBC WITH DIFFERENTIAL/PLATELET
Abs Immature Granulocytes: 0.02 10*3/uL (ref 0.00–0.07)
Basophils Absolute: 0.1 10*3/uL (ref 0.0–0.1)
Basophils Relative: 1 %
Eosinophils Absolute: 0.3 10*3/uL (ref 0.0–0.5)
Eosinophils Relative: 4 %
HCT: 46.1 % — ABNORMAL HIGH (ref 36.0–46.0)
Hemoglobin: 15.2 g/dL — ABNORMAL HIGH (ref 12.0–15.0)
Immature Granulocytes: 0 %
Lymphocytes Relative: 15 %
Lymphs Abs: 0.9 10*3/uL (ref 0.7–4.0)
MCH: 31.9 pg (ref 26.0–34.0)
MCHC: 33 g/dL (ref 30.0–36.0)
MCV: 96.6 fL (ref 80.0–100.0)
Monocytes Absolute: 0.5 10*3/uL (ref 0.1–1.0)
Monocytes Relative: 8 %
Neutro Abs: 4.5 10*3/uL (ref 1.7–7.7)
Neutrophils Relative %: 72 %
Platelets: 253 10*3/uL (ref 150–400)
RBC: 4.77 MIL/uL (ref 3.87–5.11)
RDW: 13 % (ref 11.5–15.5)
WBC: 6.3 10*3/uL (ref 4.0–10.5)
nRBC: 0 % (ref 0.0–0.2)

## 2018-11-27 LAB — BASIC METABOLIC PANEL
Anion gap: 8 (ref 5–15)
BUN: 10 mg/dL (ref 6–20)
CO2: 23 mmol/L (ref 22–32)
Calcium: 8.9 mg/dL (ref 8.9–10.3)
Chloride: 107 mmol/L (ref 98–111)
Creatinine, Ser: 0.57 mg/dL (ref 0.44–1.00)
GFR calc Af Amer: >60 mL/min (ref >60)
GFR calc non Af Amer: >60 mL/min (ref >60)
Glucose, Bld: 98 mg/dL (ref 70–99)
Potassium: 3.7 mmol/L (ref 3.5–5.1)
Sodium: 138 mmol/L (ref 135–145)

## 2018-11-27 MED ORDER — AMOXICILLIN-POT CLAVULANATE 875-125 MG PO TABS
1.0000 | ORAL_TABLET | Freq: Once | ORAL | Status: AC
  Administered 2018-11-27: 1 via ORAL
  Filled 2018-11-27: qty 1

## 2018-11-27 MED ORDER — AMOXICILLIN-POT CLAVULANATE 875-125 MG PO TABS: 1.0000 | ORAL_TABLET | Freq: Two times a day (BID) | ORAL | 0 refills | Status: DC

## 2018-11-27 NOTE — Discharge Instructions (Signed)
Take augmentin twice daily for a week.   See your doctor for follow up next week   Return to ER if you have worse congestion, purulent discharge from nose, fever.

## 2018-11-27 NOTE — ED Provider Notes (Signed)
[place] DEPT Provider Note   CSN: [id number] Arrival date & time: 11/27/18  1842     History   Chief Complaint Chief Complaint  Patient presents with  . URI    cancer pt    HPI Sheri Rivera is a 58 y.o. female history of COPD, throat cancer status post radiation here presenting with sinus congestion and pressure.  Patient states that she has been having sinus congestion and pressure for the last week or so.  She states that she tried Flonase and Motrin with no relief.  She states that she started having some lowish greenish discharge from her nose.  She denies any fevers.  She had throat cancer and had radiation treatment a month ago.  Patient is not currently undergoing any chemo or radiation.  The history is provided by the patient.    Past Medical History:  Diagnosis Date  . Anxiety 11/11/11  . Arthritis   . Asthma   . Bursitis    right shoulder  . Chronic back pain   . Chronic neck pain   . Chronic shoulder pain   . COPD (chronic obstructive pulmonary disease) ([place])    continues to smoke 1ppd  . DDD (degenerative disc disease)   . Depression   . Fibromyalgia   . GERD (gastroesophageal reflux disease)   . Heart murmur    as a child per pt  . Herpes   . History of radiation therapy 08/12/18- 10/03/18   Larynx, prescribed dose of 63 Gy in 28 fractions. She chose to stop treatments after 27 fractions.   . IBS (irritable bowel syndrome)   . Lumbar radiculopathy   . Seasonal allergies   . Smoker   . Thyroid nodule    goiters    Patient Active Problem List   Diagnosis Date Noted  . 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 Acute respiratory failure with hypoxia ([place]) 08/03/2017  . GERD (gastroesophageal reflux disease) 06/13/2016  . Chronic obstructive pulmonary disease ([place]) 06/13/2016  . Current tobacco use 09/05/2015  . History of fracture of vertebra 02/16/2015  . Diffuse pain 04/03/2014  . Hemorrhage,  postmenopausal 12/29/2013  . Chronic obstructive pulmonary disease with acute exacerbation ([place]) 10/30/2013  . COPD mixed type ([place]) 03/30/2013  . Abdominal pain, epigastric 02/25/2013  . Dysphagia, unspecified(787.20) 02/25/2013  . Gas 02/25/2013  . Abdominal bloating 02/25/2013  . Precordial pain 02/16/2013  . Multinodular goiter 10/05/2012  . 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 Bladder cystocele 07/21/2012  . Clinical depression 11/11/2011  . Allergic rhinitis 11/11/2011  . URI (upper respiratory infection) 09/28/2011  . Sore throat 07/18/2011  . Neck swelling 06/22/2011  . Ingrown right big toenail 05/23/2011  . Chronic back pain 03/16/2011  . 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/05/2010  . Shortness of breath 03/15/2010  . GOITER, MULTINODULAR 07/01/2009  . FIBROMYALGIA 07/01/2009  . ALLERGIC RHINITIS CAUSE UNSPECIFIED 03/02/2008  . Anxiety 01/23/2007  . TOBACCO DEPENDENCE 01/23/2007  . GASTROESOPHAGEAL REFLUX, NO ESOPHAGITIS 01/23/2007    Past Surgical History:  Procedure Laterality Date  . CHOLECYSTECTOMY    . DIRECT LARYNGOSCOPY N/A 07/11/2018   Procedure: DIRECT LARYNGOSCOPY WITH BIOPSY;  Surgeon: Rozetta Nunnery, MD;  Location: [facility];  Service: ENT;  Laterality: N/A;  . HERNIA REPAIR     [place]   . HIATAL HERNIA REPAIR    . TUBAL LIGATION  1983     OB History   No obstetric history on file.  Home Medications    Prior to Admission medications   Medication Sig Start Date End Date Taking? Authorizing Provider  albuterol (PROVENTIL HFA;VENTOLIN HFA) 108 (90 BASE) MCG/ACT inhaler Inhale 2 puffs into the lungs every 6 (six) hours as needed for shortness of breath. Shortness of breath   Yes [provider]  albuterol (PROVENTIL) (2.5 MG/3ML) 0.083% nebulizer solution Take 3 mLs (2.5 mg total) by nebulization every 4 (four) hours as needed for wheezing or shortness of breath. Shortness of breath 08/02/16  Yes Molpus, John, MD    ibuprofen (ADVIL,MOTRIN) 600 MG tablet Take 600 mg by mouth 2 (two) times daily. 10/29/13  Yes [provider]  Phenylephrine-APAP-guaiFENesin (MUCINEX SINUS-MAX CONGESTION) 10-650-400 MG/20ML LIQD Take 30 mLs by mouth every 12 (twelve) hours as needed (congestion).   Yes [provider]  fentaNYL (DURAGESIC) 12 MCG/HR Place 1 patch (12.5 mcg total) onto the skin every 3 (three) days. Patient not taking: Reported on 10/31/2018 09/12/18   Kyung Rudd, MD  fluconazole (DIFLUCAN) 100 MG tablet Take 2 tablets today, then 1 tablet daily x 13 more days. Patient not taking: Reported on 11/27/2018 11/21/18   Eppie Gibson, MD  HYDROcodone-acetaminophen (HYCET) 7.5-325 mg/15 ml solution Take 10-15 mLs by mouth every 4 (four) hours as needed for moderate pain. Patient not taking: Reported on 10/31/2018 10/07/18   Eppie Gibson, MD  lidocaine (XYLOCAINE) 2 % solution Patient: Mix 1part 2% viscous lidocaine, 1part H20. Swish & swallow 53mL of diluted mixture, 56min before meals and at bedtime, up to QID Patient not taking: Reported on 10/31/2018 08/19/18   Eppie Gibson, MD  loratadine (CLARITIN) 5 MG/5ML syrup Take 10 mLs (10 mg total) by mouth daily. Patient not taking: Reported on 10/31/2018 09/16/18   Sandi Mealy E., PA-C  LORazepam (ATIVAN) 0.5 MG tablet Take 1 tab PRN extreme anxiety, not to exceed once daily. Use sparingly. Future Rx only at the discretion of the primary care provider. Patient not taking: Reported on 11/27/2018 11/07/18   Eppie Gibson, MD  Nutritional Supplements (ENSURE HIGH PROTEIN) LIQD Take 1 Can by mouth as needed. Patient not taking: Reported on 10/31/2018 09/01/18   Eppie Gibson, MD  predniSONE (PREDNISONE INTENSOL) 5 MG/ML concentrated solution 4 ml x 2 days, 3 ml x 2 days, 2 ml x 2 days, 1 ml x 2 days, then stop, 0.5 ml x 2 days Patient not taking: Reported on 10/31/2018 09/16/18   Harle Stanford., PA-C  scopolamine (TRANSDERM-SCOP) 1 MG/3DAYS Place 1 patch (1.5 mg  total) onto the skin every 3 (three) days. Patient not taking: Reported on 09/16/2018 08/25/18   Eppie Gibson, MD  sertraline (ZOLOFT) 50 MG tablet Take 1 tablet (50 mg total) by mouth daily. Patient not taking: Reported on 11/27/2018 08/01/18   Truitt Merle, MD  sucralfate (CARAFATE) 1 g tablet Dissolve 1 tablet in 10 mL H20 and swallow 30 min prior to meals and bedtime. Patient not taking: Reported on 10/31/2018 09/23/18   Eppie Gibson, MD    Family History Family History  Problem Relation Age of Onset  . Lung cancer Mother   . Arthritis Mother        Rheumatoid  . Bursitis Mother   . Asthma Mother   . Heart attack Father 68       Died of MI  . Obesity Father   . Fibromyalgia Sister   . Asthma Child     Social History Social History   Tobacco Use  . Smoking status: Current Every  Day Smoker    Packs/day: 1.50    Years: 27.00    Pack years: 40.50    Types: Cigarettes  . Smokeless tobacco: Never Used  . Tobacco comment: she smoked 3 cigaretteson 12/25 and 12/26  Substance Use Topics  . Alcohol use: No  . Drug use: No     Allergies   Clindamycin/lincomycin; Cymbalta [duloxetine hcl]; Levaquin [levofloxacin in d5w]; Lyrica [pregabalin]; Shellfish allergy; Sulfamethoxazole; and Codeine   Review of Systems Review of Systems  HENT: Positive for sinus pressure and sinus pain.   All other systems reviewed and are negative.    Physical Exam Updated Vital Signs BP 122/87 (BP Location: Left Arm)   Pulse 74   Temp 98 F (36.7 C) (Oral)   Resp 16   Ht 5\' 3"  (1.6 m)   Wt 73.5 kg   SpO2 97%   BMI 28.70 kg/m   Physical Exam Vitals signs and nursing note reviewed.  HENT:     Head: Normocephalic.     Right Ear: Tympanic membrane normal.     Left Ear: Tympanic membrane normal.     Nose: Congestion present.     Comments: + bilateral maxillary sinus tenderness     Mouth/Throat:     Mouth: Mucous membranes are dry.  Eyes:     Extraocular Movements: Extraocular movements  intact.     Pupils: Pupils are equal, round, and reactive to light.  Neck:     Musculoskeletal: Normal range of motion.     Comments: Swelling R side of neck consistent with known throat cancer  Cardiovascular:     Rate and Rhythm: Normal rate.  Pulmonary:     Effort: Pulmonary effort is normal.     Breath sounds: Normal breath sounds.  Abdominal:     General: Abdomen is flat.     Palpations: Abdomen is soft.  Musculoskeletal: Normal range of motion.  Skin:    General: Skin is warm.     Capillary Refill: Capillary refill takes less than 2 seconds.  Neurological:     General: No focal deficit present.     Mental Status: She is alert and oriented to person, place, and time.  Psychiatric:        Mood and Affect: Mood normal.      ED Treatments / Results  Labs (all labs ordered are listed, but only abnormal results are displayed) Labs Reviewed  CBC WITH DIFFERENTIAL/PLATELET - Abnormal; Notable for the following components:      Result Value   Hemoglobin 15.2 (*)    HCT 46.1 (*)    All other components within normal limits  BASIC METABOLIC PANEL    EKG None  Radiology No results found.  Procedures Procedures (including critical care time)  Medications Ordered in ED Medications  amoxicillin-clavulanate (AUGMENTIN) 875-125 MG per tablet 1 tablet (1 tablet Oral Given 11/27/18 2121)     Initial Impression / Assessment and Plan / ED Course  I have reviewed the triage vital signs and the nursing notes.  Pertinent labs & imaging results that were available during my care of the patient were reviewed by me and considered in my medical decision making (see chart for details).    Sheri Rivera is a 58 y.o. female here with sinus congestion, discharge. I think likely bacterial sinusitis. She had this previously. Will try augmentin. Since she has history of throat cancer, will get CBC.   10:01 PM CBC normal. Will dc home with course of augmentin.  Final Clinical  Impressions(s) / ED Diagnoses   Final diagnoses:  None    ED Discharge Orders    None       Drenda Freeze, MD 11/27/18 2201

## 2018-11-27 NOTE — ED Triage Notes (Signed)
Pt was seen by her oncologist 2 days ago (Dr. Worthy Rancher) and was dx sinus infection. Pt was told to go to the ED for further evaluation but pt called her PCP and was not able to get into the office until 1/8.  Pt has been taking mucinex, ibuprofen, and nasal spray at home.  Pt states the medications have not helped her symptoms and they have become worse.  Pt reports headache and stuffy nose.  Pt reports yellow/green mucus.  Pt reports last radiation was nov. 8.  Pt a/o x 4 and ambulatory in triage.

## 2018-12-15 ENCOUNTER — Telehealth: Payer: Self-pay | Admitting: *Deleted

## 2018-12-15 NOTE — Telephone Encounter (Signed)
Oncology Nurse Navigator Documentation  In follow-up to last week's TB discussion of [name] and ENT Dr. Pollie Rivera indication he will continue to follow her despite her Medicaid coverage, called his office, spoke with MA Sheri Rivera.  She indicated post-tmt follow-up appt not yet scheduled, she will call Sheri Rivera to arrange.  Sheri Orem, Sheri Rivera, Sheri Rivera Head & Neck Oncology Nurse [place] at [place] [phone]

## 2018-12-27 DIAGNOSIS — C321 Malignant neoplasm of supraglottis: Secondary | ICD-10-CM | POA: Diagnosis not present

## 2018-12-29 NOTE — Progress Notes (Signed)
error 

## 2019-01-02 ENCOUNTER — Ambulatory Visit
Admission: RE | Admit: 2019-01-02 | Discharge: 2019-01-02 | Disposition: A | Discharge status: Home / Self Care | Payer: Medicaid Other | Source: Ambulatory Visit | Attending: Radiation Oncology | Admitting: Radiation Oncology

## 2019-01-05 ENCOUNTER — Telehealth: Payer: Self-pay | Admitting: *Deleted

## 2019-01-05 NOTE — Telephone Encounter (Addendum)
Oncology Nurse Navigator Documentation  Returned VMM to [name]. She reported:  "Knot" in front of neck, sometimes hard, other times soft.  Recent dysphagia, "choking on food", able to drink water.  PET scheduled for 2/18 at [facility]  She requested appt to see Dr. Isidore Moos.  I later called her to inform her of 2/12 1:30 appt with Dr. Isidore Moos, explained [place] transportation will be arranged. Offered he opportunity to attend tomorrow morning's H&N MDC to meet with SLP.  She refused, asked for appt another time.  Gayleen Orem, RN, BSN Head & Neck Oncology Nurse [place] at [place] [phone]

## 2019-01-05 NOTE — Progress Notes (Signed)
Sheri Rivera presents for follow up of radiation completed 10/03/18 to her Larynx and bilateral neck.   Pain issues, if any: She reports pain to her throat that is constant.  Using a feeding tube?: No Weight changes, if any:  Wt Readings from Last 3 Encounters:  01/07/19 159 lb (72.1 kg)  11/27/18 162 lb (73.5 kg)  11/21/18 162 lb 9.6 oz (73.8 kg)   Swallowing issues, if any: She reports swallowing well until about 2 weeks ago. She notes that she now becomes choked while eating. She tells me that last week she was drinking orange juice, but this week it chokes and burns her throat.  Smoking or chewing tobacco? She is smoking on and off (days at a time). She is currently wearing a nicotine patch.  Using fluoride trays daily? N/A Last ENT visit was on: Dr. Lucia Gaskins Other notable issues, if any:  She reports a dry mouth which has increased recently. She is unable to swallow without water, but also becomes choked at times.  She has edema below her chin, which she is concerned about.  Her bilateral ears are uncomfortable, she feels like they are "leaking".  PET scan 01/13/19 ? F/U with Dr. Isidore Moos scheduled for 01/16/19.  BP 108/74 (BP Location: Right Arm)   Pulse 82   Temp 98.3 F (36.8 C) (Oral)   Resp 18   Wt 159 lb (72.1 kg)   SpO2 97% Comment: room air  BMI 28.17 kg/m

## 2019-01-07 ENCOUNTER — Other Ambulatory Visit: Payer: Self-pay

## 2019-01-07 ENCOUNTER — Encounter: Payer: Self-pay | Admitting: Radiation Oncology

## 2019-01-07 ENCOUNTER — Telehealth: Payer: Self-pay | Admitting: General Practice

## 2019-01-07 ENCOUNTER — Encounter: Payer: Self-pay | Admitting: *Deleted

## 2019-01-07 ENCOUNTER — Other Ambulatory Visit: Payer: Self-pay | Admitting: Radiation Oncology

## 2019-01-07 ENCOUNTER — Ambulatory Visit
Admission: RE | Admit: 2019-01-07 | Discharge: 2019-01-07 | Disposition: A | Discharge status: Home / Self Care | Payer: Medicaid Other | Source: Ambulatory Visit | Attending: Radiation Oncology | Admitting: Radiation Oncology

## 2019-01-07 ENCOUNTER — Telehealth: Payer: Self-pay | Admitting: *Deleted

## 2019-01-07 DIAGNOSIS — R0989 Other specified symptoms and signs involving the circulatory and respiratory systems: Secondary | ICD-10-CM | POA: Insufficient documentation

## 2019-01-07 DIAGNOSIS — F17210 Nicotine dependence, cigarettes, uncomplicated: Secondary | ICD-10-CM | POA: Diagnosis not present

## 2019-01-07 DIAGNOSIS — Z9119 Patient's noncompliance with other medical treatment and regimen: Secondary | ICD-10-CM | POA: Insufficient documentation

## 2019-01-07 DIAGNOSIS — C321 Malignant neoplasm of supraglottis: Secondary | ICD-10-CM | POA: Diagnosis present

## 2019-01-07 DIAGNOSIS — R1310 Dysphagia, unspecified: Secondary | ICD-10-CM | POA: Insufficient documentation

## 2019-01-07 DIAGNOSIS — Z923 Personal history of irradiation: Secondary | ICD-10-CM | POA: Diagnosis not present

## 2019-01-07 DIAGNOSIS — R682 Dry mouth, unspecified: Secondary | ICD-10-CM | POA: Insufficient documentation

## 2019-01-07 DIAGNOSIS — Z79899 Other long term (current) drug therapy: Secondary | ICD-10-CM | POA: Insufficient documentation

## 2019-01-07 NOTE — Telephone Encounter (Signed)
[facility] CSW Progress Note  Call from patient, asked if she could get another distribution from [facility] - says she is having difficulty affording food.  CSW checked records, patient has received maximum amount allowed and is not eligible for further funds from this source.  Offered to prepare a food bag from pantry and lists of local food distribution options.  Also messaged [facility] [employer] [employer] to alert them to this so they can assess for [place] if this has not already been done.  Edwyna Shell, LCSW Clinical Social Worker Phone:  [phone]

## 2019-01-07 NOTE — Telephone Encounter (Signed)
CALLED PATIENT TO ALTER FU VISIT FOR TODAY PER DR. SQUIRE, LVM FOR A RETURN CALL

## 2019-01-07 NOTE — Progress Notes (Addendum)
Radiation Oncology         [phone] ________________________________  Name: Sheri Rivera MRN: [id number]  Date: 01/07/2019  DOB: 05/31/1961  Follow-Up Visit Note  CC: Vonna Drafts, FNP  [name] E, *  Diagnosis and Prior Radiotherapy:       ICD-10-CM   1. Malignant neoplasm of supraglottis ([place]) C32.1 Ambulatory referral to Social Work    Ambulatory referral to Physical Therapy    Referral to Neuro Rehab   08/12/18 - 10/03/18: Larynx and bilateral neck / Total dose received: 60.3 Gy, varied fractionation, in 27 fractions.  CHIEF COMPLAINT:  Here for follow-up and surveillance of laryngeal cancer  Narrative:  The patient returns today for routine follow-up.  Generally she has not had restaging scans due to insurance issues.  She has also not followed up with otolaryngology per my recommendations and she cites insurance issues   Sadly her patient mentor died unexpectedly.  They had become best friends.  This has been difficult for her though she is coping relatively well  Swallowing issues, if any: She reports swallowing well until about 2 weeks ago. She notes that she now becomes choked while eating. She tells me that last week she was drinking orange juice, but this week it chokes and burns her throat.  Smoking or chewing tobacco? She is smoking on and off (days at a time). She is currently wearing a nicotine patch.  Using fluoride trays daily? N/A  Other notable issues, if any:  She reports a dry mouth which has increased recently. She is unable to swallow without water, but also becomes choked at times.  She has edema below her chin, which she is concerned about.  Her bilateral ears are uncomfortable, she feels like they are "leaking".   Weight changes, if any:  Wt Readings from Last 3 Encounters:  01/07/19 159 lb (72.1 kg)  11/27/18 162 lb (73.5 kg)  11/21/18 162 lb 9.6 oz (73.8 kg)    ALLERGIES:  is allergic to clindamycin/lincomycin; cymbalta  [duloxetine hcl]; levaquin [levofloxacin in d5w]; lyrica [pregabalin]; shellfish allergy; sulfamethoxazole; and codeine.  Meds: Current Outpatient Medications  Medication Sig Dispense Refill  . albuterol (PROVENTIL HFA;VENTOLIN HFA) 108 (90 BASE) MCG/ACT inhaler Inhale 2 puffs into the lungs every 6 (six) hours as needed for shortness of breath. Shortness of breath    . albuterol (PROVENTIL) (2.5 MG/3ML) 0.083% nebulizer solution Take 3 mLs (2.5 mg total) by nebulization every 4 (four) hours as needed for wheezing or shortness of breath. Shortness of breath 25 vial 0  . ibuprofen (ADVIL,MOTRIN) 600 MG tablet Take 600 mg by mouth 2 (two) times daily.    Marland Kitchen amoxicillin-clavulanate (AUGMENTIN) 875-125 MG tablet Take 1 tablet by mouth 2 (two) times daily. One po bid x 7 days (Patient not taking: Reported on 01/07/2019) 14 tablet 0  . fentaNYL (DURAGESIC) 12 MCG/HR Place 1 patch (12.5 mcg total) onto the skin every 3 (three) days. (Patient not taking: Reported on 10/31/2018) 10 patch 0  . fluconazole (DIFLUCAN) 100 MG tablet Take 2 tablets today, then 1 tablet daily x 13 more days. (Patient not taking: Reported on 11/27/2018) 15 tablet 0  . HYDROcodone-acetaminophen (HYCET) 7.5-325 mg/15 ml solution Take 10-15 mLs by mouth every 4 (four) hours as needed for moderate pain. (Patient not taking: Reported on 10/31/2018) 700 mL 0  . lidocaine (XYLOCAINE) 2 % solution Patient: Mix 1part 2% viscous lidocaine, 1part H20. Swish & swallow 75mL of diluted mixture, 87min before meals and  at bedtime, up to QID (Patient not taking: Reported on 10/31/2018) 100 mL 5  . loratadine (CLARITIN) 5 MG/5ML syrup Take 10 mLs (10 mg total) by mouth daily. (Patient not taking: Reported on 10/31/2018) 180 mL 12  . LORazepam (ATIVAN) 0.5 MG tablet Take 1 tab PRN extreme anxiety, not to exceed once daily. Use sparingly. Future Rx only at the discretion of the primary care provider. (Patient not taking: Reported on 11/27/2018) 20 tablet 0  .  Nutritional Supplements (ENSURE HIGH PROTEIN) LIQD Take 1 Can by mouth as needed. (Patient not taking: Reported on 10/31/2018) 36 Can 20  . Phenylephrine-APAP-guaiFENesin (MUCINEX SINUS-MAX CONGESTION) 10-650-400 MG/20ML LIQD Take 30 mLs by mouth every 12 (twelve) hours as needed (congestion).    . predniSONE (PREDNISONE INTENSOL) 5 MG/ML concentrated solution 4 ml x 2 days, 3 ml x 2 days, 2 ml x 2 days, 1 ml x 2 days, then stop, 0.5 ml x 2 days (Patient not taking: Reported on 10/31/2018) 30 mL 0  . scopolamine (TRANSDERM-SCOP) 1 MG/3DAYS Place 1 patch (1.5 mg total) onto the skin every 3 (three) days. (Patient not taking: Reported on 09/16/2018) 10 patch 0  . sertraline (ZOLOFT) 50 MG tablet Take 1 tablet (50 mg total) by mouth daily. (Patient not taking: Reported on 11/27/2018) 30 tablet 3  . sucralfate (CARAFATE) 1 g tablet Dissolve 1 tablet in 10 mL H20 and swallow 30 min prior to meals and bedtime. (Patient not taking: Reported on 10/31/2018) 40 tablet 4   No current facility-administered medications for this encounter.     Physical Findings: The patient is in no acute distress. Patient is alert and oriented. Wt Readings from Last 3 Encounters:  01/07/19 159 lb (72.1 kg)  11/27/18 162 lb (73.5 kg)  11/21/18 162 lb 9.6 oz (73.8 kg)    weight is 159 lb (72.1 kg). Her oral temperature is 98.3 F (36.8 C). Her blood pressure is 108/74 and her pulse is 82. Her respiration is 18 and oxygen saturation is 97%. .  General: Alert and oriented, in no acute distress HEENT: Head is normocephalic. Extraocular movements are intact. Upper oropharynx and oral cavity are clear. b/l tympanic membranes appear clear. Neck: Edema in anterior neck.  No adenopathy appreciated  lymphatics: see Neck Exam Neurologic: Cranial nerves II through XII are grossly intact. No obvious focalities. Speech is fluent. Coordination is intact. Psychiatric: Judgment and insight are intact. Affect is appropriate.   Lab  Findings: Lab Results  Component Value Date   WBC 6.3 11/27/2018   HGB 15.2 (H) 11/27/2018   HCT 46.1 (H) 11/27/2018   MCV 96.6 11/27/2018   PLT 253 11/27/2018    Lab Results  Component Value Date   TSH 0.511 08/05/2018    Radiographic Findings: No results found.  Impression/Plan:    1) Head and Neck Cancer Status: Pending restaging scans, need these to assess  2) Nutritional Status: Weight is stable, she has been noncompliant with nutritionist referrals  3) Risk Factors: The patient has been educated about risk factors including alcohol and tobacco abuse; they understand that avoidance of alcohol and tobacco is important to prevent recurrences as well as other cancers Smoking again, AMA.  She understands the risks of this.  4) Swallowing:dysphagia.  She has been noncompliant with speech-language pathology referrals  5) Dental: Encouraged to continue regular followup with dentistry, and dental hygiene including fluoride rinses.   6) Thyroid function: WNL Lab Results  Component Value Date   TSH 0.511 08/05/2018  7) Other: I'll ask Gayleen Orem, RN, our Head and Neck Oncology Navigator set up follow-up in multidisciplinary clinic to see speech-language pathology for dysphagia and physical therapy for lymphedema   We will ask social work to help her with transportation issues  8) Follow-up in about a week after restaging scans are accomplished, our team is working on getting authorization  Patient expresses willingness to comply with the above referrals  In a visit lasting over 15 minutes, all face-to-face, over 50% the time was spent on coordination of care and counseling.  ________________________________   Eppie Gibson, MD  This document serves as a record of services personally performed by Eppie Gibson, MD. It was created on her behalf by Wilburn Mylar, a trained medical scribe. The creation of this record is based on the scribe's personal observations and the  provider's statements to them. This document has been checked and approved by the attending provider.

## 2019-01-08 ENCOUNTER — Other Ambulatory Visit: Payer: Self-pay | Admitting: Medical

## 2019-01-08 ENCOUNTER — Inpatient Hospital Stay: Payer: Medicaid Other | Admitting: Medical

## 2019-01-08 ENCOUNTER — Inpatient Hospital Stay: Payer: Medicaid Other | Attending: Radiation Oncology | Admitting: Medical

## 2019-01-08 ENCOUNTER — Telehealth: Payer: Self-pay | Admitting: *Deleted

## 2019-01-08 VITALS — BP 127/115 | HR 104 | Temp 98.0°F | Resp 16 | Ht 63.0 in | Wt 157.1 lb

## 2019-01-08 DIAGNOSIS — J020 Streptococcal pharyngitis: Secondary | ICD-10-CM | POA: Diagnosis not present

## 2019-01-08 DIAGNOSIS — C321 Malignant neoplasm of supraglottis: Secondary | ICD-10-CM

## 2019-01-08 DIAGNOSIS — H66002 Acute suppurative otitis media without spontaneous rupture of ear drum, left ear: Secondary | ICD-10-CM

## 2019-01-08 DIAGNOSIS — R509 Fever, unspecified: Secondary | ICD-10-CM

## 2019-01-08 LAB — URINALYSIS, COMPLETE (UACMP) WITH MICROSCOPIC
Bacteria, UA: NONE SEEN
Bilirubin Urine: NEGATIVE
Glucose, UA: NEGATIVE mg/dL
Hgb urine dipstick: NEGATIVE
Ketones, ur: NEGATIVE mg/dL
Leukocytes,Ua: NEGATIVE
Nitrite: NEGATIVE
Protein, ur: NEGATIVE mg/dL
Specific Gravity, Urine: 1.004 — ABNORMAL LOW (ref 1.005–1.030)
pH: 6 (ref 5.0–8.0)

## 2019-01-08 LAB — CBC WITH DIFFERENTIAL (CANCER CENTER ONLY)
Abs Immature Granulocytes: 0.02 10*3/uL (ref 0.00–0.07)
Basophils Absolute: 0 10*3/uL (ref 0.0–0.1)
Basophils Relative: 1 %
Eosinophils Absolute: 0.1 10*3/uL (ref 0.0–0.5)
Eosinophils Relative: 1 %
HCT: 45.9 % (ref 36.0–46.0)
Hemoglobin: 15.2 g/dL — ABNORMAL HIGH (ref 12.0–15.0)
Immature Granulocytes: 0 %
Lymphocytes Relative: 14 %
Lymphs Abs: 1 10*3/uL (ref 0.7–4.0)
MCH: 31.9 pg (ref 26.0–34.0)
MCHC: 33.1 g/dL (ref 30.0–36.0)
MCV: 96.4 fL (ref 80.0–100.0)
Monocytes Absolute: 0.5 10*3/uL (ref 0.1–1.0)
Monocytes Relative: 8 %
Neutro Abs: 5.3 10*3/uL (ref 1.7–7.7)
Neutrophils Relative %: 76 %
Platelet Count: 270 10*3/uL (ref 150–400)
RBC: 4.76 MIL/uL (ref 3.87–5.11)
RDW: 14.6 % (ref 11.5–15.5)
WBC Count: 6.9 10*3/uL (ref 4.0–10.5)
nRBC: 0 % (ref 0.0–0.2)

## 2019-01-08 LAB — CMP (CANCER CENTER ONLY)
ALT: 9 U/L (ref 0–44)
AST: 13 U/L — ABNORMAL LOW (ref 15–41)
Albumin: 3.8 g/dL (ref 3.5–5.0)
Alkaline Phosphatase: 105 U/L (ref 38–126)
Anion gap: 9 (ref 5–15)
BUN: 4 mg/dL — ABNORMAL LOW (ref 6–20)
CO2: 23 mmol/L (ref 22–32)
Calcium: 9.9 mg/dL (ref 8.9–10.3)
Chloride: 107 mmol/L (ref 98–111)
Creatinine: 0.64 mg/dL (ref 0.44–1.00)
GFR, Est AFR Am: >60 mL/min (ref >60)
GFR, Estimated: >60 mL/min (ref >60)
Glucose, Bld: 96 mg/dL (ref 70–99)
Potassium: 3.8 mmol/L (ref 3.5–5.1)
Sodium: 139 mmol/L (ref 135–145)
Total Bilirubin: 0.6 mg/dL (ref 0.3–1.2)
Total Protein: 6.9 g/dL (ref 6.5–8.1)

## 2019-01-08 LAB — SAMPLE TO BLOOD BANK

## 2019-01-08 MED ORDER — FLUCONAZOLE 100 MG PO TABS: 100.0000 mg | ORAL_TABLET | Freq: Every day | ORAL | 0 refills | Status: DC

## 2019-01-08 MED ORDER — MAGIC MOUTHWASH W/LIDOCAINE: 10.0000 mL | Freq: Four times a day (QID) | ORAL | 0 refills | Status: DC | PRN

## 2019-01-08 MED ORDER — HYDROCODONE-HOMATROPINE 5-1.5 MG/5ML PO SYRP: 5.0000 mL | ORAL_SOLUTION | Freq: Four times a day (QID) | ORAL | 0 refills | Status: DC | PRN

## 2019-01-08 MED ORDER — AMOXICILLIN-POT CLAVULANATE 875-125 MG PO TABS: 1.0000 | ORAL_TABLET | Freq: Two times a day (BID) | ORAL | 0 refills | Status: DC

## 2019-01-08 NOTE — Progress Notes (Signed)
Pt presents with c/o sore throat, dry cough, sinus pressure, ear pressure, drainage, chills, fatigue, and recent fever.  Pt's granddaughter recently had sore throat and stayed with pt a few days ago while sick.  Pt afebrile.  Denies CP or SOB or N/V/D.  Speaks in full sentences.

## 2019-01-08 NOTE — Patient Instructions (Signed)
Sore Throat  When you have a sore throat, your throat may feel:  · Tender.  · Burning.  · Irritated.  · Scratchy.  · Painful when you swallow.  · Painful when you talk.  Many things can cause a sore throat, such as:  · An infection.  · Allergies.  · Dry air.  · Smoke or pollution.  · Radiation treatment.  · Gastroesophageal reflux disease (GERD).  · A tumor.  A sore throat can be the first sign of another sickness. It can happen with other problems, like:  · Coughing.  · Sneezing.  · Fever.  · Swelling in the neck.  Most sore throats go away without treatment.  Follow these instructions at home:         · Take over-the-counter medicines only as told by your doctor.  ? If your child has a sore throat, do not give your child aspirin.  · Drink enough fluids to keep your pee (urine) pale yellow.  · Rest when you feel you need to.  · To help with pain:  ? Sip warm liquids, such as broth, herbal tea, or warm water.  ? Eat or drink cold or frozen liquids, such as frozen ice pops.  ? Gargle with a salt-water mixture 3-4 times a day or as needed. To make a salt-water mixture, add ½-1 tsp (3-6 g) of salt to 1 cup (237 mL) of warm water. Mix it until you cannot see the salt anymore.  ? Suck on hard candy or throat lozenges.  ? Put a cool-mist humidifier in your bedroom at night.  ? Sit in the bathroom with the door closed for 5-10 minutes while you run hot water in the shower.  · Do not use any products that contain nicotine or tobacco, such as cigarettes, e-cigarettes, and chewing tobacco. If you need help quitting, ask your doctor.  · Wash your hands well and often with soap and water. If soap and water are not available, use hand sanitizer.  Contact a doctor if:  · You have a fever for more than 2-3 days.  · You keep having symptoms for more than 2-3 days.  · Your throat does not get better in 7 days.  · You have a fever and your symptoms suddenly get worse.  · Your child who is 3 months to 3 years old has a temperature of  102.2°F (39°C) or higher.  Get help right away if:  · You have trouble breathing.  · You cannot swallow fluids, soft foods, or your saliva.  · You have swelling in your throat or neck that gets worse.  · You keep feeling sick to your stomach (nauseous).  · You keep throwing up (vomiting).  Summary  · A sore throat is pain, burning, irritation, or scratchiness in the throat. Many things can cause a sore throat.  · Take over-the-counter medicines only as told by your doctor. Do not give your child aspirin.  · Drink plenty of fluids, and rest as needed.  · Contact a doctor if your symptoms get worse or your sore throat does not get better within 7 days.  This information is not intended to replace advice given to you by your health care provider. Make sure you discuss any questions you have with your health care provider.  Document Released: 08/21/2008 Document Revised: 04/14/2018 Document Reviewed: 04/14/2018  Elsevier Interactive Patient Education © 2019 Elsevier Inc.

## 2019-01-08 NOTE — Progress Notes (Signed)
These preliminary result these preliminary results were noted.  Awaiting final report.

## 2019-01-08 NOTE — Telephone Encounter (Addendum)
Oncology Nurse Navigator Documentation  Returned morning VMM from pt. She tearfully reported:  "Tonsil and throat are burning", swollen throat putting painful pressure on her ears.  Temp 101 F last HS, 99 F this morning after several Ibuprofen.  "Having trouble breathing".  I reminded her of discussion yesterday with Dr. Isidore Moos indicating neck swelling d/t lymphedema which can contribute to neck discomfort, choking sensation. She acknowledged her current high level of anxiety probably contributing to shortness of breath. She requested to be seen at [facility] and if not available, she was going to go to ED.  I spoke with [facility] PA Sandi Mealy who agreed to see Ms. Monia Pouch, requested she arrive by 1:30 for labs after which he will see her at 2:00.  I coordinated transportation with [facility] Otto for 1:15 pick-up. Called Ms. Sampley with this information.  She expressed appreciation.  Gayleen Orem, RN, BSN Head & Neck Oncology Nurse [place] at [place] [phone]

## 2019-01-09 LAB — URINE CULTURE: Culture: NO GROWTH

## 2019-01-09 NOTE — Progress Notes (Signed)
These preliminary result these preliminary results were noted.  Awaiting final report.

## 2019-01-09 NOTE — Progress Notes (Signed)
Symptoms Management Clinic Progress Note   Sheri Rivera [id number] [birth year] 57 y.o.  Sheri Rivera is managed by Dr. Eppie Gibson  Actively treated with chemotherapy/immunotherapy/hormonal therapy: The patient was previously treated with radiation therapy which she completed on 10/03/2018.   Assessment: Plan:    Non-recurrent acute suppurative otitis media of left ear without spontaneous rupture of tympanic membrane - Plan: amoxicillin-clavulanate (AUGMENTIN) 875-125 MG tablet  Strep pharyngitis - Plan: amoxicillin-clavulanate (AUGMENTIN) 875-125 MG tablet, fluconazole (DIFLUCAN) 100 MG tablet, HYDROcodone-homatropine (HYCODAN) 5-1.5 MG/5ML syrup, magic mouthwash w/lidocaine SOLN  Malignant neoplasm of supraglottis (HCC)   Left otitis media and suspected strep pharyngitis: The patient was given Augmentin 875-125 p.o. twice daily x7 days.  She was additionally given a prescription for Hycodan cough syrup and Magic mouthwash.  She was given Diflucan prophylactically.  Malignant neoplasm of the supraglottis: The patient is status post radiation therapy which she completed on 10/03/2018.  Please see After Visit Summary for patient specific instructions.  Future Appointments  Date Time Provider [place]  01/16/2019  3:20 PM Eppie Gibson, MD [facility] None    No orders of the defined types were placed in this encounter.      Subjective:   Patient ID:  Sheri Rivera is a 58 y.o. (DOB 10-03-1961) female.  Chief Complaint:  Chief Complaint  Patient presents with  . Cough    HPI Sheri Rivera is a 58 year old female with a history of a malignancy of the supraglottis.  She is followed by Dr. Isidore Moos and was last treated with radiation therapy on 10/03/2018.  She reports that she has been eating and drinking without any problems until the past 24 to 48 hours.  She states that her granddaughter stayed with her this past weekend and slept in the bed with  her.  Her granddaughter has tested positive for strep throat.  The patient had a fever of 101.3 last evening and took Tylenol.  Her most recent temperature by her report was 99.  She was concerned that she was having swelling in her throat and was concerned that her throat would close.  She reports ear pressure bilaterally, sinus pressure, postnasal drainage, a nonproductive cough, photosensitivity, and a sore throat.  She denies nausea, vomiting, or diarrhea.  She is scheduled for a PET scan on 01/13/2019.  Medications: I have reviewed the patient's current medications.  Allergies:  Allergies  Allergen Reactions  . Clindamycin/Lincomycin Anaphylaxis and Swelling  . Cymbalta [Duloxetine Hcl] Anaphylaxis and Itching    Lips and throat swelled; stopped breathing  . Levaquin [Levofloxacin In D5w] Anaphylaxis and Itching  . Lyrica [Pregabalin] Shortness Of Breath and Swelling    Lips and throat swelled  . 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 Sulfamethoxazole Anaphylaxis  . Codeine Anxiety    Sweating,nervous    Past Medical History:  Diagnosis Date  . Anxiety 11/11/11  . Arthritis   . Asthma   . Bursitis    right shoulder  . Chronic back pain   . Chronic neck pain   . Chronic shoulder pain   . COPD (chronic obstructive pulmonary disease) ([place])    continues to smoke 1ppd  . DDD (degenerative disc disease)   . Depression   . Fibromyalgia   . GERD (gastroesophageal reflux disease)   . Heart murmur    as a child per pt  . Herpes   . History of radiation therapy 08/12/18- 10/03/18   Larynx, prescribed dose of  63 Gy in 28 fractions. She chose to stop treatments after 27 fractions.   . IBS (irritable bowel syndrome)   . Lumbar radiculopathy   . Seasonal allergies   . Smoker   . Thyroid nodule    goiters    Past Surgical History:  Procedure Laterality Date  . CHOLECYSTECTOMY    . DIRECT LARYNGOSCOPY N/A 07/11/2018   Procedure: DIRECT LARYNGOSCOPY WITH BIOPSY;   Surgeon: Rozetta Nunnery, MD;  Location: [place];  Service: ENT;  Laterality: N/A;  . HERNIA REPAIR     [place]   . HIATAL HERNIA REPAIR    . TUBAL LIGATION  1983    Family History  Problem Relation Age of Onset  . Lung cancer Mother   . Arthritis Mother        Rheumatoid  . Bursitis Mother   . Asthma Mother   . Heart attack Father 64       Died of MI  . Obesity Father   . Fibromyalgia Sister   . Asthma Child     Social History   Socioeconomic History  . Marital status: Divorced    Spouse name: Not on file  . Number of children: 3  . Years of education: Not on file  . Highest education level: Not on file  Occupational History    Employer: [employer]  Social Needs  . Financial resource strain: Not on file  . Food insecurity:    Worry: Not on file    Inability: Not on file  . Transportation needs:    Medical: Yes    Non-medical: No  Tobacco Use  . Smoking status: Current Some Day Smoker    Packs/day: 1.50    Years: 27.00    Pack years: 40.50    Types: Cigarettes  . Smokeless tobacco: Never Used  . Tobacco comment: she smoked 3 cigaretteson 12/25 and 12/26  Substance and Sexual Activity  . Alcohol use: No  . Drug use: No  . Sexual activity: Not on file  Lifestyle  . Physical activity:    Days per week: Not on file    Minutes per session: Not on file  . Stress: Not on file  Relationships  . Social connections:    Talks on phone: Not on file    Gets together: Not on file    Attends religious service: Not on file    Active member of club or organization: Not on file    Attends meetings of clubs or organizations: Not on file    Relationship status: Not on file  . Intimate partner violence:    Fear of current or ex partner: No    Emotionally abused: No    Physically abused: No    Forced sexual activity: No  Other Topics Concern  . Not on file  Social History Narrative   She lives by herself    Past Medical History,  Surgical history, Social history, and Family history were reviewed and updated as appropriate.   Please see review of systems for further details on the patient's review from today.   Review of Systems:  Review of Systems  Constitutional: Positive for fever. Negative for chills and diaphoresis.  HENT: Positive for postnasal drip, sinus pressure, sore throat and trouble swallowing. Negative for congestion and sinus pain.   Respiratory: Negative for cough, shortness of breath and wheezing.   Cardiovascular: Negative for chest pain and palpitations.  Gastrointestinal: Negative for constipation, diarrhea, nausea and vomiting.  Genitourinary: Negative for difficulty urinating.  Neurological: Negative for headaches.    Objective:   Physical Exam:  BP (!) 127/115 (BP Location: Left Arm, Patient Position: Sitting) Comment: Notified Nurse of BP  Pulse (!) 104   Temp 98 F (36.7 C) (Oral)   Resp 16   Ht 5\' 3"  (1.6 m)   Wt 157 lb 1.6 oz (71.3 kg)   SpO2 97%   BMI 27.83 kg/m  ECOG: 0  Physical Exam Constitutional:      General: She is not in acute distress.    Appearance: She is not diaphoretic.  HENT:     Head: Normocephalic and atraumatic.     Right Ear: External ear normal. No middle ear effusion. Tympanic membrane is not injected, perforated, erythematous, retracted or bulging.     Left Ear: External ear normal. A middle ear effusion is present. Tympanic membrane is not injected, perforated, erythematous, retracted or bulging.     Nose:     Right Sinus: Maxillary sinus tenderness and frontal sinus tenderness present.     Left Sinus: Maxillary sinus tenderness and frontal sinus tenderness present.     Mouth/Throat:     Pharynx: Posterior oropharyngeal erythema present. No oropharyngeal exudate.     Comments: Tonsils are swollen. Neck:     Musculoskeletal: Normal range of motion and neck supple.  Cardiovascular:     Rate and Rhythm: Regular rhythm. Tachycardia present.     Heart  sounds: Normal heart sounds. No murmur. No friction rub. No gallop.   Pulmonary:     Effort: Pulmonary effort is normal. No respiratory distress.     Breath sounds: Normal breath sounds. No wheezing or rales.  Lymphadenopathy:     Cervical: No cervical adenopathy.  Skin:    General: Skin is warm and dry.     Findings: No erythema or rash.  Neurological:     Mental Status: She is alert.     Coordination: Coordination normal.  Psychiatric:        Behavior: Behavior normal.        Thought Content: Thought content normal.        Judgment: Judgment normal.     Lab Review:     Component Value Date/Time   NA 139 01/08/2019 1344   K 3.8 01/08/2019 1344   CL 107 01/08/2019 1344   CO2 23 01/08/2019 1344   GLUCOSE 96 01/08/2019 1344   BUN 4 (L) 01/08/2019 1344   CREATININE 0.64 01/08/2019 1344   CALCIUM 9.9 01/08/2019 1344   PROT 6.9 01/08/2019 1344   ALBUMIN 3.8 01/08/2019 1344   AST 13 (L) 01/08/2019 1344   ALT 9 01/08/2019 1344   ALKPHOS 105 01/08/2019 1344   BILITOT 0.6 01/08/2019 1344   GFRNONAA >60 01/08/2019 1344   GFRAA >60 01/08/2019 1344       Component Value Date/Time   WBC 6.9 01/08/2019 1344   WBC 6.3 11/27/2018 2118   RBC 4.76 01/08/2019 1344   HGB 15.2 (H) 01/08/2019 1344   HCT 45.9 01/08/2019 1344   PLT 270 01/08/2019 1344   MCV 96.4 01/08/2019 1344   MCH 31.9 01/08/2019 1344   MCHC 33.1 01/08/2019 1344   RDW 14.6 01/08/2019 1344   LYMPHSABS 1.0 01/08/2019 1344   MONOABS 0.5 01/08/2019 1344   EOSABS 0.1 01/08/2019 1344   BASOSABS 0.0 01/08/2019 1344   -------------------------------  Imaging from last 24 hours (if applicable):  Radiology interpretation: No results found.

## 2019-01-10 NOTE — Progress Notes (Signed)
Oncology Nurse Navigator Documentation  Met with Ms. Monia Pouch during Follow-up appointment with Dr. Isidore Moos. She arrived with pronounced lymphedema in front neck. She reported:  Complaint of "choking" with swallowing.  Return of thickened saliva.  Continuation of smoking d/t stress, recent re-initiation of nicotine patch.  Stated she takes it off when she smokes a cigarette.  Eating nuts, salads, steak.  Reiterated cannot see PCP until Medicaid card reissued 3/1 with correct PCP name.  PCP will then refer her to ENT Lucia Gaskins for post-tmt follow-up.  Expressed concern PET has yet to be conducted d/t lack of approval by Medicaid.  Dr. Isidore Moos to order CT Neck during interim. She voiced understanding she will RTC upon completion of imaging. She voiced understanding I will coordinate f/u with PT and SLP at next H&N MDC. I encouraged her to call me with needs/concerns.  Interventions Re-educated her on proper use of nicotine patches. Return to H&N [place] for follow-up with PT for lymphedema, SLP for assessment of dysphagia/HEP re-education.   Gayleen Orem, RN, BSN Head & Neck Oncology Nurse [place] at [place] [phone]

## 2019-01-12 ENCOUNTER — Telehealth: Payer: Self-pay | Admitting: *Deleted

## 2019-01-12 NOTE — Progress Notes (Signed)
These preliminary result these preliminary results were noted.  Awaiting final report.

## 2019-01-12 NOTE — Progress Notes (Signed)
error 

## 2019-01-12 NOTE — Telephone Encounter (Signed)
CSW received referral from radiation oncologist.  CSW attempted to reach patient by phone, left voicemail to return call.  Maryjean Morn, MSW, LCSW, OSW-C Clinical Social Worker [facility] [phone]

## 2019-01-13 ENCOUNTER — Telehealth: Payer: Self-pay | Admitting: *Deleted

## 2019-01-13 ENCOUNTER — Encounter (HOSPITAL_COMMUNITY): Payer: Medicaid Other

## 2019-01-13 LAB — CULTURE, BLOOD (SINGLE)
Culture: NO GROWTH
Culture: NO GROWTH
Special Requests: ADEQUATE

## 2019-01-13 NOTE — Telephone Encounter (Signed)
Called patient to inform of CT for 01-15-19 - arrival time- 2:15 pm @ [facility], pt. to have water only - 4 hrs. Prior to test, lvm for a return call

## 2019-01-13 NOTE — Progress Notes (Signed)
These preliminary result these preliminary results were noted.  Awaiting final report.

## 2019-01-14 ENCOUNTER — Other Ambulatory Visit: Payer: Self-pay | Admitting: Radiation Oncology

## 2019-01-14 ENCOUNTER — Telehealth: Payer: Self-pay | Admitting: *Deleted

## 2019-01-14 DIAGNOSIS — C321 Malignant neoplasm of supraglottis: Secondary | ICD-10-CM

## 2019-01-15 ENCOUNTER — Ambulatory Visit (HOSPITAL_COMMUNITY): Admission: RE | Admit: 2019-01-15 | Payer: Medicaid Other | Source: Ambulatory Visit

## 2019-01-15 NOTE — Telephone Encounter (Signed)
Oncology Nurse Navigator Documentation  Called Sheri Rivera to check on her well-being as she did not arrive for this afternoon's CT scan, no answer, LVMM asking for return call.  Gayleen Orem, RN, BSN Head & Neck Oncology Nurse [place] at [place] [phone]

## 2019-01-16 ENCOUNTER — Ambulatory Visit
Admission: RE | Admit: 2019-01-16 | Discharge: 2019-01-16 | Disposition: A | Discharge status: Home / Self Care | Payer: Medicaid Other | Source: Ambulatory Visit | Attending: Radiation Oncology | Admitting: Radiation Oncology

## 2019-01-21 ENCOUNTER — Telehealth: Payer: Self-pay | Admitting: *Deleted

## 2019-01-21 NOTE — Telephone Encounter (Signed)
CSW attempted to contact patient, left voicemail to return call.  CSW attempted to contact patient's daughter and unable to leave a message.  Maryjean Morn, MSW, LCSW, OSW-C Clinical Social Worker [facility] [phone]

## 2019-01-22 ENCOUNTER — Telehealth: Payer: Self-pay | Admitting: *Deleted
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
## 2019-01-22 NOTE — Telephone Encounter (Signed)
Oncology Nurse Navigator Documentation  Called Ms. Berkley to check on her well-being and to schedule PET.  LVMM asking her to return call.  Called dtr, no answer, unable to leave message.  Gayleen Orem, RN, BSN Head & Neck Oncology Nurse [place] at [place] [phone]

## 2019-01-23 ENCOUNTER — Telehealth: Payer: Self-pay | Admitting: *Deleted

## 2019-01-25 DIAGNOSIS — C321 Malignant neoplasm of supraglottis: Secondary | ICD-10-CM | POA: Diagnosis not present
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
## 2019-01-27 ENCOUNTER — Telehealth: Payer: Self-pay | Admitting: Radiation Oncology

## 2019-01-27 ENCOUNTER — Encounter: Payer: Self-pay | Admitting: *Deleted

## 2019-01-27 NOTE — Telephone Encounter (Signed)
Just spoke with patient to let her know that her rides have been scheduled for next week.
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
## 2019-01-28 NOTE — Progress Notes (Signed)
error 

## 2019-01-29 ENCOUNTER — Telehealth: Payer: Self-pay | Admitting: *Deleted

## 2019-01-29 NOTE — Telephone Encounter (Signed)
Oncology Nurse Navigator Documentation  LVMM for Sheri Rivera asking her to call me re next week's Monday PET and Tuesday morning's MDC appts.  Gayleen Orem, RN, BSN Head & Neck Oncology Nurse [place] at [place] [phone]

## 2019-01-30 ENCOUNTER — Telehealth: Payer: Self-pay | Admitting: *Deleted

## 2019-01-30 NOTE — Telephone Encounter (Addendum)
Oncology Nurse Navigator Documentation  Sheri Rivera returned my call from earlier this morning, I reviewed Monday's 11:30 arrival for [facility] PET (including no carb/NPO guidelines) and Tuesday morning's 9:30 arrival for H&N MDC, 12:00 appt with Dr. Isidore Moos to review PET results, confirmed LYFT pick-up times.  She voiced understanding   She indicated she recently visited PCP Horald Pollen, [facility] [facility], will RTC when she has her new Medicaid card with Dr. Darron Doom designated as PCP.  Gayleen Orem, RN, BSN Head & Neck Oncology Nurse [place] at [place] [phone]

## 2019-02-02 ENCOUNTER — Encounter (HOSPITAL_COMMUNITY): Payer: Medicaid Other

## 2019-02-02 ENCOUNTER — Telehealth: Payer: Self-pay | Admitting: *Deleted

## 2019-02-02 NOTE — Telephone Encounter (Signed)
CALLED PATIENT TO ASK ABOUT MOVING FU TO 02-06-19, LVM FOR A RETURN CALL

## 2019-02-03 ENCOUNTER — Ambulatory Visit: Payer: Medicaid Other

## 2019-02-03 ENCOUNTER — Ambulatory Visit
Admission: RE | Admit: 2019-02-03 | Discharge: 2019-02-03 | Disposition: A | Discharge status: Home / Self Care | Payer: Medicaid Other | Source: Ambulatory Visit | Attending: Radiation Oncology | Admitting: Radiation Oncology

## 2019-02-03 ENCOUNTER — Encounter: Payer: Self-pay | Admitting: *Deleted

## 2019-02-03 ENCOUNTER — Ambulatory Visit: Payer: Medicaid Other | Admitting: Physical Therapy

## 2019-02-03 ENCOUNTER — Inpatient Hospital Stay: Payer: Medicaid Other | Admitting: Nutrition

## 2019-02-03 ENCOUNTER — Encounter: Payer: Self-pay | Admitting: Nutrition

## 2019-02-03 NOTE — Progress Notes (Signed)
Patient was scheduled to be seen during head and neck clinic for nutrition follow-up.  Patient canceled clinic appointment.

## 2019-02-04 ENCOUNTER — Telehealth: Payer: Self-pay | Admitting: *Deleted

## 2019-02-04 NOTE — Telephone Encounter (Signed)
CALLED PATIENT TO INFORM THAT DR. SQUIRE WOULD BE GLAD TO SEE HER ON 02-06-19 @ 3 PM, PATIENT VERIFIED UNDERSTANDING THIS

## 2019-02-05 ENCOUNTER — Other Ambulatory Visit: Payer: Self-pay

## 2019-02-05 ENCOUNTER — Encounter (HOSPITAL_COMMUNITY)
Admission: RE | Admit: 2019-02-05 | Discharge: 2019-02-05 | Disposition: A | Discharge status: Home / Self Care | Payer: Medicaid Other | Source: Ambulatory Visit | Attending: Radiation Oncology | Admitting: Radiation Oncology

## 2019-02-05 DIAGNOSIS — C321 Malignant neoplasm of supraglottis: Secondary | ICD-10-CM

## 2019-02-05 LAB — GLUCOSE, CAPILLARY: Glucose-Capillary: 91 mg/dL (ref 70–99)

## 2019-02-05 MED ORDER — FLUDEOXYGLUCOSE F - 18 (FDG) INJECTION
8.4300 | Freq: Once | INTRAVENOUS | Status: AC
  Administered 2019-02-05: 8.43 via INTRAVENOUS

## 2019-02-05 NOTE — Progress Notes (Signed)
Radiation Oncology         [phone] ________________________________  Name: Sheri Rivera MRN: [id number]  Date: 02/06/2019  DOB: 1961/05/01  Follow-Up Visit Note  CC: Vonna Drafts, FNP  [name] E, *  Diagnosis and Prior Radiotherapy:       ICD-10-CM   1. 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  CHIEF COMPLAINT:  Here for follow-up and surveillance of laryngeal cancer  Narrative:  The patient returns today for routine follow-up.   Restaging PET on 02/05/2019 was reviewed by me personally.  It shows some nonspecific activity in the right tonsil.  Otherwise the hypermetabolic activity that was previously in her head and neck region has resolved significantly with no obvious evidence of disease.  There is mild residual activity in the larynx.  The lymph nodes have resolved completely.  The patient reports that she was recently treated for strep throat and had a lot of inflammation in her throat upon exam.  She had a delay in her restaging scans due to insurance issues.  She also pushed back some of her appointments electively.  She has also not followed up with otolaryngology per my recommendations and she cites insurance issues   Pt states moderate fatigue. Pt states that she has trouble sleeping at night. Pt states that she has painful and difficulty swallowing. Pt states that some day are better than others. Pt states that she has dry mouth and thick saliva. Pt denies having any mouth sores.  She does continue to smoke.  Reports that sometimes she spontaneously loses her voice but today her voice is good.   Weight changes, if any:  Wt Readings from Last 3 Encounters:  02/06/19 154 lb 3.2 oz (69.9 kg)  01/08/19 157 lb 1.6 oz (71.3 kg)  01/07/19 159 lb (72.1 kg)    ALLERGIES:   is allergic to clindamycin/lincomycin; cymbalta [duloxetine hcl]; levaquin [levofloxacin in d5w]; lyrica [pregabalin]; shellfish allergy; sulfamethoxazole; and codeine.  Meds: Current Outpatient Medications  Medication Sig Dispense Refill   albuterol (PROVENTIL HFA;VENTOLIN HFA) 108 (90 BASE) MCG/ACT inhaler Inhale 2 puffs into the lungs every 6 (six) hours as needed for shortness of breath. Shortness of breath     albuterol (PROVENTIL) (2.5 MG/3ML) 0.083% nebulizer solution Take 3 mLs (2.5 mg total) by nebulization every 4 (four) hours as needed for wheezing or shortness of breath. Shortness of breath 25 vial 0   amoxicillin-clavulanate (AUGMENTIN) 875-125 MG tablet Take 1 tablet by mouth 2 (two) times daily. 14 tablet 0   fluconazole (DIFLUCAN) 100 MG tablet Take 1 tablet (100 mg total) by mouth daily. 7 tablet 0   HYDROcodone-homatropine (HYCODAN) 5-1.5 MG/5ML syrup Take 5 mLs by mouth every 6 (six) hours as needed for cough. 120 mL 0   ibuprofen (ADVIL,MOTRIN) 600 MG tablet Take 600 mg by mouth 2 (two) times daily.     LORazepam (ATIVAN) 0.5 MG tablet Take 1 tab PRN extreme anxiety, not to exceed once daily. Use sparingly. Future Rx only at the discretion of the primary care provider. 20 tablet 0   magic mouthwash w/lidocaine SOLN Take 10 mLs by mouth 4 (four) times daily as needed for mouth pain. 240 mL 0   nicotine (NICODERM CQ - DOSED IN MG/24 HOURS) 21 mg/24hr patch      No current facility-administered medications  for this encounter.     Physical Findings: The patient is in no acute distress. Patient is alert and oriented. Wt Readings from Last 3 Encounters:  02/06/19 154 lb 3.2 oz (69.9 kg)  01/08/19 157 lb 1.6 oz (71.3 kg)  01/07/19 159 lb (72.1 kg)    height is 5\' 3"  (1.6 m) and weight is 154 lb 3.2 oz (69.9 kg). Her temperature is 98.2 F (36.8 C). Her blood pressure is 116/85 and her pulse is 90. Her respiration is 20 and oxygen saturation is 97%. .   General: Alert  and oriented, in no acute distress HEENT: Head is normocephalic. Extraocular movements are intact. Upper oropharynx and oral cavity are clear.  No obvious lesions in the tonsils  neck: Mild to moderate edema in anterior neck.  No adenopathy appreciated  lymphatics: see Neck Exam Neurologic: Cranial nerves II through XII are grossly intact. No obvious focalities. Speech is fluent. Coordination is intact. Psychiatric: Judgment and insight are intact. Affect is appropriate.  PROCEDURE NOTE: After obtaining consent and anesthetizing the nasal cavity with topical lidocaine and phenylephrine, the flexible endoscope was introduced and passed through the nasal cavity;  the majority of the pharynx and larynx appear normal however she does have some edema in the supraglottis in the right supraglottic region shows a lobulated surface where her tumor was previously.  Cannot discern with certainty whether there is residual disease however the mucosa appears relatively healthy.      Lab Findings: Lab Results  Component Value Date   WBC 6.9 01/08/2019   HGB 15.2 (H) 01/08/2019   HCT 45.9 01/08/2019   MCV 96.4 01/08/2019   PLT 270 01/08/2019    Lab Results  Component Value Date   TSH 0.511 08/05/2018    Radiographic Findings: Nm Pet Image Restag (ps) Skull Base To Thigh  Result Date: 02/05/2019 CLINICAL DATA:  Subsequent treatment strategy for supraglottic neoplasm. EXAM: NUCLEAR MEDICINE PET SKULL BASE TO THIGH TECHNIQUE: 8.43 mCi F-18 FDG was injected intravenously. Full-ring PET imaging was performed from the skull base to thigh after the radiotracer. CT data was obtained and used for attenuation correction and anatomic localization. Fasting blood glucose: 94 mg/dl COMPARISON:  07/23/2018 FINDINGS: Mediastinal blood pool activity: SUV max 2.5 NECK: Previous asymmetric hypermetabolism associated with right supraglottic neoplasm has significantly diminished in the interval. On today's study SUV max is  equal to 3.89. Previously 25.2. There is been interval resolution of FDG uptake associated with right supraclavicular lymph nodes. There is asymmetric increased uptake and corresponding soft tissue fullness localizing to the right tonsillar region with an SUV max of 6.62. On the contralateral side the SUV max is equal to 3.94. Incidental CT findings: none CHEST: No hypermetabolic axillary or supraclavicular nodes. No hypermetabolic mediastinal or hilar lymph nodes. No hypermetabolic pulmonary nodules. Incidental CT findings: Emphysema. Aortic atherosclerosis. Calcification in the LAD, left circumflex coronary arteries noted. ABDOMEN/PELVIS: No abnormal hypermetabolic activity within the liver, pancreas, adrenal glands, or spleen. No hypermetabolic lymph nodes in the abdomen or pelvis. Incidental CT findings: Aortic atherosclerosis. No aneurysm. Previous cholecystectomy. SKELETON: No focal hypermetabolic activity to suggest skeletal metastasis. Incidental CT findings: none IMPRESSION: 1. The hypermetabolic tumor involving the right piriform sinus/supraglottic region demonstrates significant interval response to therapy. 2. New asymmetric increased uptake within the right tonsillar region is identified with corresponding increased soft tissue fullness. Nonspecific. Recommend correlation with contrast enhanced CT or MRI and direct visualization to exclude underlying new site of disease. 3. Resolution of previous hypermetabolic  right supraclavicular nodes. 4. Aortic Atherosclerosis (ICD10-I70.0) and Emphysema (ICD10-J43.9). Coronary artery atherosclerotic calcifications. Electronically Signed   By: Kerby Moors M.D.   On: 02/05/2019 17:08    Impression/Plan:    1) Head and Neck Cancer Status: Good response according to PET scan, but she does have a high risk for recurrence due to noncompliance with treatment. Laryngoscopy shows lobulated supraglottic tissue   She cites insurance issues making it difficult for  her to follow-up with otolaryngology. Gayleen Orem, RN, our Head and Neck Oncology Navigator will try to navigate this to get her back into Dr. Pollie Friar clinic.  I will order a CT scan of her neck with contrast to take place in about 2 months prior to her follow-up with him.  Hopefully she will be eligible to see Dr. Lucia Gaskins by that time  2) Nutritional Status: Weight is fairly stable, she has been noncompliant with nutritionist referrals   3) Risk Factors: The patient has been educated about risk factors including alcohol and tobacco abuse; they understand that avoidance of alcohol and tobacco is important to prevent recurrences as well as other cancers Smoking again, AMA.  She understands the risks of this.   4) Swallowing:  Functional.    She has been noncompliant with speech-language pathology referrals - swallow as tolerated  5) Dental: Encouraged to continue regular followup with dentistry, and dental hygiene including fluoride rinses.   6) Thyroid function: check at next visit Lab Results  Component Value Date   TSH 0.511 08/05/2018    7)  Follow-up in August, and with ENT in May after CT scan. She plans to be out of town in early to mid May.  Review at tumor board for recommendations, will change plan if appropriate.    ________________________________   Eppie Gibson, MD  This document serves as a record of services personally performed by Eppie Gibson, MD. It was created on her behalf by Steva Colder, a trained medical scribe. The creation of this record is based on the scribe's personal observations and the provider's statements to them. This document has been checked and approved by the attending provider.

## 2019-02-06 ENCOUNTER — Encounter: Payer: Self-pay | Admitting: *Deleted

## 2019-02-06 ENCOUNTER — Other Ambulatory Visit: Payer: Self-pay

## 2019-02-06 ENCOUNTER — Ambulatory Visit
Admission: RE | Admit: 2019-02-06 | Discharge: 2019-02-06 | Disposition: A | Discharge status: Home / Self Care | Payer: Medicaid Other | Source: Ambulatory Visit | Attending: Radiation Oncology | Admitting: Radiation Oncology

## 2019-02-06 ENCOUNTER — Encounter: Payer: Self-pay | Admitting: Radiation Oncology

## 2019-02-06 VITALS — BP 116/85 | HR 90 | Temp 98.2°F | Resp 20 | Ht 63.0 in | Wt 154.2 lb

## 2019-02-06 DIAGNOSIS — F17210 Nicotine dependence, cigarettes, uncomplicated: Secondary | ICD-10-CM | POA: Diagnosis not present

## 2019-02-06 DIAGNOSIS — C321 Malignant neoplasm of supraglottis: Secondary | ICD-10-CM

## 2019-02-06 DIAGNOSIS — R682 Dry mouth, unspecified: Secondary | ICD-10-CM | POA: Diagnosis not present

## 2019-02-06 DIAGNOSIS — Z79899 Other long term (current) drug therapy: Secondary | ICD-10-CM | POA: Diagnosis not present

## 2019-02-06 DIAGNOSIS — Z9119 Patient's noncompliance with other medical treatment and regimen: Secondary | ICD-10-CM | POA: Insufficient documentation

## 2019-02-06 DIAGNOSIS — Z791 Long term (current) use of non-steroidal anti-inflammatories (NSAID): Secondary | ICD-10-CM | POA: Insufficient documentation

## 2019-02-06 DIAGNOSIS — Z923 Personal history of irradiation: Secondary | ICD-10-CM | POA: Insufficient documentation

## 2019-02-06 MED ORDER — LARYNGOSCOPY SOLUTION RAD-ONC
15.0000 mL | Freq: Once | TOPICAL | Status: AC
  Administered 2019-02-06: 15 mL via TOPICAL
  Filled 2019-02-06: qty 15

## 2019-02-06 NOTE — Progress Notes (Signed)
Pt here today for a follow-up. Pt states that she has neck pain that is an 8 out of 10. Pt states moderate fatigue. Pt states that she has trouble sleeping at night. Pt states that she has painful and difficulty swallowing. Pt states that some day are better than others. Pt states that she has dry mouth and thick saliva. Pt denies having any mouth sores. Pt states that she is not currently on chemotherapy treatments.   BP 116/85 (BP Location: Right Arm, Patient Position: Sitting)   Pulse 90   Temp 98.2 F (36.8 C)   Resp 20   Ht 5\' 3"  (1.6 m)   Wt 154 lb 3.2 oz (69.9 kg)   SpO2 97%   BMI 27.32 kg/m    Wt Readings from Last 3 Encounters:  02/06/19 154 lb 3.2 oz (69.9 kg)  01/08/19 157 lb 1.6 oz (71.3 kg)  01/07/19 159 lb (72.1 kg)

## 2019-02-07 NOTE — Progress Notes (Signed)
Oncology Nurse Navigator Documentation  To provide support, encouragement and care continuity, met with Sheri Rivera during Follow-up appt with Dr. Isidore Moos to discussion yesterday's PET results. She reported:  Still smoking several cigarettes daily.  Absence of taste sensation with the exception of coffee.  She recognized continued smoking impacts taste.  She was very anxious about results, "don't know whether I should make plans to live or plans to die".    Conducting neck massage for lymphedema per U-Tube video.  Swallowing function variable d/t intermittent thick mucous.  Feeling "trapped" and very anxious in her apartment b/c unable to drive having sold car to pay for recently incurred fines for driving with expired registration.    I reminder her Ander Purpura, LCSW, can facilitate SCAT services until her transportation situation improves.  She acknowledged.  She stated she is able to arrange rides through [employer] as another option.   Recently met with PCP Dr. Darron Doom who refilled anxiolytics until she has appt with her psychiatrist. I encouraged her to begin walking outside to help with anxiety and improve post-tmt stamina. She voiced understanding of:  Favorable PET results, remission status.  Plan to have CT neck in late May and follow-up with Dr. Lucia Gaskins post-CT.  She voiced understanding I will coordinate.  This navigator and Dr. Pearlie Oyster encouragement to stop smoking to improve over-all health and to maintain current remission.   Following appt, I escorted her to Lauren's office, facilitated her signing SCAT application.  Gayleen Orem, RN, BSN Head & Neck Oncology Nurse [place] at [place] [phone]

## 2019-02-07 NOTE — Progress Notes (Signed)
Oncology Nurse Navigator Documentation  Ms. Millis cancelled [facility] attendance.  Gayleen Orem, RN, BSN Head & Neck Oncology Nurse [place] at [place] [phone]

## 2019-02-09 ENCOUNTER — Other Ambulatory Visit: Payer: Self-pay | Admitting: Radiation Oncology

## 2019-02-09 ENCOUNTER — Encounter: Payer: Self-pay | Admitting: Radiation Oncology

## 2019-02-09 DIAGNOSIS — R634 Abnormal weight loss: Secondary | ICD-10-CM

## 2019-02-09 DIAGNOSIS — Z1329 Encounter for screening for other suspected endocrine disorder: Secondary | ICD-10-CM

## 2019-02-09 DIAGNOSIS — C321 Malignant neoplasm of supraglottis: Secondary | ICD-10-CM

## 2019-02-25 DIAGNOSIS — C321 Malignant neoplasm of supraglottis: Secondary | ICD-10-CM | POA: Diagnosis not present

## 2019-03-03 DIAGNOSIS — C320 Malignant neoplasm of glottis: Secondary | ICD-10-CM | POA: Diagnosis not present

## 2019-03-03 DIAGNOSIS — J449 Chronic obstructive pulmonary disease, unspecified: Secondary | ICD-10-CM | POA: Diagnosis not present

## 2019-03-03 DIAGNOSIS — J309 Allergic rhinitis, unspecified: Secondary | ICD-10-CM | POA: Diagnosis not present

## 2019-03-03 DIAGNOSIS — J019 Acute sinusitis, unspecified: Secondary | ICD-10-CM | POA: Diagnosis not present

## 2019-03-20 ENCOUNTER — Other Ambulatory Visit: Payer: Self-pay | Admitting: *Deleted

## 2019-03-20 ENCOUNTER — Telehealth: Payer: Self-pay | Admitting: *Deleted

## 2019-03-20 NOTE — Telephone Encounter (Signed)
Oncology Nurse Navigator Documentation  Returned Ms. Zeiser's call.  LVMM asking for call-back.  Gayleen Orem, RN, BSN Head & Neck Oncology Nurse [place] at [place] [phone]

## 2019-03-21 NOTE — Telephone Encounter (Signed)
Oncology Nurse Navigator Documentation  Returned 4/23 1425 VMM to pt.  LVMM asking for return call.  Gayleen Orem, RN, BSN Head & Neck Oncology Nurse [place] at [place] [phone]

## 2019-03-27 ENCOUNTER — Telehealth: Payer: Self-pay | Admitting: *Deleted

## 2019-03-27 DIAGNOSIS — C321 Malignant neoplasm of supraglottis: Secondary | ICD-10-CM | POA: Diagnosis not present

## 2019-03-27 NOTE — Telephone Encounter (Addendum)
Oncology Nurse Navigator Documentation  Returned call to [name]. She reported:  Has not returned calls because it has been difficult to talk/voice very raspy.  Not eating much d/t absence of taste sensation, occasional swallowing difficulty.  Has LVMM with PCP for referral to Dr. Lucia Gaskins per 3/13 follow-up with Dr. Isidore Moos. I encouraged her to persist with calls to PCP for referral to Dr. Lucia Gaskins so she can establish q 3 month alternating appts with him and Dr. Isidore Moos for post-RT surveillance.  I noted she has appt scheduled 8/28 for follow-up with Dr. Isidore Moos. I asked that she call me when appt scheduled with Dr. Lucia Gaskins.  She voiced agreement.  Gayleen Orem, RN, BSN Head & Neck Oncology Nurse [place] at [place] [phone]

## 2019-04-08 DIAGNOSIS — C320 Malignant neoplasm of glottis: Secondary | ICD-10-CM | POA: Diagnosis not present

## 2019-04-08 DIAGNOSIS — J449 Chronic obstructive pulmonary disease, unspecified: Secondary | ICD-10-CM | POA: Diagnosis not present

## 2019-04-08 DIAGNOSIS — J309 Allergic rhinitis, unspecified: Secondary | ICD-10-CM | POA: Diagnosis not present

## 2019-04-13 ENCOUNTER — Telehealth: Payer: Self-pay | Admitting: *Deleted

## 2019-04-13 NOTE — Telephone Encounter (Signed)
Oncology Nurse Navigator Documentation  Spoke with Sheri Rivera, ENT [place] office.  She confirmed receipt of referral from PCP Sheri Rivera's office last week 5/13. Sheri Rivera indicated she will confirm acceptance as normally he does not see Medicaid patients.  I stated he had agreed several months ago to see Sheri Rivera, she voiced understanding, noted she will speak with Sheri Rivera and get back to me.  Sheri Orem, RN, BSN Head & Neck Oncology Nurse [place] at [place] [phone]

## 2019-04-13 NOTE — Telephone Encounter (Signed)
Oncology Nurse Navigator Documentation  Received call from Kathrynn Speed, Ms. Digiacomo's counselor. She called, with Ms. Davison's permission. She asked about the status of follow-up with ENT and PT for tmt of lymphedema. I explained:  ENT appt can be scheduled upon receipt of referral from PCP which per my 5/1 conversation with Ms. Sherryl Barters had not been submitted despite Danyka's repeated requests.    Appt with PT lymphedema will nessitate Ms.Gallentine travel to [place] for repeat appts which she has previously indicated not possible b/c she does not have own car. Ms. Baltazar Najjar indicated Joelyn now has her own car so transportation to ENT or PT appts is no longer a barrier. She asked for for my assistance in facilitating follow-ups as Ms. Perham is struggling with coordination of these appts.  I agreed to do so.  Gayleen Orem, RN, BSN Head & Neck Oncology Nurse [place] at [place] [phone] .

## 2019-04-22 ENCOUNTER — Telehealth: Payer: Self-pay | Admitting: *Deleted

## 2019-04-22 ENCOUNTER — Other Ambulatory Visit: Payer: Self-pay | Admitting: *Deleted

## 2019-04-22 DIAGNOSIS — C321 Malignant neoplasm of supraglottis: Secondary | ICD-10-CM

## 2019-04-22 NOTE — Telephone Encounter (Signed)
Oncology Nurse Navigator Documentation  Called Ms. Monia Pouch earlier today to check on status of follow-up with ENT [facility].    She stated she had not received call s/p my 5/18 call to his office and my conversation with [facility]..  Called Dr. Pollie Friar office, spoke with Gay Filler who indicated they have called her x5 to schedule appt but she has yet to return call.  I indicated she should be available as I had just spoken with her.  Received 1335 VMM from Ms. Monia Pouch indicating she has appt with Dr. Lucia Gaskins next Monday, 6/1.  Note:  I placed new referral for PT Lymphedema as previous order has expired.  Gayleen Orem, RN, BSN Head & Neck Oncology Nurse [place] at [place] [phone]

## 2019-04-27 DIAGNOSIS — C140 Malignant neoplasm of pharynx, unspecified: Secondary | ICD-10-CM | POA: Diagnosis not present

## 2019-04-27 DIAGNOSIS — C321 Malignant neoplasm of supraglottis: Secondary | ICD-10-CM | POA: Diagnosis not present

## 2019-04-27 DIAGNOSIS — R490 Dysphonia: Secondary | ICD-10-CM | POA: Diagnosis not present

## 2019-05-05 ENCOUNTER — Ambulatory Visit: Payer: Medicaid Other | Admitting: Physical Therapy

## 2019-05-06 ENCOUNTER — Other Ambulatory Visit: Payer: Self-pay

## 2019-05-06 ENCOUNTER — Ambulatory Visit: Payer: Medicaid Other | Attending: Radiation Oncology | Admitting: Physical Therapy

## 2019-05-06 DIAGNOSIS — M436 Torticollis: Secondary | ICD-10-CM | POA: Diagnosis present

## 2019-05-06 DIAGNOSIS — L599 Disorder of the skin and subcutaneous tissue related to radiation, unspecified: Secondary | ICD-10-CM

## 2019-05-06 DIAGNOSIS — I890 Lymphedema, not elsewhere classified: Secondary | ICD-10-CM

## 2019-05-06 DIAGNOSIS — R293 Abnormal posture: Secondary | ICD-10-CM

## 2019-05-06 NOTE — Therapy (Signed)
[place], [place], [address] Phone: [phone]   Fax:  [phone]  Physical Therapy Evaluation  Patient Details  Name: Sheri Rivera MRN: [id number] Date of Birth: 12-05-1960 Referring Provider (PT): Dr. Eppie Gibson   Encounter Date: 05/06/2019  PT End of Session - 05/06/19 1619    Visit Number  1    Number of Visits  4    Date for PT Re-Evaluation  06/05/19    PT Start Time  7672    PT Stop Time  1605    PT Time Calculation (min)  35 min    Activity Tolerance  Patient tolerated treatment well    Behavior During Therapy  [facility] for tasks assessed/performed       Past Medical History:  Diagnosis Date  . Anxiety 11/11/11  . Arthritis   . Asthma   . Bursitis    right shoulder  . Chronic back pain   . Chronic neck pain   . Chronic shoulder pain   . COPD (chronic obstructive pulmonary disease) ([place])    continues to smoke 1ppd  . DDD (degenerative disc disease)   . Depression   . Fibromyalgia   . GERD (gastroesophageal reflux disease)   . Heart murmur    as a child per pt  . Herpes   . History of radiation therapy 08/12/18- 10/03/18   Larynx, prescribed dose of 63 Gy in 28 fractions. She chose to stop treatments after 27 fractions.   . IBS (irritable bowel syndrome)   . Lumbar radiculopathy   . Seasonal allergies   . Smoker   . Thyroid nodule    goiters    Past Surgical History:  Procedure Laterality Date  . CHOLECYSTECTOMY    . DIRECT LARYNGOSCOPY N/A 07/11/2018   Procedure: DIRECT LARYNGOSCOPY WITH BIOPSY;  Surgeon: Rozetta Nunnery, MD;  Location: [facility];  Service: ENT;  Laterality: N/A;  . HERNIA REPAIR     [place]   . HIATAL HERNIA REPAIR    . TUBAL LIGATION  1983    There were no vitals filed for this visit.   Subjective Assessment - 05/06/19 1536    Subjective  Pt states she is having swelling in her neck.  Her ENT said that it looks swollen on  the outside but it is even more swollen on the inside. She said it has been getting worse since February.  She also has had changes to her voice  She said she would have been here sooner but she didn't have transportation     Pertinent History  Diagnosis is supraglottic squamous cell carcinoma, stage IVA (cT3, cN2b, cM0). Plan is for RT starting 9/17 in 35 fractions to tumor base and bilateral neck nodes that was completed Nov. 8 History includes anxiety, arthritis, bursitis, chronic back and neck pain, chronic shoulder pain, COPD, DDD, fibromyalgia, lumbar radiculopathy. Pt. reports a goiter.    Patient Stated Goals  to get her regular voice back     Currently in Pain?  No/denies         [facility] PT Assessment - 05/06/19 0001      Assessment   Medical Diagnosis  a    Referring Provider (PT)  Dr. Eppie Gibson    Onset Date/Surgical Date  07/10/18   approx.   Prior Therapy  none      Precautions   Precautions  Other (comment)    Precaution Comments  cancer precautions  Restrictions   Weight Bearing Restrictions  No      Balance Screen   Has the patient fallen in the past 6 months  No    Has the patient had a decrease in activity level because of a fear of falling?   No    Is the patient reluctant to leave their home because of a fear of falling?   No      Home Environment   Living Environment  Private residence    Living Arrangements  Alone    Type of [place] to enter    Entrance Stairs-Number of Steps  2      Prior Function   Level of Independence  Independent   but ambulatory mobility limited due to pain   Vocation  On disability    Leisure  walks her two dogs outside short distances 6 times a day; does some gardening      Cognition   Overall Cognitive Status  Within Functional Limits for tasks assessed      Observation/Other Assessments   Observations  --      Coordination   Gross Motor Movements are Fluid and Coordinated  Yes       Functional Tests   Functional tests  Sit to Stand      Sit to Stand   Comments  11      Posture/Postural Control   Posture/Postural Control  Postural limitations    Postural Limitations  Forward head;Flexed trunk      AROM   Overall AROM Comments  decreased neck ROM to about 25% in rotation and lateral flexion pt has decreased shoulder ROM ( about 140 degrees elevation on both sides) with wincing from pain when shoulders are returned to sides      Ambulation/Gait   Ambulation/Gait  --    Ambulation/Gait Assistance  --    Gait Pattern  --    Stairs  --    Gait Comments  --        LYMPHEDEMA/ONCOLOGY QUESTIONNAIRE - 05/06/19 1612      Type   Cancer Type  supraglottic squamous cell      Treatment   Past Radiation Treatment  Yes    Date  10/03/18    Body Site  tumor site, bilateral neck nodes      What other symptoms do you have   Are you Having Heaviness or Tightness  Yes    Are you having Pain  No    Are you having pitting edema  Yes    Other Symptoms  decreased voice       Lymphedema Assessments   Lymphedema Assessments  Head and Neck      Head and Neck   4 cm superior to sternal notch around neck  38 cm    6 cm superior to sternal notch around neck  39.5 cm    8 cm superior to sternal notch around neck  40 cm    Other  feels firm and pitting evident after 15 minutes of chip pack              Objective measurements completed on examination: See above findings.      [facility] Adult PT Treatment/Exercise - 05/06/19 0001      Self-Care   Self-Care  Other Self-Care Comments    Other Self-Care Comments   pt given a chip pack for neck and asked to wear it 2-5 times  a day, reviewed initial skin stretch on chest from MLD and diaphragmatic breathing                   PT Long Term Goals - 05/06/19 1627      PT LONG TERM GOAL #1   Title  pt will have decrease in circumference of neck at 6 cm above the sternal notch to 38.5 cm     Baseline  39.5 cm      Time  4    Period  Weeks    Status  New      PT LONG TERM GOAL #2   Title  Pt will have report she knows how to do self manual lymph drainage and use compression garment to manage lymphedema at home     Baseline  no knowledge     Time  4    Period  Weeks    Status  New             Plan - 05/06/19 1619    Clinical Impression Statement  Pt comes to PT with head and neck lymphedema after radiation for supraglottic squamous cell cancer. She says it has been worsening and she is having trouble with her voice and occasional difficulty swallowing.  She wants to learn what to do to get rid of the swelling and hopefully get her voice back . Anticipate she will respond to MLD and compression.  Will send demographics to [facility] for head and neck garment (possilbly [employer]) .  She may benefit from a Head and Neck Flexitouch if she responds well to MLD     Personal Factors and Comorbidities  Comorbidity 3+    Comorbidities  COPD. chronic back pain, previous neck radiation     Examination-Activity Limitations  Sleep   pt needs to keep head elevated so she does not feel like she is choking    Stability/Clinical Decision Making  Stable/Uncomplicated    Clinical Decision Making  Low    Clinical Presentation due to:  no more cancer treatments planned     Rehab Potential  Good    PT Frequency  1x / week    PT Duration  3 weeks    PT Treatment/Interventions  Patient/family education;ADLs/Self Care Home Management;Therapeutic exercise;Therapeutic activities;Manual techniques;Taping;Compression bandaging;Manual lymph drainage;Orthotic Fit/Training    PT Next Visit Plan  Begin complete decongestive therapy for head and neck, teach MLD, reinforce ROM exercise and use of chip pack, check with Melissa to make sure that pt garment is ordered., consider Flexitouch      Consulted and Agree with Plan of Care  Patient       Patient will benefit from skilled therapeutic intervention in order to improve the  following deficits and impairments:  Postural dysfunction, Abnormal gait, Decreased range of motion, Pain, Decreased mobility, Decreased activity tolerance, Impaired UE functional use, Decreased skin integrity, Increased edema, Decreased knowledge of precautions, Decreased knowledge of use of DME  Visit Diagnosis: Abnormal posture - Plan: PT plan of care cert/re-cert  Lymphedema, not elsewhere classified - Plan: PT plan of care cert/re-cert  Stiffness of neck - Plan: PT plan of care cert/re-cert  Disorder of the skin and subcutaneous tissue related to radiation, unspecified - Plan: PT plan of care cert/re-cert     Problem List Patient Active Problem List   Diagnosis Date Noted  . Malignant neoplasm of supraglottis ([place]) 07/29/2018  . Acute respiratory failure with hypoxia ([place]) 08/03/2017  . GERD (gastroesophageal reflux disease)  06/13/2016  . Chronic obstructive pulmonary disease ([place]) 06/13/2016  . Current tobacco use 09/05/2015  . History of fracture of vertebra 02/16/2015  . Diffuse pain 04/03/2014  . Hemorrhage, postmenopausal 12/29/2013  . Chronic obstructive pulmonary disease with acute exacerbation ([place]) 10/30/2013  . COPD mixed type ([place]) 03/30/2013  . Abdominal pain, epigastric 02/25/2013  . Dysphagia, unspecified(787.20) 02/25/2013  . Gas 02/25/2013  . Abdominal bloating 02/25/2013  . Precordial pain 02/16/2013  . Multinodular goiter 10/05/2012  . 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 Bladder cystocele 07/21/2012  . Clinical depression 11/11/2011  . Allergic rhinitis 11/11/2011  . URI (upper respiratory infection) 09/28/2011  . Sore throat 07/18/2011  . Neck swelling 06/22/2011  . Ingrown right big toenail 05/23/2011  . Chronic back pain 03/16/2011  . 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/05/2010  . Shortness of breath 03/15/2010  . GOITER, MULTINODULAR 07/01/2009  . FIBROMYALGIA 07/01/2009  . ALLERGIC RHINITIS CAUSE UNSPECIFIED 03/02/2008  . Anxiety 01/23/2007   . TOBACCO DEPENDENCE 01/23/2007  . GASTROESOPHAGEAL REFLUX, NO ESOPHAGITIS 01/23/2007   Donato Heinz. Owens Shark PT  [place] Levo 05/06/2019, 4:31 PM  [place], [place], [address] Phone: [phone]   Fax:  [phone]  Name: Sheri Rivera MRN: [id number] Date of Birth: [birth year]

## 2019-05-14 ENCOUNTER — Telehealth: Payer: Self-pay

## 2019-05-14 ENCOUNTER — Ambulatory Visit: Payer: Medicaid Other

## 2019-05-14 NOTE — Telephone Encounter (Signed)
Pt no showed for her appointment with physical therapy. Called and left voice message requesting pt to call us back so we can reschedule.

## 2019-05-19 ENCOUNTER — Telehealth: Payer: Self-pay | Admitting: *Deleted

## 2019-05-19 ENCOUNTER — Telehealth: Payer: Self-pay

## 2019-05-19 NOTE — Telephone Encounter (Signed)
Spoke with Sheri Rivera after calling her in regards to second missed appointment. She answered and shared that she does not have any gas money to make her appts right now and she didn't call us as she had a sore throat and lost her voice. She has spoken with someone at the [facility] to let them know she is struggling to make appts and was told that a Social Worker named Sheri Rivera would call her but Sheri Rivera has yet to hear from them. Informed pt about [facility] and how this could be a possibility for her and that she should call and ask for Social Worker if she doesn't hear from [employer] in next day or 2. Pt verbalized understanding and appreciated the information.

## 2019-05-20 DIAGNOSIS — J309 Allergic rhinitis, unspecified: Secondary | ICD-10-CM | POA: Diagnosis not present

## 2019-05-20 DIAGNOSIS — C320 Malignant neoplasm of glottis: Secondary | ICD-10-CM | POA: Diagnosis not present

## 2019-05-20 DIAGNOSIS — J449 Chronic obstructive pulmonary disease, unspecified: Secondary | ICD-10-CM | POA: Diagnosis not present

## 2019-05-20 NOTE — Telephone Encounter (Signed)
Oncology Nurse Navigator Documentation  Received call from [name].  She expressed anxiety re her ability to adhere to newly announced mask requirement in [place] d/t her COPD. She stated she has tried wearing a mask when out in public but is unable to go but a few steps before breathing becomes extremely difficult.   She reported:  Had post-RT follow-up with Dr. Lucia Gaskins that included laryngoscopy with no concerning findings.  She stated she continues to have persistent R ear pain as she has previously reported.  I encouraged her to call Dr. Lucia Gaskins to address.  Continues to mildly choke on occasion when eating.  She denied conducting HEP prescribed by SLP.  Saliva continues to be thick, causes her to choke.  Dr. Lucia Gaskins Rxed Mucinex to help with mgt.  Seeing PT for lymphedema tmt.  Still smoking d/t stress of COVID, difficulty making car payments.  Noted "back on patch".    Lacks appetite in part b/c "can't taste food".  Drinking "lots of water and Gatorade".  Navigator Interventions:  Suggested homemade mask design that can meet compliance while meeting her need. I encouraged her to call her pulmonologist for further guidance.  Encouraged mild exercise in comfort of home and/or socially distanced walking in the evening to help reduce stress.  Encouraged HEP as prescribed by SLP to optimize swallowing function.  I encouraged her to call me with further needs/concerns.  Gayleen Orem, RN, BSN Head & Neck Oncology Nurse [place] at [place] [phone]

## 2019-05-21 ENCOUNTER — Ambulatory Visit: Payer: Medicaid Other | Admitting: Physical Therapy

## 2019-05-27 DIAGNOSIS — C321 Malignant neoplasm of supraglottis: Secondary | ICD-10-CM | POA: Diagnosis not present

## 2019-06-27 DIAGNOSIS — C321 Malignant neoplasm of supraglottis: Secondary | ICD-10-CM | POA: Diagnosis not present

## 2019-07-16 ENCOUNTER — Telehealth: Payer: Self-pay | Admitting: *Deleted

## 2019-07-16 ENCOUNTER — Other Ambulatory Visit: Payer: Self-pay

## 2019-07-16 DIAGNOSIS — Z20822 Contact with and (suspected) exposure to covid-19: Secondary | ICD-10-CM

## 2019-07-16 NOTE — Telephone Encounter (Signed)
Oncology Nurse Navigator Documentation  Ms. Hollinger call to inquire about next appt with Dr. Isidore Moos as she is experiencing "throat issues": tightness, difficulty swallowing pills/food, absence of taste, dry mouth. I confirmed follow-up appt scheduled for next Friday, 8/28, with lab 2:15, Dr. Isidore Moos 2:40.  She voiced understanding. I provided her phone # for [facility], "I need to schedule appt, lost number".  Gayleen Orem, RN, BSN Head & Neck Oncology Nurse [place] at [place] [phone]

## 2019-07-17 ENCOUNTER — Ambulatory Visit: Payer: Medicaid Other

## 2019-07-17 LAB — NOVEL CORONAVIRUS, NAA: SARS-CoV-2, NAA: NOT DETECTED

## 2019-07-21 NOTE — Progress Notes (Signed)
Sheri Rivera presents for follow up of radiation completed 10/03/2018 to her Larynx and bilateral neck.     Pain issues, if any: She has pain to her ears and throat at times.  Using a feeding tube?: No Weight changes, if any:  Wt Readings from Last 3 Encounters:  07/24/19 142 lb 12.8 oz (64.8 kg)  02/06/19 154 lb 3.2 oz (69.9 kg)  01/08/19 157 lb 1.6 oz (71.3 kg)   Swallowing issues, if any: She gets choked on foods at times. She needs to swallow some foods with water. She tells me that if she has to use water to swallow food she will choke at times. Smoking or chewing tobacco? She is smoking 4-5 cigarettes daily.  Using fluoride trays daily?  Last ENT visit was on: Dr. Lucia Gaskins 04/27/19. Laryngoscopy completed.  Other notable issues, if any:  She reports no return of her taste buds since radiation.  She continues to have a dry throat with thick saliva.  She tells me she has decreased desire to eat. She is not hungry and will not eat during the day at times.   BP 114/83   Pulse 92   Temp 97.8 F (36.6 C) (Tympanic)   Wt 142 lb 12.8 oz (64.8 kg)   SpO2 99% Comment: room air  BMI 25.30 kg/m

## 2019-07-24 ENCOUNTER — Encounter: Payer: Self-pay | Admitting: Radiation Oncology

## 2019-07-24 ENCOUNTER — Other Ambulatory Visit: Payer: Self-pay

## 2019-07-24 ENCOUNTER — Ambulatory Visit
Admission: RE | Admit: 2019-07-24 | Discharge: 2019-07-24 | Disposition: A | Discharge status: Home / Self Care | Payer: Medicaid Other | Source: Ambulatory Visit | Attending: Radiation Oncology | Admitting: Radiation Oncology

## 2019-07-24 DIAGNOSIS — Z8521 Personal history of malignant neoplasm of larynx: Secondary | ICD-10-CM | POA: Insufficient documentation

## 2019-07-24 DIAGNOSIS — F17210 Nicotine dependence, cigarettes, uncomplicated: Secondary | ICD-10-CM | POA: Insufficient documentation

## 2019-07-24 DIAGNOSIS — C321 Malignant neoplasm of supraglottis: Secondary | ICD-10-CM

## 2019-07-24 DIAGNOSIS — Z79899 Other long term (current) drug therapy: Secondary | ICD-10-CM | POA: Insufficient documentation

## 2019-07-24 DIAGNOSIS — Z923 Personal history of irradiation: Secondary | ICD-10-CM | POA: Diagnosis not present

## 2019-07-24 DIAGNOSIS — R630 Anorexia: Secondary | ICD-10-CM | POA: Insufficient documentation

## 2019-07-24 DIAGNOSIS — R634 Abnormal weight loss: Secondary | ICD-10-CM

## 2019-07-24 DIAGNOSIS — Z1329 Encounter for screening for other suspected endocrine disorder: Secondary | ICD-10-CM

## 2019-07-24 DIAGNOSIS — R1310 Dysphagia, unspecified: Secondary | ICD-10-CM | POA: Insufficient documentation

## 2019-07-24 LAB — T4, FREE: Free T4: 0.8 ng/dL (ref 0.61–1.12)

## 2019-07-24 NOTE — Progress Notes (Signed)
Radiation Oncology         [phone]) [phone] ________________________________  Name: Sheri Rivera MRN: [id number]  Date: 07/24/2019  DOB: 1961-07-24  Follow-Up Visit Note  CC: Vonna Drafts, FNP  [name] E, *  Diagnosis and Prior Radiotherapy:       ICD-10-CM   1. Malignant neoplasm of supraglottis ([place])  C32.1 BUN & Creatinine ([place])    CT Soft Tissue Neck W Contrast   08/12/18 - 10/03/18: Larynx and bilateral neck / Total dose received: 60.3 Gy, varied fractionation, in 27 fractions.  CHIEF COMPLAINT:  Here for follow-up and surveillance of laryngeal cancer  Narrative:  The patient returns today for routine follow-up. She returned to Dr. Lucia Gaskins on 04/27/2019, who notes no evidence of persistent disease on clinical exam.   Since her last visit, she has not undergone any studies. A neck CT was requested but never performed. This may be due to COVID, she is not sure.  Of note, she underwent Covid-19 testing on 07/16/2019, which was negative.  Pain issues, if any: She has pain to her ears and throat at times.   Using a feeding tube?: No  Weight changes: Wt Readings from Last 3 Encounters:  07/24/19 142 lb 12.8 oz (64.8 kg)  02/06/19 154 lb 3.2 oz (69.9 kg)  01/08/19 157 lb 1.6 oz (71.3 kg)  Swallowing issues, if any: She gets choked on foods at times. She needs to swallow some foods with water. She tells me that if she has to use water to swallow food she will choke at times.  Smoking or chewing tobacco? She is smoking 4-5 cigarettes daily.   Other notable issues: She reports her taste has not returned since completing radiation. She states she has decreased appetite and will not eat during the day sometimes. She also reports continued dry mouth with thick saliva.   ALLERGIES:  is allergic to clindamycin/lincomycin; cymbalta [duloxetine hcl]; levaquin [levofloxacin in d5w]; lyrica [pregabalin]; shellfish allergy; sulfamethoxazole; and codeine.  Meds: Current  Outpatient Medications  Medication Sig Dispense Refill  . albuterol (PROVENTIL HFA;VENTOLIN HFA) 108 (90 BASE) MCG/ACT inhaler Inhale 2 puffs into the lungs every 6 (six) hours as needed for shortness of breath. Shortness of breath    . albuterol (PROVENTIL) (2.5 MG/3ML) 0.083% nebulizer solution Take 3 mLs (2.5 mg total) by nebulization every 4 (four) hours as needed for wheezing or shortness of breath. Shortness of breath 25 vial 0  . ibuprofen (ADVIL,MOTRIN) 600 MG tablet Take 600 mg by mouth 2 (two) times daily.    Marland Kitchen LORazepam (ATIVAN) 0.5 MG tablet Take 1 tab PRN extreme anxiety, not to exceed once daily. Use sparingly. Future Rx only at the discretion of the primary care provider. 20 tablet 0  . Pseudoephedrine-guaiFENesin (MUCINEX D PO) Take 600 mg by mouth. Mucinex every 12 hours daily.    . nicotine (NICODERM CQ - DOSED IN MG/24 HOURS) 21 mg/24hr patch      No current facility-administered medications for this encounter.     Physical Findings: The patient is in no acute distress. Patient is alert and oriented. Wt Readings from Last 3 Encounters:  07/24/19 142 lb 12.8 oz (64.8 kg)  02/06/19 154 lb 3.2 oz (69.9 kg)  01/08/19 157 lb 1.6 oz (71.3 kg)    weight is 142 lb 12.8 oz (64.8 kg). Her tympanic temperature is 97.8 F (36.6 C). Her blood pressure is 114/83 and her pulse is 92. Her oxygen saturation is 99%. Marland Kitchen  General: Alert and oriented, in no acute distress HEENT: Head is normocephalic. Extraocular movements are intact. Upper oropharynx and oral cavity are clear.  Neck: Mild to moderate edema in anterior neck.  No adenopathy appreciated . Subtle fullness in right neck Lymphatics: see Neck Exam Neurologic: Cranial nerves II through XII are grossly intact. No obvious focalities. Speech is fluent. Coordination is intact. Psychiatric: Judgment and insight are intact. Affect is appropriate.       Lab Findings: Lab Results  Component Value Date   WBC 6.9 01/08/2019   HGB  15.2 (H) 01/08/2019   HCT 45.9 01/08/2019   MCV 96.4 01/08/2019   PLT 270 01/08/2019    Lab Results  Component Value Date   TSH 0.511 08/05/2018    Radiographic Findings: No results found.  Impression/Plan:    1) Head and Neck Cancer Status: Fever no evidence of disease.  Will reorder the CT scan of her neck to follow-up on the nonspecific findings from her PET scan that warranted surveillance.  I will call her with the results  2) Nutritional Status: Continue to push p.o. intake; she reports lack of taste is bothersome.  I reiterated that smoking will make it difficult for her to regain a sense of taste.  Right now she does not seem motivated to quit smoking.  3) Risk Factors: The patient has been educated about risk factors including alcohol and tobacco abuse; they understand that avoidance of alcohol and tobacco is important to prevent recurrences as well as other cancers Smoking again, AMA.  She understands the risks of this.   4) Swallowing:  Functional.    She has been noncompliant with speech-language pathology referrals - swallow as tolerated   5) Dental: Encouraged to continue regular followup with dentistry, and dental hygiene including fluoride rinses.   6) Thyroid function: Results pending Lab Results  Component Value Date   TSH 0.511 08/05/2018    7)   Follow-up in 1 year.  Patient instructed to have her laryngoscopies with otolaryngology until then as we do not have the adequate PPE to perform laryngoscopies in the cancer center.  It looks like the CT scan of her neck that I ordered in March was not performed in May due to [place].  I will try to get that rescheduled as soon as possible for surveillance... And call her with the results  In a face-to-face visit lasting 15 minutes, greater than 50% of the time was spent face to face discussing her condition, and coordinating the patient's care.      ________________________________   Eppie Gibson, MD  This document  serves as a record of services personally performed by Eppie Gibson, MD. It was created on her behalf by Wilburn Mylar, a trained medical scribe. The creation of this record is based on the scribe's personal observations and the provider's statements to them. This document has been checked and approved by the attending provider.

## 2019-07-27 ENCOUNTER — Telehealth: Payer: Self-pay

## 2019-07-27 LAB — TSH: TSH: 3.376 u[IU]/mL (ref 0.308–3.960)

## 2019-07-27 NOTE — Telephone Encounter (Signed)
Per Dr. Pearlie Oyster request, I called and spoke to Ms. Bendick to inform her that her recent thyroid lab test was normal. Ms. Peers voiced her appreciation and knows to call me if she has any further questions or concerns.

## 2019-08-28 NOTE — Telephone Encounter (Signed)
error 

## 2019-09-01 ENCOUNTER — Telehealth: Payer: Self-pay | Admitting: *Deleted

## 2019-09-01 NOTE — Telephone Encounter (Signed)
CALLED PATIENT TO INFORM OF CT FOR 09-03-19 - ARRIVAL TIME- 2:15 PM @ [facility], PT. TO HAVE WATER ONLY STARTING @ 10:30 AM, PATIENT TO RECEIVE RESULTS ON 09-04-19 @ 11:40 AM FROM DR. SQUIRE, LVM FOR A RETURN CALL

## 2019-09-02 ENCOUNTER — Telehealth: Payer: Self-pay | Admitting: *Deleted

## 2019-09-02 NOTE — Telephone Encounter (Signed)
CALLED PATIENT TO INFORM OF SCAN FOR 09-03-19, LVM FOR A RETURN CALL

## 2019-09-03 ENCOUNTER — Ambulatory Visit (HOSPITAL_COMMUNITY): Admission: RE | Admit: 2019-09-03 | Payer: Medicaid Other | Source: Ambulatory Visit

## 2019-09-03 ENCOUNTER — Telehealth: Payer: Self-pay | Admitting: *Deleted

## 2019-09-03 NOTE — Telephone Encounter (Signed)
Called patient to inform of STAT labs on 09-10-19 - lab appt. On 09-10-19  @ 12:45 pm @ [place] and her CT on 09-10-19 - arrival time- 1:45 pm @ [facility], patient to have water only - 4 hrs. prior to test and patient to follow-up with Dr. Isidore Moos for results on 09-15-19, lvm for a return call

## 2019-09-03 NOTE — Telephone Encounter (Signed)
[place] Work  Clinical Social Work received voicemail from patient.  CSW attempted to call patient back, left VM to return call.  Gwinda Maine, LCSW  Clinical Social Worker [facility]

## 2019-09-04 ENCOUNTER — Ambulatory Visit: Payer: Medicaid Other | Admitting: Radiation Oncology

## 2019-09-04 ENCOUNTER — Other Ambulatory Visit: Payer: Self-pay

## 2019-09-04 DIAGNOSIS — Z20822 Contact with and (suspected) exposure to covid-19: Secondary | ICD-10-CM

## 2019-09-06 LAB — NOVEL CORONAVIRUS, NAA: SARS-CoV-2, NAA: NOT DETECTED

## 2019-09-08 ENCOUNTER — Telehealth: Payer: Self-pay | Admitting: *Deleted

## 2019-09-08 NOTE — Telephone Encounter (Signed)
Called patient to inform that test has not been precerted yet, and that it will need to have peer to peer before they can precert this test and it can be scheduled, (test is cancelled at this point)  lvm for a return call

## 2019-09-10 ENCOUNTER — Ambulatory Visit (HOSPITAL_COMMUNITY): Payer: Medicaid Other

## 2019-09-10 ENCOUNTER — Ambulatory Visit: Payer: Medicaid Other

## 2019-09-15 ENCOUNTER — Ambulatory Visit: Payer: Self-pay | Admitting: Radiation Oncology

## 2019-10-21 ENCOUNTER — Telehealth: Payer: Self-pay | Admitting: *Deleted

## 2019-10-21 NOTE — Telephone Encounter (Signed)
Oncology Nurse Navigator Documentation  Rec'd VMM from [facility] requesting appt with ENT Lucia Gaskins for laryngoscopy b/c of swallowing concerns, throat soreness, "I know my cancer is back".  She also inquired about insurance denial for CT scan ordered several months ago.  Spoke with Pam at Dr. Pollie Friar office, shared above.  She indicated she will call her to set upappt for next week.  Returned call to [place], [facility] indicating she will be getting call from Dr. Pollie Friar office, provided Dr. Pollie Friar phone # for future reference.  Gayleen Orem, RN, BSN Head & Neck Oncology Nurse [place] at [place] [phone]

## 2019-11-02 ENCOUNTER — Telehealth: Payer: Self-pay | Admitting: *Deleted

## 2019-11-02 NOTE — Telephone Encounter (Signed)
Called patient to ask when she would be willing to go get her CT, lvm for a return call

## 2019-12-05 IMAGING — MR MR LUMBAR SPINE W/O CM
4 of 5 series · 26 of 48 positions shown · non-contrast
Comparison: 01/30/2015 lumbar spine MRI.

CLINICAL DATA: 56 y/o F; chronic lower back pain with right hip and
leg pain. Right leg numbness.

EXAM:
MRI LUMBAR SPINE WITHOUT CONTRAST
TECHNIQUE: Multiplanar, multisequence MR imaging of the lumbar spine was
performed. No intravenous contrast was administered.

[Series 3: T2 post-contrast · sagittal · 4.0mm · 0.55mm/px · 6 of 15 slices shown]
[im 1/15]
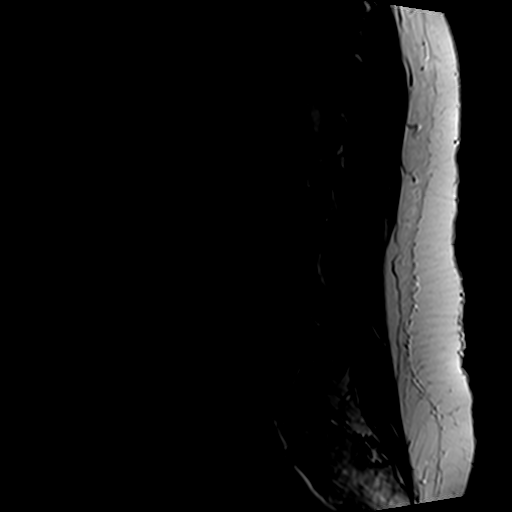
[im 3/15]
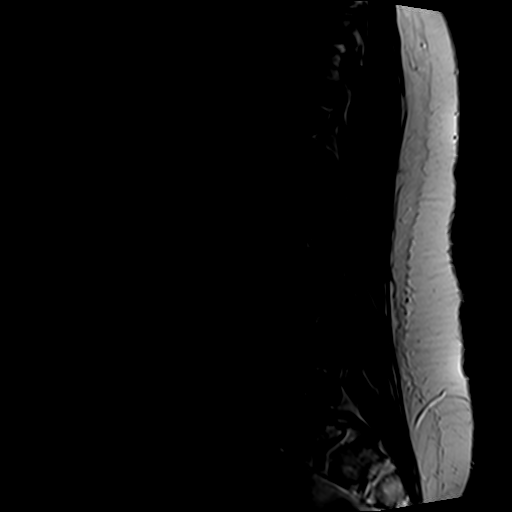
[im 6/15]
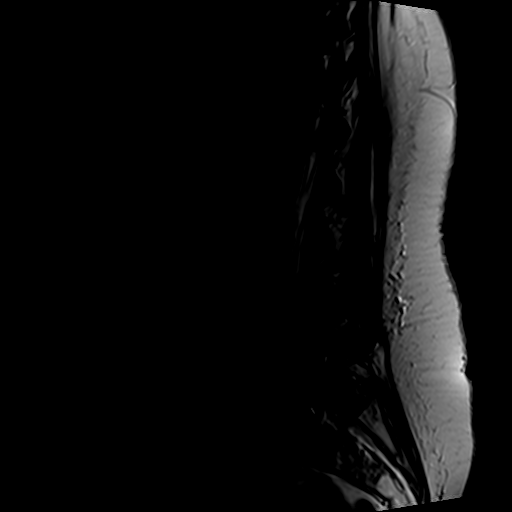
[im 9/15]
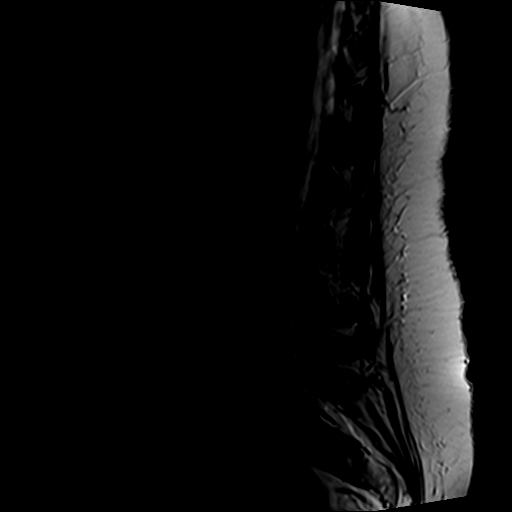
[im 12/15]
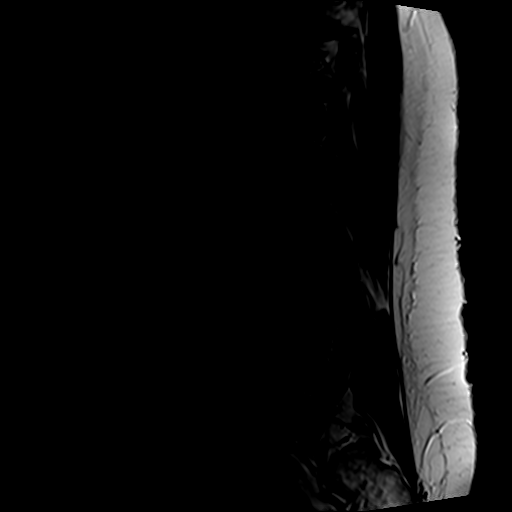
[im 15/15]
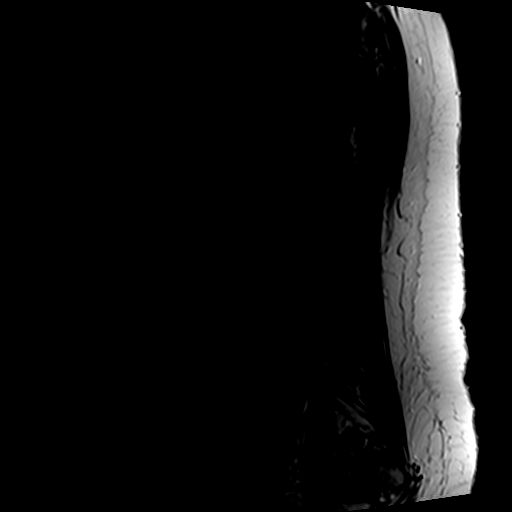

[Series 5: T1 · sagittal · 4.0mm · 0.55mm/px · 6 of 15 slices shown (1 of 2)]
[im 1/15]
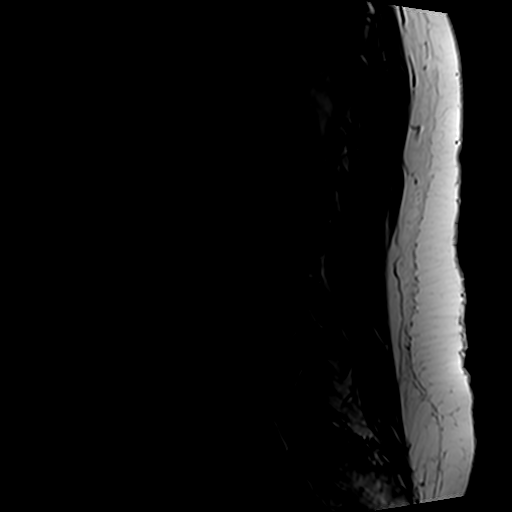
[im 3/15]
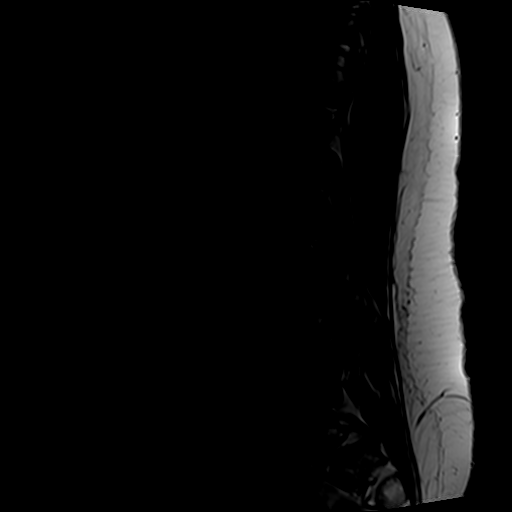
[im 6/15]
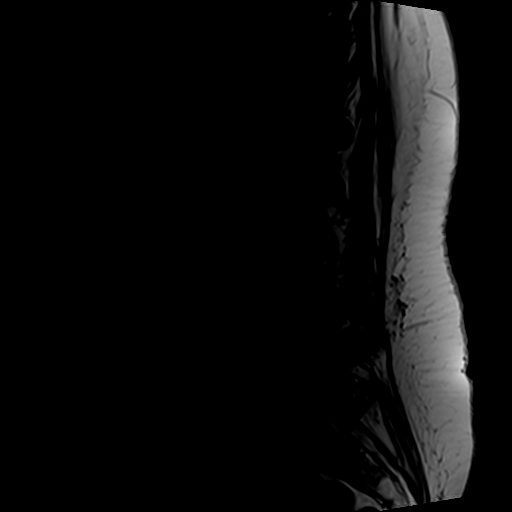
[im 9/15]
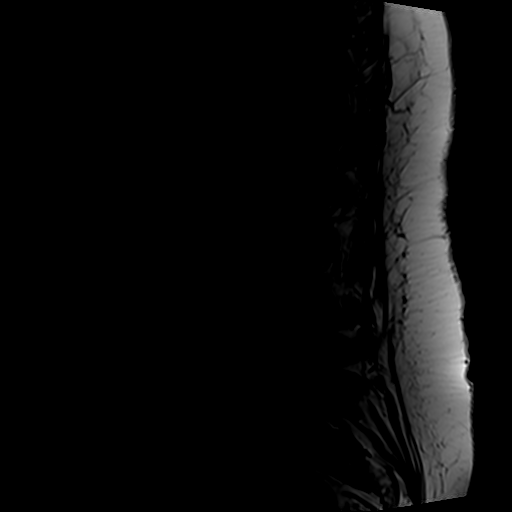
[im 12/15]
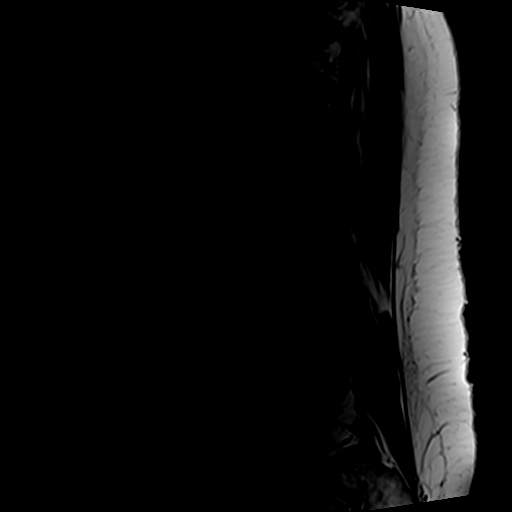
[im 15/15]
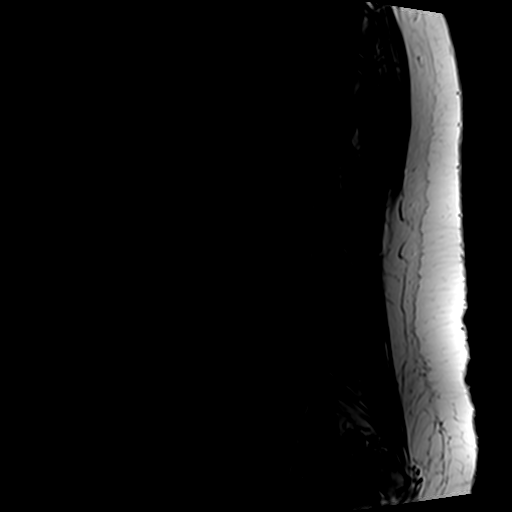

[Series 6: T1 · axial · 4.0mm · 0.35mm/px · z∈[-130,+37]mm · 5 of 35 slices shown (2 of 2)]
[im 1/35]
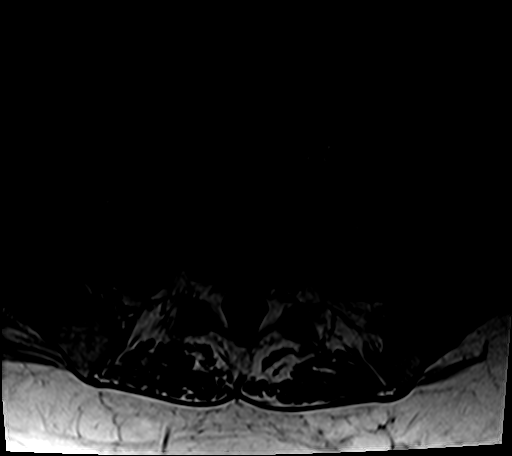
[im 5/35]
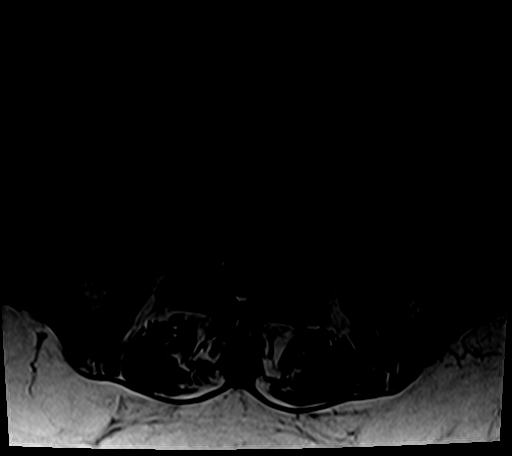
[im 10/35]
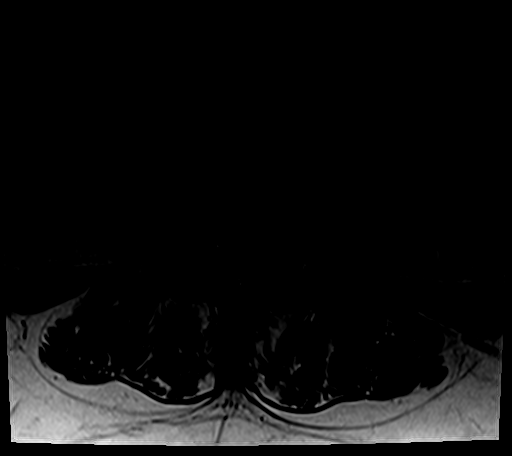
[im 18/35]
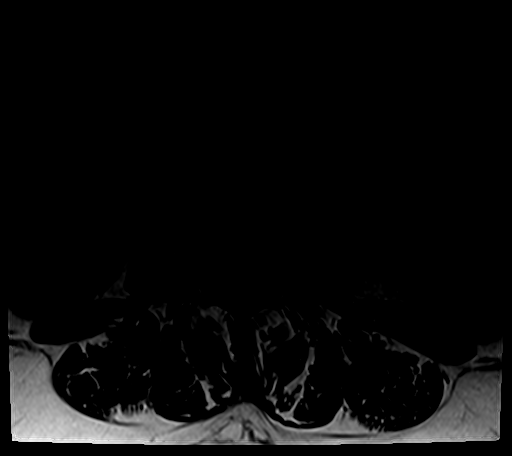
[im 30/35]
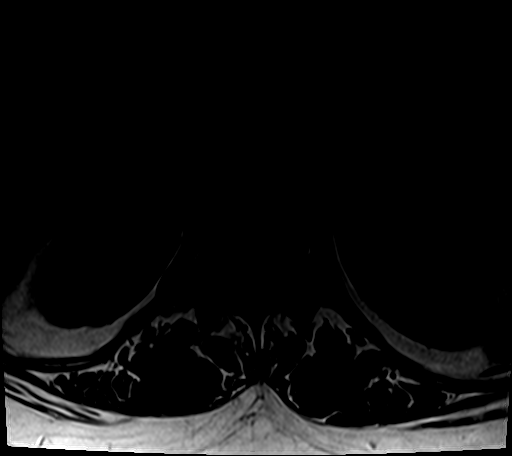

[Series 7: T2 · axial · 4.0mm · 0.70mm/px · z∈[-130,+63]mm · 9 of 35 slices shown]
[im 1/35]
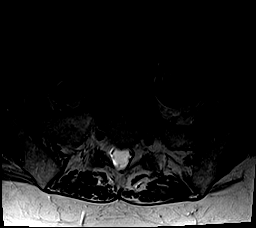
[im 5/35]
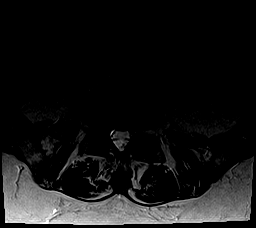
[im 10/35]
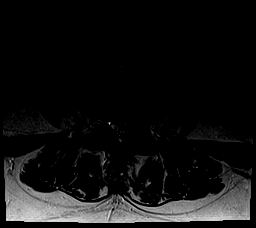
[im 15/35]
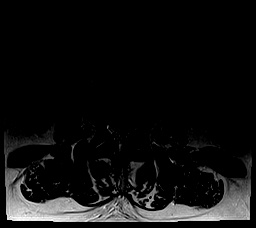
[im 18/35]
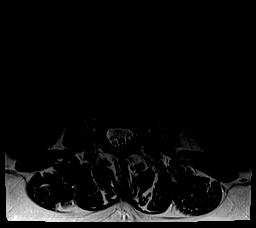
[im 20/35]
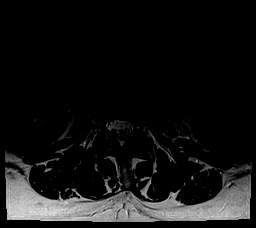
[im 25/35]
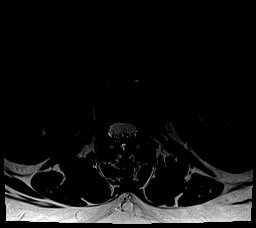
[im 30/35]
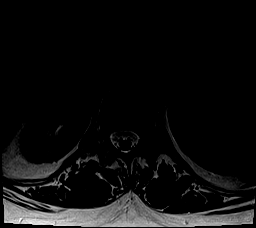
[im 35/35]
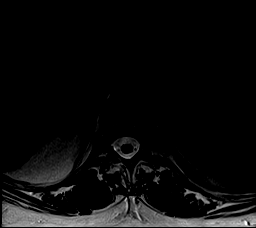

[26 of 48 positions shown; findings below may reference images not displayed]

FINDINGS: Segmentation:  Standard.

Alignment:  Physiologic.

Vertebrae: No acute fracture, evidence of discitis, or bone lesion.
Stable mild superior endplate deformity of the L3 vertebral body.

Conus medullaris and cauda equina: Conus extends to the L2 level.
Conus and cauda equina appear normal.

Paraspinal and other soft tissues: Subcentimeter T2 hyperintense
structure in right interpolar kidney, likely cyst.

Disc levels:

L1-2: No significant disc displacement, foraminal stenosis, or canal
stenosis.

L2-3: Stable mild disc bulge eccentric to the left. No significant
foraminal or canal stenosis.

L3-4: Stable mild disc bulge eccentric to the left. No significant
foraminal or canal stenosis.

L4-5: Stable mild disc bulge eccentric to the left and left
foraminal protrusion. Mild left foraminal stenosis. No canal
stenosis.

L5-S1: Stable mild disc bulge eccentric to the right and right-sided
foraminal as well as extraforaminal marginal osteophytes combined
with mild facet hypertrophy. Moderate right and mild left foraminal
stenosis. No canal stenosis.
IMPRESSION: 1. No acute osseous abnormality or malalignment. Stable mild L3
superior endplate chronic deformity.
2. Stable lumbar spondylosis greatest at the L4-5 and L5-S1 levels.
3. Mild left L4-5 as well as moderate right and mild left L5-S1
foraminal stenosis.
4. No high-grade foraminal or canal stenosis.

By: Pd Gilyard M.D.

## 2019-12-22 ENCOUNTER — Other Ambulatory Visit: Payer: Self-pay

## 2019-12-22 ENCOUNTER — Encounter (INDEPENDENT_AMBULATORY_CARE_PROVIDER_SITE_OTHER): Payer: Self-pay | Admitting: Otolaryngology

## 2019-12-22 ENCOUNTER — Ambulatory Visit (INDEPENDENT_AMBULATORY_CARE_PROVIDER_SITE_OTHER): Payer: Medicaid Other | Admitting: Otolaryngology

## 2019-12-22 VITALS — Temp 97.9°F

## 2019-12-22 DIAGNOSIS — Z8521 Personal history of malignant neoplasm of larynx: Secondary | ICD-10-CM

## 2019-12-22 NOTE — Progress Notes (Signed)
HPI: Sheri Rivera is a 59 y.o. 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  She had a cancer that initially involved the right AE fold down to the arytenoid.  This was treated with radiation therapy from September to November 2019.  She is doing well except for decreased sense of taste and mild throat symptoms but no real soreness.  She is able to swallow okay.  No hoarseness or breathing difficulty.  She had a post treatment PET scan that was okay in March..  Past Medical History:  Diagnosis Date  . Anxiety 11/11/11  . Arthritis   . Asthma   . Bursitis    right shoulder  . Chronic back pain   . Chronic neck pain   . Chronic shoulder pain   . COPD (chronic obstructive pulmonary disease) ([place])    continues to smoke 1ppd  . DDD (degenerative disc disease)   . Depression   . Fibromyalgia   . GERD (gastroesophageal reflux disease)   . Heart murmur    as a child per pt  . Herpes   . History of radiation therapy 08/12/18- 10/03/18   Larynx, prescribed dose of 63 Gy in 28 fractions. She chose to stop treatments after 27 fractions.   . IBS (irritable bowel syndrome)   . Lumbar radiculopathy   . Seasonal allergies   . Smoker   . Thyroid nodule    goiters   Past Surgical History:  Procedure Laterality Date  . CHOLECYSTECTOMY    . DIRECT LARYNGOSCOPY N/A 07/11/2018   Procedure: DIRECT LARYNGOSCOPY WITH BIOPSY;  Surgeon: Rozetta Nunnery, MD;  Location: [facility];  Service: ENT;  Laterality: N/A;  . HERNIA REPAIR     [facility]   . HIATAL HERNIA REPAIR    . TUBAL LIGATION  1983   Social History   Socioeconomic History  . Marital status: Divorced    Spouse name: Not on file  . Number of children: 3  . Years of education: Not on file  . Highest education level: Not on file  Occupational History    Employer: [employer]  Tobacco Use  . Smoking status: Current Some Day Smoker    Packs/day: 1.50    Years: 41.00    Pack years: 61.50    Types: Cigarettes    Start date: 50  . Smokeless tobacco: Never Used  . Tobacco comment: She had quit until COVID-19, She is smoking 4-5 cigarettes daily, 07/24/19  Substance and Sexual Activity  . Alcohol use: No  . Drug use: No  . Sexual activity: Not on file  Other Topics Concern  . Not on file  Social History Narrative   She lives by herself   Social Determinants of Health   Financial Resource Strain:   . Difficulty of Paying Living Expenses: Not on file  Food Insecurity:   . Worried About Charity fundraiser in the Last Year: Not on file  . Ran Out of Food in the Last Year: Not on file  Transportation Needs: Unmet Transportation Needs  . Lack of Transportation (Medical): Yes  . Lack of Transportation (Non-Medical): Not on file  Physical Activity:   . Days of Exercise per Week: Not on file  . Minutes of Exercise per Session: Not on file  Stress:   . Feeling of Stress : Not on file  Social Connections:   . Frequency of Communication with Friends and Family:  Not on file  . Frequency of Social Gatherings with Friends and Family: Not on file  . Attends Religious Services: Not on file  . Active Member of Clubs or Organizations: Not on file  . Attends Archivist Meetings: Not on file  . Marital Status: Not on file   Family History  Problem Relation Age of Onset  . Lung cancer Mother   . Arthritis Mother        Rheumatoid  . Bursitis Mother   . Asthma Mother   . Heart attack Father 76       Died of MI  . Obesity Father   . Fibromyalgia Sister   . Asthma Child    Allergies  Allergen Reactions  . Clindamycin/Lincomycin Anaphylaxis and Swelling  . Cymbalta [Duloxetine Hcl] Anaphylaxis and Itching    Lips and throat swelled; stopped breathing  . Levaquin [Levofloxacin In D5w] Anaphylaxis and Itching  . Lyrica [Pregabalin] Shortness Of Breath and Swelling    Lips and throat swelled  . Shellfish  Allergy Anaphylaxis    Has EPI-PEN  . Sulfamethoxazole Anaphylaxis  . Codeine Anxiety    Sweating,nervous   Prior to Admission medications   Medication Sig Start Date End Date Taking? Authorizing Provider  albuterol (PROVENTIL HFA;VENTOLIN HFA) 108 (90 BASE) MCG/ACT inhaler Inhale 2 puffs into the lungs every 6 (six) hours as needed for shortness of breath. Shortness of breath   Yes [provider]  albuterol (PROVENTIL) (2.5 MG/3ML) 0.083% nebulizer solution Take 3 mLs (2.5 mg total) by nebulization every 4 (four) hours as needed for wheezing or shortness of breath. Shortness of breath 08/02/16  Yes Molpus, John, MD  ibuprofen (ADVIL,MOTRIN) 600 MG tablet Take 600 mg by mouth 2 (two) times daily. 10/29/13  Yes [provider]  LORazepam (ATIVAN) 0.5 MG tablet Take 1 tab PRN extreme anxiety, not to exceed once daily. Use sparingly. Future Rx only at the discretion of the primary care provider. 11/07/18  Yes Eppie Gibson, MD  nicotine (NICODERM CQ - DOSED IN MG/24 HOURS) 21 mg/24hr patch  10/16/18  Yes [provider]  Pseudoephedrine-guaiFENesin (MUCINEX D PO) Take 600 mg by mouth. Mucinex every 12 hours daily.   Yes [provider]     Positive ROS: Otherwise negative  All other systems have been reviewed and were otherwise negative with the exception of those mentioned in the HPI and as above.  Physical Exam: Constitutional: Alert, well-appearing, no acute distress Ears: External ears without lesions or tenderness. Ear canals are clear bilaterally with intact, clear TMs.  Nasal: External nose without lesions. Septum mild deformity to the left.. Clear nasal passages Oral: Lips and gums without lesions. Tongue and palate mucosa without lesions. Posterior oropharynx clear.  Indirect laryngoscopy revealed clear base of tongue epiglottis and vocal cords. Fiberoptic laryngoscopy through the right nostril revealed a clear nasopharynx.  Base of tongue vallecula  and epiglottis were normal.  On close evaluation of the AE fold in the right arytenoid she has slight irregularity but everything is mucosal covered and no ulceration noted.  Vocal cords had normal mobility.  No vocal cord lesions noted.  Both piriform sinuses were clear. Neck: No palpable adenopathy or masses.  No palpable adenopathy in the right neck. Respiratory: Breathing comfortably  Skin: No facial/neck lesions or rash noted.  Laryngoscopy  Date/Time: 12/22/2019 5:19 PM Performed by: Rozetta Nunnery, MD Authorized by: Rozetta Nunnery, MD   Consent:    Consent obtained:  Verbal  Consent given by:  Patient   Risks discussed:  Pain Procedure details:    Indications: oncologic surveillance follow-up     Medication:  Afrin   Scope location: right nare   Sinus:    Right nasopharynx: normal   Mouth:    Oropharynx: normal     Vallecula: normal     Base of tongue: normal     Epiglottis: normal   Throat:    Right hypopharynx: normal     Left hypopharynx: normal     Pyriform sinus: normal     False vocal cords: normal     True vocal cords: normal   Comments:     Clear upper airway examination on fiberoptic laryngoscopy.  Slight irregularity along the inferior right AE fold just above the arytenoid but this all appears clear with no ulceration.  Mucosa was slightly edematous but appears intact throughout with no specific lesions or evidence of neoplasm.    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 On clinical exam today no evidence of persistent disease.  Plan: Patient will follow up in 6 months for recheck.   Radene Journey, MD

## 2020-01-25 ENCOUNTER — Ambulatory Visit (INDEPENDENT_AMBULATORY_CARE_PROVIDER_SITE_OTHER): Payer: Medicaid Other | Admitting: Otolaryngology

## 2020-01-26 DIAGNOSIS — I7300 Raynaud's syndrome without gangrene: Secondary | ICD-10-CM | POA: Diagnosis not present

## 2020-01-28 ENCOUNTER — Encounter: Payer: Self-pay | Admitting: Physician Assistant

## 2020-01-28 ENCOUNTER — Ambulatory Visit (INDEPENDENT_AMBULATORY_CARE_PROVIDER_SITE_OTHER): Payer: Medicaid Other

## 2020-01-28 ENCOUNTER — Other Ambulatory Visit: Payer: Self-pay

## 2020-01-28 ENCOUNTER — Ambulatory Visit: Payer: Self-pay

## 2020-01-28 ENCOUNTER — Ambulatory Visit: Payer: Medicaid Other | Admitting: Orthopaedic Surgery

## 2020-01-28 ENCOUNTER — Ambulatory Visit: Payer: Medicaid Other | Admitting: Physician Assistant

## 2020-01-28 DIAGNOSIS — M19012 Primary osteoarthritis, left shoulder: Secondary | ICD-10-CM

## 2020-01-28 DIAGNOSIS — M545 Low back pain: Secondary | ICD-10-CM | POA: Diagnosis not present

## 2020-01-28 DIAGNOSIS — G8929 Other chronic pain: Secondary | ICD-10-CM | POA: Diagnosis not present

## 2020-01-28 DIAGNOSIS — M19011 Primary osteoarthritis, right shoulder: Secondary | ICD-10-CM | POA: Diagnosis not present

## 2020-01-28 DIAGNOSIS — M5416 Radiculopathy, lumbar region: Secondary | ICD-10-CM

## 2020-01-28 DIAGNOSIS — M170 Bilateral primary osteoarthritis of knee: Secondary | ICD-10-CM

## 2020-01-28 DIAGNOSIS — M542 Cervicalgia: Secondary | ICD-10-CM | POA: Diagnosis not present

## 2020-01-28 MED ORDER — LIDOCAINE HCL 1 % IJ SOLN
2.0000 mL | INTRAMUSCULAR | Status: AC | PRN
  Administered 2020-01-28: 2 mL

## 2020-01-28 MED ORDER — LIDOCAINE HCL 2 % IJ SOLN
2.0000 mL | INTRAMUSCULAR | Status: AC | PRN
  Administered 2020-01-28: 2 mL

## 2020-01-28 MED ORDER — METHYLPREDNISOLONE ACETATE 40 MG/ML IJ SUSP
40.0000 mg | INTRAMUSCULAR | Status: AC | PRN
  Administered 2020-01-28: 40 mg via INTRA_ARTICULAR

## 2020-01-28 MED ORDER — METHOCARBAMOL 500 MG PO TABS: 500.0000 mg | ORAL_TABLET | Freq: Two times a day (BID) | ORAL | 2 refills | Status: DC | PRN

## 2020-01-28 MED ORDER — BUPIVACAINE HCL 0.25 % IJ SOLN
2.0000 mL | INTRAMUSCULAR | Status: AC | PRN
  Administered 2020-01-28: 2 mL via INTRA_ARTICULAR

## 2020-01-28 MED ORDER — TRAMADOL HCL 50 MG PO TABS: ORAL_TABLET | ORAL | 2 refills | Status: DC

## 2020-01-28 MED ORDER — DICLOFENAC SODIUM 75 MG PO TBEC: 75.0000 mg | DELAYED_RELEASE_TABLET | Freq: Two times a day (BID) | ORAL | 0 refills | Status: DC
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
## 2020-01-29 ENCOUNTER — Telehealth: Payer: Self-pay | Admitting: Radiology

## 2020-01-29 ENCOUNTER — Encounter (INDEPENDENT_AMBULATORY_CARE_PROVIDER_SITE_OTHER): Payer: Self-pay | Admitting: Otolaryngology

## 2020-01-29 ENCOUNTER — Ambulatory Visit (INDEPENDENT_AMBULATORY_CARE_PROVIDER_SITE_OTHER): Payer: Medicaid Other | Admitting: Otolaryngology

## 2020-01-29 VITALS — Temp 97.3°F

## 2020-01-29 DIAGNOSIS — J310 Chronic rhinitis: Secondary | ICD-10-CM

## 2020-01-29 DIAGNOSIS — H6983 Other specified disorders of Eustachian tube, bilateral: Secondary | ICD-10-CM

## 2020-01-29 NOTE — Telephone Encounter (Signed)
Oh ok, thank you!

## 2020-01-29 NOTE — Telephone Encounter (Signed)
FYI  S/w Sharyn Lull from [facility] Dr. Cleotilde Neer office. Denied referral due to past history of 3 no shows, 1 canceled appointment and 2 rescheduled appointments that patient never showed for.  Sent Neurosurgery referral to [facility] facility in [place].

## 2020-01-29 NOTE — Progress Notes (Signed)
HPI: Sheri Rivera is a 59 y.o. female who returns today for evaluation of ear complaints.  She complains mostly of ear pressure and ear discomfort right side worse than left.  She has been on several rounds of antibiotics as well as steroids.  She is interested in seeing an allergist.  She wanted tubes placed in her ears especially on the right side where she has more ear pain and discomfort. 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  At that time she was having a lot of right ear pain. She also complains of congestion of the right ear..  Past Medical History:  Diagnosis Date  . Anxiety 11/11/11  . Arthritis   . Asthma   . Bursitis    right shoulder  . Chronic back pain   . Chronic neck pain   . Chronic shoulder pain   . COPD (chronic obstructive pulmonary disease) ([place])    continues to smoke 1ppd  . DDD (degenerative disc disease)   . Depression   . Fibromyalgia   . GERD (gastroesophageal reflux disease)   . Heart murmur    as a child per pt  . Herpes   . History of radiation therapy 08/12/18- 10/03/18   Larynx, prescribed dose of 63 Gy in 28 fractions. She chose to stop treatments after 27 fractions.   . IBS (irritable bowel syndrome)   . Lumbar radiculopathy   . Seasonal allergies   . Smoker   . Thyroid nodule    goiters   Past Surgical History:  Procedure Laterality Date  . CHOLECYSTECTOMY    . DIRECT LARYNGOSCOPY N/A 07/11/2018   Procedure: DIRECT LARYNGOSCOPY WITH BIOPSY;  Surgeon: Rozetta Nunnery, MD;  Location: [facility];  Service: ENT;  Laterality: N/A;  . HERNIA REPAIR     Hiatal   . HIATAL HERNIA REPAIR    . TUBAL LIGATION  1983   Social History   Socioeconomic History  . Marital status: Divorced    Spouse name: Not on file  . Number of children: 3  . Years of education: Not on file  . Highest education level: Not on  file  Occupational History    Employer: [employer]  Tobacco Use  . Smoking status: Current Some Day Smoker    Packs/day: 1.50    Years: 41.00    Pack years: 61.50    Types: Cigarettes    Start date: 80  . Smokeless tobacco: Never Used  . Tobacco comment: She had quit until COVID-19, She is smoking 4-5 cigarettes daily, 07/24/19  Substance and Sexual Activity  . Alcohol use: No  . Drug use: No  . Sexual activity: Not on file  Other Topics Concern  . Not on file  Social History Narrative   She lives by herself   Social Determinants of Health   Financial Resource Strain:   . Difficulty of Paying Living Expenses: Not on file  Food Insecurity:   . Worried About Charity fundraiser in the Last Year: Not on file  . Ran Out of Food in the Last Year: Not on file  Transportation Needs:   . Lack of Transportation (Medical): Not on file  . Lack of Transportation (Non-Medical): Not on file  Physical Activity:   . Days of Exercise per Week: Not on file  . Minutes of Exercise per Session: Not on file  Stress:   . Feeling of Stress : Not on file  Social Connections:   . Frequency of Communication with Friends and Family: Not on file  . Frequency of Social Gatherings with Friends and Family: Not on file  . Attends Religious Services: Not on file  . Active Member of Clubs or Organizations: Not on file  . Attends Archivist Meetings: Not on file  . Marital Status: Not on file   Family History  Problem Relation Age of Onset  . Lung cancer Mother   . Arthritis Mother        Rheumatoid  . Bursitis Mother   . Asthma Mother   . Heart attack Father 14       Died of MI  . Obesity Father   . Fibromyalgia Sister   . Asthma Child    Allergies  Allergen Reactions  . Clindamycin/Lincomycin Anaphylaxis and Swelling  . Cymbalta [Duloxetine Hcl] Anaphylaxis and Itching    Lips and throat swelled; stopped breathing  . Levaquin [Levofloxacin In D5w] Anaphylaxis and Itching   . Lyrica [Pregabalin] Shortness Of Breath and Swelling    Lips and throat swelled  . 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 Sulfamethoxazole Anaphylaxis  . Codeine Anxiety    Sweating,nervous   Prior to Admission medications   Medication Sig Start Date End Date Taking? Authorizing Provider  albuterol (PROVENTIL HFA;VENTOLIN HFA) 108 (90 BASE) MCG/ACT inhaler Inhale 2 puffs into the lungs every 6 (six) hours as needed for shortness of breath. Shortness of breath    [provider]  albuterol (PROVENTIL) (2.5 MG/3ML) 0.083% nebulizer solution Take 3 mLs (2.5 mg total) by nebulization every 4 (four) hours as needed for wheezing or shortness of breath. Shortness of breath 08/02/16   Molpus, Jenny Reichmann, MD  diclofenac (VOLTAREN) 75 MG EC tablet Take 1 tablet (75 mg total) by mouth 2 (two) times daily. 01/28/20   Aundra Dubin, PA-C  ibuprofen (ADVIL,MOTRIN) 600 MG tablet Take 600 mg by mouth 2 (two) times daily. 10/29/13   [provider]  LORazepam (ATIVAN) 0.5 MG tablet Take 1 tab PRN extreme anxiety, not to exceed once daily. Use sparingly. Future Rx only at the discretion of the primary care provider. 11/07/18   Eppie Gibson, MD  methocarbamol (ROBAXIN) 500 MG tablet Take 1 tablet (500 mg total) by mouth 2 (two) times daily as needed. 01/28/20   Aundra Dubin, PA-C  nicotine (NICODERM CQ - DOSED IN MG/24 HOURS) 21 mg/24hr patch  10/16/18   [provider]  Pseudoephedrine-guaiFENesin (MUCINEX D PO) Take 600 mg by mouth. Mucinex every 12 hours daily.    [provider]  traMADol Veatrice Bourbon) 50 MG tablet Take 1-2 tabs po bid prn pain 01/28/20   Aundra Dubin, PA-C     Positive ROS: Otherwise negative  All other systems have been reviewed and were otherwise negative with the exception of those mentioned in the HPI and as above.  Physical Exam: Constitutional: Alert, well-appearing, no acute distress Ears: External ears without lesions or tenderness. Ear  canals are clear bilaterally.  The TMs are clear with no obvious middle ear fluid noted on either side.  On tuning fork testing AC was greater than BC bilaterally.  Because of her complaints of congestion in the right ear I relented to performing a right M&T in the office using phenol as a topical anesthetic.  Myringotomy was made anteriorly inferiorly in the right middle ear space was dry  however if she thought this relieved a lot of the pressure.  I placed a palpable type I tube. Nasal: External nose without lesions.  Mild rhinitis with. Clear nasal passages.  No signs of infection. Oral: Lips and gums without lesions. Tongue and palate mucosa without lesions. Posterior oropharynx clear. Neck: No palpable adenopathy or masses Respiratory: Breathing comfortably  Skin: No facial/neck lesions or rash noted.  Myringotomy  Date/Time: 01/29/2020 5:50 PM Performed by: Rozetta Nunnery, MD Authorized by: Rozetta Nunnery, MD   Consent:    Consent obtained:  Verbal   Consent given by:  Patient   Risks discussed:  Bleeding and pain   Alternatives discussed:  No treatment Pre-procedure details:    Indications: serous otitis media   Anesthesia:    Anesthesia method:  Topical application   Topical anesthetic:  Phenol Procedure Details:    Location:  Right TM   Hole made in:  Anteroinferior aspect of TM   Tube:  Paparella Type 1 Post-procedure details:    Patient tolerance of procedure:  Tolerated well, no immediate complications Comments:     Middle ear space was dry and placed a Paparella type I tube without difficulty.    Assessment: Chronic eustachian tube dysfunction with chronic rhinitis.  Plan: Recommended regular use of Nasacort 2 sprays each nostril at night.  This should help with eustachian tube dysfunction. Recommended obtaining audiogram to evaluate her hearing.  She will schedule this and bring the audiogram with her at next visit. She will follow-up in approximately  3 weeks for recheck.   Radene Journey, MD

## 2020-02-26 ENCOUNTER — Ambulatory Visit (INDEPENDENT_AMBULATORY_CARE_PROVIDER_SITE_OTHER): Payer: Medicaid Other | Admitting: Otolaryngology

## 2020-03-09 ENCOUNTER — Ambulatory Visit: Payer: Medicaid Other | Admitting: Orthopaedic Surgery

## 2020-03-18 ENCOUNTER — Telehealth: Payer: Self-pay

## 2020-03-18 NOTE — Telephone Encounter (Signed)
Sheri Rivera called today concerned about new symptoms she is having that she believes are related to her history of head and neck cancer and radiation treatment. She reports that she was recently seen by her ENT Dr. Lucia Gaskins who performed a laryngoscopy that showed scar tissue, some damage to her vocal cords but otherwise NED.   She tells me that she is experiencing trouble swallowing and periodically she feels as if her throat closes in on her. She can go days eating fine, and then she has difficulty swallowing. She reports continues taste changes and hoarseness, and thick mucous that she chokes on occasionally. She also has pain to her anterior neck under her chin where she has had previous lymphedema.  She also tells me that she is overwhelmed caring for a son with alcohol abuse and she continues to smoke despite multiple attempts to stop. She would like Dr. Isidore Moos to be aware of her symptoms.   I have notified Dr. Isidore Moos and will await further orders.   Harlow Asa RN, BSN, OCN Head & Neck Oncology Nurse [place] at [facility] Phone # [phone]  Fax # [phone]

## 2020-03-21 ENCOUNTER — Encounter: Payer: Self-pay | Admitting: *Deleted

## 2020-03-21 ENCOUNTER — Other Ambulatory Visit: Payer: Self-pay | Admitting: Radiation Oncology

## 2020-03-21 ENCOUNTER — Telehealth: Payer: Self-pay

## 2020-03-21 DIAGNOSIS — C321 Malignant neoplasm of supraglottis: Secondary | ICD-10-CM

## 2020-03-21 DIAGNOSIS — R5381 Other malaise: Secondary | ICD-10-CM

## 2020-03-21 NOTE — Telephone Encounter (Signed)
I left a voice mail with Sheri Rivera today. I explained that she would be receiving a call to schedule a CT Chest and Neck and then see Dr. Isidore Moos for follow up the next day for results. I also informed her that she would be receiving a phone call from Garald Balding, SLP regarding an appointment with him to evaluate her swallowing. I left my direct number if she had any further questions or concerns.

## 2020-03-21 NOTE — Progress Notes (Signed)
[place] Initial Psychosocial Assessment Clinical Social Work  Clinical Social Work received referral from radiation oncology.  CSW contacted by phone to assess psychosocial, emotional, mental health, and spiritual needs of the patient.   Barriers to care/review of distress screen:  - Transportation:  Do you anticipate any problems getting to appointments?  - Patient stated "I just went and bought a care that I couldn't afford, so I could get to my appointments."  CSW and patient discussed transportation resources at [facility].  Patient stated she would contact CSW if she needed additional assistance with transportation.   - Help at home:   - Patient reported multiple family stressors.  Her mother in-law was expected to pass away in the next few days.  Patient also reported her brother in [place] having health issues.  - Support system:   - Patient reports having an established therapist in the community that she has seen for the last 7 years.  Patient stated she had an emergency phone visit with her therapist this morning.   - Finances:  Are you concerned about finances.  Considering returning to work?  If not, applying for disability? - Patient expressed financial stressor of living on a fixed income, in additional to the additional financial stress caused by cancer and her family's needs.  What is your understanding of where you are with your cancer? Its cause?  Your treatment plan and what happens next? Patient described living with a fear of recurrence, and is anxious to have her PET scan completed.    Patient also expressed interest in support groups and programs.  CSW plans to follow up with patient once PET scan is complete.  Sheri Rivera, MSW, LCSW, OSW-C Clinical Social Worker [facility] [phone]

## 2020-03-25 ENCOUNTER — Telehealth: Payer: Self-pay | Admitting: *Deleted

## 2020-03-25 ENCOUNTER — Other Ambulatory Visit: Payer: Self-pay | Admitting: Radiation Oncology

## 2020-03-25 DIAGNOSIS — C321 Malignant neoplasm of supraglottis: Secondary | ICD-10-CM

## 2020-03-25 NOTE — Telephone Encounter (Signed)
CALLED PATIENT TO GIVE INFO., LVM FOR A RETURN CALL 

## 2020-03-29 ENCOUNTER — Telehealth: Payer: Self-pay | Admitting: *Deleted

## 2020-03-29 NOTE — Telephone Encounter (Signed)
RETURNED PATIENT'S PHONE CALL, LVM FOR A RETURN CALL 

## 2020-03-29 NOTE — Telephone Encounter (Signed)
RETURNED PATIENT'S PHONE CALL, SPOKE WITH PATIENT AND INFORMED OF STAT LABS ON 03-31-20 @ 10:45 AM @ [place] ON 03-31-20 - ARRIVAL TIME- 11:45 AM @ [facility], PATIENT TO HAVE WATER ONLY STARTING @ 8 AM ON 03-31-20 AND DR. SQUIRE TO GIVE RESULTS ON 04-01-20 @ 2:40 PM, PATIENT VERIFIED UNDERSTANDING ALL APPTS.

## 2020-03-30 ENCOUNTER — Telehealth: Payer: Self-pay | Admitting: *Deleted

## 2020-03-30 NOTE — Telephone Encounter (Signed)
CALLED PATIENT TO ASK ABOUT RESCHEDULING FU APPT. FOR 04-01-20, DUE TO HER CHILDREN'S GRANDMOTHER PASSING, PATIENT WOULD NOT TAKE A MORNING FU, FU APPT. RESCHEDULED FOR 04-15-20 @ 3 PM, PATIENT AGREED TO APPT. DATE AND TIME

## 2020-03-31 ENCOUNTER — Ambulatory Visit (INDEPENDENT_AMBULATORY_CARE_PROVIDER_SITE_OTHER): Payer: Medicaid Other | Admitting: Otolaryngology

## 2020-03-31 ENCOUNTER — Ambulatory Visit: Payer: Medicaid Other

## 2020-03-31 ENCOUNTER — Encounter (INDEPENDENT_AMBULATORY_CARE_PROVIDER_SITE_OTHER): Payer: Self-pay | Admitting: Otolaryngology

## 2020-03-31 ENCOUNTER — Ambulatory Visit (HOSPITAL_COMMUNITY)
Admission: RE | Admit: 2020-03-31 | Discharge: 2020-03-31 | Disposition: A | Discharge status: Home / Self Care | Payer: Medicaid Other | Source: Ambulatory Visit | Attending: Radiation Oncology | Admitting: Radiation Oncology

## 2020-03-31 ENCOUNTER — Other Ambulatory Visit: Payer: Self-pay

## 2020-03-31 VITALS — Temp 97.7°F

## 2020-03-31 DIAGNOSIS — R221 Localized swelling, mass and lump, neck: Secondary | ICD-10-CM | POA: Diagnosis not present

## 2020-03-31 DIAGNOSIS — J310 Chronic rhinitis: Secondary | ICD-10-CM

## 2020-03-31 DIAGNOSIS — Z8521 Personal history of malignant neoplasm of larynx: Secondary | ICD-10-CM

## 2020-03-31 DIAGNOSIS — C321 Malignant neoplasm of supraglottis: Secondary | ICD-10-CM | POA: Insufficient documentation

## 2020-03-31 DIAGNOSIS — H6981 Other specified disorders of Eustachian tube, right ear: Secondary | ICD-10-CM

## 2020-03-31 MED ORDER — IOHEXOL 300 MG/ML  SOLN
75.0000 mL | Freq: Once | INTRAMUSCULAR | Status: AC | PRN
  Administered 2020-03-31: 75 mL via INTRAVENOUS

## 2020-03-31 MED ORDER — SODIUM CHLORIDE (PF) 0.9 % IJ SOLN
INTRAMUSCULAR | Status: AC
  Filled 2020-03-31: qty 50

## 2020-03-31 NOTE — Progress Notes (Signed)
HPI: Sheri Rivera is a 59 y.o. female who returns today for evaluation of supraglottic cancer status post radiation therapy completed in November 2019.  I had placed a tube in her right ear because of ear discomfort and she is done much better since having the tube placed in the right ear. She complains of chronic nasal obstruction despite regular use of Nasacort or Flonase.  She complains of a lot of postnasal drainage and thick mucus in her throat.  She has had a little bit of a sore throat at times and she feels like her throat is closing up.  She does have history of allergies.. She had a CT scan of her neck performed today and I briefly reviewed this with no obvious gross disease noted on my review.  Final report is still pending. She still occasionally smokes.  Past Medical History:  Diagnosis Date  . Anxiety 11/11/11  . Arthritis   . Asthma   . Bursitis    right shoulder  . Chronic back pain   . Chronic neck pain   . Chronic shoulder pain   . COPD (chronic obstructive pulmonary disease) ([place])    continues to smoke 1ppd  . DDD (degenerative disc disease)   . Depression   . Fibromyalgia   . GERD (gastroesophageal reflux disease)   . Heart murmur    as a child per pt  . Herpes   . History of radiation therapy 08/12/18- 10/03/18   Larynx, prescribed dose of 63 Gy in 28 fractions. She chose to stop treatments after 27 fractions.   . IBS (irritable bowel syndrome)   . Lumbar radiculopathy   . Seasonal allergies   . Smoker   . Thyroid nodule    goiters   Past Surgical History:  Procedure Laterality Date  . CHOLECYSTECTOMY    . DIRECT LARYNGOSCOPY N/A 07/11/2018   Procedure: DIRECT LARYNGOSCOPY WITH BIOPSY;  Surgeon: Rozetta Nunnery, MD;  Location: [facility];  Service: ENT;  Laterality: N/A;  . HERNIA REPAIR     Hiatal   . HIATAL HERNIA REPAIR    . TUBAL LIGATION  1983   Social History   Socioeconomic History  . Marital status: Divorced    Spouse  name: Not on file  . Number of children: 3  . Years of education: Not on file  . Highest education level: Not on file  Occupational History    Employer: [employer]  Tobacco Use  . Smoking status: Current Some Day Smoker    Packs/day: 1.50    Years: 41.00    Pack years: 61.50    Types: Cigarettes    Start date: 85  . Smokeless tobacco: Never Used  . Tobacco comment: She had quit until COVID-19, She is smoking 4-5 cigarettes daily, 07/24/19  Substance and Sexual Activity  . Alcohol use: No  . Drug use: No  . Sexual activity: Not on file  Other Topics Concern  . Not on file  Social History Narrative   She lives by herself   Social Determinants of Health   Financial Resource Strain:   . Difficulty of Paying Living Expenses:   Food Insecurity:   . Worried About Charity fundraiser in the Last Year:   . Arboriculturist in the Last Year:   Transportation Needs:   . Film/video editor (Medical):   Marland Kitchen Lack of Transportation (Non-Medical):   Physical Activity:   . Days of Exercise per Week:   .  Minutes of Exercise per Session:   Stress:   . Feeling of Stress :   Social Connections:   . Frequency of Communication with Friends and Family:   . Frequency of Social Gatherings with Friends and Family:   . Attends Religious Services:   . Active Member of Clubs or Organizations:   . Attends Archivist Meetings:   Marland Kitchen Marital Status:    Family History  Problem Relation Age of Onset  . Lung cancer Mother   . Arthritis Mother        Rheumatoid  . Bursitis Mother   . Asthma Mother   . Heart attack Father 95       Died of MI  . Obesity Father   . Fibromyalgia Sister   . Asthma Child    Allergies  Allergen Reactions  . Clindamycin/Lincomycin Anaphylaxis and Swelling  . Cymbalta [Duloxetine Hcl] Anaphylaxis and Itching    Lips and throat swelled; stopped breathing  . Levaquin [Levofloxacin In D5w] Anaphylaxis and Itching  . Lyrica [Pregabalin] Shortness Of  Breath and Swelling    Lips and throat swelled  . 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 Sulfamethoxazole Anaphylaxis  . Codeine Anxiety    Sweating,nervous   Prior to Admission medications   Medication Sig Start Date End Date Taking? Authorizing Provider  albuterol (PROVENTIL HFA;VENTOLIN HFA) 108 (90 BASE) MCG/ACT inhaler Inhale 2 puffs into the lungs every 6 (six) hours as needed for shortness of breath. Shortness of breath   Yes [provider]  albuterol (PROVENTIL) (2.5 MG/3ML) 0.083% nebulizer solution Take 3 mLs (2.5 mg total) by nebulization every 4 (four) hours as needed for wheezing or shortness of breath. Shortness of breath 08/02/16  Yes Molpus, John, MD  diclofenac (VOLTAREN) 75 MG EC tablet Take 1 tablet (75 mg total) by mouth 2 (two) times daily. 01/28/20  Yes Aundra Dubin, PA-C  ibuprofen (ADVIL,MOTRIN) 600 MG tablet Take 600 mg by mouth 2 (two) times daily. 10/29/13  Yes [provider]  LORazepam (ATIVAN) 0.5 MG tablet Take 1 tab PRN extreme anxiety, not to exceed once daily. Use sparingly. Future Rx only at the discretion of the primary care provider. 11/07/18  Yes Eppie Gibson, MD  methocarbamol (ROBAXIN) 500 MG tablet Take 1 tablet (500 mg total) by mouth 2 (two) times daily as needed. 01/28/20  Yes Aundra Dubin, PA-C  nicotine (NICODERM CQ - DOSED IN MG/24 HOURS) 21 mg/24hr patch  10/16/18  Yes [provider]  Pseudoephedrine-guaiFENesin (MUCINEX D PO) Take 600 mg by mouth. Mucinex every 12 hours daily.   Yes [provider]  traMADol Veatrice Bourbon) 50 MG tablet Take 1-2 tabs po bid prn pain 01/28/20  Yes Dwana Melena L, PA-C     Positive ROS: Otherwise negative  All other systems have been reviewed and were otherwise negative with the exception of those mentioned in the HPI and as above.  Physical Exam: Constitutional: Alert, well-appearing, no acute distress.  She is having no airway problems in the office today although  she complains of nasal congestion. Ears: External ears without lesions or tenderness. Ear canals are clear bilaterally.  Left TM is clear right TM reveals Paparella type I tube in place that is starting to extrude. Nasal: External nose without lesions. Septum is midline with mild rhinitis..  On anterior rhinoscopy nasal passages are clear bilaterally although she does have a lot of thick clear mucus in both nasal cavities.  Review of the  CT scan she had performed today demonstrated clear paranasal sinuses. Oral: Lips and gums without lesions. Tongue and palate mucosa without lesions. Posterior oropharynx clear. Fiberoptic laryngoscopy was performed to the right nostril.  Nasopharynx was clear.  Base of tongue vallecula and epiglottis were normal with no vocal cord lesions noted.  She had moderate swelling of the AE folds and arytenoids but no mucosal lesions noted no evidence of persistent cancer.  She has some slight nodular changes along the right AE fold.  But no evidence of gross disease. Neck: No palpable adenopathy or masses.  No palpable adenopathy in the neck. Respiratory: Breathing comfortably  Skin: No facial/neck lesions or rash noted.  Laryngoscopy  Date/Time: 03/31/2020 4:47 PM Performed by: Rozetta Nunnery, MD Authorized by: Rozetta Nunnery, MD   Consent:    Consent obtained:  Verbal   Consent given by:  Patient Procedure details:    Indications: oncologic surveillance follow-up     Medication:  Afrin   Scope location: right nare   Mouth:    Oropharynx: normal     Vallecula: normal     Base of tongue: normal     Epiglottis: normal   Throat:    Pyriform sinus: normal     True vocal cords: normal   Comments:     On fiberoptic laryngoscopy she had moderate supraglottic edema and some mucosal nodular changes on the right AE fold.  But no evidence of recurrent cancer clinically.    Assessment: Chronic rhinitis with no evidence of active infection. Throat  symptoms probably related to post radiation changes and dry mouth. Right eustachian tube dysfunction History of supraglottic cancer treated with radiation therapy completed in November 2019.  No clinical evidence of recurrent disease.  Plan: I prescribed Evoxac 30 mg twice daily to see if this helps at all. She has tried Biotene without much benefit.  I also suggested trying XyliMelts. For the nasal congestion recommended continue use of the Nasacort 2 sprays each nostril at night as well as use of saline irrigation during the day or use of the Nettie pot and gave her a sample Nettie pot. She will follow-up in 6 months for recheck.   Radene Journey, MD

## 2020-04-01 ENCOUNTER — Ambulatory Visit: Payer: Medicaid Other | Admitting: Radiation Oncology

## 2020-04-06 ENCOUNTER — Telehealth: Payer: Self-pay | Admitting: Orthopaedic Surgery

## 2020-04-06 NOTE — Telephone Encounter (Signed)
Pt called stating she was supposed to go for a nerve conduction test but it was in [place] and she doesn't have her own transportation so she needs something in [place].   [phone]

## 2020-04-06 NOTE — Telephone Encounter (Signed)
Did we order the NCS?  I don't see that we did.

## 2020-04-07 ENCOUNTER — Other Ambulatory Visit: Payer: Self-pay

## 2020-04-07 DIAGNOSIS — G8929 Other chronic pain: Secondary | ICD-10-CM

## 2020-04-07 DIAGNOSIS — Z124 Encounter for screening for malignant neoplasm of cervix: Secondary | ICD-10-CM | POA: Diagnosis not present

## 2020-04-07 DIAGNOSIS — N390 Urinary tract infection, site not specified: Secondary | ICD-10-CM | POA: Diagnosis not present

## 2020-04-07 NOTE — Telephone Encounter (Signed)
Order made

## 2020-04-07 NOTE — Telephone Encounter (Signed)
Lumbar radic

## 2020-04-07 NOTE — Telephone Encounter (Signed)
I don't see order. I can put order in. What are we looking at for NCS/EMG?

## 2020-04-07 NOTE — Telephone Encounter (Signed)
Called patient no answer LMOM. Someone will call her to schedule.

## 2020-04-11 ENCOUNTER — Telehealth: Payer: Self-pay | Admitting: *Deleted

## 2020-04-11 NOTE — Telephone Encounter (Signed)
CALLED PATIENT TO INFORM FU APPT. HAS BEEN MOVED TO 1:40 PM ON 04-15-20, PRE PROVIDER REQUEST

## 2020-04-11 NOTE — Telephone Encounter (Signed)
RETURNED PATIENT'S PHONE CALL, LVM FOR A RETURN CALL 

## 2020-04-14 ENCOUNTER — Telehealth: Payer: Self-pay | Admitting: *Deleted

## 2020-04-14 NOTE — Telephone Encounter (Signed)
CALLED PATIENT TO INFORM THAT FU APPT. HAS BEEN MOVED TO 1:40 PM ON 04-15-20, LVM FOR A RETURN CALL

## 2020-04-14 NOTE — Progress Notes (Signed)
Sheri Rivera presents for follow up of radiation completed 10/03/2018 to her larynx and bilateral neck and to review recent CT scan results.  Pain issues, if any: Chronic back pain Using a feeding tube?: N/A Weight changes, if any:  Wt Readings from Last 3 Encounters:  04/15/20 165 lb 6.4 oz (75 kg)  07/24/19 142 lb 12.8 oz (64.8 kg)  02/06/19 154 lb 3.2 oz (69.9 kg)   Swallowing issues, if any: Food occasionally gets stuck in throat related to mucus build up in throat (she reports Dr. Lucia Gaskins suctioned out her throat and there was an undissolved antibiotic in the sputum) Smoking or chewing tobacco? 8-9 cigarettes a day Using fluoride trays daily?  N/A Last ENT visit was on: 03/31/2020 saw Dr. [name]: "On fiberoptic laryngoscopy she had moderate supraglottic edema and some mucosal nodular changes on the right AE fold.  But no evidence of recurrent cancer clinically. Chronic rhinitis with no evidence of active infection. Throat symptoms probably related to post radiation changes and dry mouth. Right eustachian tube dysfunction. She will follow-up in 6 months for recheck." Other notable issues, if any: Patient states she is having a nerve conduction test soon to help identify why her hands keep going numb. She also mentions that she's having a hysterectomy done in June of this year.  Vitals:   04/15/20 1356  BP: 117/75  Pulse: 74  Resp: (!) 24  Temp: 97.9 F (36.6 C)  SpO2: 94%

## 2020-04-15 ENCOUNTER — Ambulatory Visit
Admission: RE | Admit: 2020-04-15 | Discharge: 2020-04-15 | Disposition: A | Discharge status: Home / Self Care | Payer: Medicaid Other | Source: Ambulatory Visit | Attending: Radiation Oncology | Admitting: Radiation Oncology

## 2020-04-15 ENCOUNTER — Other Ambulatory Visit: Payer: Self-pay

## 2020-04-15 ENCOUNTER — Encounter: Payer: Self-pay | Admitting: Radiation Oncology

## 2020-04-15 VITALS — BP 117/75 | HR 74 | Temp 97.9°F | Resp 24 | Ht 63.0 in | Wt 165.4 lb

## 2020-04-15 DIAGNOSIS — Z8521 Personal history of malignant neoplasm of larynx: Secondary | ICD-10-CM | POA: Insufficient documentation

## 2020-04-15 DIAGNOSIS — R1310 Dysphagia, unspecified: Secondary | ICD-10-CM | POA: Insufficient documentation

## 2020-04-15 DIAGNOSIS — C321 Malignant neoplasm of supraglottis: Secondary | ICD-10-CM

## 2020-04-15 DIAGNOSIS — R5381 Other malaise: Secondary | ICD-10-CM

## 2020-04-15 LAB — T4, FREE: Free T4: 0.82 ng/dL (ref 0.61–1.12)

## 2020-04-15 NOTE — Progress Notes (Signed)
Radiation Oncology         [phone] ________________________________  Name: Sheri Rivera MRN: [id number]  Date: 04/15/2020  DOB: [birth year]  Follow-Up Visit Note  CC: Sheri Drafts, FNP  Sheri Rivera, *  Diagnosis and Prior Radiotherapy:       ICD-10-CM   1. 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  CHIEF COMPLAINT:  Here for follow-up and surveillance of laryngeal cancer  Narrative:    Sheri Rivera presents for follow up of radiation completed 10/03/2018 to her larynx and bilateral neck and to review recent CT scan results.  Recently, she desired surveillance scans out of concern for recurrence.  She has had a lot of anxiety about this.  Unfortunately insurance would not approve imaging of her chest.  However she did undergo a CT of her neck which is negative for progressive or recurrent disease.  The patient is concerned about potential disease in her lungs that she has been sensing some discomfort in the bases of her lungs.  Pain issues, if any: Chronic back pain Using a feeding tube?: N/A Weight changes, if any:  Wt Readings from Last 3 Encounters:  04/15/20 165 lb 6.4 oz (75 kg)  07/24/19 142 lb 12.8 oz (64.8 kg)  02/06/19 154 lb 3.2 oz (69.9 kg)   Swallowing issues, if any: Food occasionally gets stuck in throat related to mucus build up in throat (she reports Sheri Rivera suctioned out her throat and there was an undissolved antibiotic in the sputum) Smoking or chewing tobacco? 8-9 cigarettes a day AGAINST MEDICAL ADVICE Using fluoride trays daily?  N/A Last ENT visit was on: 03/31/2020 saw Dr. [name]: "On fiberoptic laryngoscopy she had moderate supraglottic edema and some mucosal nodular changes on the right AE fold.  But no evidence of recurrent cancer clinically. Chronic rhinitis with no evidence of active infection.  Throat symptoms probably related to post radiation changes and dry mouth. Right eustachian tube dysfunction. She will follow-up in 6 months for recheck." Other notable issues, if any: Patient states she is having a nerve conduction test soon to help identify why her hands keep going numb. She also mentions that she's having a hysterectomy done in June of this year.  Vitals:   04/15/20 1356  BP: 117/75  Pulse: 74  Resp: (!) 24  Temp: 97.9 F (36.6 C)  SpO2: 94%    ALLERGIES:  is allergic to clindamycin/lincomycin; cymbalta [duloxetine hcl]; levaquin [levofloxacin in d5w]; lyrica [pregabalin]; shellfish allergy; sulfamethoxazole; and codeine.  Meds: Current Outpatient Medications  Medication Sig Dispense Refill  . albuterol (PROVENTIL HFA;VENTOLIN HFA) 108 (90 BASE) MCG/ACT inhaler Inhale 2 puffs into the lungs every 6 (six) hours as needed for shortness of breath. Shortness of breath    . albuterol (PROVENTIL) (2.5 MG/3ML) 0.083% nebulizer solution Take 3 mLs (2.5 mg total) by nebulization every 4 (four) hours as needed for wheezing or shortness of breath. Shortness of breath 25 vial 0  . diclofenac (VOLTAREN) 75 MG EC tablet Take 1 tablet (75 mg total) by mouth 2 (two) times daily. 60 tablet 0  . ibuprofen (ADVIL,MOTRIN) 600 MG tablet Take 600 mg by mouth 2 (two) times daily.    Marland Kitchen LORazepam (ATIVAN) 0.5 MG tablet Take 1 tab PRN extreme anxiety, not to exceed once daily. Use sparingly. Future Rx only at the discretion of the primary care  provider. 20 tablet 0  . methocarbamol (ROBAXIN) 500 MG tablet Take 1 tablet (500 mg total) by mouth 2 (two) times daily as needed. 30 tablet 2  . nicotine (NICODERM CQ - DOSED IN MG/24 HOURS) 21 mg/24hr patch     . Pseudoephedrine-guaiFENesin (MUCINEX D PO) Take 600 mg by mouth. Mucinex every 12 hours daily.    . traMADol (ULTRAM) 50 MG tablet Take 1-2 tabs po bid prn pain 30 tablet 2   No current facility-administered medications for this encounter.     Physical Findings: The patient is in no acute distress. Patient is alert and oriented. Wt Readings from Last 3 Encounters:  04/15/20 165 lb 6.4 oz (75 kg)  07/24/19 142 lb 12.8 oz (64.8 kg)  02/06/19 154 lb 3.2 oz (69.9 kg)    height is 5\' 3"  (1.6 m) and weight is 165 lb 6.4 oz (75 kg). Her temperature is 97.9 F (36.6 C). Her blood pressure is 117/75 and her pulse is 74. Her respiration is 24 (abnormal) and oxygen saturation is 94%. .     General: Alert and oriented, in no acute distress-voice is less hoarse than usual HEENT: Head is normocephalic. Extraocular movements are intact. Upper oropharynx and oral cavity are clear.  Mucous membranes are relatively dry Neck: Mild  edema in anterior neck.  No adenopathy appreciated .   Lymphatics: see Neck Exam Neurologic: Cranial nerves II through XII are grossly intact.  No obvious focalities. Speech is fluent. Coordination is intact. Chest: No rhonchi wheezes or rales Heart is regular in rate and rhythm with no murmurs Psychiatric: Judgment and insight are intact. Affect is appropriate.      Lab Findings: Lab Results  Component Value Date   WBC 6.9 01/08/2019   HGB 15.2 (H) 01/08/2019   HCT 45.9 01/08/2019   MCV 96.4 01/08/2019   PLT 270 01/08/2019    Lab Results  Component Value Date   TSH 3.849 04/15/2020    Radiographic Findings: CT Soft Tissue Neck W Contrast  Result Date: 03/31/2020 CLINICAL DATA:  Provided history: Neoplasm head/neck, Rx monitor or follow-up restaging, fullness in right cervical neck. EXAM: CT NECK WITH CONTRAST TECHNIQUE: Multidetector CT imaging of the neck was performed using the standard protocol following the bolus administration of intravenous contrast. CONTRAST:  52mL OMNIPAQUE IOHEXOL 300 MG/ML  SOLN COMPARISON:  Neck CT 06/29/2018, brain MRI 01/02/2013, PET-CT 02/05/2019 FINDINGS: Pharynx and larynx: There is generalized soft tissue prominence within the hypopharynx and supraglottic larynx  consistent with post radiation changes. No discrete residual/recurrent mass is identified along the right aryepiglottic fold. No discrete mass is identified elsewhere within the oral cavity, pharynx or larynx. Salivary glands: There is asymmetric prominence and subtle hyperenhancement of the right submandibular gland suggestive sialoadenitis (series 3, image 53). No radiopaque sialolith is identified. The bilateral parotid and left submandibular glands are unremarkable. Thyroid: Subcentimeter left thyroid lobe nodule not meeting consensus criteria for ultrasound follow-up. Lymph nodes: No pathologically enlarged cervical chain lymph nodes identified. Vascular: The major vascular structures of the neck are patent. Mild calcified plaque within the right carotid bifurcation. Limited intracranial: No abnormality identified. Visualized orbits: Unchanged 6 mm low-density circumscribed lesion within the intraconal right orbit which may reflect a cyst or hemangioma. Mastoids and visualized paranasal sinuses: No significant paranasal sinus disease or mastoid effusion. Skeleton: No acute bony abnormality or aggressive osseous lesion. Upper chest: Redemonstrated scarring and emphysema. Other: Post radiation changes to the neck. IMPRESSION: Post radiation changes of the hypopharynx and  supraglottic larynx. No discrete residual/recurrent mass is identified along the right aryepiglottic fold. No pathologically enlarged cervical chain lymph nodes. Asymmetric prominence of the right submandibular gland with associated hyperenhancement. Findings are suggestive of sialoadenitis. No radiopaque sialolith is demonstrated. Electronically Signed   By: Kellie Simmering DO   On: 03/31/2020 17:10    Impression/Plan:    1) Head and Neck Cancer Status: No evidence of disease  2) Nutritional Status: Stable  3) Risk Factors: The patient has been educated about risk factors including alcohol and tobacco abuse; they understand that avoidance  of alcohol and tobacco is important to prevent recurrences as well as other cancers: Smoking again, AMA.  She understands the risks of this.   4) Swallowing:   The patient reports dysphagia.  She has not adhered to recommendations by speech-language pathology.  She has not attended appointments as scheduled.  We ordered a modified barium swallow recently but she declined to schedule this when called by the schedulers.  She has their phone number and today we recommended that she call them back to schedule this study so that her dysphagia can be worked up.  5) Dental: Encouraged to continue regular followup with dentistry, and dental hygiene including fluoride rinses.  Reiterated over-the-counter options to address dry mouth.  6) Thyroid function: Within normal limits Lab Results  Component Value Date   TSH 3.849 04/15/2020   7)   Follow-up in 1 year.  (May 2022).  We will work on Government social research officer for her CT chest which was unfortunately denied.  Follow-up as scheduled with otolaryngology.  We discussed measures to reduce the risk of infection during the COVID-19 pandemic.   On date of service, in total, I spent 25 minutes on this encounter.  The patient was seen in person.  ________________________________   Eppie Gibson, MD

## 2020-04-18 DIAGNOSIS — Z03818 Encounter for observation for suspected exposure to other biological agents ruled out: Secondary | ICD-10-CM | POA: Diagnosis not present

## 2020-04-18 DIAGNOSIS — Z20822 Contact with and (suspected) exposure to covid-19: Secondary | ICD-10-CM | POA: Diagnosis not present

## 2020-04-18 LAB — TSH: TSH: 3.849 u[IU]/mL (ref 0.308–3.960)

## 2020-04-19 ENCOUNTER — Encounter: Payer: Self-pay | Admitting: Radiation Oncology

## 2020-04-29 NOTE — Progress Notes (Signed)
Oncology Nurse Navigator Documentation  Dr. Isidore Moos has ordered for Sheri Rivera to have a swallowing study performed (MBSS). I called Ms. Monia Pouch and left a voice mail to get her scheduled for this study and afterwards an appointment with Harvel Quale SLP. I left my direct contact number and requested a call back to get her scheduled.   Harlow Asa RN, BSN, OCN Head & Neck Oncology Nurse [place] at [facility] Phone # [phone]  Fax # [phone]

## 2020-05-09 NOTE — Progress Notes (Signed)
Oncology Nurse Navigator Documentation  I called Ms. Zappia today to again request a call back from her to schedule a MBSS ordered by Dr. Isidore Moos and to see Garald Balding SLP based on those results. I left a message asking for a return call and verbalized that she would need to call me back if she desired to get the MBSS scheduled. I left my direct contact information.  Harlow Asa RN, BSN, OCN Head & Neck Oncology Nurse [place] at [facility] Phone # [phone]  Fax # [phone]

## 2020-05-17 ENCOUNTER — Telehealth: Payer: Self-pay | Admitting: *Deleted

## 2020-05-17 NOTE — Telephone Encounter (Signed)
CALLED PATIENT TO ASK WHEN SHE WOULD BE WILLING TO DO A SCAN, LVM FOR A RETURN CALL

## 2020-05-18 ENCOUNTER — Telehealth: Payer: Self-pay | Admitting: *Deleted

## 2020-05-18 NOTE — Telephone Encounter (Signed)
CALLED PATIENT TO ASK ABOUT WHEN SHE WOULD BE WILLING TO DO A SCAN, LVM FOR A RETURN CALL

## 2020-05-19 ENCOUNTER — Telehealth: Payer: Self-pay | Admitting: *Deleted

## 2020-05-19 NOTE — Telephone Encounter (Signed)
CALLED PATIENT TO INFORM OF STAT LABS ON 06-01-20 @ 12:15 PM @ [place] AND HER CT ON 06-01-20 - ARRIVAL TIME- 1:15 PM @ [facility], PATIENT TO BE NPO- 4 HRS. PRIOR TO TEST, PATIENT TO RECEIVE RESULTS FROM DR. SQUIRE ON 06-03-20 @ 2 PM, LVM FOR A RETURN CALL

## 2020-06-01 ENCOUNTER — Ambulatory Visit (HOSPITAL_COMMUNITY): Payer: Medicaid Other

## 2020-06-01 ENCOUNTER — Encounter (HOSPITAL_COMMUNITY): Payer: Self-pay

## 2020-06-01 ENCOUNTER — Ambulatory Visit: Payer: Medicaid Other | Attending: Radiation Oncology

## 2020-06-01 DIAGNOSIS — Z8521 Personal history of malignant neoplasm of larynx: Secondary | ICD-10-CM | POA: Insufficient documentation

## 2020-06-02 ENCOUNTER — Telehealth: Payer: Self-pay | Admitting: *Deleted

## 2020-06-02 NOTE — Telephone Encounter (Signed)
CALLED PATIENT TO ASK ABOUT MISSING SCAN, LVM FOR A RETURN CALL SO THAT I CAN RESCHEDULED MISSED SCAN AND FU

## 2020-06-03 ENCOUNTER — Telehealth: Payer: Self-pay | Admitting: *Deleted

## 2020-06-03 ENCOUNTER — Ambulatory Visit: Payer: Medicaid Other | Admitting: Radiation Oncology

## 2020-06-03 NOTE — Telephone Encounter (Signed)
CALLED PATIENT TO REMIND NOT COME TODAY , DUE TO NOT HAVING SCAN, I ASKED HER TO CALL ME SO WE CAN RESCHEDULE SCAN AND FU

## 2020-06-07 ENCOUNTER — Encounter: Payer: Self-pay | Admitting: Neurology

## 2020-06-07 ENCOUNTER — Ambulatory Visit (INDEPENDENT_AMBULATORY_CARE_PROVIDER_SITE_OTHER): Payer: Medicaid Other | Admitting: Otolaryngology

## 2020-06-07 ENCOUNTER — Other Ambulatory Visit: Payer: Self-pay

## 2020-06-07 ENCOUNTER — Telehealth: Payer: Self-pay | Admitting: *Deleted

## 2020-06-07 DIAGNOSIS — R202 Paresthesia of skin: Secondary | ICD-10-CM

## 2020-06-07 NOTE — Telephone Encounter (Signed)
Called patient to ask when she would be willing to have her CT, lvm for a return call

## 2020-06-08 ENCOUNTER — Telehealth: Payer: Self-pay | Admitting: *Deleted

## 2020-06-08 NOTE — Telephone Encounter (Signed)
Called patient to ask about rescheduling CT, lvm for a return call

## 2020-06-09 ENCOUNTER — Telehealth: Payer: Self-pay | Admitting: *Deleted

## 2020-06-09 NOTE — Telephone Encounter (Signed)
Called patient to inform of CT for 06-17-20- arrival time- 11:15 am @ [facility], patient to have water only - 4 hrs. prior to test, spoke with patient and she is aware of this test

## 2020-06-17 ENCOUNTER — Ambulatory Visit (HOSPITAL_COMMUNITY): Payer: Medicaid Other

## 2020-06-24 ENCOUNTER — Ambulatory Visit (HOSPITAL_COMMUNITY): Admission: RE | Admit: 2020-06-24 | Payer: Medicaid Other | Source: Ambulatory Visit

## 2020-06-27 ENCOUNTER — Ambulatory Visit (INDEPENDENT_AMBULATORY_CARE_PROVIDER_SITE_OTHER): Payer: Medicaid Other | Admitting: Otolaryngology

## 2020-06-27 ENCOUNTER — Other Ambulatory Visit: Payer: Self-pay

## 2020-06-27 DIAGNOSIS — Z8521 Personal history of malignant neoplasm of larynx: Secondary | ICD-10-CM

## 2020-06-27 DIAGNOSIS — J310 Chronic rhinitis: Secondary | ICD-10-CM

## 2020-06-27 NOTE — Progress Notes (Signed)
HPI: Sheri Rivera is a 59 y.o. female who returns today for evaluation of right sided supraglottic cancer treated with radiation chemotherapy that she completed in November 2019.  She had a tube placed in her right ear because of right ear discomfort and she has had no further ear discomfort since the tube was placed.  She is doing reasonably well today but complains of intermittent nasal obstruction.  She has used Flonase that does not seem to help and does not use it regularly.  She occasionally uses Afrin once or twice a week because of nasal obstruction.  She denies any sore throat although she complains of chronic thick mucus in her throat.  Past Medical History:  Diagnosis Date  . Anxiety 11/11/11  . Arthritis   . Asthma   . Bursitis    right shoulder  . Chronic back pain   . Chronic neck pain   . Chronic shoulder pain   . COPD (chronic obstructive pulmonary disease) ([place])    continues to smoke 1ppd  . DDD (degenerative disc disease)   . Depression   . Fibromyalgia   . GERD (gastroesophageal reflux disease)   . Heart murmur    as a child per pt  . Herpes   . History of radiation therapy 08/12/18- 10/03/18   Larynx, prescribed dose of 63 Gy in 28 fractions. She chose to stop treatments after 27 fractions.   . IBS (irritable bowel syndrome)   . Lumbar radiculopathy   . Seasonal allergies   . Smoker   . Thyroid nodule    goiters   Past Surgical History:  Procedure Laterality Date  . CHOLECYSTECTOMY    . DIRECT LARYNGOSCOPY N/A 07/11/2018   Procedure: DIRECT LARYNGOSCOPY WITH BIOPSY;  Surgeon: Rozetta Nunnery, MD;  Location: [place];  Service: ENT;  Laterality: N/A;  . HERNIA REPAIR     [place]   . HIATAL HERNIA REPAIR    . TUBAL LIGATION  1983   Social History   Socioeconomic History  . Marital status: Divorced    Spouse name: Not on file  . Number of children: 3  . Years of education: Not on file  . Highest education level: Not on file   Occupational History    Employer: [employer]  Tobacco Use  . Smoking status: Current Some Day Smoker    Packs/day: 1.50    Years: 41.00    Pack years: 61.50    Types: Cigarettes    Start date: 55  . Smokeless tobacco: Never Used  . Tobacco comment: She had quit until COVID-19, She is smoking 4-5 cigarettes daily, 07/24/19  Vaping Use  . Vaping Use: Never used  Substance and Sexual Activity  . Alcohol use: No  . Drug use: No  . Sexual activity: Not on file  Other Topics Concern  . Not on file  Social History Narrative   She lives by herself   Social Determinants of Health   Financial Resource Strain:   . Difficulty of Paying Living Expenses:   Food Insecurity:   . Worried About Charity fundraiser in the Last Year:   . Arboriculturist in the Last Year:   Transportation Needs:   . Film/video editor (Medical):   Marland Kitchen Lack of Transportation (Non-Medical):   Physical Activity:   . Days of Exercise per Week:   . Minutes of Exercise per Session:   Stress:   . Feeling of Stress :  Social Connections:   . Frequency of Communication with Friends and Family:   . Frequency of Social Gatherings with Friends and Family:   . Attends Religious Services:   . Active Member of Clubs or Organizations:   . Attends Archivist Meetings:   Marland Kitchen Marital Status:    Family History  Problem Relation Age of Onset  . Lung cancer Mother   . Arthritis Mother        Rheumatoid  . Bursitis Mother   . Asthma Mother   . Heart attack Father 36       Died of MI  . Obesity Father   . Fibromyalgia Sister   . Asthma Child    Allergies  Allergen Reactions  . Clindamycin/Lincomycin Anaphylaxis and Swelling  . Cymbalta [Duloxetine Hcl] Anaphylaxis and Itching    Lips and throat swelled; stopped breathing  . Levaquin [Levofloxacin In D5w] Anaphylaxis and Itching  . Lyrica [Pregabalin] Shortness Of Breath and Swelling    Lips and throat swelled  . Shellfish Allergy Anaphylaxis     Has EPI-PEN  . Sulfamethoxazole Anaphylaxis  . Codeine Anxiety    Sweating,nervous   Prior to Admission medications   Medication Sig Start Date End Date Taking? Authorizing Provider  albuterol (PROVENTIL HFA;VENTOLIN HFA) 108 (90 BASE) MCG/ACT inhaler Inhale 2 puffs into the lungs every 6 (six) hours as needed for shortness of breath. Shortness of breath    [provider]  albuterol (PROVENTIL) (2.5 MG/3ML) 0.083% nebulizer solution Take 3 mLs (2.5 mg total) by nebulization every 4 (four) hours as needed for wheezing or shortness of breath. Shortness of breath 08/02/16   Molpus, Jenny Reichmann, MD  diclofenac (VOLTAREN) 75 MG EC tablet Take 1 tablet (75 mg total) by mouth 2 (two) times daily. 01/28/20   Aundra Dubin, PA-C  ibuprofen (ADVIL,MOTRIN) 600 MG tablet Take 600 mg by mouth 2 (two) times daily. 10/29/13   [provider]  LORazepam (ATIVAN) 0.5 MG tablet Take 1 tab PRN extreme anxiety, not to exceed once daily. Use sparingly. Future Rx only at the discretion of the primary care provider. 11/07/18   Eppie Gibson, MD  methocarbamol (ROBAXIN) 500 MG tablet Take 1 tablet (500 mg total) by mouth 2 (two) times daily as needed. 01/28/20   Aundra Dubin, PA-C  nicotine (NICODERM CQ - DOSED IN MG/24 HOURS) 21 mg/24hr patch  10/16/18   [provider]  Pseudoephedrine-guaiFENesin (MUCINEX D PO) Take 600 mg by mouth. Mucinex every 12 hours daily.    [provider]  traMADol Veatrice Bourbon) 50 MG tablet Take 1-2 tabs po bid prn pain 01/28/20   Aundra Dubin, PA-C     Positive ROS: Otherwise negative  All other systems have been reviewed and were otherwise negative with the exception of those mentioned in the HPI and as above.  Physical Exam: Constitutional: Alert, well-appearing, no acute distress Ears: External ears without lesions or tenderness.  She has a tube in the right ear that is partially extruded with some wax.  The TM is clear otherwise. Nasal: External  nose without lesions. Septum slightly deviated to the right with a moderate rhinitis.  No signs of infection.. Clear nasal passages Oral: Lips and gums without lesions. Tongue and palate mucosa without lesions. Posterior oropharynx clear. Fiberoptic laryngoscopy was performed to the left side.  The nasopharynx was clear.  Base of tongue vallecula epiglottis were normal.  She had moderate supraglottic edema with some redundant mucosa just above and medial to  the right arytenoid but this is soft and mobile with no ulceration.  Vocal cords were clear bilaterally with normal vocal cord mobility.  No evidence of recurrent or persistent neoplasm. Neck: No palpable adenopathy or masses Respiratory: Breathing comfortably  Skin: No facial/neck lesions or rash noted.  Laryngoscopy  Date/Time: 06/27/2020 1:58 PM Performed by: Rozetta Nunnery, MD Authorized by: Rozetta Nunnery, MD   Consent:    Consent obtained:  Verbal   Consent given by:  Patient Procedure details:    Indications: oncologic surveillance follow-up     Medication:  Afrin   Scope location: left nare   Sinus:    Left nasopharynx: normal   Mouth:    Oropharynx: normal     Vallecula: normal     Base of tongue: normal     Epiglottis: normal   Throat:    Pyriform sinus: normal     True vocal cords: normal   Comments:     Patient with moderate supraglottic edema but no evidence of any persistent cancer.  Vocal cords with normal mobility.    Assessment: Chronic rhinitis History of supraglottic cancer on the right AE fold status post radiation and chemotherapy completed in November 2019 No evidence of persistent disease on clinical exam.  Plan: She will follow-up in 6 months for recheck.  Earlier if she has any problems with her ear. I also prescribed Nasonex 2 sprays each nostril at night on a regular basis as well as saline rinse during the day. Also suggested trying XyliMelts for the chronic dry throat and thick  mucus. She will follow-up in 6 months for recheck.   Radene Journey, MD

## 2020-07-07 ENCOUNTER — Other Ambulatory Visit: Payer: Self-pay

## 2020-07-07 ENCOUNTER — Ambulatory Visit (HOSPITAL_COMMUNITY)
Admission: RE | Admit: 2020-07-07 | Discharge: 2020-07-07 | Disposition: A | Discharge status: Home / Self Care | Payer: Medicaid Other | Source: Ambulatory Visit | Attending: Radiation Oncology | Admitting: Radiation Oncology

## 2020-07-07 DIAGNOSIS — C321 Malignant neoplasm of supraglottis: Secondary | ICD-10-CM | POA: Insufficient documentation

## 2020-07-07 MED ORDER — SODIUM CHLORIDE (PF) 0.9 % IJ SOLN
INTRAMUSCULAR | Status: AC
  Filled 2020-07-07: qty 50

## 2020-07-07 MED ORDER — IOHEXOL 300 MG/ML  SOLN
75.0000 mL | Freq: Once | INTRAMUSCULAR | Status: AC | PRN
  Administered 2020-07-07: 75 mL via INTRAVENOUS

## 2020-07-10 ENCOUNTER — Other Ambulatory Visit: Payer: Self-pay

## 2020-07-10 ENCOUNTER — Encounter (HOSPITAL_COMMUNITY): Payer: Self-pay | Admitting: Emergency Medicine

## 2020-07-10 ENCOUNTER — Emergency Department (HOSPITAL_COMMUNITY)
Admission: EM | Admit: 2020-07-10 | Discharge: 2020-07-10 | Disposition: A | Discharge status: Home / Self Care | Payer: Medicaid Other | Attending: Emergency Medicine | Admitting: Emergency Medicine

## 2020-07-10 DIAGNOSIS — M549 Dorsalgia, unspecified: Secondary | ICD-10-CM | POA: Insufficient documentation

## 2020-07-10 DIAGNOSIS — Z5321 Procedure and treatment not carried out due to patient leaving prior to being seen by health care provider: Secondary | ICD-10-CM | POA: Diagnosis not present

## 2020-07-10 DIAGNOSIS — G8929 Other chronic pain: Secondary | ICD-10-CM | POA: Diagnosis not present

## 2020-07-10 DIAGNOSIS — F419 Anxiety disorder, unspecified: Secondary | ICD-10-CM | POA: Insufficient documentation

## 2020-07-10 DIAGNOSIS — R0602 Shortness of breath: Secondary | ICD-10-CM | POA: Diagnosis present

## 2020-07-10 LAB — CBC
HCT: 45.9 % (ref 36.0–46.0)
Hemoglobin: 15 g/dL (ref 12.0–15.0)
MCH: 31.7 pg (ref 26.0–34.0)
MCHC: 32.7 g/dL (ref 30.0–36.0)
MCV: 97 fL (ref 80.0–100.0)
Platelets: 253 10*3/uL (ref 150–400)
RBC: 4.73 MIL/uL (ref 3.87–5.11)
RDW: 13.6 % (ref 11.5–15.5)
WBC: 6.1 10*3/uL (ref 4.0–10.5)
nRBC: 0 % (ref 0.0–0.2)

## 2020-07-10 LAB — BASIC METABOLIC PANEL
Anion gap: 10 (ref 5–15)
BUN: 9 mg/dL (ref 6–20)
CO2: 21 mmol/L — ABNORMAL LOW (ref 22–32)
Calcium: 9.4 mg/dL (ref 8.9–10.3)
Chloride: 107 mmol/L (ref 98–111)
Creatinine, Ser: 0.63 mg/dL (ref 0.44–1.00)
GFR calc Af Amer: >60 mL/min (ref >60)
GFR calc non Af Amer: >60 mL/min (ref >60)
Glucose, Bld: 106 mg/dL — ABNORMAL HIGH (ref 70–99)
Potassium: 3.6 mmol/L (ref 3.5–5.1)
Sodium: 138 mmol/L (ref 135–145)

## 2020-07-10 NOTE — ED Notes (Signed)
Pt stated she didn't want to stay and get seen here due to wait times and covid. This NT informed pt we wanted her to stay and get seen by EDP. Pt stated she didn't want to risk getting sick and refused to stay. This NT informed pt that if she felt bad continually or got worse to seek care in ED. Pt verbalized understanding.

## 2020-07-10 NOTE — ED Triage Notes (Addendum)
Pt to triage via [facility] from home.  Reports SOB with exertion since this morning.  Used albuterol neb without improvement.  Reports chronic back pain.  Denies chest pain.  Per EMS, pt was anxious.  EMS administered Versed 2.5mg  IV.  Pt states anxiety started after EMS would not take her to [place] she requested [facility] and is upset she was brought here because her family members died at this hospital.  Pt also very upset about going to waiting room after triage.    IV- 18g L hand

## 2020-07-11 NOTE — Progress Notes (Signed)
Oncology Nurse Navigator Documentation  I returned Ms. Whitmire's call regarding her results of the CT chest completed on 07/07/20. She was able to view the results in mychart and was aware that no metastatic disease was noted. She did voice concerns about the coronary artery calcifications that were noted on the voice mail she left me. I advised her via voice mail, that the calcifications were not an unusual occurrence and if she had further concerns it was advisable to discuss them with her primary care provider. Dr. Isidore Moos is aware of the results of the CT chest. Ms. Milford did inform me last week that she does decline to come in to the clinic for results due to the current surge in COVID infections in our area.  I left my direct contact information for her to call me back if she had any further questions or concerns.   Harlow Asa RN, BSN, OCN Head & Neck Oncology Nurse [place] at [facility] Phone # [phone]  Fax # [phone]

## 2020-07-14 ENCOUNTER — Telehealth: Payer: Self-pay

## 2020-07-14 NOTE — Telephone Encounter (Signed)
REFERRAL ON FILE FROM [facility]ICINE [facility] WELL[facility] [phone], SENT REFERRAL TO [facility]

## 2020-07-26 ENCOUNTER — Ambulatory Visit: Payer: Self-pay | Admitting: Radiation Oncology

## 2020-08-02 ENCOUNTER — Encounter: Payer: Medicaid Other | Admitting: Neurology

## 2020-08-08 NOTE — Progress Notes (Signed)
Cardiology Office Note   Date:  08/10/2020   ID:  Sheri Rivera, DOB 30-Nov-1960, MRN [id number]  PCP:  Hayden Rasmussen, MD  Cardiologist:   Waris Rodger Martinique, MD   Chief Complaint  Patient presents with  . Coronary Artery Disease      History of Present Illness: Sheri Rivera is a 59 y.o. female who is seen at the request of Dr Darron Doom for evaluation of coronary calcification and aortic atherosclerosis. She has a history of tobacco use, COPD and laryngeal CA. CT done for follow up of her CA showed the above findings.   She notes an ongoing history of tobacco abuse. She has a family history of early CAD with father dying at age 85 with MI. Cholesterol status is unknown. She did have a cardiac cath in 2002 showing no significant CAD. She does have DOE when walking her dogs. She has a lot of indigestion and chest pain that she attributes to her hiatal hernia.     Past Medical History:  Diagnosis Date  . Anxiety 11/11/11  . Arthritis   . Asthma   . Bursitis    right shoulder  . Chronic back pain   . Chronic neck pain   . Chronic shoulder pain   . COPD (chronic obstructive pulmonary disease) ([place])    continues to smoke 1ppd  . DDD (degenerative disc disease)   . Depression   . Fibromyalgia   . GERD (gastroesophageal reflux disease)   . Heart murmur    as a child per pt  . Herpes   . History of radiation therapy 08/12/18- 10/03/18   Larynx, prescribed dose of 63 Gy in 28 fractions. She chose to stop treatments after 27 fractions.   . IBS (irritable bowel syndrome)   . Lumbar radiculopathy   . Seasonal allergies   . Smoker   . Thyroid nodule    goiters    Past Surgical History:  Procedure Laterality Date  . CHOLECYSTECTOMY    . DIRECT LARYNGOSCOPY N/A 07/11/2018   Procedure: DIRECT LARYNGOSCOPY WITH BIOPSY;  Surgeon: Rozetta Nunnery, MD;  Location: [facility];  Service: ENT;  Laterality: N/A;  . HERNIA REPAIR     Hiatal   . HIATAL HERNIA  REPAIR    . TUBAL LIGATION  1983     Current Outpatient Medications  Medication Sig Dispense Refill  . albuterol (PROVENTIL HFA;VENTOLIN HFA) 108 (90 BASE) MCG/ACT inhaler Inhale 2 puffs into the lungs every 6 (six) hours as needed for shortness of breath. Shortness of breath    . albuterol (PROVENTIL) (2.5 MG/3ML) 0.083% nebulizer solution Take 3 mLs (2.5 mg total) by nebulization every 4 (four) hours as needed for wheezing or shortness of breath. Shortness of breath 25 vial 0  . ibuprofen (ADVIL,MOTRIN) 600 MG tablet Take 600 mg by mouth 2 (two) times daily.    Marland Kitchen LORazepam (ATIVAN) 0.5 MG tablet Take 1 tab PRN extreme anxiety, not to exceed once daily. Use sparingly. Future Rx only at the discretion of the primary care provider. 20 tablet 0  . nicotine (NICODERM CQ - DOSED IN MG/24 HOURS) 21 mg/24hr patch      No current facility-administered medications for this visit.    Allergies:   Clindamycin/lincomycin, Cymbalta [duloxetine hcl], Levaquin [levofloxacin in d5w], Lyrica [pregabalin], Shellfish allergy, Sulfamethoxazole, and Codeine    Social History:  The patient  reports that she has been smoking cigarettes. She started smoking about 41 years ago. She  has a 61.50 pack-year smoking history. She has never used smokeless tobacco. She reports that she does not drink alcohol and does not use drugs.   Family History:  The patient's family history includes Arthritis in her mother; Asthma in her child and mother; Bursitis in her mother; Fibromyalgia in her sister; Heart attack (age of onset: 49) in her father; Lung cancer in her mother; Obesity in her father.    ROS:  Please see the history of present illness.   Otherwise, review of systems are positive for none.   All other systems are reviewed and negative.    PHYSICAL EXAM: VS:  BP 115/71   Pulse 66   Temp (!) 97.2 F (36.2 C)   Ht 5\' 3"  (1.6 m)   Wt 168 lb 3.2 oz (76.3 kg)   SpO2 93%   BMI 29.80 kg/m  , BMI Body mass index is  29.8 kg/m. GEN: Well nourished, well developed, in no acute distress  HEENT: normal  Neck: no JVD, carotid bruits, or masses Cardiac: RRR; no murmurs, rubs, or gallops,no edema  Respiratory:  clear to auscultation bilaterally, normal work of breathing GI: soft, nontender, nondistended, + BS MS: no deformity or atrophy  Skin: warm and dry, no rash Neuro:  Strength and sensation are intact Psych: euthymic mood, full affect   EKG:  EKG is ordered today. The ekg ordered today demonstrates NSR rate 66. Normal Ecg. I have personally reviewed and interpreted this study.    Recent Labs: 04/15/2020: TSH 3.849 07/10/2020: BUN 9; Creatinine, Ser 0.63; Hemoglobin 15.0; Platelets 253; Potassium 3.6; Sodium 138    Lipid Panel    Component Value Date/Time   CHOL (H) 09/01/2009 0508    208        ATP III CLASSIFICATION:  <200     mg/dL   Desirable  200-239  mg/dL   Borderline High  >=240    mg/dL   High          TRIG 105 09/01/2009 0508   HDL 57 09/01/2009 0508   CHOLHDL 3.6 09/01/2009 0508   VLDL 21 09/01/2009 0508   LDLCALC (H) 09/01/2009 0508    130        Total Cholesterol/HDL:CHD Risk Coronary Heart Disease Risk Table                     Men   Women  1/2 Average Risk   3.4   3.3  Average Risk       5.0   4.4  2 X Average Risk   9.6   7.1  3 X Average Risk  23.4   11.0        Use the calculated Patient Ratio above and the CHD Risk Table to determine the patient's CHD Risk.        ATP III CLASSIFICATION (LDL):  <100     mg/dL   Optimal  100-129  mg/dL   Near or Above                    Optimal  130-159  mg/dL   Borderline  160-189  mg/dL   High  >190     mg/dL   Very High      Wt Readings from Last 3 Encounters:  08/10/20 168 lb 3.2 oz (76.3 kg)  04/15/20 165 lb 6.4 oz (75 kg)  07/24/19 142 lb 12.8 oz (64.8 kg)      Other studies Reviewed: Additional  studies/ records that were reviewed today include:   CT CHEST WITH CONTRAST  TECHNIQUE: Multidetector CT  imaging of the chest was performed during intravenous contrast administration.  CONTRAST:  50mL OMNIPAQUE IOHEXOL 300 MG/ML  SOLN  COMPARISON:  PET-CT 02/05/2019  FINDINGS: Cardiovascular: The heart size appears within normal limits. Aortic atherosclerosis. Coronary artery atherosclerotic calcifications. No pericardial effusion.  Mediastinum/Nodes: No enlarged mediastinal, hilar, or axillary lymph nodes. Thyroid gland, trachea, and esophagus demonstrate no significant findings.  Lungs/Pleura: Moderate to advanced centrilobular and paraseptal emphysema. No pleural effusion, airspace consolidation or atelectasis. No suspicious lung nodules identified.  Upper Abdomen: No acute abnormality.  Musculoskeletal: No chest wall abnormality. No acute or significant osseous findings. Mild scoliosisease.  IMPRESSION: 1. No acute cardiopulmonary abnormalities. No specific findings identified to suggest residual metastatic disease. 2. Emphysema and aortic atherosclerosis. 3. Coronary artery calcifications.  Aortic Atherosclerosis (ICD10-I70.0) and Emphysema (ICD10-J43.9).   Electronically Signed   By: Kerby Moors M.D.   On: 07/08/2020 10:52    ASSESSMENT AND PLAN:  1.  Coronary calcification noted on CT. Has a left dominant system. Explained that this does not necessarily mean she has blockage but given her symptoms of DOE and chest pain/indigestion I feel further ischemic work up is needed. Will arrange a coronary CTA. Need to focus on risk factor modification which includes tobacco cessation and lipid lowering therapy. Will check a lipid panel. Consider adding statin 2. Tobacco abuse. Counseled on cessation 3. Laryngeal CA s/p RT 4. Family history of premature CAD. 5. COPD   Current medicines are reviewed at length with the patient today.  The patient does not have concerns regarding medicines.  The following changes have been made:  no change  Labs/ tests  ordered today include:   Orders Placed This Encounter  Procedures  . CT CORONARY MORPH W/CTA COR W/SCORE W/CA W/CM &/OR WO/CM  . CT CORONARY FRACTIONAL FLOW RESERVE DATA PREP  . CT CORONARY FRACTIONAL FLOW RESERVE FLUID ANALYSIS  . Basic metabolic panel  . Lipid panel  . EKG 12-Lead     Disposition:   FU with me pending results of above studies.  Signed, Kanani Mowbray Martinique, MD  08/10/2020 3:24 PM    [facility] [address], [place], [place], [address] Phone [phone], Fax [phone]

## 2020-08-09 ENCOUNTER — Ambulatory Visit: Payer: Medicaid Other | Admitting: Orthopaedic Surgery

## 2020-08-09 DIAGNOSIS — F411 Generalized anxiety disorder: Secondary | ICD-10-CM | POA: Diagnosis not present

## 2020-08-09 DIAGNOSIS — F331 Major depressive disorder, recurrent, moderate: Secondary | ICD-10-CM | POA: Diagnosis not present

## 2020-08-10 ENCOUNTER — Encounter: Payer: Self-pay | Admitting: Cardiology

## 2020-08-10 ENCOUNTER — Other Ambulatory Visit: Payer: Self-pay

## 2020-08-10 ENCOUNTER — Ambulatory Visit (INDEPENDENT_AMBULATORY_CARE_PROVIDER_SITE_OTHER): Payer: Medicaid Other | Admitting: Cardiology

## 2020-08-10 VITALS — BP 115/71 | HR 66 | Temp 97.2°F | Ht 63.0 in | Wt 168.2 lb

## 2020-08-10 DIAGNOSIS — Z720 Tobacco use: Secondary | ICD-10-CM | POA: Diagnosis not present

## 2020-08-10 DIAGNOSIS — I2584 Coronary atherosclerosis due to calcified coronary lesion: Secondary | ICD-10-CM

## 2020-08-10 DIAGNOSIS — I2510 Atherosclerotic heart disease of native coronary artery without angina pectoris: Secondary | ICD-10-CM | POA: Diagnosis not present

## 2020-08-10 DIAGNOSIS — R072 Precordial pain: Secondary | ICD-10-CM | POA: Diagnosis not present

## 2020-08-10 DIAGNOSIS — R079 Chest pain, unspecified: Secondary | ICD-10-CM

## 2020-08-10 DIAGNOSIS — C329 Malignant neoplasm of larynx, unspecified: Secondary | ICD-10-CM | POA: Diagnosis not present

## 2020-08-10 MED ORDER — METOPROLOL TARTRATE 50 MG PO TABS
ORAL_TABLET | ORAL | 0 refills | Status: DC
Start: 2020-08-10 — End: 2020-08-25

## 2020-08-10 NOTE — Patient Instructions (Addendum)
Medication Instructions:  Continue same medications   Lab Work: Lipid panel and bmet to be done 1 week before Coronary CT   Testing/Procedures: Coronary CT  Will be scheduled after approved by insurance   Follow instructions below   Follow-Up: At [facility], you and your health needs are our priority.  As part of our continuing mission to provide you with exceptional heart care, we have created designated Provider Care Teams.  These Care Teams include your primary Cardiologist (physician) and Advanced Practice Providers (APPs -  Physician Assistants and Nurse Practitioners) who all work together to provide you with the care you need, when you need it.  We recommend signing up for the patient portal called "MyChart".  Sign up information is provided on this After Visit Summary.  MyChart is used to connect with patients for Virtual Visits (Telemedicine).  Patients are able to view lab/test results, encounter notes, upcoming appointments, etc.  Non-urgent messages can be sent to your provider as well.   To learn more about what you can do with MyChart, go to [email or web address].    Your next appointment: To Be Determined    The format for your next appointment: Office    Provider:  Dr.Jordan     Your cardiac CT will be scheduled at one of the below locations:   [facility] [address] [place], [place] [address] [phone]  [facility] [address] [place], [place] [address] [phone]  If scheduled at [facility], please arrive at the [facility] main entrance of [facility] 30 minutes prior to test start time. Proceed to the [facility] Radiology Department (first floor) to check-in and test prep.  If scheduled at [facility], please arrive 15 mins early for check-in and test prep.  Please follow these instructions carefully (unless otherwise  directed):    On the Night Before the Test: . Be sure to Drink plenty of water. . Do not consume any caffeinated/decaffeinated beverages or chocolate 12 hours prior to your test. . Do not take any antihistamines 12 hours prior to your test.   On the Day of the Test: . Drink plenty of water. Do not drink any water within one hour of the test. . Do not eat any food 4 hours prior to the test. . You may take your regular medications prior to the test.  . Take metoprolol 50 mg two hours prior to test. . FEMALES- please wear underwire-free bra if available         After the Test: . Drink plenty of water. . After receiving IV contrast, you may experience a mild flushed feeling. This is normal. . On occasion, you may experience a mild rash up to 24 hours after the test. This is not dangerous. If this occurs, you can take Benadryl 25 mg and increase your fluid intake. . If you experience trouble breathing, this can be serious. If it is severe call 911 IMMEDIATELY. If it is mild, please call our office. . If you take any of these medications: Glipizide/Metformin, Avandament, Glucavance, please do not take 48 hours after completing test unless otherwise instructed.   Once we have confirmed authorization from your insurance company, we will call you to set up a date and time for your test. Based on how quickly your insurance processes prior authorizations requests, please allow up to 4 weeks to be contacted for scheduling your Cardiac CT appointment. Be  advised that routine Cardiac CT appointments could be scheduled as many as 8 weeks after your provider has ordered it.  For non-scheduling related questions, please contact the cardiac imaging nurse navigator should you have any questions/concerns: Marchia Bond, Cardiac Imaging Nurse Navigator Burley Saver, Interim Cardiac Imaging Nurse [facility] Direct Office Dial: [phone]   For scheduling needs,  including cancellations and rescheduling, please call Vivien Rota at [phone], option 3.

## 2020-08-16 ENCOUNTER — Ambulatory Visit: Payer: Medicaid Other | Admitting: Orthopaedic Surgery

## 2020-08-22 ENCOUNTER — Encounter: Payer: Self-pay | Admitting: Physician Assistant

## 2020-08-22 ENCOUNTER — Ambulatory Visit (INDEPENDENT_AMBULATORY_CARE_PROVIDER_SITE_OTHER): Payer: Medicaid Other | Admitting: Orthopaedic Surgery

## 2020-08-22 DIAGNOSIS — M5416 Radiculopathy, lumbar region: Secondary | ICD-10-CM

## 2020-08-22 MED ORDER — LIDOCAINE 5 % EX PTCH: 1.0000 | MEDICATED_PATCH | CUTANEOUS | 0 refills | Status: DC

## 2020-08-22 MED ORDER — PREDNISONE 10 MG (21) PO TBPK: ORAL_TABLET | ORAL | 0 refills | Status: DC

## 2020-08-22 NOTE — Progress Notes (Signed)
Office Visit Note   Patient: Sheri Rivera           Date of Birth: [birth year]           MRN: [id number] Visit Date: 08/22/2020              Requested by: Hayden Rasmussen, MD [address] [place],  [place] [address] PCP: Hayden Rasmussen, MD   Assessment & Plan: Visit Diagnoses:  1. 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  Due to the patient's temporary relief with epidural steroid injections I recommended that she follow-up with Dr. Lorin Mercy or Dr. Louanne Skye.  In the meantime, I will call in a steroid taper and Lidoderm patches.  We will also go ahead and refer her to pain management.  Call with concerns or questions anytime.  Follow-Up Instructions: No follow-ups on file.   Orders:  No orders of the defined types were placed in this encounter.  No orders of the defined types were placed in this encounter.     Procedures: No procedures performed   Clinical Data: No additional findings.   Subjective: Chief Complaint  Patient presents with  . Middle Back - Pain  . Lower Back - Pain    HPI patient is a pleasant 59 year old female who comes in today with continued mid and lower back pain.  She has had this for several years now.  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  She has had several courses of steroids which have provided temporary relief.  She has had epidural steroid injections by Dr. Ernestina Patches which have provided temporary relief as well.  She was referred to Dr. Kathyrn Sheriff but was turned away as evidently she was a no-show at that office several years back.  She comes in today for further evaluation treatment recommendation.  She notes her quality life is significantly decreased due to the pain.  She has pain with any activity to include going from the bed to the  toilet.  She has been taking NSAIDs and muscle relaxers without relief of symptoms.  She is having radiculopathy right lower extremity with numbness and tingling to the feet.  No bowel or bladder dysfunction.  Review of Systems as detailed in HPI.  All others reviewed and are negative.   Objective: Vital Signs: There were no vitals taken for this visit.  Physical Exam well-developed well-nourished female.  Alert oriented x3.  Ortho Exam lumbar exam shows moderate lower thoracic and lower lumbar spine and paraspinous tenderness.  Increased pain with lumbar extension.  Positive right leg raise on the right.  No focal weakness.  She is neurovascular intact distally.  Specialty Comments:  No specialty comments available.  Imaging: No new imaging   PMFS History: Patient Active Problem List   Diagnosis Date Noted  . 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 Acute respiratory failure with hypoxia ([place]) 08/03/2017  . GERD (gastroesophageal reflux disease) 06/13/2016  . Chronic obstructive pulmonary disease ([place]) 06/13/2016  . Current tobacco use 09/05/2015  . History of fracture of vertebra 02/16/2015  . Diffuse pain 04/03/2014  . Hemorrhage, postmenopausal 12/29/2013  . Chronic obstructive pulmonary disease with acute exacerbation ([place]) 10/30/2013  . COPD mixed type ([place]) 03/30/2013  . Abdominal pain, epigastric 02/25/2013  .  Dysphagia, unspecified(787.20) 02/25/2013  . Gas 02/25/2013  . Abdominal bloating 02/25/2013  . Precordial pain 02/16/2013  . Multinodular goiter 10/05/2012  . 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 Bladder cystocele 07/21/2012  . Clinical depression 11/11/2011  . Allergic rhinitis 11/11/2011  . URI (upper respiratory infection) 09/28/2011  . Sore throat 07/18/2011  . Neck swelling 06/22/2011  . Ingrown right big toenail 05/23/2011  . Chronic back pain 03/16/2011  . 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/05/2010  . Shortness of breath 03/15/2010    . GOITER, MULTINODULAR 07/01/2009  . FIBROMYALGIA 07/01/2009  . ALLERGIC RHINITIS CAUSE UNSPECIFIED 03/02/2008  . Anxiety 01/23/2007  . TOBACCO DEPENDENCE 01/23/2007  . GASTROESOPHAGEAL REFLUX, NO ESOPHAGITIS 01/23/2007   Past Medical History:  Diagnosis Date  . Anxiety 11/11/11  . Arthritis   . Asthma   . Bursitis    right shoulder  . Chronic back pain   . Chronic neck pain   . Chronic shoulder pain   . COPD (chronic obstructive pulmonary disease) ([place])    continues to smoke 1ppd  . DDD (degenerative disc disease)   . Depression   . Fibromyalgia   . GERD (gastroesophageal reflux disease)   . Heart murmur    as a child per pt  . Herpes   . History of radiation therapy 08/12/18- 10/03/18   Larynx, prescribed dose of 63 Gy in 28 fractions. She chose to stop treatments after 27 fractions.   . IBS (irritable bowel syndrome)   . Lumbar radiculopathy   . Seasonal allergies   . Smoker   . Thyroid nodule    goiters    Family History  Problem Relation Age of Onset  . Lung cancer Mother   . Arthritis Mother        Rheumatoid  . Bursitis Mother   . Asthma Mother   . Heart attack Father 8       Died of MI  . Obesity Father   . Fibromyalgia Sister   . Asthma Child     Past Surgical History:  Procedure Laterality Date  . CHOLECYSTECTOMY    . DIRECT LARYNGOSCOPY N/A 07/11/2018   Procedure: DIRECT LARYNGOSCOPY WITH BIOPSY;  Surgeon: Rozetta Nunnery, MD;  Location: [facility];  Service: ENT;  Laterality: N/A;  . HERNIA REPAIR     Hiatal   . HIATAL HERNIA REPAIR    . TUBAL LIGATION  1983   Social History   Occupational History    Employer: [employer]  Tobacco Use  . Smoking status: Current Some Day Smoker    Packs/day: 1.50    Years: 41.00    Pack years: 61.50    Types: Cigarettes    Start date: 72  . Smokeless tobacco: Never Used  . Tobacco comment: She had quit until COVID-19, She is smoking 4-5 cigarettes daily, 07/24/19  Vaping Use   . Vaping Use: Never used  Substance and Sexual Activity  . Alcohol use: No  . Drug use: No  . Sexual activity: Not on file

## 2020-08-23 ENCOUNTER — Ambulatory Visit: Payer: Medicaid Other | Admitting: Orthopaedic Surgery

## 2020-08-24 ENCOUNTER — Ambulatory Visit: Payer: Medicaid Other | Admitting: Orthopaedic Surgery

## 2020-08-25 ENCOUNTER — Telehealth: Payer: Self-pay | Admitting: Cardiology

## 2020-08-25 MED ORDER — METOPROLOL TARTRATE 50 MG PO TABS: ORAL_TABLET | ORAL | 0 refills | Status: DC

## 2020-08-25 NOTE — Telephone Encounter (Signed)
Will send this message to Dr. Doug Sou primary covering nurse, to make her aware pt is returning her call back.

## 2020-08-25 NOTE — Telephone Encounter (Signed)
Patient states she is returning Cheryl's call.

## 2020-08-25 NOTE — Telephone Encounter (Signed)
Spoke to patient she stated she has not received coronary ct appointment.Also pharmacy did not have metoprolol prescription.Advised I will send in another metoprolol prescription.I will message scheduler about coronary ct appointment.Advised it sometimes takes 2 weeks and longer.Insurance has to approve before scheduled.

## 2020-08-31 ENCOUNTER — Ambulatory Visit: Payer: Medicaid Other | Admitting: Orthopaedic Surgery

## 2020-08-31 ENCOUNTER — Encounter: Payer: Self-pay | Admitting: Physical Medicine and Rehabilitation

## 2020-09-05 ENCOUNTER — Encounter: Payer: Self-pay | Admitting: *Deleted

## 2020-09-05 ENCOUNTER — Telehealth: Payer: Self-pay | Admitting: Cardiology

## 2020-09-05 DIAGNOSIS — F331 Major depressive disorder, recurrent, moderate: Secondary | ICD-10-CM | POA: Diagnosis not present

## 2020-09-05 DIAGNOSIS — F411 Generalized anxiety disorder: Secondary | ICD-10-CM | POA: Diagnosis not present

## 2020-09-05 NOTE — Telephone Encounter (Signed)
Spoke to patient she stated coronary ct scheduled Monday 10/18 at 2:30 pm.Patient anxious.Instructions reviewed.She voiced understanding.Patient reassured.

## 2020-09-05 NOTE — Telephone Encounter (Signed)
Patient wanted to talk with Dr. Martinique or nurse about a chloresterol test. Please call

## 2020-09-05 NOTE — Telephone Encounter (Addendum)
Spoke with patient and she will come for labs this week She is very anxious about the Cardiac CT but will try to proceed as recommended

## 2020-09-06 ENCOUNTER — Ambulatory Visit (INDEPENDENT_AMBULATORY_CARE_PROVIDER_SITE_OTHER): Payer: Medicaid Other | Admitting: Orthopaedic Surgery

## 2020-09-06 ENCOUNTER — Encounter: Payer: Self-pay | Admitting: Orthopaedic Surgery

## 2020-09-06 VITALS — BP 116/84 | HR 95 | Ht 63.0 in | Wt 154.0 lb

## 2020-09-06 DIAGNOSIS — M5416 Radiculopathy, lumbar region: Secondary | ICD-10-CM | POA: Diagnosis not present

## 2020-09-06 DIAGNOSIS — M542 Cervicalgia: Secondary | ICD-10-CM | POA: Diagnosis not present

## 2020-09-06 DIAGNOSIS — G8929 Other chronic pain: Secondary | ICD-10-CM

## 2020-09-09 ENCOUNTER — Telehealth (HOSPITAL_COMMUNITY): Payer: Self-pay | Admitting: *Deleted

## 2020-09-09 NOTE — Telephone Encounter (Signed)
Attempted to call patient regarding upcoming cardiac CT appointment. Left message on voicemail with name and callback number  [place] RN Navigator Cardiac [facility] [phone] Office [phone] Cell

## 2020-09-12 ENCOUNTER — Ambulatory Visit (HOSPITAL_COMMUNITY): Admission: RE | Admit: 2020-09-12 | Payer: Medicaid Other | Source: Ambulatory Visit

## 2020-09-13 ENCOUNTER — Telehealth (HOSPITAL_COMMUNITY): Payer: Self-pay | Admitting: Emergency Medicine

## 2020-09-13 NOTE — Telephone Encounter (Signed)
Reaching out to patient to offer assistance regarding upcoming cardiac imaging study; pt verbalizes understanding of appt date/time, parking situation and where to check in, pre-test NPO status and medications ordered, and verified current allergies; name and call back number provided for further questions should they arise Sheri Bond RN Navigator Cardiac Imaging [place] [facility] [phone] office [phone] cell   Pt verbalized understanding to take PO metoprolol 2 hr prior to scan and will take ativan prior to scan for anxiety. States her daughter is driving her to her appt.  Pt states in the past she has had contrast and it causes trouble breathing ( she denies allergy to contrast). States it gives her anxiety and induces a panic attack.  (pt hx of COPD also)

## 2020-09-15 ENCOUNTER — Encounter (HOSPITAL_COMMUNITY): Payer: Self-pay

## 2020-09-15 ENCOUNTER — Other Ambulatory Visit: Payer: Self-pay

## 2020-09-15 ENCOUNTER — Ambulatory Visit (HOSPITAL_COMMUNITY)
Admission: RE | Admit: 2020-09-15 | Discharge: 2020-09-15 | Disposition: A | Discharge status: Home / Self Care | Payer: Medicaid Other | Source: Ambulatory Visit | Attending: Cardiology | Admitting: Cardiology

## 2020-09-15 DIAGNOSIS — R072 Precordial pain: Secondary | ICD-10-CM | POA: Diagnosis not present

## 2020-09-15 MED ORDER — IOHEXOL 350 MG/ML SOLN
80.0000 mL | Freq: Once | INTRAVENOUS | Status: AC | PRN
  Administered 2020-09-15: 80 mL via INTRAVENOUS

## 2020-09-15 MED ORDER — NITROGLYCERIN 0.4 MG SL SUBL: 0.8000 mg | SUBLINGUAL_TABLET | Freq: Once | SUBLINGUAL | Status: DC

## 2020-09-15 MED ORDER — NITROGLYCERIN 0.4 MG SL SUBL
SUBLINGUAL_TABLET | SUBLINGUAL | Status: AC
  Filled 2020-09-15: qty 2

## 2020-09-15 MED ORDER — METOPROLOL TARTRATE 5 MG/5ML IV SOLN: 5.0000 mg | INTRAVENOUS | Status: DC | PRN

## 2020-09-15 NOTE — Progress Notes (Signed)
Office Visit Note   Patient: Sheri Rivera           Date of Birth: 02-06-1961           MRN: [id number] Visit Date: 09/06/2020              Requested by: Hayden Rasmussen, MD [address] [place],  [place] [address] PCP: Hayden Rasmussen, MD   Assessment & Plan: Visit Diagnoses:  1. Chronic radicular pain of lower back   2. Cervicalgia     Plan: I discussed patient with numbness tingling in her toes and fingers pain that shoots all the way down her spine pain thoracic lumbar junction I am not sure if this is related to the radiation treatments that she had before.  She has been seen by neurology and recommend revisit with neurology for further recommendations.  Follow-Up Instructions: No follow-ups on file.   Orders:  Orders Placed This Encounter  Procedures  . Ambulatory referral to Neurology   No orders of the defined types were placed in this encounter.     Procedures: No procedures performed   Clinical Data: No additional findings.   Subjective: Chief Complaint  Patient presents with  . Lower Back - Pain    HPI 59 year old female seen with chronic back pain.  She was referred by Dr.Xu and she states she is not able to tolerate the pain any longer.  She is used ibuprofen ice 24 7.  She has right leg pain.  She also has numbness and tingling in her fingers also her toes.  She has pain in her bra strap states it feels like it is burning like a cork sometimes stabbing pain and shoots down her back.  She states her quality of life is terrible and she has had epidurals MRIs and nothing is helped.  Ibuprofen tends to bother her stomach.  She has a hard time keeping up with her grandchildren.  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  She is not been diagnosed with neuropathy at this point.  Additional problems with COPD, clinical depression some dysphagia related to her squamous cancer and also radiation.  She  reports a diagnosis of fibromyalgia. 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  Chart review shows she has been seen by Dr. Santiago [facility] and had NCV with EMG 06/07/2020 but I am unable to see the results. MRI scan lumbar showed moderate right L5 and left L4 foraminal stenosis.  Temporary relief with epidural. Review of Systems All other systems noncontributory to HPI.  Objective: Vital Signs: BP 116/84   Pulse 95   Ht 5\' 3"  (1.6 m)   Wt 154 lb (69.9 kg)   BMI 27.28 kg/m   Physical Exam Constitutional:      Appearance: She is well-developed.  HENT:     Head: Normocephalic.     Right Ear: External ear normal.     Left Ear: External ear normal.  Eyes:     Pupils: Pupils are equal, round, and reactive to light.  Neck:     Thyroid: No thyromegaly.     Trachea: No tracheal deviation.  Cardiovascular:     Rate and Rhythm: Normal rate.  Pulmonary:     Effort: Pulmonary effort is normal.  Abdominal:     Palpations: Abdomen is soft.  Skin:    General: Skin is warm and dry.  Neurological:  Mental Status: She is alert and oriented to person, place, and time.  Psychiatric:        Behavior: Behavior normal.     Ortho Exam patient has mild brachial plexus tenderness upper extremity reflexes are symmetrical.  No thenar or hyperthenar atrophy.  Specialty Comments:  No specialty comments available.  Imaging: CT CORONARY MORPH W/CTA COR W/SCORE W/CA W/CM &/OR WO/CM  Addendum Date: 09/15/2020   ADDENDUM REPORT: 09/15/2020 11:48 CLINICAL DATA:  59 yo female with CP EXAM: Cardiac/Coronary  CT TECHNIQUE: The patient was scanned on a [employer]. FINDINGS: A 120 kV prospective scan was triggered in the descending thoracic aorta at 111 HU's. Axial non-contrast 3 mm slices were carried out through the heart. The data set was analyzed on a dedicated work station and scored using the [place]. Gantry  rotation speed was 250 msecs and collimation was .6 mm. No beta blockade and 0.8 mg of sl NTG was given. The 3D data set was reconstructed in 5% intervals of the 67-82 % of the R-R cycle. Diastolic phases were analyzed on a dedicated work station using MPR, MIP and VRT modes. The patient received 80 cc of contrast. Aorta:  Normal size.  No calcifications.  No dissection. Aortic Valve:  Trileaflet.  No calcifications. Coronary Arteries:  Normal coronary origin.  Left dominance. RCA is a small nondominant artery.  There is no plaque. Left main is a large artery that gives rise to LAD and LCX arteries. LAD is a large vessel; there is minimal (0-24%) calcified stenosis in the proximal vessel followed by minimal (0-24%) calcified stenoses in the proximal to mid and mid vessel. LCX is a dominant artery that gives rise to large OM1, OM2 and PDA. There is minimal (0-24%) calcified stenosis in the proximal vessel. Other findings: Normal pulmonary vein drainage into the left atrium. Normal let atrial appendage without a thrombus. Normal size of the pulmonary artery. IMPRESSION: 1. Coronary calcium score of 172. This was 11 percentile for age and sex matched control. 2. Normal coronary origin with left dominance. 3. Miinimal nonobstructive CAD; CAD-RADS 1. Kirk Ruths Electronically Signed   By: Kirk Ruths M.D.   On: 09/15/2020 11:48   Result Date: 09/15/2020 EXAM: OVER-READ INTERPRETATION  CT CHEST The following report is an over-read performed by radiologist Dr. Vinnie Langton of [facility], [place] on 09/15/2020. This over-read does not include interpretation of cardiac or coronary anatomy or pathology. The coronary calcium score/coronary CTA interpretation by the cardiologist is attached. COMPARISON:  Chest CT 07/07/2020. FINDINGS: Mild emphysematous changes are noted in the visualized lungs. Within the visualized portions of the thorax there are no suspicious appearing pulmonary nodules or masses, there  is no acute consolidative airspace disease, no pleural effusions, no pneumothorax and no lymphadenopathy. Visualized portions of the upper abdomen are unremarkable. There are no aggressive appearing lytic or blastic lesions noted in the visualized portions of the skeleton. IMPRESSION: 1.  Emphysema (ICD10-J43.9). Electronically Signed: By: Vinnie Langton M.D. On: 09/15/2020 10:04   CLINICAL DATA:  Total spine pain worsening over the last year. Weakness and numbness in the legs.  EXAM: MRI CERVICAL SPINE WITHOUT CONTRAST  TECHNIQUE: Multiplanar, multisequence MR imaging of the cervical spine was performed. No intravenous contrast was administered.  COMPARISON:  07/17/2013  FINDINGS: Alignment: 2 mm anterolisthesis C5-6 and C6-7.  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  Cord: No cord compression or primary cord lesion.  Posterior Fossa, vertebral arteries, paraspinal tissues: Negative  Disc levels:  Foramen magnum is widely patent. C1-2 is unremarkable. C2-3 is congenitally fused as noted above with wide patency of the canal and foramina.  C3-4: Mild left-sided uncovertebral hypertrophy and facet degeneration. Mild left foraminal narrowing that could possibly affect the left C4 nerve.  C4-5: Negative interspace.  C5-6: Mild facet degeneration on the left. 2 mm of anterolisthesis. Mild bulging of the disc. No central canal stenosis. Mild foraminal narrowing on the left could affect the C6 nerve.  C6-7: 1 mm of anterolisthesis. No visible disc or facet pathology. No stenosis.  C7-T1: Normal interspace.  Since the previous study, findings appear unchanged.  IMPRESSION: No significant change noted since the study of 2014. 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  Left-sided spondylosis and facet degeneration at C3-4 with left foraminal narrowing that could affect the left C4 nerve.  Left-sided spondylosis and facet degeneration at  C5-6 with left foraminal narrowing that could affect the left C6 nerve.   Electronically Signed   By: Nelson Chimes M.D.   On: 03/13/2018 07:41  PMFS History: Patient Active Problem List   Diagnosis Date Noted  . Malignant neoplasm of supraglottis ([place]) 07/29/2018  . Acute respiratory failure with hypoxia ([facility]) 08/03/2017  . GERD (gastroesophageal reflux disease) 06/13/2016  . Chronic obstructive pulmonary disease ([facility]) 06/13/2016  . Current tobacco use 09/05/2015  . History of fracture of vertebra 02/16/2015  . Diffuse pain 04/03/2014  . Hemorrhage, postmenopausal 12/29/2013  . Chronic obstructive pulmonary disease with acute exacerbation ([place]) 10/30/2013  . COPD mixed type ([place]) 03/30/2013  . Abdominal pain, epigastric 02/25/2013  . Dysphagia, unspecified(787.20) 02/25/2013  . Gas 02/25/2013  . Abdominal bloating 02/25/2013  . Precordial pain 02/16/2013  . Multinodular goiter 10/05/2012  . 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 Bladder cystocele 07/21/2012  . Clinical depression 11/11/2011  . Allergic rhinitis 11/11/2011  . URI (upper respiratory infection) 09/28/2011  . Sore throat 07/18/2011  . Neck swelling 06/22/2011  . Ingrown right big toenail 05/23/2011  . Chronic back pain 03/16/2011  . 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/05/2010  . Shortness of breath 03/15/2010  . GOITER, MULTINODULAR 07/01/2009  . FIBROMYALGIA 07/01/2009  . ALLERGIC RHINITIS CAUSE UNSPECIFIED 03/02/2008  . Anxiety 01/23/2007  . TOBACCO DEPENDENCE 01/23/2007  . GASTROESOPHAGEAL REFLUX, NO ESOPHAGITIS 01/23/2007   Past Medical History:  Diagnosis Date  . Anxiety 11/11/11  . Arthritis   . Asthma   . Bursitis    right shoulder  . Chronic back pain   . Chronic neck pain   . Chronic shoulder pain   . COPD (chronic obstructive pulmonary disease) ([place])    continues to smoke 1ppd  . DDD (degenerative disc disease)   . Depression   . Fibromyalgia   . GERD (gastroesophageal reflux disease)    . Heart murmur    as a child per pt  . Herpes   . History of radiation therapy 08/12/18- 10/03/18   Larynx, prescribed dose of 63 Gy in 28 fractions. She chose to stop treatments after 27 fractions.   . IBS (irritable bowel syndrome)   . Lumbar radiculopathy   . Seasonal allergies   . Smoker   . Thyroid nodule    goiters    Family History  Problem Relation Age of Onset  . Lung cancer Mother   . Arthritis Mother        Rheumatoid  . Bursitis Mother   . Asthma Mother   . Heart attack Father 79  Died of MI  . Obesity Father   . Fibromyalgia Sister   . Asthma Child     Past Surgical History:  Procedure Laterality Date  . CHOLECYSTECTOMY    . DIRECT LARYNGOSCOPY N/A 07/11/2018   Procedure: DIRECT LARYNGOSCOPY WITH BIOPSY;  Surgeon: Rozetta Nunnery, MD;  Location: [facility];  Service: ENT;  Laterality: N/A;  . HERNIA REPAIR     [place]   . HIATAL HERNIA REPAIR    . TUBAL LIGATION  1983   Social History   Occupational History    Employer: [employer]  Tobacco Use  . Smoking status: Current Some Day Smoker    Packs/day: 1.50    Years: 41.00    Pack years: 61.50    Types: Cigarettes    Start date: 96  . Smokeless tobacco: Never Used  . Tobacco comment: She had quit until COVID-19, She is smoking 4-5 cigarettes daily, 07/24/19  Vaping Use  . Vaping Use: Never used  Substance and Sexual Activity  . Alcohol use: No  . Drug use: No  . Sexual activity: Not on file

## 2020-09-16 ENCOUNTER — Other Ambulatory Visit: Payer: Self-pay

## 2020-09-16 ENCOUNTER — Telehealth: Payer: Self-pay | Admitting: Cardiology

## 2020-09-16 DIAGNOSIS — I2510 Atherosclerotic heart disease of native coronary artery without angina pectoris: Secondary | ICD-10-CM

## 2020-09-16 DIAGNOSIS — E7800 Pure hypercholesterolemia, unspecified: Secondary | ICD-10-CM

## 2020-09-16 MED ORDER — ROSUVASTATIN CALCIUM 20 MG PO TABS
20.0000 mg | ORAL_TABLET | Freq: Every day | ORAL | 6 refills | Status: DC
Start: 2020-09-16 — End: 2022-10-11

## 2020-09-16 NOTE — Telephone Encounter (Signed)
New Message:    Pt said she saw her CT results on My-Chart, but she does not understand it. She would like for you to call and explain it to her please.

## 2020-09-16 NOTE — Telephone Encounter (Signed)
Spoke to patient coronary ct results given. 

## 2020-09-27 ENCOUNTER — Institutional Professional Consult (permissible substitution): Payer: Medicaid Other | Admitting: Pulmonary Disease

## 2020-09-27 DIAGNOSIS — F411 Generalized anxiety disorder: Secondary | ICD-10-CM | POA: Diagnosis not present

## 2020-09-27 DIAGNOSIS — J449 Chronic obstructive pulmonary disease, unspecified: Secondary | ICD-10-CM | POA: Diagnosis not present

## 2020-09-27 DIAGNOSIS — F331 Major depressive disorder, recurrent, moderate: Secondary | ICD-10-CM | POA: Diagnosis not present

## 2020-10-01 DIAGNOSIS — Z20822 Contact with and (suspected) exposure to covid-19: Secondary | ICD-10-CM | POA: Diagnosis not present

## 2020-10-04 ENCOUNTER — Ambulatory Visit (INDEPENDENT_AMBULATORY_CARE_PROVIDER_SITE_OTHER): Payer: Medicaid Other | Admitting: Otolaryngology

## 2020-10-05 ENCOUNTER — Encounter: Payer: Medicaid Other | Admitting: Physical Medicine and Rehabilitation

## 2020-10-26 ENCOUNTER — Institutional Professional Consult (permissible substitution): Payer: Medicaid Other | Admitting: Pulmonary Disease

## 2020-10-26 DIAGNOSIS — F411 Generalized anxiety disorder: Secondary | ICD-10-CM | POA: Diagnosis not present

## 2020-10-26 DIAGNOSIS — F331 Major depressive disorder, recurrent, moderate: Secondary | ICD-10-CM | POA: Diagnosis not present

## 2020-11-07 ENCOUNTER — Encounter: Payer: Medicaid Other | Admitting: Physical Medicine and Rehabilitation

## 2020-11-15 DIAGNOSIS — F411 Generalized anxiety disorder: Secondary | ICD-10-CM | POA: Diagnosis not present

## 2020-11-15 DIAGNOSIS — F331 Major depressive disorder, recurrent, moderate: Secondary | ICD-10-CM | POA: Diagnosis not present

## 2020-11-22 ENCOUNTER — Ambulatory Visit (HOSPITAL_COMMUNITY)
Admission: EM | Admit: 2020-11-22 | Discharge: 2020-11-22 | Disposition: A | Discharge status: Home / Self Care | Payer: Medicaid Other | Attending: Internal Medicine | Admitting: Internal Medicine

## 2020-11-22 ENCOUNTER — Other Ambulatory Visit: Payer: Self-pay

## 2020-11-22 ENCOUNTER — Encounter (HOSPITAL_COMMUNITY): Payer: Self-pay | Admitting: Emergency Medicine

## 2020-11-22 ENCOUNTER — Ambulatory Visit (INDEPENDENT_AMBULATORY_CARE_PROVIDER_SITE_OTHER): Payer: Medicaid Other

## 2020-11-22 DIAGNOSIS — Z20822 Contact with and (suspected) exposure to covid-19: Secondary | ICD-10-CM

## 2020-11-22 DIAGNOSIS — R509 Fever, unspecified: Secondary | ICD-10-CM

## 2020-11-22 DIAGNOSIS — R059 Cough, unspecified: Secondary | ICD-10-CM | POA: Diagnosis not present

## 2020-11-22 DIAGNOSIS — R0602 Shortness of breath: Secondary | ICD-10-CM | POA: Diagnosis not present

## 2020-11-22 LAB — RESP PANEL BY RT-PCR (FLU A&B, COVID) ARPGX2
Influenza A by PCR: NEGATIVE
Influenza B by PCR: NEGATIVE
SARS Coronavirus 2 by RT PCR: NEGATIVE

## 2020-11-22 NOTE — Discharge Instructions (Addendum)
Due to your history of cancer and if your covid test is positive, you qualify to receive Monoclonal Antibody infusion and if you don't hear from anyone within 24 hours of finding your results, call [phone]   I also ordered a flu test and if positive we will call you  Stay quarantined until you get your results.   Take Tylenol and or Advil for the headache

## 2020-11-22 NOTE — ED Provider Notes (Signed)
[facility]    CSN: [id number] Arrival date & time: 11/22/20  2229      History   Chief Complaint Chief Complaint  Patient presents with  . Nasal Congestion  . Cough  . Shortness of Breath    HPI Sheri Rivera is a 59 y.o. female who presents with nose congestion, SOB with short waks, fever, and non productive cough since yesterday. Her granddaughter tested positive for Covid yesterday and this child's mother who hosted xmas at her home had been running a fever that day and masked it with Advil. She also is having HA, and has pain on her R lower lung area. Her normal pulse ox is 95-96%, and dropped to 94% after walking today. Used her inhaler this am and pulse ox went back up to 97%. Has been taking Advil, and sudafed.     Past Medical History:  Diagnosis Date  . Anxiety 11/11/11  . Arthritis   . Asthma   . Bursitis    right shoulder  . Chronic back pain   . Chronic neck pain   . Chronic shoulder pain   . COPD (chronic obstructive pulmonary disease) ([facility])    continues to smoke 1ppd  . DDD (degenerative disc disease)   . Depression   . Fibromyalgia   . GERD (gastroesophageal reflux disease)   . Heart murmur    as a child per pt  . Herpes   . History of radiation therapy 08/12/18- 10/03/18   Larynx, prescribed dose of 63 Gy in 28 fractions. She chose to stop treatments after 27 fractions.   . IBS (irritable bowel syndrome)   . Lumbar radiculopathy   . Seasonal allergies   . Smoker   . Thyroid nodule    goiters    Patient Active Problem List   Diagnosis Date Noted  . 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  . Acute respiratory failure with hypoxia ([facility]) 08/03/2017  . GERD (gastroesophageal reflux disease) 06/13/2016  . Chronic obstructive pulmonary disease ([facility]) 06/13/2016  . Current tobacco use 09/05/2015  . History of fracture of vertebra 02/16/2015  . Diffuse pain 04/03/2014  . Hemorrhage, postmenopausal 12/29/2013  . Chronic  obstructive pulmonary disease with acute exacerbation ([facility]) 10/30/2013  . COPD mixed type ([facility]) 03/30/2013  . Abdominal pain, epigastric 02/25/2013  . Dysphagia, unspecified(787.20) 02/25/2013  . Gas 02/25/2013  . Abdominal bloating 02/25/2013  . Precordial pain 02/16/2013  . Multinodular goiter 10/05/2012  . 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 Bladder cystocele 07/21/2012  . Clinical depression 11/11/2011  . Allergic rhinitis 11/11/2011  . URI (upper respiratory infection) 09/28/2011  . Sore throat 07/18/2011  . Neck swelling 06/22/2011  . Ingrown right big toenail 05/23/2011  . Chronic back pain 03/16/2011  . 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/05/2010  . Shortness of breath 03/15/2010  . GOITER, MULTINODULAR 07/01/2009  . FIBROMYALGIA 07/01/2009  . ALLERGIC RHINITIS CAUSE UNSPECIFIED 03/02/2008  . Anxiety 01/23/2007  . TOBACCO DEPENDENCE 01/23/2007  . GASTROESOPHAGEAL REFLUX, NO ESOPHAGITIS 01/23/2007    Past Surgical History:  Procedure Laterality Date  . CHOLECYSTECTOMY    . DIRECT LARYNGOSCOPY N/A 07/11/2018   Procedure: DIRECT LARYNGOSCOPY WITH BIOPSY;  Surgeon: Drema Halon, MD;  Location: [facility];  Service: ENT;  Laterality: N/A;  . HERNIA REPAIR     [place]   . HIATAL HERNIA REPAIR    . TUBAL LIGATION  1983    OB History   No obstetric history on file.  Home Medications    Prior to Admission medications   Medication Sig Start Date End Date Taking? Authorizing Provider  albuterol (PROVENTIL HFA;VENTOLIN HFA) 108 (90 BASE) MCG/ACT inhaler Inhale 2 puffs into the lungs every 6 (six) hours as needed for shortness of breath. Shortness of breath   Yes [provider]  albuterol (PROVENTIL) (2.5 MG/3ML) 0.083% nebulizer solution Take 3 mLs (2.5 mg total) by nebulization every 4 (four) hours as needed for wheezing or shortness of breath. Shortness of breath 08/02/16  Yes Molpus, John, MD  ibuprofen (ADVIL,MOTRIN) 600 MG tablet  Take 600 mg by mouth 2 (two) times daily. 10/29/13   [provider]  lidocaine (LIDODERM) 5 % Place 1 patch onto the skin daily. Remove & Discard patch within 12 hours or as directed by MD 08/22/20   Cristie Hem, PA-C  LORazepam (ATIVAN) 0.5 MG tablet Take 1 tab PRN extreme anxiety, not to exceed once daily. Use sparingly. Future Rx only at the discretion of the primary care provider. 11/07/18   Lonie Peak, MD  metoprolol tartrate (LOPRESSOR) 50 MG tablet Take 50 mg   2 hours before Coronary CT 08/25/20   [place], Peter M, MD  predniSONE (STERAPRED UNI-PAK 21 TAB) 10 MG (21) TBPK tablet Take as directed Patient not taking: No sig reported 08/22/20   Cristie Hem, PA-C  rosuvastatin (CRESTOR) 20 MG tablet Take 1 tablet (20 mg total) by mouth daily. 09/16/20 12/15/20  [place], Peter M, MD    Family History Family History  Problem Relation Age of Onset  . Lung cancer Mother   . Arthritis Mother        Rheumatoid  . Bursitis Mother   . Asthma Mother   . Heart attack Father 15       Died of MI  . Obesity Father   . Fibromyalgia Sister   . Asthma Child     Social History Social History   Tobacco Use  . Smoking status: Current Some Day Smoker    Packs/day: 1.50    Years: 41.00    Pack years: 61.50    Types: Cigarettes    Start date: 71  . Smokeless tobacco: Never Used  . Tobacco comment: She had quit until COVID-19, She is smoking 4-5 cigarettes daily, 07/24/19  Vaping Use  . Vaping Use: Never used  Substance Use Topics  . Alcohol use: No  . Drug use: No     Allergies   Clindamycin/lincomycin, Cymbalta [duloxetine hcl], Levaquin [levofloxacin in d5w], Lyrica [pregabalin], Shellfish allergy, Sulfamethoxazole, and Codeine   Review of Systems Review of Systems  Constitutional: Positive for activity change, chills and fatigue. Negative for appetite change, diaphoresis and fever.  HENT: Positive for postnasal drip and rhinorrhea. Negative for congestion, sore  throat and trouble swallowing.   Eyes: Negative for discharge.  Respiratory: Positive for cough and shortness of breath. Negative for chest tightness and wheezing.   Cardiovascular: Negative for chest pain.  Gastrointestinal: Negative for diarrhea, nausea and vomiting.  Musculoskeletal: Positive for myalgias. Negative for gait problem.  Skin: Negative for rash.  Neurological: Positive for headaches.     Physical Exam Triage Vital Signs ED Triage Vitals  Enc Vitals Group     BP 11/22/20 1127 134/84     Pulse Rate 11/22/20 1127 69     Resp 11/22/20 1127 (!) 22     Temp 11/22/20 1127 97.6 F (36.4 C)     Temp Source 11/22/20 1127 Oral  SpO2 11/22/20 1127 99 %     Weight 11/22/20 1126 151 lb (68.5 kg)     Height 11/22/20 1126 5\' 3"  (1.6 m)     Head Circumference --      Peak Flow --      Pain Score 11/22/20 1125 0     Pain Loc --      Pain Edu? --      Excl. in [place]? --    No data found.  Updated Vital Signs BP 134/84 (BP Location: Left Arm)   Pulse 69   Temp 97.6 F (36.4 C) (Oral)   Resp (!) 22   Ht 5\' 3"  (1.6 m)   Wt 151 lb (68.5 kg)   SpO2 99%   BMI 26.75 kg/m   Visual Acuity Right Eye Distance:   Left Eye Distance:   Bilateral Distance:    Right Eye Near:   Left Eye Near:    Bilateral Near:     Physical Exam Physical Exam Vitals signs and nursing note reviewed.  Constitutional:      General: She is not in acute distress.    Appearance: Normal appearance. She is not ill-appearing, toxic-appearing or diaphoretic.  HENT:     Head: Normocephalic.     Right Ear: Tympanic membrane, ear canal and external ear normal except has a blue tube in.     Left Ear: Tympanic membrane, ear canal and external ear normal.     Nose: Nose normal.     Mouth/Throat:     Mouth: Mucous membranes are moist.  Eyes:     General: No scleral icterus.       Right eye: No discharge.        Left eye: No discharge.     Conjunctiva/sclera: Conjunctivae normal.  Neck:      Musculoskeletal: Neck supple. No neck rigidity.  Cardiovascular:     Rate and Rhythm: Normal rate and regular rhythm.     Heart sounds: No murmur.  Pulmonary:     Effort: Pulmonary effort is normal.     Breath sounds: Normal breath sounds.  .  Musculoskeletal: Normal range of motion.  Lymphadenopathy:     Cervical: No cervical adenopathy.  Skin:    General: Skin is warm and dry.     Coloration: Skin is not jaundiced.     Findings: No rash.  Neurological:     Mental Status: She is alert and oriented to person, place, and time.     Gait: Gait normal.  Psychiatric:        Mood and Affect: Mood normal.        Behavior: Behavior normal.        Thought Content: Thought content normal.        Judgment: Judgment normal.     UC Treatments / Results  Labs (all labs ordered are listed, but only abnormal results are displayed) Labs Reviewed  RESP PANEL BY RT-PCR (FLU A&B, COVID) [id number]    EKG   Radiology DG Chest 2 View  Result Date: 11/22/2020 CLINICAL DATA:  Shortness of breath with cough and fever EXAM: CHEST - 2 VIEW COMPARISON:  Apr 24, 2018 chest radiograph; chest CT July 07, 2020 FINDINGS: There is diffuse interstitial thickening throughout the lungs. Lungs borderline hyperexpanded. There is scarring in the medial apices. No edema or airspace opacity. Heart size and pulmonary vascularity are normal. No adenopathy. There is aortic atherosclerosis. No bone lesions. IMPRESSION: Lungs borderline hyperexpanded. Diffuse interstitial thickening throughout the  lungs is likely indicative of chronic bronchitis. No frank edema or airspace opacity. Scarring medial apices. Heart size normal. No adenopathy. Aortic Atherosclerosis (ICD10-I70.0). Electronically Signed   By: Lowella Grip III M.D.   On: 11/22/2020 12:26    Procedures Procedures (including critical care time)  Medications Ordered in UC Medications - No data to display  Initial Impression / Assessment and Plan / UC  Course  I have reviewed the triage vital signs and the nursing notes. Viral illness with suspect for Covid and Flu and covid test is pending. See instructions.  Pertinent  imaging results that were available during my care of the patient were reviewed by me and considered in my medical decision making (see chart for details).  Final Clinical Impressions(s) / UC Diagnoses   Final diagnoses:  Contact with and (suspected) exposure to covid-19  SOB (shortness of breath)     Discharge Instructions     Due to your history of cancer and if your covid test is positive, you qualify to receive Monoclonal Antibody infusion and if you don't hear from anyone within 24 hours of finding your results, call [phone]   I also ordered a flu test and if positive we will call you  Stay quarantined until you get your results.   Take Tylenol and or Advil for the headache     ED Prescriptions    None     PDMP not reviewed this encounter.   Shelby Mattocks, PA-C 11/22/20 1320

## 2020-11-22 NOTE — ED Triage Notes (Signed)
Patient c/o nasal congestion, sinus pressure, SOB, fever, and non-productive x 3 days.   Patient reports fever of 101 F at home.   Patient has taken nebulizer, inhaler, and OTC decongestant w/ no relief of symptoms.    History of COPD.

## 2020-12-15 DIAGNOSIS — F411 Generalized anxiety disorder: Secondary | ICD-10-CM | POA: Diagnosis not present

## 2020-12-15 DIAGNOSIS — F331 Major depressive disorder, recurrent, moderate: Secondary | ICD-10-CM | POA: Diagnosis not present

## 2020-12-23 ENCOUNTER — Encounter
Payer: Medicaid Other | Attending: Physical Medicine and Rehabilitation | Admitting: Physical Medicine and Rehabilitation

## 2020-12-27 ENCOUNTER — Other Ambulatory Visit: Payer: Self-pay

## 2020-12-27 ENCOUNTER — Ambulatory Visit (INDEPENDENT_AMBULATORY_CARE_PROVIDER_SITE_OTHER): Payer: Medicaid Other | Admitting: Otolaryngology

## 2020-12-27 VITALS — Temp 96.6°F

## 2020-12-27 DIAGNOSIS — J310 Chronic rhinitis: Secondary | ICD-10-CM | POA: Diagnosis not present

## 2020-12-27 DIAGNOSIS — T485X5A Adverse effect of other anti-common-cold drugs, initial encounter: Secondary | ICD-10-CM

## 2020-12-27 DIAGNOSIS — Z8521 Personal history of malignant neoplasm of larynx: Secondary | ICD-10-CM

## 2020-12-27 NOTE — Progress Notes (Signed)
HPI: Sheri Rivera is a 60 y.o. female who returns today for evaluation of history of supraglottic cancer status post chemoradiation treatment completed in November 2019.  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  She has done well since radiation chemotherapy. However recently she developed Covid in December and since that time has had chronic nasal congestion and trouble breathing through her nose.  She has started to use over-the-counter decongestant spray on a regular basis and has been using this daily and still having trouble breathing through her nose.  She has been on Z-Pak as well as Augmentin for 10days.  She has also been using Flonase but this is not helping.  She feels pressure in the head as well as difficulty breathing through her nose.Marland Kitchen  Past Medical History:  Diagnosis Date  . Anxiety 11/11/11  . Arthritis   . Asthma   . Bursitis    right shoulder  . Chronic back pain   . Chronic neck pain   . Chronic shoulder pain   . COPD (chronic obstructive pulmonary disease) ([place])    continues to smoke 1ppd  . DDD (degenerative disc disease)   . Depression   . Fibromyalgia   . GERD (gastroesophageal reflux disease)   . Heart murmur    as a child per pt  . Herpes   . History of radiation therapy 08/12/18- 10/03/18   Larynx, prescribed dose of 63 Gy in 28 fractions. She chose to stop treatments after 27 fractions.   . IBS (irritable bowel syndrome)   . Lumbar radiculopathy   . Seasonal allergies   . Smoker   . Thyroid nodule    goiters   Past Surgical History:  Procedure Laterality Date  . CHOLECYSTECTOMY    . DIRECT LARYNGOSCOPY N/A 07/11/2018   Procedure: DIRECT LARYNGOSCOPY WITH BIOPSY;  Surgeon: Rozetta Nunnery, MD;  Location: [facility];  Service: ENT;  Laterality: N/A;  . HERNIA REPAIR     [place]   . HIATAL HERNIA REPAIR    . TUBAL LIGATION  1983   Social History   Socioeconomic History  . Marital status: Divorced     Spouse name: Not on file  . Number of children: 3  . Years of education: Not on file  . Highest education level: Not on file  Occupational History    Employer: [employer]  Tobacco Use  . Smoking status: Current Some Day Smoker    Packs/day: 1.50    Years: 41.00    Pack years: 61.50    Types: Cigarettes    Start date: 71  . Smokeless tobacco: Never Used  . Tobacco comment: She had quit until COVID-19, She is smoking 4-5 cigarettes daily, 07/24/19  Vaping Use  . Vaping Use: Never used  Substance and Sexual Activity  . Alcohol use: No  . Drug use: No  . Sexual activity: Not on file  Other Topics Concern  . Not on file  Social History Narrative   She lives by herself   Social Determinants of Health   Financial Resource Strain: Not on file  Food Insecurity: Not on file  Transportation Needs: Not on file  Physical Activity: Not on file  Stress: Not on file  Social Connections: Not on file   Family History  Problem Relation Age of Onset  . Lung cancer Mother   . Arthritis Mother        Rheumatoid  . Bursitis Mother   .  Asthma Mother   . Heart attack Father 63       Died of MI  . Obesity Father   . Fibromyalgia Sister   . Asthma Child    Allergies  Allergen Reactions  . Clindamycin/Lincomycin Anaphylaxis and Swelling  . Cymbalta [Duloxetine Hcl] Anaphylaxis and Itching    Lips and throat swelled; stopped breathing  . Levaquin [Levofloxacin In D5w] Anaphylaxis and Itching  . Lyrica [Pregabalin] Shortness Of Breath and Swelling    Lips and throat swelled  . 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 Sulfamethoxazole Anaphylaxis  . Codeine Anxiety    Sweating,nervous   Prior to Admission medications   Medication Sig Start Date End Date Taking? Authorizing Provider  albuterol (PROVENTIL HFA;VENTOLIN HFA) 108 (90 BASE) MCG/ACT inhaler Inhale 2 puffs into the lungs every 6 (six) hours as needed for shortness of breath. Shortness of breath    [provider]  albuterol (PROVENTIL) (2.5 MG/3ML) 0.083% nebulizer solution Take 3 mLs (2.5 mg total) by nebulization every 4 (four) hours as needed for wheezing or shortness of breath. Shortness of breath 08/02/16   Molpus, Jenny Reichmann, MD  ibuprofen (ADVIL,MOTRIN) 600 MG tablet Take 600 mg by mouth 2 (two) times daily. 10/29/13   [provider]  lidocaine (LIDODERM) 5 % Place 1 patch onto the skin daily. Remove & Discard patch within 12 hours or as directed by MD 08/22/20   Aundra Dubin, PA-C  LORazepam (ATIVAN) 0.5 MG tablet Take 1 tab PRN extreme anxiety, not to exceed once daily. Use sparingly. Future Rx only at the discretion of the primary care provider. 11/07/18   Eppie Gibson, MD  metoprolol tartrate (LOPRESSOR) 50 MG tablet Take 50 mg   2 hours before Coronary CT 08/25/20   Martinique, Peter M, MD  predniSONE (STERAPRED UNI-PAK 21 TAB) 10 MG (21) TBPK tablet Take as directed Patient not taking: No sig reported 08/22/20   Aundra Dubin, PA-C  rosuvastatin (CRESTOR) 20 MG tablet Take 1 tablet (20 mg total) by mouth daily. 09/16/20 12/15/20  Martinique, Peter M, MD     Positive ROS: Otherwise negative  All other systems have been reviewed and were otherwise negative with the exception of those mentioned in the HPI and as above.  Physical Exam: Constitutional: Alert, well-appearing, no acute distress Ears: External ears without lesions or tenderness. Ear canals are clear bilaterally with intact, clear TMs.  Nasal: External nose without lesions. Septum is slightly deviated to the left.  She has severe rhinitis with diffuse swelling of the mucous membranes and a large amount of clear thick mucus on both sides.. Oral: Lips and gums without lesions. Tongue and palate mucosa without lesions. Posterior oropharynx clear. Fiberoptic laryngoscopy was performed to the right nostril.  She has moderate rhinitis.  The nasopharynx was clear.  Only clear mucus noted in the posterior nasal cavity.  The base  of tongue vallecula and epiglottis are clear with no ulcerations.  She has mild supraglottic edema.  Vocal cords are clear bilaterally with normal vocal cord mobility.  No evidence of recurrent cancer or persistent cancer. Neck: No palpable adenopathy or masses Respiratory: Breathing comfortably  Skin: No facial/neck lesions or rash noted.  Laryngoscopy  Date/Time: 12/27/2020 6:30 PM Performed by: Rozetta Nunnery, MD Authorized by: Rozetta Nunnery, MD   Consent:    Consent obtained:  Verbal   Consent given by:  Patient Procedure details:    Indications: direct visualization of the upper aerodigestive  tract and oncologic surveillance follow-up     Medication:  Afrin   Instrument: flexible fiberoptic laryngoscope     Scope location: right nare   Sinus:    Right nasopharynx: normal   Mouth:    Oropharynx: normal     Vallecula: normal     Epiglottis: normal   Throat:    True vocal cords: normal   Comments:     Patient has moderate supraglottic edema but no evidence of recurrent or persistent cancer.  Vocal cords had normal mobility bilaterally.    Assessment: History of supraglottic cancer with no evidence of recurrent or persistent disease on clinical exam today. Chronic rhinitis with rhinitis medicamentosa.  On clinical exam no evidence of active infection although she may have some viral problem going on secondary to Covid.  Plan: Recommended stopping the decongestant spray. Prescribed Nasacort 2 sprays each nostril at night and azelastine 1 spray twice daily. During the day recommended use of saline irrigations. She will follow-up in 6 months for recheck.  She will notify us of nasal symptoms of not improved within 3 to 4 weeks. Recommend use of Breathe Right strips to help her breathe better at night.   Radene Journey, MD

## 2021-01-02 DIAGNOSIS — F411 Generalized anxiety disorder: Secondary | ICD-10-CM | POA: Diagnosis not present

## 2021-01-02 DIAGNOSIS — F331 Major depressive disorder, recurrent, moderate: Secondary | ICD-10-CM | POA: Diagnosis not present

## 2021-01-23 DIAGNOSIS — F411 Generalized anxiety disorder: Secondary | ICD-10-CM | POA: Diagnosis not present

## 2021-01-23 DIAGNOSIS — F331 Major depressive disorder, recurrent, moderate: Secondary | ICD-10-CM | POA: Diagnosis not present

## 2021-02-07 DIAGNOSIS — F331 Major depressive disorder, recurrent, moderate: Secondary | ICD-10-CM | POA: Diagnosis not present

## 2021-02-07 DIAGNOSIS — F411 Generalized anxiety disorder: Secondary | ICD-10-CM | POA: Diagnosis not present

## 2021-02-13 ENCOUNTER — Encounter: Payer: Medicaid Other | Admitting: Physical Medicine and Rehabilitation

## 2021-02-21 DIAGNOSIS — F331 Major depressive disorder, recurrent, moderate: Secondary | ICD-10-CM | POA: Diagnosis not present

## 2021-02-21 DIAGNOSIS — F411 Generalized anxiety disorder: Secondary | ICD-10-CM | POA: Diagnosis not present

## 2021-03-03 DIAGNOSIS — F411 Generalized anxiety disorder: Secondary | ICD-10-CM | POA: Diagnosis not present

## 2021-03-03 DIAGNOSIS — F331 Major depressive disorder, recurrent, moderate: Secondary | ICD-10-CM | POA: Diagnosis not present

## 2021-03-14 DIAGNOSIS — F411 Generalized anxiety disorder: Secondary | ICD-10-CM | POA: Diagnosis not present

## 2021-03-14 DIAGNOSIS — F331 Major depressive disorder, recurrent, moderate: Secondary | ICD-10-CM | POA: Diagnosis not present

## 2021-03-31 ENCOUNTER — Encounter: Payer: Medicaid Other | Admitting: Physical Medicine and Rehabilitation

## 2021-03-31 ENCOUNTER — Telehealth: Payer: Self-pay

## 2021-03-31 DIAGNOSIS — M5416 Radiculopathy, lumbar region: Secondary | ICD-10-CM

## 2021-03-31 DIAGNOSIS — G8929 Other chronic pain: Secondary | ICD-10-CM

## 2021-03-31 NOTE — Telephone Encounter (Signed)
[facility] called stating that a new referral is needed for pain management for patient since it has been a long time.  Fax# [phone], CB# [phone].  Please advise.  Thank you.

## 2021-03-31 NOTE — Telephone Encounter (Signed)
New referral in chart.

## 2021-03-31 NOTE — Addendum Note (Signed)
Addended by: Elvin So L on: 03/31/2021 11:15 AM   Modules accepted: Orders

## 2021-03-31 NOTE — Telephone Encounter (Signed)
This has been sent to pain management

## 2021-04-04 DIAGNOSIS — F411 Generalized anxiety disorder: Secondary | ICD-10-CM | POA: Diagnosis not present

## 2021-04-04 DIAGNOSIS — F331 Major depressive disorder, recurrent, moderate: Secondary | ICD-10-CM | POA: Diagnosis not present

## 2021-04-20 ENCOUNTER — Telehealth: Payer: Self-pay | Admitting: *Deleted

## 2021-04-20 NOTE — Telephone Encounter (Signed)
CALLED PATIENT TO ASK ABOUT DOING A LAB ON 04-21-21 @ 1:30 PM PRIOR TO 2 PM FU, LVM FOR   A RETURN CALL

## 2021-04-20 NOTE — Progress Notes (Signed)
Oncology Nurse Navigator Documentation  I received a voice mail message from [name] this morning. She stated that she wanted to cancel her appointment with Dr. Isidore Rivera tomorrow because she would like a PET scan completed before she sees her for assessment. I called [name] back and left a voice mail with her encouraging her to come in to see Dr. Isidore Rivera for a physical examination and possible Laryngoscope tomorrow. I explained that she could discuss with Dr. Isidore Rivera what the recommendations are for continued follow up after her treatment for Laryngeal cancer which completed in 09/2018 and her request for a PET scan. I left my direct contact information on the voice mail and requested that she call me back. I will notify Dr. Isidore Rivera and Sheri Forts RN of the phone call as well.   Harlow Asa RN, BSN, OCN Head & Neck Oncology Nurse [place] at [facility] Phone # [phone]  Fax # [phone]

## 2021-04-21 ENCOUNTER — Ambulatory Visit: Payer: Medicaid Other | Attending: Radiation Oncology

## 2021-04-21 ENCOUNTER — Ambulatory Visit: Payer: Medicaid Other | Attending: Radiation Oncology | Admitting: Radiation Oncology

## 2021-04-25 ENCOUNTER — Telehealth: Payer: Self-pay | Admitting: Family Medicine

## 2021-04-25 NOTE — Telephone Encounter (Signed)
..   Medicaid Managed Care   Unsuccessful Outreach Note  04/25/2021 Name: Sheri Rivera MRN: [id number] DOB: July 28, 1961  Referred by: Hayden Rasmussen, MD Reason for referral : High Risk Managed Medicaid (Attempted to reach Ms.Kraft today to get her scheduled for a phone visit with the [facility]. I left my name and number for her to return my call.)   An unsuccessful telephone outreach was attempted today. The patient was referred to the case management team for assistance with care management and care coordination.   Follow Up Plan: The care management team will reach out to the patient again over the next 7-14 days.   [place]

## 2021-05-01 ENCOUNTER — Telehealth: Payer: Self-pay | Admitting: Family Medicine

## 2021-05-01 NOTE — Telephone Encounter (Signed)
.   Medicaid Managed Care   Unsuccessful Outreach Note  05/01/2021 Name: Sheri Rivera MRN: [id number] DOB: [birth year]  Referred by: Hayden Rasmussen, MD Reason for referral : High Risk Managed Medicaid (Patient returned my call and when I called her back I got her VM. Attempting to schedule patient with the [facility] team for aphone visit. )   A second unsuccessful telephone outreach was attempted today. The patient was referred to the case management team for assistance with care management and care coordination.   Follow Up Plan: The care management team will reach out to the patient again over the next 5 days.   [place]

## 2021-05-02 DIAGNOSIS — F411 Generalized anxiety disorder: Secondary | ICD-10-CM | POA: Diagnosis not present

## 2021-05-02 DIAGNOSIS — F331 Major depressive disorder, recurrent, moderate: Secondary | ICD-10-CM | POA: Diagnosis not present

## 2021-05-09 DIAGNOSIS — M5136 Other intervertebral disc degeneration, lumbar region: Secondary | ICD-10-CM | POA: Diagnosis not present

## 2021-05-09 DIAGNOSIS — M5030 Other cervical disc degeneration, unspecified cervical region: Secondary | ICD-10-CM | POA: Diagnosis not present

## 2021-05-26 ENCOUNTER — Ambulatory Visit: Payer: Medicaid Other | Admitting: Obstetrics and Gynecology

## 2021-06-05 ENCOUNTER — Encounter: Payer: Medicaid Other | Admitting: Physical Medicine and Rehabilitation

## 2021-06-06 ENCOUNTER — Telehealth: Payer: Self-pay | Admitting: *Deleted

## 2021-06-06 NOTE — Telephone Encounter (Signed)
CALLED PATIENT TO ASK ABOUT SCHEDULING FU WITH DR. Isidore Moos, LVM FOR A RETURN CALL

## 2021-06-12 DIAGNOSIS — F331 Major depressive disorder, recurrent, moderate: Secondary | ICD-10-CM | POA: Diagnosis not present

## 2021-06-12 DIAGNOSIS — F411 Generalized anxiety disorder: Secondary | ICD-10-CM | POA: Diagnosis not present

## 2021-06-15 DIAGNOSIS — R202 Paresthesia of skin: Secondary | ICD-10-CM | POA: Diagnosis not present

## 2021-07-08 ENCOUNTER — Encounter: Payer: Self-pay | Admitting: Emergency Medicine

## 2021-07-08 ENCOUNTER — Ambulatory Visit (INDEPENDENT_AMBULATORY_CARE_PROVIDER_SITE_OTHER): Payer: Medicaid Other

## 2021-07-08 ENCOUNTER — Other Ambulatory Visit: Payer: Self-pay

## 2021-07-08 ENCOUNTER — Ambulatory Visit
Admission: EM | Admit: 2021-07-08 | Discharge: 2021-07-08 | Disposition: A | Discharge status: Home / Self Care | Payer: Medicaid Other | Attending: Internal Medicine | Admitting: Internal Medicine

## 2021-07-08 DIAGNOSIS — R0602 Shortness of breath: Secondary | ICD-10-CM | POA: Diagnosis not present

## 2021-07-08 DIAGNOSIS — Z20822 Contact with and (suspected) exposure to covid-19: Secondary | ICD-10-CM | POA: Insufficient documentation

## 2021-07-08 DIAGNOSIS — Z20818 Contact with and (suspected) exposure to other bacterial communicable diseases: Secondary | ICD-10-CM | POA: Diagnosis not present

## 2021-07-08 DIAGNOSIS — R059 Cough, unspecified: Secondary | ICD-10-CM | POA: Diagnosis not present

## 2021-07-08 DIAGNOSIS — J069 Acute upper respiratory infection, unspecified: Secondary | ICD-10-CM | POA: Diagnosis not present

## 2021-07-08 DIAGNOSIS — J189 Pneumonia, unspecified organism: Secondary | ICD-10-CM | POA: Diagnosis not present

## 2021-07-08 LAB — POCT RAPID STREP A (OFFICE): Rapid Strep A Screen: NEGATIVE

## 2021-07-08 MED ORDER — AZITHROMYCIN 500 MG PO TABS: 500.0000 mg | ORAL_TABLET | Freq: Every day | ORAL | 0 refills | Status: AC

## 2021-07-08 MED ORDER — AMOXICILLIN-POT CLAVULANATE 875-125 MG PO TABS: 1.0000 | ORAL_TABLET | Freq: Two times a day (BID) | ORAL | 0 refills | Status: DC

## 2021-07-08 MED ORDER — PREDNISONE 10 MG (21) PO TBPK: ORAL_TABLET | Freq: Every day | ORAL | 0 refills | Status: DC

## 2021-07-08 NOTE — Discharge Instructions (Addendum)
Your x-ray did show signs of pneumonia.  You are being treated with 2 antibiotics and a prednisone steroid taper.  Please monitor oxygen saturation very closely and go to the hospital if symptoms or shortness of breath worsen.

## 2021-07-08 NOTE — ED Triage Notes (Signed)
Patient c/o SOB, sinus pressure, chest tightness, non-productive cough.  Patient has taken Mucinex, Ibuprofen, OTC decongestant.  Negative home COVID test.  Patient is vaccinated for COVID.

## 2021-07-08 NOTE — ED Provider Notes (Signed)
[facility]    CSN: [id number] Arrival date & time: 07/08/21  1245      History   Chief Complaint Chief Complaint  Patient presents with   Facial Pain    HPI Sheri Rivera is a 60 y.o. female.   Patient presents with 6-day history of shortness of breath, nasal congestion, chest tightness, cough.  Patient was also exposed to strep throat about 6-7 days ago.  Denies any known fevers or chest pain.  Has taken over-the-counter Mucinex and ibuprofen with no relief of symptoms.  Patient does have history of COPD and took at home albuterol nebulizer with minimal relief of symptoms.  Patient does have pulse oximeter at home and has been measuring oxygen where it has been ranging from 90-94 since acute illness started.  Patient also took negative at-home COVID-19 test.     Past Medical History:  Diagnosis Date   Anxiety 11/11/11   Arthritis    Asthma    Bursitis    right shoulder   Chronic back pain    Chronic neck pain    Chronic shoulder pain    COPD (chronic obstructive pulmonary disease) ([facility])    continues to smoke 1ppd   DDD (degenerative disc disease)    Depression    Fibromyalgia    GERD (gastroesophageal reflux disease)    Heart murmur    as a child per pt   Herpes    History of radiation therapy 08/12/18- 10/03/18   Larynx, prescribed dose of 63 Gy in 28 fractions. She chose to stop treatments after 27 fractions.    IBS (irritable bowel syndrome)    Lumbar radiculopathy    Seasonal allergies    Smoker    Thyroid nodule    goiters    Patient Active Problem List   Diagnosis Date Noted   Malignant neoplasm of supraglottis ([place]) 07/29/2018   Acute respiratory failure with hypoxia ([facility]) 08/03/2017   GERD (gastroesophageal reflux disease) 06/13/2016   Chronic obstructive pulmonary disease ([place]) 06/13/2016   Current tobacco use 09/05/2015   History of fracture of vertebra 02/16/2015   Diffuse pain 04/03/2014   Hemorrhage, postmenopausal 12/29/2013    Chronic obstructive pulmonary disease with acute exacerbation ([place]) 10/30/2013   COPD mixed type ([place]) 03/30/2013   Abdominal pain, epigastric 02/25/2013   Dysphagia, unspecified(787.20) 02/25/2013   Gas 02/25/2013   Abdominal bloating 02/25/2013   Precordial pain 02/16/2013   Multinodular goiter 10/05/2012   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   Bladder cystocele 07/21/2012   Clinical depression 11/11/2011   Allergic rhinitis 11/11/2011   URI (upper respiratory infection) 09/28/2011   Sore throat 07/18/2011   Neck swelling 06/22/2011   Ingrown right big toenail 05/23/2011   Chronic back pain 03/16/2011   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  Home Medications    Prior to Admission medications   Medication Sig Start Date End Date Taking? Authorizing Provider  albuterol (PROVENTIL HFA;VENTOLIN HFA) 108 (90 BASE) MCG/ACT inhaler Inhale 2 puffs into the lungs every 6 (six) hours as needed for shortness of breath. Shortness of breath   Yes [provider]  albuterol (PROVENTIL) (2.5 MG/3ML) 0.083% nebulizer solution Take 3 mLs (2.5 mg total) by nebulization every 4 (four) hours as needed for wheezing or shortness of breath. Shortness of breath 08/02/16  Yes Molpus, John, MD  amoxicillin-clavulanate (AUGMENTIN) 875-125 MG tablet Take  1 tablet by mouth every 12 (twelve) hours. 07/08/21  Yes Odis Luster, FNP  azithromycin (ZITHROMAX) 500 MG tablet Take 1 tablet (500 mg total) by mouth daily for 5 days. 07/08/21 07/13/21 Yes Odis Luster, FNP  ibuprofen (ADVIL,MOTRIN) 600 MG tablet Take 600 mg by mouth 2 (two) times daily. 10/29/13  Yes [provider]  LORazepam (ATIVAN) 0.5 MG tablet Take 1 tab PRN extreme anxiety, not to exceed once daily. Use sparingly. Future Rx only at the discretion of the primary care provider. 11/07/18  Yes Eppie Gibson, MD  metoprolol tartrate (LOPRESSOR) 50 MG tablet Take 50 mg   2 hours before Coronary CT 08/25/20  Yes Martinique, Peter M, MD  predniSONE (STERAPRED UNI-PAK 21 TAB) 10 MG (21) TBPK tablet Take by mouth daily. Take 6 tabs by mouth daily  for 2 days, then 5 tabs for 2 days, then 4 tabs for 2 days, then 3 tabs for 2 days, 2 tabs for 2 days, then 1 tab by mouth daily for 2 days 07/08/21  Yes Odis Luster, FNP  lidocaine (LIDODERM) 5 % Place 1 patch onto the skin daily. Remove & Discard patch within 12 hours or as directed by MD 08/22/20   Aundra Dubin, PA-C  rosuvastatin (CRESTOR) 20 MG tablet Take 1 tablet (20 mg total) by mouth daily. 09/16/20 12/15/20  Martinique, Peter M, MD    Family History Family History  Problem Relation Age of Onset   Lung cancer Mother    Arthritis Mother        Rheumatoid   Bursitis Mother    Asthma Mother    Heart attack Father 26       Died of MI   Obesity Father    Fibromyalgia Sister    Asthma Child     Social History Social History   Tobacco Use   Smoking status: Some Days    Packs/day: 1.50    Years: 41.00    Pack years: 61.50    Types: Cigarettes    Start date: 1980   Smokeless tobacco: Never   Tobacco comments:    She had quit until COVID-19, She is smoking 4-5 cigarettes daily, 07/24/19  Vaping Use   Vaping Use: Never used  Substance Use Topics   Alcohol use: No   Drug use: No     Allergies   Clindamycin/lincomycin,  Cymbalta [duloxetine hcl], Levaquin [levofloxacin in d5w], Lyrica [pregabalin], Shellfish allergy, Sulfamethoxazole, and Codeine   Review of Systems Review of Systems Per HPI  Physical Exam Triage Vital Signs ED Triage Vitals  Enc Vitals Group     BP 07/08/21 1303 133/87     Pulse Rate 07/08/21 1303 73     Resp 07/08/21 1303 (!) 22     Temp 07/08/21 1303 98 F (36.7 C)     Temp Source 07/08/21 1303 Oral     SpO2 07/08/21 1303 90 %  Weight 07/08/21 1305 174 lb (78.9 kg)     Height 07/08/21 1305 '5\' 3"'$  (1.6 m)     Head Circumference --      Peak Flow --      Pain Score 07/08/21 1305 10     Pain Loc --      Pain Edu? --      Excl. in [place]? --    No data found.  Updated Vital Signs BP 133/87 (BP Location: Left Arm)   Pulse 73   Temp 98 F (36.7 C) (Oral)   Resp (!) 22   Ht '5\' 3"'$  (1.6 m)   Wt 174 lb (78.9 kg)   SpO2 90%   BMI 30.82 kg/m   Visual Acuity Right Eye Distance:   Left Eye Distance:   Bilateral Distance:    Right Eye Near:   Left Eye Near:    Bilateral Near:     Physical Exam Constitutional:      General: She is not in acute distress.    Appearance: Normal appearance.  HENT:     Head: Normocephalic and atraumatic.     Right Ear: Tympanic membrane and ear canal normal.     Left Ear: Tympanic membrane and ear canal normal.     Nose: Mucosal edema and congestion present.     Mouth/Throat:     Mouth: Mucous membranes are moist.     Pharynx: Posterior oropharyngeal erythema present.  Eyes:     Extraocular Movements: Extraocular movements intact.     Conjunctiva/sclera: Conjunctivae normal.     Pupils: Pupils are equal, round, and reactive to light.  Cardiovascular:     Rate and Rhythm: Normal rate and regular rhythm.     Pulses: Normal pulses.     Heart sounds: Normal heart sounds.  Pulmonary:     Effort: Pulmonary effort is normal. No respiratory distress.     Breath sounds: Normal breath sounds. No wheezing or rhonchi.  Abdominal:      General: Abdomen is flat. Bowel sounds are normal.     Palpations: Abdomen is soft.  Musculoskeletal:        General: Normal range of motion.     Cervical back: Normal range of motion.  Skin:    General: Skin is warm and dry.  Neurological:     General: No focal deficit present.     Mental Status: She is alert and oriented to person, place, and time. Mental status is at baseline.  Psychiatric:        Mood and Affect: Mood normal.        Behavior: Behavior normal.     UC Treatments / Results  Labs (all labs ordered are listed, but only abnormal results are displayed) Labs Reviewed  CULTURE, GROUP A STREP ([place])  NOVEL CORONAVIRUS, NAA  POCT RAPID STREP A (OFFICE)    EKG   Radiology DG Chest 2 View  Result Date: 07/08/2021 CLINICAL DATA:  Shortness of breath.  Nonproductive cough. EXAM: CHEST - 2 VIEW COMPARISON:  November 22, 2020 FINDINGS: No pneumothorax. Mild interstitial prominence. Suggested subtle opacity in the periphery of the left lung. The cardiomediastinal silhouette is stable. No other acute abnormalities. IMPRESSION: 1. Increased interstitial markings bilaterally with suggested subtle opacities in the periphery of the left lung suspicious for sequela of atypical infection. No other abnormalities. Electronically Signed   By: Dorise Bullion III M.D.   On: 07/08/2021 14:02    Procedures Procedures (including critical care time)  Medications Ordered  in UC Medications - No data to display  Initial Impression / Assessment and Plan / UC Course  I have reviewed the triage vital signs and the nursing notes.  Pertinent labs & imaging results that were available during my care of the patient were reviewed by me and considered in my medical decision making (see chart for details).     Advised patient that she needs to go to the hospital due to oxygen saturation in urgent care and at home, but patient refused.  Risks associated with not going to hospital were discussed  with patient.  Chest x-ray showing opacity in left lung that could indicate atypical infection.  Will treat with Augmentin antibiotic due to possible pneumonia and exposure to strep throat.  Also adding azithromycin antibiotic for atypical infection.  Will treat with prednisone steroid taper to decrease inflammation in chest.  Discussed over-the-counter medications that are safe to take for patient.  Advised patient to monitor pulse oximetry at home very closely and to go to the hospital if pulse oximetry decreases or shortness of breath worsens.  Rapid strep test was negative in urgent care today.  Throat culture and COVID-19 viral swab are pending.  Discussed strict return precautions. Patient verbalized understanding and is agreeable with plan.  Final Clinical Impressions(s) / UC Diagnoses   Final diagnoses:  Acute upper respiratory infection  Shortness of breath  Exposure to strep throat  Encounter for laboratory testing for COVID-19 virus  Pneumonia of left lung due to infectious organism, unspecified part of lung     Discharge Instructions      Your x-ray did show signs of pneumonia.  You are being treated with 2 antibiotics and a prednisone steroid taper.  Please monitor oxygen saturation very closely and go to the hospital if symptoms or shortness of breath worsen.     ED Prescriptions     Medication Sig Dispense Auth. Provider   amoxicillin-clavulanate (AUGMENTIN) 875-125 MG tablet Take 1 tablet by mouth every 12 (twelve) hours. 14 tablet Phillips Odor E, FNP   predniSONE (STERAPRED UNI-PAK 21 TAB) 10 MG (21) TBPK tablet Take by mouth daily. Take 6 tabs by mouth daily  for 2 days, then 5 tabs for 2 days, then 4 tabs for 2 days, then 3 tabs for 2 days, 2 tabs for 2 days, then 1 tab by mouth daily for 2 days 42 tablet Odis Luster, FNP   azithromycin (ZITHROMAX) 500 MG tablet Take 1 tablet (500 mg total) by mouth daily for 5 days. 5 tablet Odis Luster, FNP      PDMP not  reviewed this encounter.   Odis Luster, FNP 07/08/21 1440

## 2021-07-09 LAB — NOVEL CORONAVIRUS, NAA: SARS-CoV-2, NAA: NOT DETECTED

## 2021-07-09 LAB — SARS-COV-2, NAA 2 DAY TAT

## 2021-07-11 ENCOUNTER — Ambulatory Visit: Payer: Medicaid Other | Admitting: Obstetrics and Gynecology

## 2021-07-11 LAB — CULTURE, GROUP A STREP (THRC)

## 2021-07-12 NOTE — Progress Notes (Signed)
Patient sent home on augmentin and azithromycin. No change to therapy at this time.

## 2021-07-19 ENCOUNTER — Telehealth: Payer: Self-pay | Admitting: Orthopaedic Surgery

## 2021-07-19 DIAGNOSIS — R202 Paresthesia of skin: Secondary | ICD-10-CM | POA: Diagnosis not present

## 2021-07-19 DIAGNOSIS — M542 Cervicalgia: Secondary | ICD-10-CM | POA: Diagnosis not present

## 2021-07-19 DIAGNOSIS — M5459 Other low back pain: Secondary | ICD-10-CM | POA: Diagnosis not present

## 2021-07-19 NOTE — Telephone Encounter (Signed)
Received call from [facility] stating they have not received records. I advised records faxed 7/25. She gave me alternate fax number [phone], I refaxed records.

## 2021-08-01 DIAGNOSIS — F331 Major depressive disorder, recurrent, moderate: Secondary | ICD-10-CM | POA: Diagnosis not present

## 2021-08-01 DIAGNOSIS — F411 Generalized anxiety disorder: Secondary | ICD-10-CM | POA: Diagnosis not present

## 2021-08-24 ENCOUNTER — Ambulatory Visit: Payer: Medicaid Other | Admitting: Obstetrics and Gynecology

## 2021-08-24 DIAGNOSIS — F411 Generalized anxiety disorder: Secondary | ICD-10-CM | POA: Diagnosis not present

## 2021-08-24 DIAGNOSIS — F331 Major depressive disorder, recurrent, moderate: Secondary | ICD-10-CM | POA: Diagnosis not present

## 2021-09-01 ENCOUNTER — Encounter: Payer: Self-pay | Admitting: Physical Medicine and Rehabilitation

## 2021-09-01 ENCOUNTER — Encounter
Payer: Medicaid Other | Attending: Physical Medicine and Rehabilitation | Admitting: Physical Medicine and Rehabilitation

## 2021-09-01 ENCOUNTER — Other Ambulatory Visit: Payer: Self-pay

## 2021-09-01 VITALS — BP 124/85 | HR 84 | Temp 98.3°F | Ht 62.0 in | Wt 175.6 lb

## 2021-09-01 DIAGNOSIS — M792 Neuralgia and neuritis, unspecified: Secondary | ICD-10-CM | POA: Diagnosis not present

## 2021-09-01 DIAGNOSIS — G6282 Radiation-induced polyneuropathy: Secondary | ICD-10-CM | POA: Diagnosis not present

## 2021-09-01 DIAGNOSIS — M797 Fibromyalgia: Secondary | ICD-10-CM | POA: Insufficient documentation

## 2021-09-01 DIAGNOSIS — G629 Polyneuropathy, unspecified: Secondary | ICD-10-CM | POA: Insufficient documentation

## 2021-09-01 DIAGNOSIS — M545 Low back pain, unspecified: Secondary | ICD-10-CM | POA: Insufficient documentation

## 2021-09-01 DIAGNOSIS — G8929 Other chronic pain: Secondary | ICD-10-CM | POA: Insufficient documentation

## 2021-09-01 MED ORDER — OXCARBAZEPINE 300 MG PO TABS: 300.0000 mg | ORAL_TABLET | Freq: Every day | ORAL | 5 refills | Status: DC

## 2021-09-01 NOTE — Progress Notes (Signed)
Subjective:    Patient ID: Sheri Rivera, female    DOB: 04-14-1961, 60 y.o.   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   Here for evaluation of chronic pain.    Cancer treatments intensified everything with pain.   Neuropathy- Not on any nerve pain meds.   Allergic to Gabapentin throat swells up- as well as Lyrica and - cymbalta Got breathing problems with tramadol- ever since went to ER.  Also has problems with Tylenol #3- codeine  Water pressure on skin hurts so bad- thousand needles going into her.   Tried: Couldn't tell a difference with lidocaine cream and patches Keppra- has been prescribed- throat swells up Never heard of Trileptal.  Has had epidurals into back- not working.  Never tried low dose naltrexone  Leans over sink so can do dishes Too far to walk for anything to do anything significant- can use RW but it's "torture" to get there.   Burning in feet/neuropathy-      Social Hx: On Disability Has 2 wiener dogs- they are her fun, but overweight due to not walking.  Lives alone except dogs.   Pain Inventory Average Pain 10 Pain Right Now 10 My pain is constant, sharp, and burning  In the last 24 hours, has pain interfered with the following? General activity 10 Relation with others 10 Enjoyment of life 10 What TIME of day is your pain at its worst? morning , daytime, evening, and night Sleep (in general) Poor  Pain is worse with: walking, bending, sitting, standing, and some activites Pain improves with: heat/ice and medication Relief from Meds: 1  use a walker how many minutes can you walk? 2-3 ability to climb steps?  no do you drive?  yes  disabled: date disabled . I need assistance with the following:   shopping  weakness numbness tingling trouble walking spasms dizziness depression loss of taste or smell  New pt  New pt    Family History  Problem Relation Age of Onset   Lung cancer Mother    Arthritis Mother        Rheumatoid   Bursitis Mother    Asthma Mother    Heart attack Father 22       Died of MI   Obesity Father    Fibromyalgia Sister    Asthma Child    Social History   Socioeconomic History   Marital status: Divorced    Spouse name: Not on file   Number of children: 3   Years of education: Not on file   Highest education level: Not on file  Occupational History    Employer: [employer]  Tobacco Use   Smoking status: Some Days    Packs/day: 1.50    Years: 41.00    Pack years: 61.50    Types: Cigarettes    Start date: 1980   Smokeless tobacco: Never   Tobacco comments:    She had quit until COVID-19, She is smoking 4-5 cigarettes daily, 07/24/19  Vaping Use   Vaping Use: Never used  Substance and Sexual Activity   Alcohol use: No   Drug use: No   Sexual activity: Not on file  Other Topics Concern   Not on file  Social History Narrative  She lives by herself   Social Determinants of Health   Financial Resource Strain: Not on file  Food Insecurity: Not on file  Transportation Needs: Not on file  Physical Activity: Not on file  Stress: Not on file  Social Connections: Not on file   Past Surgical History:  Procedure Laterality Date   CHOLECYSTECTOMY     DIRECT LARYNGOSCOPY N/A 07/11/2018   Procedure: DIRECT LARYNGOSCOPY WITH BIOPSY;  Surgeon: Rozetta Nunnery, MD;  Location: [place];  Service: ENT;  Laterality: N/A;   HERNIA REPAIR     [place]    [place]   Past Medical History:  Diagnosis Date   Anxiety 11/11/11   Arthritis    Asthma    Bursitis    right shoulder   Chronic back pain    Chronic neck pain    Chronic shoulder pain    COPD (chronic obstructive  pulmonary disease) (HCC)    continues to smoke 1ppd   DDD (degenerative disc disease)    Depression    Fibromyalgia    GERD (gastroesophageal reflux disease)    Heart murmur    as a child per pt   Herpes    History of radiation therapy 08/12/18- 10/03/18   Larynx, prescribed dose of 63 Gy in 28 fractions. She chose to stop treatments after 27 fractions.    IBS (irritable bowel syndrome)    Lumbar radiculopathy    Seasonal allergies    Smoker    Thyroid nodule    goiters   BP 124/85   Pulse 84   Temp 98.3 F (36.8 C) (Oral)   Ht 5\' 2"  (1.575 m)   Wt 175 lb 9.6 oz (79.7 kg)   SpO2 93%   BMI 32.12 kg/m   Opioid Risk Score:   Fall Risk Score:  `1  Depression screen PHQ 2/9  No flowsheet data found.    Review of Systems  Musculoskeletal:  Positive for arthralgias, back pain, myalgias and neck pain.       Feet numbness Left buttocks pain down leg to knee area  Neurological:  Positive for dizziness, tremors, weakness and numbness.  Psychiatric/Behavioral:  Positive for dysphoric mood.   All other systems reviewed and are negative.     Objective:   Physical Exam  Awake, alert; very anxious; keeps standing and sitting, NAD Hoarse voice Cannot stay still-keeps standing/sitting.  Her skin is so sensitive- that she cannot allow me to touch her R knee is more swollen than L- pain with ROM of R knee- no crepitus.  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   Here for evaluation of chronic pain.    Cannot afford to do low dose naltrexone- only gets ~$850 /month and cannot afford it  2.    Will try Trileptal/Oxcarbemazine- - first- 300 mg nightly x1 week- 600 mg nightly x 1 week, then 900 mg nightly- for nerve pain/neuropathy;   3 Will need to check a lab in 1 month--BMP  to  check sodium levels. Will order to be checked in 1 month.  Can either get done at any [facility] or ask Dr Darron Doom to check, please.   4. We discussed Hydrocodone, but she doesn't want to be sedated, so will wait on this and see if Trileptal is helpful.    5. Feels like every joint she has is swollen- had prednisone last 2 months ago-  so doesn't feel comfortable prescribing. Will need to d/w PCP- and maybe send to Rheum.    6. F/U in 2-3 months-  And will discuss narcotics at next visit? > we can discuss SSRI's at next visit as well. TO increase pain threshold of the body.    7. Will do handicapped placard for pt.   I spent a total of 38 minutes on visit- discussing how to get pain better controlled and discussing options.

## 2021-09-01 NOTE — Patient Instructions (Addendum)
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   Here for evaluation of chronic pain.    Cannot afford to do low dose naltrexone- only gets ~$850 /month and cannot afford it  2.   Will try Trileptal/Oxcarbemazine- - first- 300 mg nightly x1 week- 600 mg nightly x 1 week, then 900 mg nightly- for nerve pain/neuropathy;   3 Will need to check a lab in 1 month--BMP  to check sodium levels. Will order to be checked in 1 month.  Can either get done at any [facility] or ask Dr Darron Doom to check, please.   4. We discussed Hydrocodone, but she doesn't want to be sedated, so will wait on this and see if Trileptal is helpful.    5. Feels like every joint she has is swollen- had prednisone last 2 months ago-  so doesn't feel comfortable prescribing. Will need to d/w PCP- and maybe send to Rheum.    6. F/U in 2-3 months-  And will discuss narcotics at next visit? > we can discuss SSRI's at next visit as well. TO increase pain threshold of the body.   7. Will do handicapped placard for pt.

## 2021-09-06 ENCOUNTER — Telehealth: Payer: Self-pay

## 2021-09-06 NOTE — Telephone Encounter (Signed)
After 30 minutes after taking Trileptal Sheri Rivera gets nauseated, has headaches, sometimes vomiting with sweats. When the medications wears off the next day she's better. Patient wants to to know if there is another medication option?   Patient okay at this time but still has the back pain.   Call back phone [phone].  Patient aware Dr. Dagoberto Ligas is not in the office this afternoon.

## 2021-09-08 ENCOUNTER — Telehealth: Payer: Self-pay

## 2021-09-08 DIAGNOSIS — G894 Chronic pain syndrome: Secondary | ICD-10-CM

## 2021-09-08 DIAGNOSIS — Z79891 Long term (current) use of opiate analgesic: Secondary | ICD-10-CM

## 2021-09-08 DIAGNOSIS — G8929 Other chronic pain: Secondary | ICD-10-CM

## 2021-09-08 DIAGNOSIS — Z5181 Encounter for therapeutic drug level monitoring: Secondary | ICD-10-CM

## 2021-09-08 DIAGNOSIS — M545 Low back pain, unspecified: Secondary | ICD-10-CM

## 2021-09-08 NOTE — Telephone Encounter (Signed)
Patient return call and stated she is not taking the medication because of how it made her head feel and the nausea. She stated she cannot deal with the pain and has been in bed 3 days.

## 2021-09-08 NOTE — Telephone Encounter (Signed)
Patient called and stated she had nausea with the Trileptal that was sent in. She wants to know if it can be replaced with something else. She stated she is having pain. Please advise

## 2021-09-08 NOTE — Telephone Encounter (Signed)
L/M on voicemail- it was pt's personal voicemail.  Explained as I told her in clinic- there are not many other choices for pain meds for her pain- I.e only opiates left and that won't help her nerve pain- so I'd like to try an antinausea medicine if possible instead of stopping Trileptal-  She is to call back to let us know- ML

## 2021-09-11 NOTE — Telephone Encounter (Signed)
Called to come in for a UDS and sign pain contract. She will come by tomorrow before 3 pm. Pain contract up front with urine tox order.

## 2021-09-12 ENCOUNTER — Ambulatory Visit (INDEPENDENT_AMBULATORY_CARE_PROVIDER_SITE_OTHER): Payer: Medicaid Other | Admitting: Otolaryngology

## 2021-09-12 DIAGNOSIS — Z5181 Encounter for therapeutic drug level monitoring: Secondary | ICD-10-CM | POA: Diagnosis not present

## 2021-09-12 DIAGNOSIS — Z79891 Long term (current) use of opiate analgesic: Secondary | ICD-10-CM | POA: Diagnosis not present

## 2021-09-12 DIAGNOSIS — G8929 Other chronic pain: Secondary | ICD-10-CM | POA: Diagnosis not present

## 2021-09-12 DIAGNOSIS — M545 Low back pain, unspecified: Secondary | ICD-10-CM | POA: Diagnosis not present

## 2021-09-12 DIAGNOSIS — G894 Chronic pain syndrome: Secondary | ICD-10-CM | POA: Diagnosis not present

## 2021-09-14 ENCOUNTER — Ambulatory Visit: Payer: Medicaid Other | Admitting: Neurology

## 2021-09-14 DIAGNOSIS — F331 Major depressive disorder, recurrent, moderate: Secondary | ICD-10-CM | POA: Diagnosis not present

## 2021-09-14 DIAGNOSIS — F411 Generalized anxiety disorder: Secondary | ICD-10-CM | POA: Diagnosis not present

## 2021-09-17 LAB — TOXASSURE SELECT,+ANTIDEPR,UR

## 2021-09-18 MED ORDER — HYDROCODONE-ACETAMINOPHEN 5-325 MG PO TABS: 1.0000 | ORAL_TABLET | Freq: Three times a day (TID) | ORAL | 0 refills | Status: AC | PRN

## 2021-09-18 NOTE — Telephone Encounter (Signed)
Notified of 7 day supply and to call us when she is about out of medication if it is working so Dr Dagoberto Ligas can send in full amount.

## 2021-09-18 NOTE — Addendum Note (Signed)
Addended by: Courtney Heys on: 09/18/2021 09:51 AM   Modules accepted: Orders

## 2021-09-19 ENCOUNTER — Telehealth: Payer: Self-pay | Admitting: *Deleted

## 2021-09-19 NOTE — Telephone Encounter (Signed)
Urine drug screen for this encounter is consistent for prescribed medication 

## 2021-09-20 ENCOUNTER — Ambulatory Visit (INDEPENDENT_AMBULATORY_CARE_PROVIDER_SITE_OTHER): Payer: Medicaid Other | Admitting: Otolaryngology

## 2021-09-20 ENCOUNTER — Other Ambulatory Visit: Payer: Self-pay

## 2021-09-20 ENCOUNTER — Encounter (INDEPENDENT_AMBULATORY_CARE_PROVIDER_SITE_OTHER): Payer: Self-pay

## 2021-09-28 ENCOUNTER — Telehealth: Payer: Self-pay

## 2021-09-28 MED ORDER — HYDROCODONE-ACETAMINOPHEN 5-325 MG PO TABS: 1.0000 | ORAL_TABLET | Freq: Four times a day (QID) | ORAL | 0 refills | Status: DC | PRN

## 2021-09-28 NOTE — Addendum Note (Signed)
Addended by: Courtney Heys on: 09/28/2021 07:32 PM   Modules accepted: Orders

## 2021-09-28 NOTE — Telephone Encounter (Signed)
Sheri Rivera has #2 tabs of Hydrocodone 5-325 MG on hand. Patient is calling in for a refill.   PMP REPORT:   Filled  Written  ID  Drug  QTY  Days  Prescriber  RX #  Dispenser  Refill  Daily Dose*  Pymt Type  PMP  09/18/2021 09/18/2021 1  Hydrocodone-Acetamin 5-325 Mg 21.00 7 Me Lov [id number] [facility] ([facility]) 0/0 15.00 MME

## 2021-09-29 ENCOUNTER — Telehealth: Payer: Self-pay

## 2021-09-29 NOTE — Telephone Encounter (Signed)
Patient notified that [place] sent to pharmacy.

## 2021-10-13 DIAGNOSIS — M7989 Other specified soft tissue disorders: Secondary | ICD-10-CM | POA: Diagnosis not present

## 2021-10-13 DIAGNOSIS — M722 Plantar fascial fibromatosis: Secondary | ICD-10-CM | POA: Diagnosis not present

## 2021-10-13 DIAGNOSIS — M7751 Other enthesopathy of right foot: Secondary | ICD-10-CM | POA: Diagnosis not present

## 2021-10-13 DIAGNOSIS — M7752 Other enthesopathy of left foot: Secondary | ICD-10-CM | POA: Diagnosis not present

## 2021-10-23 ENCOUNTER — Telehealth: Payer: Self-pay | Admitting: Physical Medicine and Rehabilitation

## 2021-10-23 DIAGNOSIS — J449 Chronic obstructive pulmonary disease, unspecified: Secondary | ICD-10-CM | POA: Diagnosis not present

## 2021-10-23 DIAGNOSIS — C320 Malignant neoplasm of glottis: Secondary | ICD-10-CM | POA: Diagnosis not present

## 2021-10-23 DIAGNOSIS — F411 Generalized anxiety disorder: Secondary | ICD-10-CM | POA: Diagnosis not present

## 2021-10-23 DIAGNOSIS — M797 Fibromyalgia: Secondary | ICD-10-CM | POA: Diagnosis not present

## 2021-10-23 MED ORDER — HYDROCODONE-ACETAMINOPHEN 5-325 MG PO TABS: 1.0000 | ORAL_TABLET | Freq: Four times a day (QID) | ORAL | 0 refills | Status: DC | PRN

## 2021-10-23 NOTE — Telephone Encounter (Signed)
Patient called and said is giving 72 hours notice she needs her RX filled - last filled 11/3 - runs out 12/3 - she knows we have problem with PA.  Her number [phone]

## 2021-11-21 ENCOUNTER — Telehealth: Payer: Self-pay | Admitting: *Deleted

## 2021-11-21 MED ORDER — HYDROCODONE-ACETAMINOPHEN 5-325 MG PO TABS: 1.0000 | ORAL_TABLET | Freq: Four times a day (QID) | ORAL | 0 refills | Status: DC | PRN

## 2021-11-21 NOTE — Telephone Encounter (Signed)
Sheri Rivera called to request a refill on her hydrocodone.  Last filled by PMP 10/24/21.

## 2021-11-22 ENCOUNTER — Encounter: Payer: Self-pay | Admitting: *Deleted

## 2021-11-23 ENCOUNTER — Telehealth: Payer: Self-pay | Admitting: Neurology

## 2021-11-23 ENCOUNTER — Ambulatory Visit (INDEPENDENT_AMBULATORY_CARE_PROVIDER_SITE_OTHER): Payer: Medicaid Other | Admitting: Neurology

## 2021-11-23 DIAGNOSIS — Z91199 Patient's noncompliance with other medical treatment and regimen due to unspecified reason: Secondary | ICD-10-CM

## 2021-11-23 NOTE — Progress Notes (Signed)
PATIENT NO-SHOWED New neurology patient appointments. This is a repeating pattern.Dismissed from practice.

## 2021-11-23 NOTE — Telephone Encounter (Signed)
Ms. Goatley has NO-SHOWED multiple new neurology patient appointments. This is a repeating pattern. Our staff personally spoke to her on the phone and confirmed appointment today and she still no showed.

## 2021-11-29 ENCOUNTER — Encounter: Payer: Self-pay | Admitting: Neurology

## 2021-12-01 ENCOUNTER — Encounter: Payer: Medicaid Other | Admitting: Physical Medicine and Rehabilitation

## 2021-12-04 ENCOUNTER — Encounter: Payer: Self-pay | Admitting: Neurology

## 2021-12-08 ENCOUNTER — Ambulatory Visit: Payer: Medicaid Other | Admitting: Orthopaedic Surgery

## 2021-12-19 ENCOUNTER — Ambulatory Visit: Payer: Medicaid Other | Admitting: Orthopaedic Surgery

## 2021-12-19 ENCOUNTER — Telehealth: Payer: Self-pay | Admitting: *Deleted

## 2021-12-19 MED ORDER — HYDROCODONE-ACETAMINOPHEN 5-325 MG PO TABS: 1.0000 | ORAL_TABLET | Freq: Four times a day (QID) | ORAL | 0 refills | Status: DC | PRN

## 2021-12-19 NOTE — Telephone Encounter (Signed)
Sheri Rivera called and is requesting a refill on her hydrocodone. Her next appt is 01/19/22 and her last fill date was 11/21/21.

## 2021-12-21 DIAGNOSIS — G8929 Other chronic pain: Secondary | ICD-10-CM | POA: Diagnosis not present

## 2021-12-21 DIAGNOSIS — M2550 Pain in unspecified joint: Secondary | ICD-10-CM | POA: Diagnosis not present

## 2021-12-21 DIAGNOSIS — M797 Fibromyalgia: Secondary | ICD-10-CM | POA: Diagnosis not present

## 2021-12-21 DIAGNOSIS — M5136 Other intervertebral disc degeneration, lumbar region: Secondary | ICD-10-CM | POA: Diagnosis not present

## 2021-12-29 DIAGNOSIS — F411 Generalized anxiety disorder: Secondary | ICD-10-CM | POA: Diagnosis not present

## 2021-12-29 DIAGNOSIS — F331 Major depressive disorder, recurrent, moderate: Secondary | ICD-10-CM | POA: Diagnosis not present

## 2022-01-12 NOTE — Progress Notes (Signed)
Oncology Nurse Navigator Documentation   Sheri Rivera called me reporting a new lump in her throat and pressure to her ear similar to when she was diagnosed with throat cancer in 2019. She asks to have me schedule her with an ENT because her previous ENT MD, Dr. Lucia Gaskins has retired. I called [facility] and was able to get her scheduled with Dr. Constance Holster on 2/23 at 1:50 pm. I called her back and left a voice mail with the date/time of the appointment and the address. I've asked her to call me back to verify that she received my message.   Harlow Asa RN, BSN, OCN Head & Neck Oncology Nurse [place] at [facility] Phone # [phone]  Fax # [phone]

## 2022-01-19 ENCOUNTER — Encounter
Payer: Medicaid Other | Attending: Physical Medicine and Rehabilitation | Admitting: Physical Medicine and Rehabilitation

## 2022-01-19 ENCOUNTER — Other Ambulatory Visit: Payer: Self-pay

## 2022-01-19 ENCOUNTER — Encounter: Payer: Self-pay | Admitting: Physical Medicine and Rehabilitation

## 2022-01-19 VITALS — BP 104/72 | HR 75 | Ht 62.0 in | Wt 180.0 lb

## 2022-01-19 DIAGNOSIS — F411 Generalized anxiety disorder: Secondary | ICD-10-CM | POA: Diagnosis not present

## 2022-01-19 DIAGNOSIS — G894 Chronic pain syndrome: Secondary | ICD-10-CM | POA: Diagnosis not present

## 2022-01-19 DIAGNOSIS — I2584 Coronary atherosclerosis due to calcified coronary lesion: Secondary | ICD-10-CM | POA: Diagnosis not present

## 2022-01-19 DIAGNOSIS — G64 Other disorders of peripheral nervous system: Secondary | ICD-10-CM | POA: Diagnosis not present

## 2022-01-19 DIAGNOSIS — R52 Pain, unspecified: Secondary | ICD-10-CM | POA: Insufficient documentation

## 2022-01-19 DIAGNOSIS — M797 Fibromyalgia: Secondary | ICD-10-CM | POA: Diagnosis not present

## 2022-01-19 DIAGNOSIS — G6282 Radiation-induced polyneuropathy: Secondary | ICD-10-CM | POA: Insufficient documentation

## 2022-01-19 DIAGNOSIS — Z79891 Long term (current) use of opiate analgesic: Secondary | ICD-10-CM | POA: Diagnosis not present

## 2022-01-19 DIAGNOSIS — Z5181 Encounter for therapeutic drug level monitoring: Secondary | ICD-10-CM | POA: Insufficient documentation

## 2022-01-19 DIAGNOSIS — C320 Malignant neoplasm of glottis: Secondary | ICD-10-CM | POA: Diagnosis not present

## 2022-01-19 MED ORDER — HYDROCODONE-ACETAMINOPHEN 5-325 MG PO TABS: 1.0000 | ORAL_TABLET | Freq: Four times a day (QID) | ORAL | 0 refills | Status: DC | PRN

## 2022-01-19 NOTE — Patient Instructions (Signed)
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  Has appt with ENT asap due to more lumps she can feel and also dx'd with psoriatic arthritis.    Here for f/u  of chronic pain.    Will increase Norco to 5/325 mg q6 hours as needed- #120- was due to renew today.   2.  Off Trileptal   3. Con't  regimen and moving regularly.   4. Let me know what ENT says.   5. F/U in 3 months.

## 2022-01-19 NOTE — Progress Notes (Signed)
Subjective:    Patient ID: Sheri Rivera, female    DOB: [birth year], 61 y.o.   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    Here for f/u  of chronic pain.   Hydrocodone is helping- makes it manageable.  With meds 4-6/10- but without meds 10/10. Feels like leg being cut off.   Wants to ask if can increase pain meds intermittent.   Wanted ot pull weeds and woke up at 3am due to pain meds.  Had to wait til 8:30am Ws taking ibuprofen- but messing up kidneys  and cannot take anymore.   Pain is much better controlled and much better to go through this winter due to pain meds.  Also dxd with psoriatic arthritis  Pain Inventory Average Pain 10 Pain Right Now 8 My pain is constant, burning, tingling, and aching  In the last 24 hours, has pain interfered with the following? General activity 2 Relation with others 0 Enjoyment of life 2 What TIME of day is your pain at its worst? morning , daytime, evening, and night Sleep (in general) Fair  Pain is worse with: walking, bending, sitting, and standing Pain improves with: medication Relief from Meds: 7  Family History  Problem Relation Age of Onset   Lung cancer Mother    Arthritis Mother        Rheumatoid   Bursitis Mother    Asthma Mother    Heart attack Father 90       Died of MI   Obesity Father    Fibromyalgia Sister    Asthma Child    Social History   Socioeconomic History   Marital status: Divorced    Spouse name: Not on file   Number of children: 3   Years of education: Not on file   Highest education level: Not on file  Occupational History    Employer: [employer]  Tobacco Use   Smoking status: Some Days    Packs/day: 1.50    Years: 41.00    Pack years: 61.50    Types:  Cigarettes    Start date: 1980   Smokeless tobacco: Never   Tobacco comments:    She had quit until COVID-19, She is smoking 4-5 cigarettes daily, 07/24/19  Vaping Use   Vaping Use: Never used  Substance and Sexual Activity   Alcohol use: No   Drug use: No   Sexual activity: Not on file  Other Topics Concern   Not on file  Social History Narrative   She lives by herself   Social Determinants of Health   Financial Resource Strain: Not on file  Food Insecurity: Not on file  Transportation Needs: Not on file  Physical Activity: Not on file  Stress: Not on file  Social Connections: Not on file   Past Surgical History:  Procedure Laterality Date   CHOLECYSTECTOMY     DIRECT LARYNGOSCOPY N/A 07/11/2018   Procedure: DIRECT LARYNGOSCOPY WITH BIOPSY;  Surgeon: Rozetta Nunnery, MD;  Location: [place];  Service: ENT;  Laterality: N/A;   HERNIA REPAIR     [facility]    [place]   Past Surgical History:  Procedure Laterality Date   CHOLECYSTECTOMY  DIRECT LARYNGOSCOPY N/A 07/11/2018   Procedure: DIRECT LARYNGOSCOPY WITH BIOPSY;  Surgeon: Rozetta Nunnery, MD;  Location: [place];  Service: ENT;  Laterality: N/A;   HERNIA REPAIR     [place]    HIATAL HERNIA REPAIR     TUBAL LIGATION  1983   Past Medical History:  Diagnosis Date   Anxiety 11/11/11   Arthritis    Asthma    Bursitis    right shoulder   Chronic back pain    Chronic neck pain    Chronic shoulder pain    COPD (chronic obstructive pulmonary disease) (HCC)    continues to smoke 1ppd   DDD (degenerative disc disease)    Depression    Fibromyalgia    GERD (gastroesophageal reflux disease)    Heart murmur    as a child per pt   Herpes    History of radiation therapy 08/12/18- 10/03/18   Larynx, prescribed dose of 63 Gy in 28 fractions. She chose to stop treatments after 27 fractions.    IBS (irritable bowel syndrome)    Lumbar  radiculopathy    Seasonal allergies    Smoker    Thyroid nodule    goiters   BP 104/72    Pulse 75    Ht 5\' 2"  (1.575 m)    Wt 180 lb (81.6 kg)    SpO2 97%    BMI 32.92 kg/m   Opioid Risk Score:   Fall Risk Score:  `1  Depression screen PHQ 2/9  Depression screen [facility] 2/9 01/19/2022 09/01/2021  Decreased Interest 1 3  Down, Depressed, Hopeless 0 2  PHQ - 2 Score 1 5  Altered sleeping - 1  Tired, decreased energy - 2  Change in appetite - 0  Feeling bad or failure about yourself  - 0  Trouble concentrating - 0  Moving slowly or fidgety/restless - 3  Suicidal thoughts - 0  PHQ-9 Score - 11  Difficult doing work/chores - Extremely dIfficult  Some recent data might be hidden     Review of Systems  Musculoskeletal:  Positive for back pain, myalgias and neck pain.  All other systems reviewed and are negative.     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  Has appt with ENT asap due to more lumps she can feel and also dx'd with psoriatic arthritis.    Here for f/u  of chronic pain.    Will increase Norco to 5/325 mg q6 hours as needed- #120- was due to renew today.   2.  Off Trileptal   3. Con't  regimen and moving regularly.   4. Let me know what ENT says.   5. F/U in 3 months.

## 2022-01-23 LAB — TOXASSURE SELECT,+ANTIDEPR,UR

## 2022-01-24 ENCOUNTER — Telehealth: Payer: Self-pay | Admitting: *Deleted

## 2022-01-24 NOTE — Telephone Encounter (Signed)
Urine drug screen for this encounter is consistent for prescribed medication 

## 2022-02-16 ENCOUNTER — Telehealth: Payer: Self-pay | Admitting: *Deleted

## 2022-02-16 MED ORDER — HYDROCODONE-ACETAMINOPHEN 5-325 MG PO TABS: 1.0000 | ORAL_TABLET | Freq: Four times a day (QID) | ORAL | 0 refills | Status: DC | PRN

## 2022-02-16 NOTE — Telephone Encounter (Signed)
Sheri Rivera called for refill on her hydrocodone. She has 3 pills left. Last fill date was 01/19/22. ?

## 2022-02-22 ENCOUNTER — Ambulatory Visit: Payer: Medicaid Other | Admitting: Surgery

## 2022-03-01 ENCOUNTER — Ambulatory Visit: Payer: Medicaid Other | Admitting: Surgery

## 2022-03-15 ENCOUNTER — Ambulatory Visit: Payer: Medicaid Other | Admitting: Surgery

## 2022-03-15 ENCOUNTER — Ambulatory Visit: Payer: Self-pay

## 2022-03-15 ENCOUNTER — Encounter: Payer: Self-pay | Admitting: Surgery

## 2022-03-15 VITALS — BP 131/77 | Ht 62.0 in | Wt 166.0 lb

## 2022-03-15 DIAGNOSIS — M545 Low back pain, unspecified: Secondary | ICD-10-CM | POA: Diagnosis not present

## 2022-03-15 DIAGNOSIS — M25512 Pain in left shoulder: Secondary | ICD-10-CM | POA: Diagnosis not present

## 2022-03-15 DIAGNOSIS — G8929 Other chronic pain: Secondary | ICD-10-CM

## 2022-03-15 DIAGNOSIS — M1611 Unilateral primary osteoarthritis, right hip: Secondary | ICD-10-CM

## 2022-03-15 DIAGNOSIS — M1612 Unilateral primary osteoarthritis, left hip: Secondary | ICD-10-CM

## 2022-03-15 DIAGNOSIS — R2689 Other abnormalities of gait and mobility: Secondary | ICD-10-CM

## 2022-03-15 NOTE — Progress Notes (Signed)
? ?Office Visit Note ?  ?Patient: Sheri Rivera           ?Date of Birth: Feb 05, 1961           ?MRN: [id number] ?Visit Date: 03/15/2022 ?             ?Requested by: Hayden Rasmussen, MD ?[address] ?[place],  [place] [address] ?PCP: Hayden Rasmussen, MD ? ? ?Assessment & Plan: ?Visit Diagnoses:  ?1. Decreased mobility   ?2. Chronic bilateral low back pain, unspecified whether sciatica present   ?3. Chronic left shoulder pain   ?4. Unilateral primary osteoarthritis, right hip   ?5. Unilateral primary osteoarthritis, left hip   ? ? ?Plan: With patient's ongoing chronic lumbar spine issues I will schedule lumbar MRI to rule out HNP/stenosis.  Patient will follow-up in the office with Dr. Lorin Mercy to discuss results and further treatment options.  When patient was last seen by Dr. Lorin Mercy he also referred patient to neurology.  She never scheduled appointments that that office was trying to make so she was discharged from that practice.  I did put in a another referral to a different office.  Stressed to patient the importance of being compliant visits when they call.  He will also discuss any potential treatment options for bilateral hips where she has DJD.  Right hip is more problematic.  Question trying intra-articular diagnostic/therapeutic injection.  In regards to her chronic left shoulder issues Dr. Lorin Mercy can also discuss next course of action with that.  She has seen Dr. Erlinda Hong in the past for both shoulders but voices that she does not want to see him again. ? ?Follow-Up Instructions: Return in about 5 years (around 03/16/2027) for with dr yates for review lumbar mri and recheck bilat hip djd and left shoulder.  .  ? ?Orders:  ?Orders Placed This Encounter  ?Procedures  ? XR Pelvis 1-2 Views  ? XR Lumbar Spine 2-3 Views  ? XR Shoulder Left  ? MR LUMBAR SPINE WO CONTRAST  ? Ambulatory referral to Neurology  ? ?No orders of the defined types were placed in this encounter. ? ? ? ? Procedures: ?No procedures  performed ? ? ?Clinical Data: ?No additional findings. ? ? ?Subjective: ?Chief Complaint  ?Patient presents with  ? Lower Back - Pain  ? Left Shoulder - Pain  ? ? ?HPI ?61 year old white female comes in today with complaints of back pain, lower extremity radiculopathy and "mobility" issues.  Patient was last seen by Dr. Lorin Mercy for similar complaints and she was referred to neurology.  Per her chart she never contacted the office when they reached out to her to schedule appointments and she was discharged from the practice before being seen.  She states that she did not have transportation at that time.  States that she has constant pain in her back and at times "legs do not work".  Also states that "do not feel my legs when they stand".  Takes a couple minutes for her to get going.  Patient sees pain management on [address] and has been going on the last 5 months.  She also states that she has pain in the right greater than left hip.  Also chronic pain in the left shoulder and has seen Dr. Erlinda Hong for the shoulders in the past.  She has had injections.  Pain with overhead activity voiced that she does not want to see Dr. Erlinda Hong currently taking chronic hydrocodone.  Again. ?  Review of Systems ?No current cardiopulmonary GI/GU issues ? ?Objective: ?Vital Signs: BP 131/77   Ht '5\' 2"'$  (1.575 m)   Wt 166 lb (75.3 kg)   BMI 30.36 kg/m?  ? ?Physical Exam ?HENT:  ?   Head: Normocephalic.  ?Eyes:  ?   Extraocular Movements: Extraocular movements intact.  ?Pulmonary:  ?   Effort: No respiratory distress.  ?Musculoskeletal:  ?   Comments: Gait is somewhat slow.  Bilateral lumbar paraspinal tenderness/spasm.  Some tenderness over the bilateral SI joints.  Right hip she has about 15 to 20 degrees internal/external rotation with groin pain.  No pain on the left hip.  Positive right straight leg raise.  No focal motor deficits.  Right shoulder good range of motion but with some discomfort.  ?Neurological:  ?   Mental Status: She is  alert and oriented to person, place, and time.  ?Psychiatric:     ?   Mood and Affect: Mood normal.  ? ? ?Ortho Exam ? ?Specialty Comments:  ?No specialty comments available. ? ?Imaging: ?No results found. ? ? ?PMFS History: ?Patient Active Problem List  ? Diagnosis Date Noted  ? Fibromyalgia 09/01/2021  ? Radiation induced neuropathy ([facility]) 09/01/2021  ? Nerve pain 09/01/2021  ? 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 Acute respiratory failure with hypoxia ([facility]) 08/03/2017  ? GERD (gastroesophageal reflux disease) 06/13/2016  ? Chronic obstructive pulmonary disease ([facility]) 06/13/2016  ? Current tobacco use 09/05/2015  ? History of fracture of vertebra 02/16/2015  ? Diffuse pain 04/03/2014  ? Hemorrhage, postmenopausal 12/29/2013  ? Chronic obstructive pulmonary disease with acute exacerbation ([facility]) 10/30/2013  ? COPD mixed type ([facility]) 03/30/2013  ? Abdominal pain, epigastric 02/25/2013  ? Dysphagia, unspecified(787.20) 02/25/2013  ? Gas 02/25/2013  ? Abdominal bloating 02/25/2013  ? Precordial pain 02/16/2013  ? Multinodular goiter 10/05/2012  ? 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 Bladder cystocele 07/21/2012  ? Clinical depression 11/11/2011  ? Allergic rhinitis 11/11/2011  ? URI (upper respiratory infection) 09/28/2011  ? Sore throat 07/18/2011  ? Neck swelling 06/22/2011  ? Ingrown right big toenail 05/23/2011  ? Chronic back pain 03/16/2011  ? 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/05/2010  ? Shortness of breath 03/15/2010  ? GOITER, MULTINODULAR 07/01/2009  ? FIBROMYALGIA 07/01/2009  ? ALLERGIC RHINITIS CAUSE UNSPECIFIED 03/02/2008  ? Anxiety 01/23/2007  ? TOBACCO DEPENDENCE 01/23/2007  ? GASTROESOPHAGEAL REFLUX, NO ESOPHAGITIS 01/23/2007  ? ?Past Medical History:  ?Diagnosis Date  ? Anxiety 11/11/11  ? Arthritis   ? Asthma   ? Bursitis   ? right shoulder  ? Chronic back pain   ? Chronic neck pain   ? Chronic shoulder pain   ? COPD (chronic obstructive pulmonary disease) ([facility])   ? continues to smoke  1ppd  ? DDD (degenerative disc disease)   ? Depression   ? Fibromyalgia   ? GERD (gastroesophageal reflux disease)   ? Heart murmur   ? as a child per pt  ? Herpes   ? History of radiation therapy 08/12/18- 10/03/18  ? Larynx, prescribed dose of 63 Gy in 28 fractions. She chose to stop treatments after 27 fractions.   ? IBS (irritable bowel syndrome)   ? Lumbar radiculopathy   ? Seasonal allergies   ? Smoker   ? Thyroid nodule   ? goiters  ?  ?Family History  ?Problem Relation Age of Onset  ? Lung cancer Mother   ? Arthritis Mother   ?     Rheumatoid  ?  Bursitis Mother   ? Asthma Mother   ? Heart attack Father 60  ?     Died of MI  ? Obesity Father   ? Fibromyalgia Sister   ? Asthma Child   ?  ?Past Surgical History:  ?Procedure Laterality Date  ? CHOLECYSTECTOMY    ? DIRECT LARYNGOSCOPY N/A 07/11/2018  ? Procedure: DIRECT LARYNGOSCOPY WITH BIOPSY;  Surgeon: Rozetta Nunnery, MD;  Location: [facility];  Service: ENT;  Laterality: N/A;  ? HERNIA REPAIR    ? Hiatal   ? HIATAL HERNIA REPAIR    ? TUBAL LIGATION  1983  ? ?Social History  ? ?Occupational History  ?  Employer: [employer]  ?Tobacco Use  ? Smoking status: Some Days  ?  Packs/day: 1.50  ?  Years: 41.00  ?  Pack years: 61.50  ?  Types: Cigarettes  ?  Start date: 76  ? Smokeless tobacco: Never  ? Tobacco comments:  ?  She had quit until COVID-19, She is smoking 4-5 cigarettes daily, 07/24/19  ?Vaping Use  ? Vaping Use: Never used  ?Substance and Sexual Activity  ? Alcohol use: No  ? Drug use: No  ? Sexual activity: Not on file  ? ? ? ? ? ? ?

## 2022-03-20 NOTE — Progress Notes (Deleted)
NEUROLOGY CONSULTATION NOTE  Sheri Rivera MRN: [id number] DOB: 07-08-1961  Referring provider: Benjiman Core, PA-C Primary care provider: Horald Pollen, MD  Reason for consult:  gait instability, bilateral leg weakness  Assessment/Plan:   ***   Subjective:  Sheri Rivera is a 61 year old female with COPD, arthritis, chronic neck/back/hip pain and IBS who presents for gait instability and lower extremities weakness.  History supplemented by orthopedics notes.  ***  Patient has longstanding history of chronic pain syndrome including degenerative spine disease of the cervical and lumbar spines.  MRI of lumbar spine on 04/15/2018 showed moderate right L5 and left L4 foraminal stenosis.  She has received epidural injections in the lumbar spine with ***.  She also has degenerative joint disease in both hips, right worse than left.     Imaging (personally reviewed): 04/15/2018 MRI L-SPINE W WO:  1. Unchanged moderate right L5 and left L4 foraminal stenosis.  2. No spinal canal stenosis or acute abnormality. 03/12/2018 MRI C-SPINE WO:  No significant change noted since the study of 2014. 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  Left-sided spondylosis and facet degeneration at C3-4 with left foraminal narrowing that could affect the left C4 nerve.  Left-sided spondylosis and facet degeneration at C5-6 with left foraminal narrowing that could affect the left C6 nerve. 03/12/2028 MRI L-SPINE WO (compared to MRI from 01/30/2015):  1. No acute osseous abnormality or malalignment. Stable mild L3 superior endplate chronic deformity. 2. Stable lumbar spondylosis greatest at the L4-5 and L5-S1 levels. 3. Mild left L4-5 as well as moderate right and mild left L5-S1 foraminal stenosis. 4. No high-grade foraminal or canal stenosis.  PAST MEDICAL HISTORY: Past Medical History:  Diagnosis Date   Anxiety 11/11/11   Arthritis    Asthma    Bursitis    right shoulder   Chronic back pain    Chronic  neck pain    Chronic shoulder pain    COPD (chronic obstructive pulmonary disease) ([facility])    continues to smoke 1ppd   DDD (degenerative disc disease)    Depression    Fibromyalgia    GERD (gastroesophageal reflux disease)    Heart murmur    as a child per pt   Herpes    History of radiation therapy 08/12/18- 10/03/18   Larynx, prescribed dose of 63 Gy in 28 fractions. She chose to stop treatments after 27 fractions.    IBS (irritable bowel syndrome)    Lumbar radiculopathy    Seasonal allergies    Smoker    Thyroid nodule    goiters    PAST SURGICAL HISTORY: Past Surgical History:  Procedure Laterality Date   CHOLECYSTECTOMY     DIRECT LARYNGOSCOPY N/A 07/11/2018   Procedure: DIRECT LARYNGOSCOPY WITH BIOPSY;  Surgeon: Rozetta Nunnery, MD;  Location: [facility];  Service: ENT;  Laterality: N/A;   HERNIA REPAIR     Hiatal    HIATAL HERNIA REPAIR     TUBAL LIGATION  1983    MEDICATIONS: Current Outpatient Medications on File Prior to Visit  Medication Sig Dispense Refill   albuterol (PROVENTIL HFA;VENTOLIN HFA) 108 (90 BASE) MCG/ACT inhaler Inhale 2 puffs into the lungs every 6 (six) hours as needed for shortness of breath. Shortness of breath     albuterol (PROVENTIL) (2.5 MG/3ML) 0.083% nebulizer solution Take 3 mLs (2.5 mg total) by nebulization every 4 (four) hours as needed for wheezing or shortness of breath. Shortness of breath 25 vial 0  amoxicillin (AMOXIL) 500 MG capsule Take 500 mg by mouth 3 (three) times daily.     beclomethasone (QVAR) 80 MCG/ACT inhaler Inhale into the lungs.     fluticasone (FLONASE) 50 MCG/ACT nasal spray Place 1 spray into both nostrils 2 (two) times daily.     HYDROcodone-acetaminophen (NORCO) 5-325 MG tablet Take 1 tablet by mouth every 6 (six) hours as needed for moderate pain. 120 tablet 0   ipratropium-albuterol (DUONEB) 0.5-2.5 (3) MG/3ML SOLN SMARTSIG:3 Milliliter(s) Via Nebulizer Every 6 Hours PRN     lidocaine  (LIDODERM) 5 % Place 1 patch onto the skin daily. Remove & Discard patch within 12 hours or as directed by MD 30 patch 0   LORazepam (ATIVAN) 0.5 MG tablet Take 1 tab PRN extreme anxiety, not to exceed once daily. Use sparingly. Future Rx only at the discretion of the primary care provider. 20 tablet 0   metoprolol tartrate (LOPRESSOR) 50 MG tablet Take 50 mg   2 hours before Coronary CT 1 tablet 0   predniSONE (STERAPRED UNI-PAK 21 TAB) 10 MG (21) TBPK tablet Take by mouth daily. Take 6 tabs by mouth daily  for 2 days, then 5 tabs for 2 days, then 4 tabs for 2 days, then 3 tabs for 2 days, 2 tabs for 2 days, then 1 tab by mouth daily for 2 days (Patient not taking: Reported on 03/15/2022) 42 tablet 0   rosuvastatin (CRESTOR) 20 MG tablet Take 1 tablet (20 mg total) by mouth daily. 30 tablet 6   No current facility-administered medications on file prior to visit.    ALLERGIES: Allergies  Allergen Reactions   Clindamycin/Lincomycin Anaphylaxis and Swelling   Cymbalta [Duloxetine Hcl] Anaphylaxis and Itching    Lips and throat swelled; stopped breathing   Levaquin [Levofloxacin In D5w] Anaphylaxis and Itching   Lyrica [Pregabalin] Shortness Of Breath and Swelling    Lips and throat swelled   Shellfish Allergy Anaphylaxis    Has EPI-PEN   Sulfamethoxazole Anaphylaxis   Codeine Anxiety    Sweating,nervous    FAMILY HISTORY: Family History  Problem Relation Age of Onset   Lung cancer Mother    Arthritis Mother        Rheumatoid   Bursitis Mother    Asthma Mother    Heart attack Father 67       Died of MI   Obesity Father    Fibromyalgia Sister    Asthma Child     Objective:  *** General: No acute distress.  Patient appears well-groomed.   Head:  Normocephalic/atraumatic Eyes:  fundi examined but not visualized Neck: supple, no paraspinal tenderness, full range of motion Back: No paraspinal tenderness Heart: regular rate and rhythm Lungs: Clear to auscultation  bilaterally. Vascular: No carotid bruits. Neurological Exam: Mental status: alert and oriented to person, place, and time, recent and remote memory intact, fund of knowledge intact, attention and concentration intact, speech fluent and not dysarthric, language intact. Cranial nerves: CN I: not tested CN II: pupils equal, round and reactive to light, visual fields intact CN III, IV, VI:  full range of motion, no nystagmus, no ptosis CN V: facial sensation intact. CN VII: upper and lower face symmetric CN VIII: hearing intact CN IX, X: gag intact, uvula midline CN XI: sternocleidomastoid and trapezius muscles intact CN XII: tongue midline Bulk & Tone: normal, no fasciculations. Motor:  muscle strength 5/5 throughout Sensation:  Pinprick, temperature and vibratory sensation intact. Deep Tendon Reflexes:  2+ throughout,  toes  downgoing.   Finger to nose testing:  Without dysmetria.   Heel to shin:  Without dysmetria.   Gait:  Normal station and stride.  Romberg negative.    Thank you for allowing me to take part in the care of this patient.  Metta Clines, DO  CC: ***

## 2022-03-21 ENCOUNTER — Ambulatory Visit: Payer: Medicaid Other | Admitting: Neurology

## 2022-03-22 ENCOUNTER — Telehealth: Payer: Self-pay

## 2022-03-22 NOTE — Telephone Encounter (Signed)
Patient is calling to request a refill on Hydrocodone. Per PMP, last fill was 02/19/22 ?

## 2022-03-23 DIAGNOSIS — J019 Acute sinusitis, unspecified: Secondary | ICD-10-CM | POA: Diagnosis not present

## 2022-03-23 DIAGNOSIS — J441 Chronic obstructive pulmonary disease with (acute) exacerbation: Secondary | ICD-10-CM | POA: Diagnosis not present

## 2022-03-23 DIAGNOSIS — I2584 Coronary atherosclerosis due to calcified coronary lesion: Secondary | ICD-10-CM | POA: Diagnosis not present

## 2022-03-23 DIAGNOSIS — M797 Fibromyalgia: Secondary | ICD-10-CM | POA: Diagnosis not present

## 2022-03-23 MED ORDER — HYDROCODONE-ACETAMINOPHEN 5-325 MG PO TABS: 1.0000 | ORAL_TABLET | Freq: Four times a day (QID) | ORAL | 0 refills | Status: DC | PRN

## 2022-03-23 NOTE — Addendum Note (Signed)
Addended by: Courtney Heys on: 03/23/2022 09:12 AM ? ? Modules accepted: Orders ? ?

## 2022-03-28 ENCOUNTER — Telehealth: Payer: Self-pay | Admitting: Orthopaedic Surgery

## 2022-03-28 NOTE — Telephone Encounter (Signed)
Called pt to schedule post MRI after 04/07/22 with MY per Benjiman Core notes ?

## 2022-03-29 DIAGNOSIS — F411 Generalized anxiety disorder: Secondary | ICD-10-CM | POA: Diagnosis not present

## 2022-03-29 DIAGNOSIS — F331 Major depressive disorder, recurrent, moderate: Secondary | ICD-10-CM | POA: Diagnosis not present

## 2022-04-07 ENCOUNTER — Other Ambulatory Visit: Payer: Medicaid Other

## 2022-04-09 ENCOUNTER — Other Ambulatory Visit: Payer: Medicaid Other

## 2022-04-17 ENCOUNTER — Telehealth: Payer: Self-pay

## 2022-04-17 MED ORDER — HYDROCODONE-ACETAMINOPHEN 5-325 MG PO TABS: 1.0000 | ORAL_TABLET | Freq: Four times a day (QID) | ORAL | 0 refills | Status: DC | PRN

## 2022-04-17 NOTE — Telephone Encounter (Signed)
Filled  Written  ID  Drug  QTY  Days  Prescriber  RX #  Dispenser  Refill  Daily Dose*  Pymt Type  PMP  04/07/2022 04/06/2022 1  Lorazepam 1 Mg Tablet 30.00 30 Ka Ric [id number] [facility] (0454) 0/0 1.00 LME Medicaid [place] 03/23/2022 03/23/2022 1  Hydrocodone-Acetamin 5-325 Mg 120.00 30 Me Lov [id number] [facility] (0531) 0/0 20.00 MME Medicaid [place]  Patient stated she will be out of Hydrocodone on 5/272023.

## 2022-04-21 ENCOUNTER — Ambulatory Visit
Admission: RE | Admit: 2022-04-21 | Discharge: 2022-04-21 | Disposition: A | Discharge status: Home / Self Care | Payer: Medicaid Other | Source: Ambulatory Visit | Attending: Surgery | Admitting: Surgery

## 2022-04-21 DIAGNOSIS — G8929 Other chronic pain: Secondary | ICD-10-CM

## 2022-04-21 DIAGNOSIS — R2689 Other abnormalities of gait and mobility: Secondary | ICD-10-CM

## 2022-04-30 ENCOUNTER — Encounter
Payer: Medicaid Other | Attending: Physical Medicine and Rehabilitation | Admitting: Physical Medicine and Rehabilitation

## 2022-04-30 ENCOUNTER — Encounter: Payer: Self-pay | Admitting: Physical Medicine and Rehabilitation

## 2022-04-30 VITALS — BP 144/81 | HR 67 | Ht 62.0 in | Wt 175.0 lb

## 2022-04-30 DIAGNOSIS — M797 Fibromyalgia: Secondary | ICD-10-CM

## 2022-04-30 DIAGNOSIS — M48061 Spinal stenosis, lumbar region without neurogenic claudication: Secondary | ICD-10-CM | POA: Insufficient documentation

## 2022-04-30 DIAGNOSIS — M48062 Spinal stenosis, lumbar region with neurogenic claudication: Secondary | ICD-10-CM | POA: Diagnosis present

## 2022-04-30 DIAGNOSIS — G6282 Radiation-induced polyneuropathy: Secondary | ICD-10-CM

## 2022-04-30 NOTE — Progress Notes (Signed)
Subjective:    Patient ID: Sheri Rivera, female    DOB: 02-13-1961, 61 y.o.   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  Has appt with ENT asap due to more lumps she can feel and also dx'd with psoriatic arthritis.  Here for f/u on pain.   Has good moments and bad days.   Was in bed sick so didn't meet with ENT- was scared of cancer coming back-  Still smoking 1/2 ppd.  Cannot use gum for nicotine- has no salivary glands and cannot use nicotine patches- causes rash/burns on skin.    Sees Dr Darron Doom tomorrow.    Pills help but mobility- is reducing.  On a rainy day- doesn't feel better. No matter what.  Even though takes pain meds.  Sets off FMS and DJD/arthritis.   MRI- of lumbar spine shows basically no difference.  Pain meds keep her functional.    Lost dog due to death This new dog is a puppy- and has to be on toes for him- less than 31 year old. And 66 yr old dog has perked up since lost 79 yr old dog since got new puppy.   Dog - Oliie- Getting  her out of bed on worst days. Part poodle and part Yorkie.    Lips swelled and wasn't breathing from Lyrica and Cymbalta and didn't do well with Trileptal.  Waits to take 4th dose til middle of night- when wakes up in middle of night to take.    Pain Inventory Average Pain 10 Pain Right Now 6 My pain is sharp, burning, stabbing, tingling, and aching  In the last 24 hours, has pain interfered with the following? General activity 5 Relation with others 0 Enjoyment of life 3 What TIME of day is your pain at its worst? morning , daytime, evening, and night Sleep (in general) Fair  Pain is worse with: walking, bending, sitting, and standing Pain improves with: heat/ice and medication Relief from  Meds: 7  Family History  Problem Relation Age of Onset   Lung cancer Mother    Arthritis Mother        Rheumatoid   Bursitis Mother    Asthma Mother    Heart attack Father 16       Died of MI   Obesity Father    Fibromyalgia Sister    Asthma Child    Social History   Socioeconomic History   Marital status: Divorced    Spouse name: Not on file   Number of children: 3   Years of education: Not on file   Highest education level: Not on file  Occupational History    Employer: [employer]  Tobacco Use   Smoking status: Some Days    Packs/day: 1.50    Years: 41.00    Pack years: 61.50    Types: Cigarettes    Start date: 1980   Smokeless tobacco: Never   Tobacco comments:    She had quit until COVID-19, She is smoking 4-5 cigarettes daily, 07/24/19  Vaping Use   Vaping Use: Never used  Substance and Sexual Activity   Alcohol use: No   Drug use: No   Sexual activity: Not on file  Other Topics Concern   Not  on file  Social History Narrative   She lives by herself   Social Determinants of Health   Financial Resource Strain: Not on file  Food Insecurity: Not on file  Transportation Needs: Not on file  Physical Activity: Not on file  Stress: Not on file  Social Connections: Not on file   Past Surgical History:  Procedure Laterality Date   CHOLECYSTECTOMY     DIRECT LARYNGOSCOPY N/A 07/11/2018   Procedure: DIRECT LARYNGOSCOPY WITH BIOPSY;  Surgeon: Rozetta Nunnery, MD;  Location: [facility];  Service: ENT;  Laterality: N/A;   HERNIA REPAIR     Hiatal    [place]   Past Surgical History:  Procedure Laterality Date   CHOLECYSTECTOMY     DIRECT LARYNGOSCOPY N/A 07/11/2018   Procedure: DIRECT LARYNGOSCOPY WITH BIOPSY;  Surgeon: Rozetta Nunnery, MD;  Location: [facility];  Service: ENT;  Laterality: N/A;   HERNIA REPAIR     Hiatal    [place]    Past Medical History:  Diagnosis Date   Anxiety 11/11/11   Arthritis    Asthma    Bursitis    right shoulder   Chronic back pain    Chronic neck pain    Chronic shoulder pain    COPD (chronic obstructive pulmonary disease) ([facility])    continues to smoke 1ppd   DDD (degenerative disc disease)    Depression    Fibromyalgia    GERD (gastroesophageal reflux disease)    Heart murmur    as a child per pt   Herpes    History of radiation therapy 08/12/18- 10/03/18   Larynx, prescribed dose of 63 Gy in 28 fractions. She chose to stop treatments after 27 fractions.    IBS (irritable bowel syndrome)    Lumbar radiculopathy    Seasonal allergies    Smoker    Thyroid nodule    goiters   BP (!) 144/81   Pulse 67   Ht '5\' 2"'$  (1.575 m)   Wt 175 lb (79.4 kg)   SpO2 92%   BMI 32.01 kg/m   Opioid Risk Score:   Fall Risk Score:  `1  Depression screen PHQ 2/9     01/19/2022    3:12 PM 09/01/2021    1:26 PM  Depression screen PHQ 2/9  Decreased Interest 1 3  Down, Depressed, Hopeless 0 2  PHQ - 2 Score 1 5  Altered sleeping  1  Tired, decreased energy  2  Change in appetite  0  Feeling bad or failure about yourself   0  Trouble concentrating  0  Moving slowly or fidgety/restless  3  Suicidal thoughts  0  PHQ-9 Score  11  Difficult doing work/chores  Extremely dIfficult      Review of Systems  Musculoskeletal:  Positive for back pain.       Left shoulder pain Right leg pain  All other systems reviewed and are negative.     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  Has  appt with ENT asap due to more lumps she can feel and also dx'd with psoriatic arthritis.    Also has lumbar stenosis. Seen on lumbar MRI.   Lumbar MRI looks about the same- which is significant DJD-   2. Can look at spinal cord stimulator- can google that - spinal cord stimulator for pain. Could hepl you get off pain meds.    3. Dr Mechele Dawley is the dotor I like to do this.   4. TENS unit- at drug store- or get on [place] try the sensation first- and you like it, then I can refer to Dr Mechele Dawley.    5. Doesn't want to spend life on pain meds.    6. F/U in 3 months, but call me to see how thinking on Spinal cord stimulator.   I spent a total of  21  minutes on total care today- >50% coordination of care- due to discussing spinal cord stimulator.

## 2022-04-30 NOTE — Patient Instructions (Signed)
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  Has appt with ENT asap due to more lumps she can feel and also dx'd with psoriatic arthritis.   Also has lumbar stenosis. Seen on lumbar MRI.   Lumbar MRI looks about the same- which is significant DJD-   2. Can look at spinal cord stimulator- can google that - spinal cord stimulator for pain. Could hepl you get off pain meds.    3. Dr Mechele Dawley is the dotor I like to do this.   4. TENS unit- at drug store- or get on [place] try the sensation first- and you like it, then I can refer to Dr Mechele Dawley.    5. Doesn't want to spend life on pain meds.    6. F/U in 3 months, but call me to see how thinking on Spinal cord stimulator.

## 2022-05-01 ENCOUNTER — Telehealth: Payer: Self-pay | Admitting: Orthopaedic Surgery
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
## 2022-05-21 ENCOUNTER — Telehealth: Payer: Self-pay | Admitting: *Deleted

## 2022-05-21 MED ORDER — HYDROCODONE-ACETAMINOPHEN 5-325 MG PO TABS: 1.0000 | ORAL_TABLET | Freq: Four times a day (QID) | ORAL | 0 refills | Status: DC | PRN

## 2022-06-15 ENCOUNTER — Ambulatory Visit (INDEPENDENT_AMBULATORY_CARE_PROVIDER_SITE_OTHER): Payer: Medicaid Other

## 2022-06-15 ENCOUNTER — Ambulatory Visit
Admission: RE | Admit: 2022-06-15 | Discharge: 2022-06-15 | Disposition: A | Discharge status: Home / Self Care | Payer: Medicaid Other | Source: Ambulatory Visit | Attending: Physician Assistant | Admitting: Physician Assistant

## 2022-06-15 DIAGNOSIS — M542 Cervicalgia: Secondary | ICD-10-CM

## 2022-06-15 DIAGNOSIS — M546 Pain in thoracic spine: Secondary | ICD-10-CM

## 2022-06-15 MED ORDER — TIZANIDINE HCL 4 MG PO TABS: 4.0000 mg | ORAL_TABLET | Freq: Four times a day (QID) | ORAL | 0 refills | Status: DC | PRN
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
## 2022-06-15 NOTE — Discharge Instructions (Addendum)
  Take muscle relaxer with caution. May cause drowsiness.  Please report to ED if symptoms fail to improve or worsen in any way.

## 2022-06-19 ENCOUNTER — Telehealth: Payer: Self-pay

## 2022-06-19 ENCOUNTER — Ambulatory Visit: Payer: Medicaid Other | Admitting: Orthopaedic Surgery

## 2022-06-19 NOTE — Telephone Encounter (Signed)
Patient is calling to get a refill on Hydrocodone. She needs it sent to [facility] on [address]. Per PMP, last fill was 05/22/22

## 2022-06-20 MED ORDER — HYDROCODONE-ACETAMINOPHEN 5-325 MG PO TABS: 1.0000 | ORAL_TABLET | Freq: Four times a day (QID) | ORAL | 0 refills | Status: DC | PRN

## 2022-06-20 NOTE — Telephone Encounter (Signed)
Left voicemail to inform her that medication was sent to pharmacy

## 2022-06-20 NOTE — Addendum Note (Signed)
Addended by: Courtney Heys on: 06/20/2022 08:48 AM   Modules accepted: Orders

## 2022-06-21 NOTE — Progress Notes (Signed)
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 She had called me in February reporting similar symptoms and I scheduled her an appointment at [facility] but she did not attend that appointment. I have returned her call today and left her a message with [facility]'s number and asked her to call them directly for an appointment.   Harlow Asa RN, BSN, OCN Head & Neck Oncology Nurse [place] [facility] Phone # [phone]  Fax # [phone]

## 2022-06-27 ENCOUNTER — Ambulatory Visit: Payer: Medicaid Other | Admitting: Neurology

## 2022-07-12 ENCOUNTER — Ambulatory Visit: Payer: Medicaid Other | Admitting: Surgery

## 2022-07-12 ENCOUNTER — Telehealth: Payer: Self-pay

## 2022-07-12 NOTE — Telephone Encounter (Signed)
Patient had called right after lunch and canceled her own appointment for today and will call back to R/S.

## 2022-07-12 NOTE — Telephone Encounter (Signed)
His first available would be 07/31/2022. Unfortunately, he is only here 1 1/2 days next week and then 1/2 day the next week due to helping Dr. Louanne Skye in surgery. He is overbooked at this point. If you would like to offer that appointment to her on 07/31/2022 and then send me the message back, I can keep an eye out to see if there is anywhere I could get her sooner if someone cancels.

## 2022-07-12 NOTE — Telephone Encounter (Signed)
FYI- see message below

## 2022-07-12 NOTE — Telephone Encounter (Signed)
Patient is scheduled to see Sheri Rivera this afternoon for ROV of her neck, but needs to see Dr. Lorin Mercy for MRI review of L-Spine.  I can call her to R/S, wasn't sure where you wanted to add her.  Please let me know.

## 2022-07-17 ENCOUNTER — Telehealth: Payer: Self-pay | Admitting: *Deleted

## 2022-07-17 ENCOUNTER — Ambulatory Visit: Payer: Medicaid Other | Admitting: Orthopaedic Surgery

## 2022-07-17 MED ORDER — HYDROCODONE-ACETAMINOPHEN 5-325 MG PO TABS: 1.0000 | ORAL_TABLET | Freq: Four times a day (QID) | ORAL | 0 refills | Status: DC | PRN

## 2022-07-17 NOTE — Telephone Encounter (Signed)
Sheri Rivera is calling for a refill on her hydrocodone to [facility] on [address]. Last fill date was 06/21/22

## 2022-07-20 NOTE — Progress Notes (Deleted)
Cardiology Office Note   Date:  07/20/2022   ID:  Sheri Rivera July 28, 1961, MRN [id number]  PCP:  Sheri Rasmussen, MD  Cardiologist:   Sheri Sena Martinique, MD   No chief complaint on file.     History of Present Illness: Sheri Rivera is a 61 y.o. female who is seen for follow up of coronary calcification and aortic atherosclerosis. She has a history of tobacco use, COPD and laryngeal CA. CT done for follow up of her CA showed the above findings. She subsequently had a coronary CTA in 2021 showing mild nonobstructive CAD.   She notes an ongoing history of tobacco abuse. She has a family history of early CAD with father dying at age 68 with MI.She did have a cardiac cath in 2002 showing no significant CAD. She does have DOE when walking her dogs. She has a lot of indigestion and chest pain that she attributes to her hiatal hernia.     Past Medical History:  Diagnosis Date   Anxiety 11/11/11   Arthritis    Asthma    Bursitis    right shoulder   Chronic back pain    Chronic neck pain    Chronic shoulder pain    COPD (chronic obstructive pulmonary disease) (HCC)    continues to smoke 1ppd   DDD (degenerative disc disease)    Depression    Fibromyalgia    GERD (gastroesophageal reflux disease)    Heart murmur    as a child per pt   Herpes    History of radiation therapy 08/12/18- 10/03/18   Larynx, prescribed dose of 63 Gy in 28 fractions. She chose to stop treatments after 27 fractions.    IBS (irritable bowel syndrome)    Lumbar radiculopathy    Seasonal allergies    Smoker    Thyroid nodule    goiters    Past Surgical History:  Procedure Laterality Date   CHOLECYSTECTOMY     DIRECT LARYNGOSCOPY N/A 07/11/2018   Procedure: DIRECT LARYNGOSCOPY WITH BIOPSY;  Surgeon: Sheri Nunnery, MD;  Location: [facility];  Service: ENT;  Laterality: N/A;   HERNIA REPAIR     Hiatal    HIATAL HERNIA REPAIR     TUBAL LIGATION  1983     Current  Outpatient Medications  Medication Sig Dispense Refill   albuterol (PROVENTIL HFA;VENTOLIN HFA) 108 (90 BASE) MCG/ACT inhaler Inhale 2 puffs into the lungs every 6 (six) hours as needed for shortness of breath. Shortness of breath     albuterol (PROVENTIL) (2.5 MG/3ML) 0.083% nebulizer solution Take 3 mLs (2.5 mg total) by nebulization every 4 (four) hours as needed for wheezing or shortness of breath. Shortness of breath 25 vial 0   amoxicillin (AMOXIL) 500 MG capsule Take 500 mg by mouth 3 (three) times daily.     amoxicillin-clavulanate (AUGMENTIN) 875-125 MG tablet Take 1 tablet by mouth 2 (two) times daily.     beclomethasone (QVAR) 80 MCG/ACT inhaler Inhale into the lungs.     fluticasone (FLONASE) 50 MCG/ACT nasal spray Place 1 spray into both nostrils 2 (two) times daily.     HYDROcodone-acetaminophen (NORCO) 5-325 MG tablet Take 1 tablet by mouth every 6 (six) hours as needed for moderate pain. 120 tablet 0   ipratropium-albuterol (DUONEB) 0.5-2.5 (3) MG/3ML SOLN SMARTSIG:3 Milliliter(s) Via Nebulizer Every 6 Hours PRN     lidocaine (LIDODERM) 5 % Place 1 patch onto the skin daily. Remove & Discard  patch within 12 hours or as directed by MD 30 patch 0   LORazepam (ATIVAN) 0.5 MG tablet Take 1 tab PRN extreme anxiety, not to exceed once daily. Use sparingly. Future Rx only at the discretion of the primary care provider. 20 tablet 0   LORazepam (ATIVAN) 1 MG tablet Take 0.5 mg by mouth 2 (two) times daily.     metoprolol tartrate (LOPRESSOR) 50 MG tablet Take 50 mg   2 hours before Coronary CT 1 tablet 0   predniSONE (DELTASONE) 20 MG tablet Take 20 mg by mouth daily.     predniSONE (STERAPRED UNI-PAK 21 TAB) 10 MG (21) TBPK tablet Take by mouth daily. Take 6 tabs by mouth daily  for 2 days, then 5 tabs for 2 days, then 4 tabs for 2 days, then 3 tabs for 2 days, 2 tabs for 2 days, then 1 tab by mouth daily for 2 days 42 tablet 0   rosuvastatin (CRESTOR) 20 MG tablet Take 1 tablet (20 mg total)  by mouth daily. 30 tablet 6   tiZANidine (ZANAFLEX) 4 MG tablet Take 1 tablet (4 mg total) by mouth every 6 (six) hours as needed for muscle spasms. 30 tablet 0   No current facility-administered medications for this visit.    Allergies:   Clindamycin, Clindamycin/lincomycin, Cymbalta [duloxetine hcl], Levaquin [levofloxacin in d5w], Levofloxacin, Pregabalin, Shellfish allergy, Sulfa antibiotics, Sulfamethoxazole, and Codeine    Social History:  The patient  reports that she has been smoking cigarettes. She started smoking about 43 years ago. She has a 61.50 pack-year smoking history. She has never used smokeless tobacco. She reports that she does not drink alcohol and does not use drugs.   Family History:  The patient's family history includes Arthritis in her mother; Asthma in her child and mother; Bursitis in her mother; Fibromyalgia in her sister; Heart attack (age of onset: 57) in her father; Lung cancer in her mother; Obesity in her father.    ROS:  Please see the history of present illness.   Otherwise, review of systems are positive for none.   All other systems are reviewed and negative.    PHYSICAL EXAM: VS:  There were no vitals taken for this visit. , BMI There is no height or weight on file to calculate BMI. GEN: Well nourished, well developed, in no acute distress  HEENT: normal  Neck: no JVD, carotid bruits, or masses Cardiac: RRR; no murmurs, rubs, or gallops,no edema  Respiratory:  clear to auscultation bilaterally, normal work of breathing GI: soft, nontender, nondistended, + BS MS: no deformity or atrophy  Skin: warm and dry, no rash Neuro:  Strength and sensation are intact Psych: euthymic mood, full affect   EKG:  EKG is ordered today. The ekg ordered today demonstrates NSR rate 66. Normal Ecg. I have personally reviewed and interpreted this study.    Recent Labs: No results found for requested labs within last 365 days.    Lipid Panel    Component Value  Date/Time   CHOL (H) 09/01/2009 0508    208        ATP III CLASSIFICATION:  <200     mg/dL   Desirable  200-239  mg/dL   Borderline High  >=240    mg/dL   High          TRIG 105 09/01/2009 0508   HDL 57 09/01/2009 0508   CHOLHDL 3.6 09/01/2009 0508   VLDL 21 09/01/2009 0508   LDLCALC (H) 09/01/2009  6237    130        Total Cholesterol/HDL:CHD Risk Coronary Heart Disease Risk Table                     Men   Women  1/2 Average Risk   3.4   3.3  Average Risk       5.0   4.4  2 X Average Risk   9.6   7.1  3 X Average Risk  23.4   11.0        Use the calculated Patient Ratio above and the CHD Risk Table to determine the patient's CHD Risk.        ATP III CLASSIFICATION (LDL):  <100     mg/dL   Optimal  100-129  mg/dL   Near or Above                    Optimal  130-159  mg/dL   Borderline  160-189  mg/dL   High  >190     mg/dL   Very High      Wt Readings from Last 3 Encounters:  04/30/22 175 lb (79.4 kg)  03/15/22 166 lb (75.3 kg)  01/19/22 180 lb (81.6 kg)      Other studies Reviewed: Additional studies/ records that were reviewed today include:   CT CHEST WITH CONTRAST   TECHNIQUE: Multidetector CT imaging of the chest was performed during intravenous contrast administration.   CONTRAST:  62m OMNIPAQUE IOHEXOL 300 MG/ML  SOLN   COMPARISON:  PET-CT 02/05/2019   FINDINGS: Cardiovascular: The heart size appears within normal limits. Aortic atherosclerosis. Coronary artery atherosclerotic calcifications. No pericardial effusion.   Mediastinum/Nodes: No enlarged mediastinal, hilar, or axillary lymph nodes. Thyroid gland, trachea, and esophagus demonstrate no significant findings.   Lungs/Pleura: Moderate to advanced centrilobular and paraseptal emphysema. No pleural effusion, airspace consolidation or atelectasis. No suspicious lung nodules identified.   Upper Abdomen: No acute abnormality.   Musculoskeletal: No chest wall abnormality. No acute or  significant osseous findings. Mild scoliosisease.   IMPRESSION: 1. No acute cardiopulmonary abnormalities. No specific findings identified to suggest residual metastatic disease. 2. Emphysema and aortic atherosclerosis. 3. Coronary artery calcifications.   Aortic Atherosclerosis (ICD10-I70.0) and Emphysema (ICD10-J43.9).     Electronically Signed   By: TKerby MoorsM.D.   On: 07/08/2020 10:52   ADDENDUM REPORT: 09/15/2020 11:48   CLINICAL DATA:  61yo female with CP   EXAM: Cardiac/Coronary  CT   TECHNIQUE: The patient was scanned on a [employer]   FINDINGS: A 120 kV prospective scan was triggered in the descending thoracic aorta at 111 HU's. Axial non-contrast 3 mm slices were carried out through the heart. The data set was analyzed on a dedicated work station and scored using the [place] rotation speed was 250 msecs and collimation was .6 mm. No beta blockade and 0.8 mg of sl NTG was given. The 3D data set was reconstructed in 5% intervals of the 67-82 % of the R-R cycle. Diastolic phases were analyzed on a dedicated work station using MPR, MIP and VRT modes. The patient received 80 cc of contrast.   Aorta:  Normal size.  No calcifications.  No dissection.   Aortic Valve:  Trileaflet.  No calcifications.   Coronary Arteries:  Normal coronary origin.  Left dominance.   RCA is a small nondominant artery.  There is no plaque.   Left main is a large artery  that gives rise to LAD and LCX arteries.   LAD is a large vessel; there is minimal (0-24%) calcified stenosis in the proximal vessel followed by minimal (0-24%) calcified stenoses in the proximal to mid and mid vessel.   LCX is a dominant artery that gives rise to large OM1, OM2 and PDA. There is minimal (0-24%) calcified stenosis in the proximal vessel.   Other findings:   Normal pulmonary vein drainage into the left atrium.   Normal let atrial appendage without a thrombus.    Normal size of the pulmonary artery.   IMPRESSION: 1. Coronary calcium score of 172. This was 59 percentile for age and sex matched control.   2. Normal coronary origin with left dominance.   3. Miinimal nonobstructive CAD; CAD-RADS 1.   Kirk Ruths     Electronically Signed   By: Kirk Ruths M.D.   On: 09/15/2020 11:48    Addended by Lelon Perla, MD on 09/15/2020 11:51 AM   Study Result  Narrative & Impression  EXAM: OVER-READ INTERPRETATION  CT CHEST   The following report is an over-read performed by radiologist Dr. Vinnie Langton of [facility], [place] on 09/15/2020. This over-read does not include interpretation of cardiac or coronary anatomy or pathology. The coronary calcium score/coronary CTA interpretation by the cardiologist is attached.   COMPARISON:  Chest CT 07/07/2020.   FINDINGS: Mild emphysematous changes are noted in the visualized lungs. Within the visualized portions of the thorax there are no suspicious appearing pulmonary nodules or masses, there is no acute consolidative airspace disease, no pleural effusions, no pneumothorax and no lymphadenopathy. Visualized portions of the upper abdomen are unremarkable. There are no aggressive appearing lytic or blastic lesions noted in the visualized portions of the skeleton.   IMPRESSION: 1.  Emphysema (ICD10-J43.9).   Electronically Signed: By: Vinnie Langton M.D. On: 09/15/2020 10:04      ASSESSMENT AND PLAN:  1.  Coronary calcification noted on CT. Has a left dominant system. Coronary CTA with mild nonobstructive CAD 2. Tobacco abuse. Counseled on cessation 3. Laryngeal CA s/p RT 4. Family history of premature CAD. 5. COPD   Current medicines are reviewed at length with the patient today.  The patient does not have concerns regarding medicines.  The following changes have been made:  no change  Labs/ tests ordered today include:   No orders of the defined types  were placed in this encounter.    Disposition:   FU with me pending results of above studies.  Signed, Doyal Saric Martinique, MD  07/20/2022 9:09 AM    [facility] [address] [place], [place], [address] Phone [phone], Fax [phone]

## 2022-07-26 ENCOUNTER — Ambulatory Visit: Payer: Medicaid Other | Admitting: Physician Assistant

## 2022-07-27 ENCOUNTER — Ambulatory Visit: Payer: Medicaid Other | Admitting: Cardiology

## 2022-08-08 ENCOUNTER — Encounter: Payer: Self-pay | Admitting: Surgery

## 2022-08-08 ENCOUNTER — Ambulatory Visit (INDEPENDENT_AMBULATORY_CARE_PROVIDER_SITE_OTHER): Payer: Medicare Other | Admitting: Surgery

## 2022-08-08 VITALS — BP 118/83 | HR 71 | Ht 62.0 in | Wt 175.0 lb

## 2022-08-08 DIAGNOSIS — M4722 Other spondylosis with radiculopathy, cervical region: Secondary | ICD-10-CM | POA: Diagnosis not present

## 2022-08-08 DIAGNOSIS — M545 Low back pain, unspecified: Secondary | ICD-10-CM

## 2022-08-08 DIAGNOSIS — G8929 Other chronic pain: Secondary | ICD-10-CM
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
## 2022-08-14 ENCOUNTER — Telehealth: Payer: Self-pay

## 2022-08-14 MED ORDER — HYDROCODONE-ACETAMINOPHEN 5-325 MG PO TABS: 1.0000 | ORAL_TABLET | Freq: Four times a day (QID) | ORAL | 0 refills | Status: DC | PRN

## 2022-08-14 NOTE — Telephone Encounter (Signed)
Patient is calling for a refill on Hydrocodone. Per PMP, last fill was 07/21/22. Can you please send in Dr. Florentina Jenny absence?

## 2022-08-14 NOTE — Telephone Encounter (Signed)
PMP was Reviewed.  Dr Dagoberto Ligas Note was reviewed.  Hydrocodone e-scribed today.  Call placed to Ms. Shima no answer, left message to return the call.

## 2022-08-15 ENCOUNTER — Ambulatory Visit: Payer: Medicaid Other | Admitting: Neurology

## 2022-08-16 ENCOUNTER — Ambulatory Visit: Payer: Medicare Other | Admitting: General Practice

## 2022-08-21 ENCOUNTER — Telehealth: Payer: Self-pay

## 2022-08-21 NOTE — Telephone Encounter (Signed)
PA for Hydrocodone sent through [facility] Tracks Confirmation #:[id number] W

## 2022-08-21 NOTE — Telephone Encounter (Signed)
Medication approved 08/21/22-02/17/23 through [facility]

## 2022-08-24 ENCOUNTER — Encounter: Payer: Medicare Other | Admitting: Physical Medicine and Rehabilitation

## 2022-08-29 ENCOUNTER — Encounter: Payer: Medicaid Other | Admitting: Physical Medicine and Rehabilitation

## 2022-09-04 ENCOUNTER — Encounter: Payer: Self-pay | Admitting: Orthopaedic Surgery

## 2022-09-04 ENCOUNTER — Ambulatory Visit (INDEPENDENT_AMBULATORY_CARE_PROVIDER_SITE_OTHER): Payer: Medicare Other | Admitting: Orthopaedic Surgery

## 2022-09-04 VITALS — BP 110/72 | HR 85 | Ht 62.0 in | Wt 164.0 lb

## 2022-09-04 DIAGNOSIS — M545 Low back pain, unspecified: Secondary | ICD-10-CM

## 2022-09-04 DIAGNOSIS — M542 Cervicalgia: Secondary | ICD-10-CM | POA: Diagnosis not present

## 2022-09-04 DIAGNOSIS — G8929 Other chronic pain: Secondary | ICD-10-CM

## 2022-09-04 DIAGNOSIS — M4722 Other spondylosis with radiculopathy, cervical region: Secondary | ICD-10-CM

## 2022-09-04 DIAGNOSIS — M48061 Spinal stenosis, lumbar region without neurogenic claudication: Secondary | ICD-10-CM
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
## 2022-09-04 NOTE — Progress Notes (Deleted)
Cardiology Clinic Note   Patient Name: Sheri Rivera Date of Encounter: 09/04/2022  Primary Care Provider:  Hayden Rasmussen, MD Primary Cardiologist:  None  Patient Profile    Sheri Rivera 61 year old female presents the clinic today for follow-up evaluation of her chest discomfort.  Past Medical History    Past Medical History:  Diagnosis Date   Anxiety 11/11/11   Arthritis    Asthma    Bursitis    right shoulder   Chronic back pain    Chronic neck pain    Chronic shoulder pain    COPD (chronic obstructive pulmonary disease) (HCC)    continues to smoke 1ppd   DDD (degenerative disc disease)    Depression    Fibromyalgia    GERD (gastroesophageal reflux disease)    Heart murmur    as a child per pt   Herpes    History of radiation therapy 08/12/18- 10/03/18   Larynx, prescribed dose of 63 Gy in 28 fractions. She chose to stop treatments after 27 fractions.    IBS (irritable bowel syndrome)    Lumbar radiculopathy    Seasonal allergies    Smoker    Thyroid nodule    goiters   Past Surgical History:  Procedure Laterality Date   CHOLECYSTECTOMY     DIRECT LARYNGOSCOPY N/A 07/11/2018   Procedure: DIRECT LARYNGOSCOPY WITH BIOPSY;  Surgeon: Rozetta Nunnery, MD;  Location: [facility];  Service: ENT;  Laterality: N/A;   HERNIA REPAIR     Hiatal    HIATAL HERNIA REPAIR     TUBAL LIGATION  1983    Allergies  Allergies  Allergen Reactions   Clindamycin Anaphylaxis and Swelling   Clindamycin/Lincomycin Anaphylaxis and Swelling   Cymbalta [Duloxetine Hcl] Anaphylaxis and Itching    Lips and throat swelled; stopped breathing   Levaquin [Levofloxacin In D5w] Anaphylaxis and Itching   Levofloxacin Shortness Of Breath   Pregabalin Shortness Of Breath, Swelling and Anaphylaxis    Lips and throat swelled Lips and throat swelled   Shellfish Allergy Anaphylaxis    Has EPI-PEN Has EPI-PEN   Sulfa Antibiotics Swelling   Sulfamethoxazole  Anaphylaxis   Codeine Anxiety    Sweating,nervous Sweating,nervous Sweating,nervous    History of Present Illness    Sheri Rivera has a PMH of allergic rhinitis, COPD, GERD, multinodular goiter, fibromyalgia, anxiety, tobacco dependence, shortness of breath, chronic back pain, precordial pain, and lumbar spinal stenosis.  She was previously noted to have coronary calcification and aortic atherosclerosis seen on CT.  She was seen by Dr. Martinique 08/10/2020.  During that time she was noted to have ongoing tobacco abuse and a family history of coronary artery disease her father died age 34.  Her cholesterol was unknown.  She did undergo cardiac catheterization in 2002 which showed no significant CAD.  She noted dyspnea with walking her dogs.  She also indicated that she had a lot of indigestion and chest pain which she attributed to her hiatal hernia.  Chest CT was reviewed.  Coronary CTA was ordered.  Lipid panel was also ordered.  Her coronary CTA was completed on 09/15/2020.  It showed a coronary calcium score of 172 which placed her in the 4 percentile for age and sex.  She was noted to have minimal nonobstructive CAD.  Coronary artery disease-no chest pain today.  Coronary CTA 10/21 showed minimal nonobstructive CAD. Continue metoprolol, rosuvastatin Heart healthy low-sodium diet-salty 6 given Increase physical activity as  tolerated  Essential hypertension-BP today*** Continue metoprolol Heart healthy low-sodium diet Increase physical activity as tolerated  Chest discomfort-previously felt to be atypical.  Notes increased dyspnea with increased physical activity.  History of COPD.  Denies exertional chest pain. Continue to monitor  Hyperlipidemia-LDL 130 09/01/2009 Continue rosuvastatin Heart healthy low-sodium diet-salty 6 given Increase physical activity as tolerated Follows with PCP  COPD-breathing at baseline.  Encouraged smoking cessation. Follows with PCP  Disposition:  Follow-up with Dr. Martinique or me in 9-12 months.  Home Medications    Prior to Admission medications   Medication Sig Start Date End Date Taking? Authorizing Provider  albuterol (PROVENTIL HFA;VENTOLIN HFA) 108 (90 BASE) MCG/ACT inhaler Inhale 2 puffs into the lungs every 6 (six) hours as needed for shortness of breath. Shortness of breath    [provider]  albuterol (PROVENTIL) (2.5 MG/3ML) 0.083% nebulizer solution Take 3 mLs (2.5 mg total) by nebulization every 4 (four) hours as needed for wheezing or shortness of breath. Shortness of breath 08/02/16   Molpus, Jenny Reichmann, MD  amoxicillin (AMOXIL) 500 MG capsule Take 500 mg by mouth 3 (three) times daily. 11/01/21   [provider]  amoxicillin-clavulanate (AUGMENTIN) 875-125 MG tablet Take 1 tablet by mouth 2 (two) times daily. 03/23/22   [provider]  beclomethasone (QVAR) 80 MCG/ACT inhaler Inhale into the lungs. 01/18/17   [provider]  fluticasone (FLONASE) 50 MCG/ACT nasal spray Place 1 spray into both nostrils 2 (two) times daily. 10/13/21   [provider]  HYDROcodone-acetaminophen (NORCO) 5-325 MG tablet Take 1 tablet by mouth every 6 (six) hours as needed for moderate pain. 08/14/22   Bayard Hugger, NP  ipratropium-albuterol (DUONEB) 0.5-2.5 (3) MG/3ML SOLN SMARTSIG:3 Milliliter(s) Via Nebulizer Every 6 Hours PRN 08/21/21   [provider]  lidocaine (LIDODERM) 5 % Place 1 patch onto the skin daily. Remove & Discard patch within 12 hours or as directed by MD 08/22/20   Aundra Dubin, PA-C  LORazepam (ATIVAN) 0.5 MG tablet Take 1 tab PRN extreme anxiety, not to exceed once daily. Use sparingly. Future Rx only at the discretion of the primary care provider. 11/07/18   Eppie Gibson, MD  LORazepam (ATIVAN) 1 MG tablet Take 0.5 mg by mouth 2 (two) times daily. 04/07/22   [provider]  metoprolol tartrate (LOPRESSOR) 50 MG tablet Take 50 mg   2 hours before Coronary CT 08/25/20    Martinique, Peter M, MD  predniSONE (DELTASONE) 20 MG tablet Take 20 mg by mouth daily. 03/23/22   [provider]  predniSONE (STERAPRED UNI-PAK 21 TAB) 10 MG (21) TBPK tablet Take by mouth daily. Take 6 tabs by mouth daily  for 2 days, then 5 tabs for 2 days, then 4 tabs for 2 days, then 3 tabs for 2 days, 2 tabs for 2 days, then 1 tab by mouth daily for 2 days 07/08/21   Teodora Medici, FNP  rosuvastatin (CRESTOR) 20 MG tablet Take 1 tablet (20 mg total) by mouth daily. 09/16/20 12/15/20  Martinique, Peter M, MD  tiZANidine (ZANAFLEX) 4 MG tablet Take 1 tablet (4 mg total) by mouth every 6 (six) hours as needed for muscle spasms. 06/15/22   Francene Finders, PA-C    Family History    Family History  Problem Relation Age of Onset   Lung cancer Mother    Arthritis Mother        Rheumatoid   Bursitis Mother    Asthma Mother  Heart attack Father 27       Died of MI   Obesity Father    Fibromyalgia Sister    Asthma Child    She indicated that her mother is deceased. She indicated that her father is deceased. She indicated that the status of her sister is unknown. She indicated that the status of her child is unknown.  Social History    Social History   Socioeconomic History   Marital status: Divorced    Spouse name: Not on file   Number of children: 3   Years of education: Not on file   Highest education level: Not on file  Occupational History    Employer: [employer]  Tobacco Use   Smoking status: Some Days    Packs/day: 1.50    Years: 41.00    Total pack years: 61.50    Types: Cigarettes    Start date: 1980   Smokeless tobacco: Never   Tobacco comments:    She had quit until COVID-19, She is smoking 4-5 cigarettes daily, 07/24/19  Vaping Use   Vaping Use: Never used  Substance and Sexual Activity   Alcohol use: No   Drug use: No   Sexual activity: Not on file  Other Topics Concern   Not on file  Social History Narrative   She lives by herself   Social  Determinants of Health   Financial Resource Strain: Not on file  Food Insecurity: Not on file  Transportation Needs: Unmet Transportation Needs (01/07/2019)   PRAPARE - Hydrologist (Medical): Yes    Lack of Transportation (Non-Medical): Not on file  Physical Activity: Not on file  Stress: Not on file  Social Connections: Not on file  Intimate Partner Violence: Not At Risk (07/29/2018)   Humiliation, Afraid, Rape, and Kick questionnaire    Fear of Current or Ex-Partner: No    Emotionally Abused: No    Physically Abused: No    Sexually Abused: No     Review of Systems    General:  No chills, fever, night sweats or weight changes.  Cardiovascular:  No chest pain, dyspnea on exertion, edema, orthopnea, palpitations, paroxysmal nocturnal dyspnea. Dermatological: No rash, lesions/masses Respiratory: No cough, dyspnea Urologic: No hematuria, dysuria Abdominal:   No nausea, vomiting, diarrhea, bright red blood per rectum, melena, or hematemesis Neurologic:  No visual changes, wkns, changes in mental status. All other systems reviewed and are otherwise negative except as noted above.  Physical Exam    VS:  There were no vitals taken for this visit. , BMI There is no height or weight on file to calculate BMI. GEN: Well nourished, well developed, in no acute distress. HEENT: normal. Neck: Supple, no JVD, carotid bruits, or masses. Cardiac: RRR, no murmurs, rubs, or gallops. No clubbing, cyanosis, edema.  Radials/DP/PT 2+ and equal bilaterally.  Respiratory:  Respirations regular and unlabored, clear to auscultation bilaterally. GI: Soft, nontender, nondistended, BS + x 4. MS: no deformity or atrophy. Skin: warm and dry, no rash. Neuro:  Strength and sensation are intact. Psych: Normal affect.  Accessory Clinical Findings    Recent Labs: No results found for requested labs within last 365 days.   Recent Lipid Panel    Component Value Date/Time   CHOL  (H) 09/01/2009 0508    208        ATP III CLASSIFICATION:  <200     mg/dL   Desirable  200-239  mg/dL   Borderline High  >=  240    mg/dL   High          TRIG 105 09/01/2009 0508   HDL 57 09/01/2009 0508   CHOLHDL 3.6 09/01/2009 0508   VLDL 21 09/01/2009 0508   LDLCALC (H) 09/01/2009 0508    130        Total Cholesterol/HDL:CHD Risk Coronary Heart Disease Risk Table                     Men   Women  1/2 Average Risk   3.4   3.3  Average Risk       5.0   4.4  2 X Average Risk   9.6   7.1  3 X Average Risk  23.4   11.0        Use the calculated Patient Ratio above and the CHD Risk Table to determine the patient's CHD Risk.        ATP III CLASSIFICATION (LDL):  <100     mg/dL   Optimal  100-129  mg/dL   Near or Above                    Optimal  130-159  mg/dL   Borderline  160-189  mg/dL   High  >190     mg/dL   Very High         ECG personally reviewed by me today- *** - No acute changes  Coronary CTA 09/15/2020  FINDINGS: A 120 kV prospective scan was triggered in the descending thoracic aorta at 111 HU's. Axial non-contrast 3 mm slices were carried out through the heart. The data set was analyzed on a dedicated work station and scored using the [place]. Gantry rotation speed was 250 msecs and collimation was .6 mm. No beta blockade and 0.8 mg of sl NTG was given. The 3D data set was reconstructed in 5% intervals of the 67-82 % of the R-R cycle. Diastolic phases were analyzed on a dedicated work station using MPR, MIP and VRT modes. The patient received 80 cc of contrast.   Aorta:  Normal size.  No calcifications.  No dissection.   Aortic Valve:  Trileaflet.  No calcifications.   Coronary Arteries:  Normal coronary origin.  Left dominance.   RCA is a small nondominant artery.  There is no plaque.   Left main is a large artery that gives rise to LAD and LCX arteries.   LAD is a large vessel; there is minimal (0-24%) calcified stenosis in the proximal  vessel followed by minimal (0-24%) calcified stenoses in the proximal to mid and mid vessel.   LCX is a dominant artery that gives rise to large OM1, OM2 and PDA. There is minimal (0-24%) calcified stenosis in the proximal vessel.   Other findings:   Normal pulmonary vein drainage into the left atrium.   Normal let atrial appendage without a thrombus.   Normal size of the pulmonary artery.   IMPRESSION: 1. Coronary calcium score of 172. This was 79 percentile for age and sex matched control.   2. Normal coronary origin with left dominance.   3. Miinimal nonobstructive CAD; CAD-RADS 1.   Sheri Rivera     Electronically Signed   By: Sheri Rivera M.D.   On: 09/15/2020 11:48  Assessment & Plan   1.  ***   Sheri Rivera     09/04/2022, 12:53 PM [address] Office [phone]  Fax [phone]  Notice: This dictation was prepared with Dragon dictation along with smaller phrase technology. Any transcriptional errors that result from this process are unintentional and may not be corrected upon review.  I spent***minutes examining this patient, reviewing medications, and using patient centered shared decision making involving her cardiac care.  Prior to her visit I spent greater than 20 minutes reviewing her past medical history,  medications, and prior cardiac tests.

## 2022-09-07 ENCOUNTER — Ambulatory Visit: Payer: Medicare Other | Admitting: General Practice

## 2022-09-14 ENCOUNTER — Telehealth: Payer: Self-pay | Admitting: Physical Medicine and Rehabilitation

## 2022-09-14 MED ORDER — HYDROCODONE-ACETAMINOPHEN 5-325 MG PO TABS: 1.0000 | ORAL_TABLET | Freq: Four times a day (QID) | ORAL | 0 refills | Status: DC | PRN

## 2022-09-14 NOTE — Telephone Encounter (Signed)
Pt. Requesting refill hydrocodone [facility] on [address] due on 10/26

## 2022-09-17 ENCOUNTER — Ambulatory Visit: Payer: Medicare Other
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
## 2022-09-21 ENCOUNTER — Ambulatory Visit (INDEPENDENT_AMBULATORY_CARE_PROVIDER_SITE_OTHER): Payer: Medicare Other | Admitting: Orthopaedic Surgery

## 2022-09-21 ENCOUNTER — Encounter: Payer: Self-pay | Admitting: Orthopaedic Surgery

## 2022-09-21 VITALS — BP 110/68 | HR 67 | Ht 63.0 in | Wt 164.0 lb

## 2022-09-21 DIAGNOSIS — M2550 Pain in unspecified joint: Secondary | ICD-10-CM | POA: Diagnosis not present

## 2022-09-21 DIAGNOSIS — G8929 Other chronic pain: Secondary | ICD-10-CM | POA: Diagnosis not present

## 2022-09-21 DIAGNOSIS — M48062 Spinal stenosis, lumbar region with neurogenic claudication: Secondary | ICD-10-CM | POA: Diagnosis not present

## 2022-09-21 DIAGNOSIS — M545 Low back pain, unspecified: Secondary | ICD-10-CM

## 2022-09-21 MED ORDER — CYCLOBENZAPRINE HCL 10 MG PO TABS: 10.0000 mg | ORAL_TABLET | Freq: Every day | ORAL | 1 refills | Status: DC

## 2022-09-21 MED ORDER — PREDNISONE 10 MG (21) PO TBPK: ORAL_TABLET | ORAL | 0 refills | Status: DC

## 2022-09-21 NOTE — Progress Notes (Signed)
Office Visit Note   Patient: Sheri Rivera           Date of Birth: [birth year]           MRN: [id number] Visit Date: 09/21/2022              Requested by: Hayden Rasmussen, MD [address] [place],  [place] [address] PCP: Hayden Rasmussen, MD   Assessment & Plan: Visit Diagnoses:  1. Multiple joint pain   2. Chronic bilateral low back pain without sciatica   3. Spinal stenosis of lumbar region with neurogenic claudication     Plan: We sent in some Flexeril she can take at night.  We discussed might be worthwhile to talk with her PCP about several month course of antidepressant which might help some with the pain.  We discussed some of the tricyclic effect of Flexeril.  We will proceed with therapy she can call us in several weeks if not making improvement we will proceed with cervical MRI scan to rule out some stenosis that could be giving her pain symptoms throughout her body.  We will obtain some labs with an arthritis panel and then make appropriate rheumatology referral if she has significant abnormalities.  Follow-Up Instructions: No follow-ups on file.   Orders:  Orders Placed This Encounter  Procedures   Antinuclear Antib (ANA)   Rheumatoid Factor   Sed Rate (ESR)   Uric acid   Meds ordered this encounter  Medications   cyclobenzaprine (FLEXERIL) 10 MG tablet    Sig: Take 1 tablet (10 mg total) by mouth at bedtime.    Dispense:  30 tablet    Refill:  1   predniSONE (STERAPRED UNI-PAK 21 TAB) 10 MG (21) TBPK tablet    Sig: Take 6,5,4,3,2,1 one tablet less each day with food.    Dispense:  21 tablet    Refill:  0      Procedures: No procedures performed   Clinical Data: No additional findings.   Subjective: Chief Complaint  Patient presents with   joint pain    HPI 61 year old female is here with plaque on the posterior aspect of her neck on the right side and ambulates with a forward flexed position.  She states she has fibromyalgia and  osteoarthritis and every single bone in her body is painful.  She is on pain management taking hydrocodone and ibuprofen states it is really not helping.  She has been on Flexeril in the past and is requesting a refill.  She states she cannot get upright low she uses a cane or a walker.  She has claudication symptoms and previous lumbar MRI scan showed moderate single level L4-5 stenosis.  Patient's pain in her neck radiates into her arms.  Patient has her first therapy visit coming up for both neck and back.  She has not had cervical MRI imaging in 4 years which did show some grade 1 anterolisthesis mid cervical.  Review of Systems no fever chills no associated bowel or bladder symptoms.   Objective: Vital Signs: BP 110/68   Pulse 67   Ht _0  (1.6 m)   Wt 164 lb (74.4 kg)   BMI 29.05 kg/m   Physical Exam Constitutional:      Appearance: She is well-developed.  HENT:     Head: Normocephalic.     Right Ear: External ear normal.     Left Ear: External ear normal. There is no impacted cerumen.  Eyes:  Pupils: Pupils are equal, round, and reactive to light.  Neck:     Thyroid: No thyromegaly.     Trachea: No tracheal deviation.  Cardiovascular:     Rate and Rhythm: Normal rate.  Pulmonary:     Effort: Pulmonary effort is normal.  Abdominal:     Palpations: Abdomen is soft.  Musculoskeletal:     Cervical back: No rigidity.  Skin:    General: Skin is warm and dry.  Neurological:     Mental Status: She is alert and oriented to person, place, and time.  Psychiatric:        Behavior: Behavior normal.     Ortho Exam negative impingement upper and lower extremity reflexes are 2+ symmetrical negative logroll the hips knees reach full extension.  She does have brachial plexus tenderness both right and left.  Tenderness over the long head of the biceps right left, coracoid, lateral epicondyles.  Pes bursa is tender with palpation.  Specialty Comments:  No specialty comments  available.  Imaging: No results found.   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   Acute respiratory failure with hypoxia ([place] [facility] [place]) 08/03/2017   GERD (gastroesophageal reflux disease) 06/13/2016   Chronic obstructive pulmonary disease ([place]) 06/13/2016   Current tobacco use 09/05/2015   History of fracture of vertebra 02/16/2015   Diffuse pain 04/03/2014   Hemorrhage, postmenopausal 12/29/2013   Chronic obstructive pulmonary disease with acute exacerbation ([place]) 10/30/2013   COPD mixed type ([place]) 03/30/2013   Abdominal pain, epigastric 02/25/2013   Dysphagia, unspecified(787.20) 02/25/2013   Gas 02/25/2013   Abdominal bloating 02/25/2013   Precordial pain 02/16/2013   Multinodular goiter 10/05/2012   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   Bladder cystocele 07/21/2012   Clinical depression 11/11/2011   Allergic rhinitis 11/11/2011   URI (upper respiratory infection) 09/28/2011   Sore throat 07/18/2011   Neck swelling 06/22/2011   Ingrown right big toenail 05/23/2011   Chronic back pain 03/16/2011   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/05/2010   Shortness of breath 03/15/2010   GOITER, MULTINODULAR 07/01/2009   FIBROMYALGIA 07/01/2009   ALLERGIC RHINITIS CAUSE UNSPECIFIED 03/02/2008   Anxiety 01/23/2007   TOBACCO DEPENDENCE 01/23/2007   GASTROESOPHAGEAL REFLUX, NO ESOPHAGITIS 01/23/2007   Past Medical History:  Diagnosis Date   Anxiety 11/11/11   Arthritis    Asthma    Bursitis    right shoulder   Chronic back pain    Chronic neck pain    Chronic shoulder pain    COPD (chronic obstructive pulmonary disease) ([facility])    continues to smoke 1ppd   DDD (degenerative disc disease)    Depression    Fibromyalgia    GERD  (gastroesophageal reflux disease)    Heart murmur    as a child per pt   Herpes    History of radiation therapy 08/12/18- 10/03/18   Larynx, prescribed dose of 63 Gy in 28 fractions. She chose to stop treatments after 27 fractions.    IBS (irritable bowel syndrome)    Lumbar radiculopathy    Seasonal allergies    Smoker    Thyroid nodule    goiters    Family History  Problem Relation Age of Onset   Lung cancer Mother    Arthritis Mother  Rheumatoid   Bursitis Mother    Asthma Mother    Heart attack Father 58       Died of MI   Obesity Father    Fibromyalgia Sister    Asthma Child     Past Surgical History:  Procedure Laterality Date   CHOLECYSTECTOMY     DIRECT LARYNGOSCOPY N/A 07/11/2018   Procedure: DIRECT LARYNGOSCOPY WITH BIOPSY;  Surgeon: Rozetta Nunnery, MD;  Location: [facility];  Service: ENT;  Laterality: N/A;   HERNIA REPAIR     Hiatal    HIATAL HERNIA REPAIR     TUBAL LIGATION  1983   Social History   Occupational History    Employer: [employer]  Tobacco Use   Smoking status: Some Days    Packs/day: 1.50    Years: 41.00    Total pack years: 61.50    Types: Cigarettes    Start date: 1980   Smokeless tobacco: Never   Tobacco comments:    She had quit until COVID-19, She is smoking 4-5 cigarettes daily, 07/24/19  Vaping Use   Vaping Use: Never used  Substance and Sexual Activity   Alcohol use: No   Drug use: No   Sexual activity: Not on file

## 2022-09-22 LAB — ANA: Anti Nuclear Antibody (ANA): NEGATIVE

## 2022-09-22 LAB — URIC ACID: Uric Acid, Serum: 4 mg/dL (ref 2.5–7.0)

## 2022-09-22 LAB — RHEUMATOID FACTOR: Rheumatoid fact SerPl-aCnc: <14 IU/mL (ref <14)

## 2022-09-22 LAB — SEDIMENTATION RATE: Sed Rate: 17 mm/h (ref 0–30)

## 2022-09-24 NOTE — Therapy (Incomplete)
OUTPATIENT PHYSICAL THERAPY THORACOLUMBAR EVALUATION   Patient Name: Sheri Rivera MRN: [id number] DOB:19-Dec-1960, 61 y.o., female Today's Date: 09/24/2022    Past Medical History:  Diagnosis Date   Anxiety 11/11/11   Arthritis    Asthma    Bursitis    right shoulder   Chronic back pain    Chronic neck pain    Chronic shoulder pain    COPD (chronic obstructive pulmonary disease) ([place])    continues to smoke 1ppd   DDD (degenerative disc disease)    Depression    Fibromyalgia    GERD (gastroesophageal reflux disease)    Heart murmur    as a child per pt   Herpes    History of radiation therapy 08/12/18- 10/03/18   Larynx, prescribed dose of 63 Gy in 28 fractions. She chose to stop treatments after 27 fractions.    IBS (irritable bowel syndrome)    Lumbar radiculopathy    Seasonal allergies    Smoker    Thyroid nodule    goiters   Past Surgical History:  Procedure Laterality Date   CHOLECYSTECTOMY     DIRECT LARYNGOSCOPY N/A 07/11/2018   Procedure: DIRECT LARYNGOSCOPY WITH BIOPSY;  Surgeon: Rozetta Nunnery, MD;  Location: [facility];  Service: ENT;  Laterality: N/A;   HERNIA REPAIR     [place]    [place]   Patient Active Problem List   Diagnosis Date Noted   Neck pain 09/04/2022   Spinal stenosis of lumbar region 04/30/2022   Fibromyalgia 09/01/2021   Radiation induced neuropathy ([place]) 09/01/2021   Nerve pain 09/01/2021   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   Acute respiratory failure with hypoxia ([place]) 08/03/2017   GERD (gastroesophageal reflux disease) 06/13/2016   Chronic obstructive pulmonary disease ([place]) 06/13/2016   Current tobacco use 09/05/2015   History of fracture of vertebra 02/16/2015   Diffuse pain 04/03/2014   Hemorrhage, postmenopausal 12/29/2013   Chronic obstructive pulmonary disease with acute exacerbation ([place]) 10/30/2013   COPD mixed type ([place]) 03/30/2013    Abdominal pain, epigastric 02/25/2013   Dysphagia, unspecified(787.20) 02/25/2013   Gas 02/25/2013   Abdominal bloating 02/25/2013   Precordial pain 02/16/2013   Multinodular goiter 10/05/2012   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   Bladder cystocele 07/21/2012   Clinical depression 11/11/2011   Allergic rhinitis 11/11/2011   URI (upper respiratory infection) 09/28/2011   Sore throat 07/18/2011   Neck swelling 06/22/2011   Ingrown right big toenail 05/23/2011   Chronic back pain 03/16/2011   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/05/2010   Shortness of breath 03/15/2010   GOITER, MULTINODULAR 07/01/2009   FIBROMYALGIA 07/01/2009   ALLERGIC RHINITIS CAUSE UNSPECIFIED 03/02/2008   Anxiety 01/23/2007   TOBACCO DEPENDENCE 01/23/2007   GASTROESOPHAGEAL REFLUX, NO ESOPHAGITIS 01/23/2007    PCP: Hayden Rasmussen, MD  REFERRING PROVIDER: Marybelle Killings, MD  REFERRING DIAG: M54.50,G89.29 (ICD-10-CM) - Chronic bilateral low back pain, unspecified whether sciatica present M47.22 (ICD-10-CM) - Other spondylosis with radiculopathy, cervical region  Rationale for Evaluation and Treatment: Rehabilitation  THERAPY DIAG:  No diagnosis found.  ONSET DATE: ***  SUBJECTIVE:  SUBJECTIVE STATEMENT: ***  PERTINENT HISTORY:   COPD, hx cancer, GERD, fibromyalgia, anxiety/depression, tobacco use, hx vertebral fracture  PAIN:  Are you having pain: *** Location: *** How would you describe your pain? *** Best in past week: *** Worst in past week: *** Aggravating factors: *** Easing factors: ***   PRECAUTIONS: {Therapy precautions:24002}  WEIGHT BEARING RESTRICTIONS: {Yes ***/No:24003}  FALLS:  Has patient fallen in last 6 months? {fallsyesno:27318}  LIVING ENVIRONMENT: ***  OCCUPATION: ***  PLOF:  {PLOF:24004}  PATIENT GOALS: ***   OBJECTIVE:   DIAGNOSTIC FINDINGS:  Cervical XR 06/15/22:  "IMPRESSION: 1. Grade 1 anterolisthesis of C5 on C6 is unchanged. 2. No definite acute fracture is visualized. 3. Mild C5-6 and C6-7 degenerative disc changes."  Thoracic XR 06/15/22:  "IMPRESSION: 1. No acute displaced fracture or traumatic listhesis of the thoracic spine. Limited evaluation due to overlapping osseous structures and overlying soft tissues. 2. At least trace bilateral pleural effusions. 3.  Emphysema (ICD10-J43.9)."  Lumbar MRI 04/24/22:  "IMPRESSION: Mild degenerative change L2-3 and L3-4 without significant stenosis   Left foraminal disc protrusion L4-5 with moderate left foraminal narrowing unchanged from the prior study. Moderate central canal stenosis with mild progression from 2019   Right foraminal encroachment L5-S1 due to disc and osteophyte complex, unchanged"    PATIENT SURVEYS:  FOTO:   SCREENING FOR RED FLAGS: Unremarkable red flag questioning  COGNITION: Overall cognitive status: Within functional limits for tasks assessed     SENSATION: Light touch intact all extremities  POSTURE: {posture:25561}  PALPATION: ***  LUMBAR ROM:   AROM eval  Flexion   Extension   Right lateral flexion   Left lateral flexion   Right rotation   Left rotation    (Blank rows = not tested)  LOWER EXTREMITY ROM:     Active  Right eval Left eval  Hip flexion    Hip extension    Hip internal rotation    Hip external rotation     (Blank rows = not tested)  Comments:    LOWER EXTREMITY MMT:    MMT Right eval Left eval  Hip flexion    Hip abduction (modified sitting)    Hip internal rotation    Hip external rotation    Knee flexion    Knee extension     (Blank rows = not tested)  Comments:   LUMBAR SPECIAL TESTS:  {lumbar special test:25242}  FUNCTIONAL TESTS:  {Functional tests:24029}  GAIT: Distance walked: within  clinic Assistive device utilized: {Assistive devices:23999} Level of assistance: Modified independence Comments: ***  TODAY'S TREATMENT:                                                                                                                              [facility] Adult PT Treatment:                     DATE: 09/25/22 Therapeutic Exercise: *** Manual Therapy: ***  PATIENT EDUCATION:  Education details: Pt education on PT impairments, prognosis, and POC. Informed consent. Rationale for interventions, safe/appropriate HEP performance Person educated: Patient Education method: Explanation, Demonstration, Tactile cues, Verbal cues, and Handouts Education comprehension: verbalized understanding, returned demonstration, verbal cues required, tactile cues required, and needs further education   HOME EXERCISE PROGRAM: ***  ASSESSMENT:  CLINICAL IMPRESSION: Pt is a 61 year old woman who arrives to PT evaluation on this date for chronic multi joint pain. Pt reports difficulty with *** due to pain. During today's session pt demonstrates *** which are limiting ability to perform aforementioned activities. Recommend skilled PT to address aforementioned deficits to improve functional independence/tolerance.   OBJECTIVE IMPAIRMENTS: {opptimpairments:25111}.   ACTIVITY LIMITATIONS: {activitylimitations:27494}  PARTICIPATION LIMITATIONS: {participationrestrictions:25113}  PERSONAL FACTORS: {Personal factors:25162} are also affecting patient's functional outcome.   REHAB POTENTIAL: {rehabpotential:25112}  CLINICAL DECISION MAKING: {clinical decision making:25114}  EVALUATION COMPLEXITY: {Evaluation complexity:25115}   GOALS: Goals reviewed with patient? {yes/no:20286}  SHORT TERM GOALS: Target date: {follow up:25551}  Pt will demonstrate appropriate understanding and performance of initially prescribed HEP in order to facilitate improved independence with management of symptoms.   Baseline: HEP provided on eval Goal status: INITIAL   2. Pt will score greater than or equal to *** on FOTO in order to demonstrate improved perception of function due to symptoms.  Baseline: ***  Goal status: INITIAL   LONG TERM GOALS: Target date: {follow up:25551}  Pt will score *** on FOTO in order to demonstrate improved perception of functional status due to symptoms.  Baseline: *** Goal status: INITIAL  2.  Pt will demonstrate *** lumbar AROM in order to demonstrate improved tolerance to functional movement patterns.   Baseline: *** Goal status: INITIAL  3.  Pt will demonstrate hip MMT of *** in order to demonstrate improved strength for functional movements.  Baseline: *** Goal status: INITIAL  4. Pt will perform 5xSTS in <*** sec in order to demonstrate reduced fall risk and improved functional independence. (MCID of 2.3sec)  Baseline: ***  Goal status: INITIAL   PLAN:  PT FREQUENCY: {rehab frequency:25116}  PT DURATION: {rehab duration:25117}  PLANNED INTERVENTIONS: {rehab planned interventions:25118::"Therapeutic exercises","Therapeutic activity","Neuromuscular re-education","Balance training","Gait training","Patient/Family education","Self Care","Joint mobilization"}.  PLAN FOR NEXT SESSION: Progress ROM/strengthening exercises as able/appropriate, review HEP.    Leeroy Cha PT, DPT 09/24/2022 9:51 AM

## 2022-09-25 ENCOUNTER — Ambulatory Visit: Payer: Medicare Other | Admitting: Physical Therapy

## 2022-09-28 ENCOUNTER — Ambulatory Visit: Payer: Medicare Other | Admitting: Adult Health

## 2022-09-28 ENCOUNTER — Ambulatory Visit: Payer: Medicare Other

## 2022-10-02 ENCOUNTER — Other Ambulatory Visit: Payer: Self-pay | Admitting: Otolaryngology

## 2022-10-05 ENCOUNTER — Ambulatory Visit: Payer: Medicare Other | Attending: Orthopaedic Surgery

## 2022-10-05 NOTE — Therapy (Incomplete)
OUTPATIENT PHYSICAL THERAPY THORACOLUMBAR EVALUATION   Patient Name: Sheri Rivera MRN: [id number] DOB:1961/07/07, 61 y.o., female Today's Date: 10/05/2022    Past Medical History:  Diagnosis Date   Anxiety 11/11/11   Arthritis    Asthma    Bursitis    right shoulder   Chronic back pain    Chronic neck pain    Chronic shoulder pain    COPD (chronic obstructive pulmonary disease) ([place])    continues to smoke 1ppd   DDD (degenerative disc disease)    Depression    Fibromyalgia    GERD (gastroesophageal reflux disease)    Heart murmur    as a child per pt   Herpes    History of radiation therapy 08/12/18- 10/03/18   Larynx, prescribed dose of 63 Gy in 28 fractions. She chose to stop treatments after 27 fractions.    IBS (irritable bowel syndrome)    Lumbar radiculopathy    Seasonal allergies    Smoker    Thyroid nodule    goiters   Past Surgical History:  Procedure Laterality Date   CHOLECYSTECTOMY     DIRECT LARYNGOSCOPY N/A 07/11/2018   Procedure: DIRECT LARYNGOSCOPY WITH BIOPSY;  Surgeon: Rozetta Nunnery, MD;  Location: [facility];  Service: ENT;  Laterality: N/A;   HERNIA REPAIR     [place]    [place]   Patient Active Problem List   Diagnosis Date Noted   Neck pain 09/04/2022   Spinal stenosis of lumbar region 04/30/2022   Fibromyalgia 09/01/2021   Radiation induced neuropathy ([place]) 09/01/2021   Nerve pain 09/01/2021   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   Bladder cystocele 07/21/2012   Clinical depression 11/11/2011   Allergic rhinitis 11/11/2011   URI (upper respiratory infection) 09/28/2011   Sore throat 07/18/2011   Neck swelling 06/22/2011   Ingrown right big toenail 05/23/2011   Chronic back pain 03/16/2011   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/05/2010   Shortness of breath 03/15/2010   GOITER, MULTINODULAR 07/01/2009   FIBROMYALGIA 07/01/2009   ALLERGIC RHINITIS CAUSE UNSPECIFIED 03/02/2008   Anxiety 01/23/2007   TOBACCO DEPENDENCE 01/23/2007   GASTROESOPHAGEAL REFLUX, NO ESOPHAGITIS 01/23/2007    PCP: ***  REFERRING PROVIDER: ***  REFERRING DIAG:  M54.50,G89.29 (ICD-10-CM) - Chronic bilateral low back pain, unspecified whether sciatica present  M47.22 (ICD-10-CM) - Other spondylosis with radiculopathy, cervical region    Rationale for Evaluation and Treatment: {HABREHAB:27488}  THERAPY DIAG:  No diagnosis found.  ONSET DATE: ***  SUBJECTIVE:  SUBJECTIVE STATEMENT: ***  PERTINENT HISTORY:  ***  PAIN:  Are you having pain? {OPRCPAIN:27236}  PRECAUTIONS: {Therapy precautions:24002}  WEIGHT BEARING RESTRICTIONS: {Yes ***/No:24003}  FALLS:  Has patient fallen in last 6 months? {fallsyesno:27318}  LIVING ENVIRONMENT: Lives with: {[facility] lives with:25569::"lives with their family"} Lives in: {Lives in:25570} Stairs: {opstairs:27293} Has following equipment at home: {Assistive devices:23999}  OCCUPATION: ***  PLOF: {PLOF:24004}  PATIENT GOALS: ***  NEXT MD VISIT:   OBJECTIVE:   DIAGNOSTIC  FINDINGS:  ***  PATIENT SURVEYS:  {rehab surveys:24030}  SCREENING FOR RED FLAGS: Bowel or bladder incontinence: {Yes/No:304960894} Spinal tumors: {Yes/No:304960894} Cauda equina syndrome: {Yes/No:304960894} Compression fracture: {Yes/No:304960894} Abdominal aneurysm: {Yes/No:304960894}  COGNITION: Overall cognitive status: {cognition:24006}     SENSATION: {sensation:27233}  MUSCLE LENGTH: Hamstrings: Right *** deg; Left *** deg Thomas test: Right *** deg; Left *** deg  POSTURE: {posture:25561}  PALPATION: ***  LUMBAR ROM:   AROM eval  Flexion   Extension   Right lateral flexion   Left lateral flexion   Right rotation   Left rotation    (Blank rows = not tested)  LOWER EXTREMITY ROM:     {AROM/PROM:27142}  Right eval Left eval  Hip flexion    Hip extension    Hip abduction    Hip adduction    Hip internal rotation    Hip external rotation    Knee flexion    Knee extension    Ankle dorsiflexion    Ankle plantarflexion    Ankle inversion    Ankle eversion     (Blank rows = not tested)  LOWER EXTREMITY MMT:    MMT Right eval Left eval  Hip flexion    Hip extension    Hip abduction    Hip adduction    Hip internal rotation    Hip external rotation    Knee flexion    Knee extension    Ankle dorsiflexion    Ankle plantarflexion    Ankle inversion    Ankle eversion     (Blank rows = not tested)  LUMBAR SPECIAL TESTS:  {lumbar special test:25242}  FUNCTIONAL TESTS:  {Functional tests:24029}  GAIT: Distance walked: *** Assistive device utilized: {Assistive devices:23999} Level of assistance: {Levels of assistance:24026} Comments: ***  TODAY'S TREATMENT:                                                                                                                              DATE: ***    PATIENT EDUCATION:  Education details: *** Person educated: {Person educated:25204} Education method: {Education Method:25205} Education  comprehension: {Education Comprehension:25206}  HOME EXERCISE PROGRAM: ***  ASSESSMENT:  CLINICAL IMPRESSION: Patient is a *** y.o. *** who was seen today for physical therapy evaluation and treatment for ***.   OBJECTIVE IMPAIRMENTS: {opptimpairments:25111}.   ACTIVITY LIMITATIONS: {activitylimitations:27494}  PARTICIPATION LIMITATIONS: {participationrestrictions:25113}  PERSONAL FACTORS: {Personal factors:25162} are also affecting patient's functional outcome.   REHAB POTENTIAL: {rehabpotential:25112}  CLINICAL DECISION MAKING: {clinical decision  making:25114}  EVALUATION COMPLEXITY: {Evaluation complexity:25115}   GOALS: Goals reviewed with patient? {yes/no:20286}  SHORT TERM GOALS: Target date: {follow up:25551}  *** Baseline: Goal status: {GOALSTATUS:25110}  2.  *** Baseline:  Goal status: {GOALSTATUS:25110}  3.  *** Baseline:  Goal status: {GOALSTATUS:25110}  4.  *** Baseline:  Goal status: {GOALSTATUS:25110}  5.  *** Baseline:  Goal status: {GOALSTATUS:25110}  6.  *** Baseline:  Goal status: {GOALSTATUS:25110}  LONG TERM GOALS: Target date: {follow up:25551}  *** Baseline:  Goal status: {GOALSTATUS:25110}  2.  *** Baseline:  Goal status: {GOALSTATUS:25110}  3.  *** Baseline:  Goal status: {GOALSTATUS:25110}  4.  *** Baseline:  Goal status: {GOALSTATUS:25110}  5.  *** Baseline:  Goal status: {GOALSTATUS:25110}  6.  *** Baseline:  Goal status: {GOALSTATUS:25110}  PLAN:  PT FREQUENCY: {rehab frequency:25116}  PT DURATION: {rehab duration:25117}  PLANNED INTERVENTIONS: {rehab planned interventions:25118::"Therapeutic exercises","Therapeutic activity","Neuromuscular re-education","Balance training","Gait training","Patient/Family education","Self Care","Joint mobilization"}.  PLAN FOR NEXT SESSION: ***   Izell [place], PT, DPT 10/05/2022, 10:55 AM

## 2022-10-09 ENCOUNTER — Encounter (HOSPITAL_COMMUNITY): Payer: Self-pay | Admitting: Anesthesiology

## 2022-10-09 ENCOUNTER — Ambulatory Visit: Payer: Medicare Other | Admitting: Orthopaedic Surgery

## 2022-10-11 ENCOUNTER — Encounter (HOSPITAL_BASED_OUTPATIENT_CLINIC_OR_DEPARTMENT_OTHER): Payer: Self-pay | Admitting: Otolaryngology

## 2022-10-11 NOTE — Progress Notes (Signed)
   10/11/22 0851  PAT Phone Screen  Is the patient taking a GLP-1 receptor agonist? No  Do You Have Diabetes? No  Do You Have Hypertension? No  Have You Ever Been to the ER for Asthma? No  Have You Taken Oral Steroids in the Past 3 Months? (S)  Yes (finsihing pred pack tomorrow for COPD flare up)  Do you Take Phenteramine or any Other Diet Drugs? No  Recent  Lab Work, EKG, CXR? No  Do you have a history of heart problems? No  Any Recent Hospitalizations? No  Height '5\' 3"'$  (1.6 m)  Weight 78.9 kg  Pat Appointment Scheduled No  Reason for No Appointment Not Needed   Reviewed with Dr. Therisa Doyne at [facility]. Okay to proceed with surgery as scheduled.

## 2022-10-15 ENCOUNTER — Ambulatory Visit
Admission: EM | Admit: 2022-10-15 | Discharge: 2022-10-15 | Discharge status: Against Medical Advice | Payer: Medicare Other

## 2022-10-17 ENCOUNTER — Telehealth: Payer: Self-pay

## 2022-10-17 ENCOUNTER — Ambulatory Visit (HOSPITAL_COMMUNITY): Admission: RE | Admit: 2022-10-17 | Payer: Medicare Other | Source: Home / Self Care | Admitting: Otolaryngology

## 2022-10-17 SURGERY — MICROLARYNGOSCOPY
Anesthesia: General | Laterality: Bilateral

## 2022-10-17 MED ORDER — HYDROCODONE-ACETAMINOPHEN 5-325 MG PO TABS: 1.0000 | ORAL_TABLET | Freq: Four times a day (QID) | ORAL | 0 refills | Status: DC | PRN

## 2022-10-17 NOTE — Telephone Encounter (Signed)
PMP was Reviewed., Dr Dagoberto Ligas note was reviewed. Hydrocodone e-scribed to pharmacy.  Placed a call to Ms. Jake Michaelis, no answer, left message to return the call.

## 2022-10-17 NOTE — Telephone Encounter (Signed)
Patient stated she will be out of her pain medication on 10/20/2022. Please advise Dr. Dagoberto Ligas is out of the office.

## 2022-10-22 ENCOUNTER — Ambulatory Visit (INDEPENDENT_AMBULATORY_CARE_PROVIDER_SITE_OTHER): Payer: Medicare Other

## 2022-10-22 ENCOUNTER — Other Ambulatory Visit: Payer: Self-pay

## 2022-10-22 ENCOUNTER — Ambulatory Visit
Admission: EM | Admit: 2022-10-22 | Discharge: 2022-10-22 | Disposition: A | Discharge status: Home / Self Care | Payer: Medicare Other | Attending: Internal Medicine | Admitting: Internal Medicine

## 2022-10-22 ENCOUNTER — Encounter: Payer: Self-pay | Admitting: Emergency Medicine

## 2022-10-22 DIAGNOSIS — R0602 Shortness of breath: Secondary | ICD-10-CM | POA: Diagnosis not present

## 2022-10-22 DIAGNOSIS — W19XXXA Unspecified fall, initial encounter: Secondary | ICD-10-CM

## 2022-10-22 DIAGNOSIS — R0781 Pleurodynia: Secondary | ICD-10-CM

## 2022-10-22 MED ORDER — LIDOCAINE 5 % EX PTCH: 1.0000 | MEDICATED_PATCH | CUTANEOUS | 0 refills | Status: DC

## 2022-10-22 NOTE — ED Triage Notes (Signed)
Pt here for fall 2 weeks ago having increasing pain in bilateral rib area; pt sts painful to taken deep breath and has hx of COPD

## 2022-10-22 NOTE — Discharge Instructions (Signed)
X-ray was normal.  Please monitor your oxygen very closely at home and go to the ER if symptoms persist or worsen or if your oxygen remains below 95%.  Recommend ice application and I have prescribed you lidocaine patches to apply directly to area.

## 2022-10-22 NOTE — ED Provider Notes (Signed)
[facility]    CSN: [id number] Arrival date & time: 10/22/22  1332      History   Chief Complaint Chief Complaint  Patient presents with   Fall    HPI Sheri Rivera is a 61 y.o. female.   Patient presents with left-sided rib pain after a fall that occurred 2 weeks ago.  Patient reports that she was walking her dogs when she tripped and fell forward.  Denies hitting head or losing consciousness.  Reports that she does not take any blood thinning medications.  Patient only reporting pain in the left side of the ribs.  She is reporting shortness of breath and difficulty taking a deep breath due to pain.  She does have a history of COPD as well.  States her normal oxygen is 95 to 97% and she does not wear oxygen at home.  She has taken her previously prescribed Norco with minimal improvement as well as ibuprofen.   Fall    Past Medical History:  Diagnosis Date   Anxiety 11/11/2011   Arthritis    Asthma    Bursitis    right shoulder   Chronic back pain    Chronic neck pain    Chronic shoulder pain    COPD (chronic obstructive pulmonary disease) (HCC)    continues to smoke 1ppd   DDD (degenerative disc disease)    Depression    Fibromyalgia    GERD (gastroesophageal reflux disease)    Heart murmur    as a child per pt   Herpes    History of radiation therapy 08/12/18- 10/03/18   Larynx, prescribed dose of 63 Gy in 28 fractions. She chose to stop treatments after 27 fractions.    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   Acute respiratory failure with hypoxia ([place]) 08/03/2017   GERD (gastroesophageal  reflux disease) 06/13/2016   Chronic obstructive pulmonary disease ([place]) 06/13/2016   Current tobacco use 09/05/2015   History of fracture of vertebra 02/16/2015   Diffuse pain 04/03/2014   Hemorrhage, postmenopausal 12/29/2013   Chronic obstructive pulmonary disease with acute exacerbation ([place]) 10/30/2013   COPD mixed type ([place]) 03/30/2013   Abdominal pain, epigastric 02/25/2013   Dysphagia, unspecified(787.20) 02/25/2013   Gas 02/25/2013   Abdominal bloating 02/25/2013   Precordial pain 02/16/2013   Multinodular goiter 10/05/2012   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   Bladder cystocele 07/21/2012   Clinical depression 11/11/2011   Allergic rhinitis 11/11/2011   URI (upper respiratory infection) 09/28/2011   Sore throat 07/18/2011   Neck swelling 06/22/2011   Ingrown right big toenail 05/23/2011   Chronic back pain 03/16/2011   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/05/2010   Shortness of breath 03/15/2010   GOITER, MULTINODULAR 07/01/2009   FIBROMYALGIA 07/01/2009   ALLERGIC RHINITIS CAUSE UNSPECIFIED 03/02/2008   Anxiety 01/23/2007   TOBACCO DEPENDENCE 01/23/2007   GASTROESOPHAGEAL REFLUX, NO ESOPHAGITIS 01/23/2007    Past Surgical History:  Procedure Laterality Date   CHOLECYSTECTOMY     DIRECT LARYNGOSCOPY N/A 07/11/2018   Procedure: DIRECT LARYNGOSCOPY WITH BIOPSY;  Surgeon: Rozetta Nunnery,  MD;  Location: [facility];  Service: ENT;  Laterality: N/A;   HERNIA REPAIR     Hiatal    HIATAL HERNIA REPAIR     TUBAL LIGATION  1983    OB History   No obstetric history on file.      Home Medications    Prior to Admission medications   Medication Sig Start Date End Date Taking? Authorizing Provider  lidocaine (LIDODERM) 5 % Place 1 patch onto the skin daily. Remove & Discard patch within 12 hours or as directed by MD 10/22/22  Yes Teodora Medici, FNP  albuterol (PROVENTIL HFA;VENTOLIN HFA) 108 (90 BASE) MCG/ACT inhaler Inhale 2 puffs into the lungs  every 6 (six) hours as needed for shortness of breath. Shortness of breath    [provider]  albuterol (PROVENTIL) (2.5 MG/3ML) 0.083% nebulizer solution Take 3 mLs (2.5 mg total) by nebulization every 4 (four) hours as needed for wheezing or shortness of breath. Shortness of breath 08/02/16   Molpus, Jenny Reichmann, MD  beclomethasone (QVAR) 80 MCG/ACT inhaler Inhale into the lungs. 01/18/17   [provider]  cyclobenzaprine (FLEXERIL) 10 MG tablet Take 1 tablet (10 mg total) by mouth at bedtime. 09/21/22   Marybelle Killings, MD  fluticasone (FLONASE) 50 MCG/ACT nasal spray Place 1 spray into both nostrils 2 (two) times daily. 10/13/21   [provider]  HYDROcodone-acetaminophen (NORCO) 5-325 MG tablet Take 1 tablet by mouth every 6 (six) hours as needed for moderate pain. 10/17/22   Bayard Hugger, NP  ipratropium-albuterol (DUONEB) 0.5-2.5 (3) MG/3ML SOLN SMARTSIG:3 Milliliter(s) Via Nebulizer Every 6 Hours PRN 08/21/21   [provider]  levothyroxine (SYNTHROID) 25 MCG tablet Take 25 mcg by mouth daily before breakfast.    [provider]  LORazepam (ATIVAN) 0.5 MG tablet Take 1 tab PRN extreme anxiety, not to exceed once daily. Use sparingly. Future Rx only at the discretion of the primary care provider. 11/07/18   Eppie Gibson, MD  LORazepam (ATIVAN) 1 MG tablet Take 0.5 mg by mouth 2 (two) times daily. 04/07/22   [provider]  predniSONE (DELTASONE) 20 MG tablet Take 20 mg by mouth daily. 03/23/22   [provider]  predniSONE (STERAPRED UNI-PAK 21 TAB) 10 MG (21) TBPK tablet Take by mouth daily. Take 6 tabs by mouth daily  for 2 days, then 5 tabs for 2 days, then 4 tabs for 2 days, then 3 tabs for 2 days, 2 tabs for 2 days, then 1 tab by mouth daily for 2 days 07/08/21   Teodora Medici, FNP  predniSONE (STERAPRED UNI-PAK 21 TAB) 10 MG (21) TBPK tablet Take 6,5,4,3,2,1 one tablet less each day with food. 09/21/22   Marybelle Killings, MD  tiZANidine  (ZANAFLEX) 4 MG tablet Take 1 tablet (4 mg total) by mouth every 6 (six) hours as needed for muscle spasms. 06/15/22   Francene Finders, PA-C    Family History Family History  Problem Relation Age of Onset   Lung cancer Mother    Arthritis Mother        Rheumatoid   Bursitis Mother    Asthma Mother    Heart attack Father 44       Died of MI   Obesity Father    Fibromyalgia Sister    Asthma Child     Social History Social History   Tobacco Use   Smoking status: Every Day    Packs/day: 1.00  Years: 41.00    Total pack years: 41.00    Types: Cigarettes    Start date: 1980   Smokeless tobacco: Never   Tobacco comments:    She had quit until COVID-19, She is smoking 4-5 cigarettes daily, 07/24/19  Vaping Use   Vaping Use: Never used  Substance Use Topics   Alcohol use: No   Drug use: No     Allergies   Clindamycin, Cymbalta [duloxetine hcl], Levaquin [levofloxacin in d5w], Pregabalin, Shellfish allergy, Sulfa antibiotics, Sulfamethoxazole, and Codeine   Review of Systems Review of Systems Per HPI  Physical Exam Triage Vital Signs ED Triage Vitals  Enc Vitals Group     BP 10/22/22 1620 133/80     Pulse Rate 10/22/22 1620 84     Resp 10/22/22 1620 18     Temp 10/22/22 1620 97.9 F (36.6 C)     Temp Source 10/22/22 1620 Oral     SpO2 10/22/22 1620 91 %     Weight --      Height --      Head Circumference --      Peak Flow --      Pain Score 10/22/22 1621 7     Pain Loc --      Pain Edu? --      Excl. in [place]? --    No data found.  Updated Vital Signs BP 133/80 (BP Location: Left Arm)   Pulse 84   Temp 97.9 F (36.6 C) (Oral)   Resp 18   SpO2 91%   Visual Acuity Right Eye Distance:   Left Eye Distance:   Bilateral Distance:    Right Eye Near:   Left Eye Near:    Bilateral Near:     Physical Exam Constitutional:      General: She is not in acute distress.    Appearance: Normal appearance. She is not toxic-appearing or diaphoretic.  HENT:      Head: Normocephalic and atraumatic.  Eyes:     Extraocular Movements: Extraocular movements intact.     Conjunctiva/sclera: Conjunctivae normal.  Cardiovascular:     Rate and Rhythm: Normal rate and regular rhythm.     Pulses: Normal pulses.     Heart sounds: Normal heart sounds.  Pulmonary:     Effort: Pulmonary effort is normal. No respiratory distress.     Breath sounds: Normal breath sounds. No stridor. No wheezing, rhonchi or rales.  Chest:     Chest wall: Tenderness present.       Comments: Tenderness to palpation to mid to lower left lateral rib.  No obvious swelling, discoloration, lacerations, abrasions noted. Abdominal:     General: Bowel sounds are normal. There is no distension.     Palpations: Abdomen is soft.     Tenderness: There is no abdominal tenderness.  Neurological:     General: No focal deficit present.     Mental Status: She is alert and oriented to person, place, and time. Mental status is at baseline.  Psychiatric:        Mood and Affect: Mood normal.        Behavior: Behavior normal.        Thought Content: Thought content normal.        Judgment: Judgment normal.      UC Treatments / Results  Labs (all labs ordered are listed, but only abnormal results are displayed) Labs Reviewed - No data to display  EKG   Radiology DG Ribs Unilateral  W/Chest Left  Result Date: 10/22/2022 CLINICAL DATA:  shortness of breath, fall EXAM: LEFT RIBS AND CHEST - 3+ VIEW COMPARISON:  Radiograph 07/08/2021 FINDINGS: Unchanged cardiomediastinal silhouette. There is no focal airspace consolidation. There are chronic interstitial changes, similar to prior exam. No pleural effusion or pneumothorax. There is no evidence of displaced rib fracture. There is no other acute osseous abnormality identified. IMPRESSION: No evidence of displaced rib fracture. Chronic interstitial changes without new focal airspace disease. Electronically Signed   By: Maurine Simmering M.D.   On:  10/22/2022 16:53    Procedures Procedures (including critical care time)  Medications Ordered in UC Medications - No data to display  Initial Impression / Assessment and Plan / UC Course  I have reviewed the triage vital signs and the nursing notes.  Pertinent labs & imaging results that were available during my care of the patient were reviewed by me and considered in my medical decision making (see chart for details).     Patient was originally advised to go to the ER for further evaluation given that her oxygen was ranging from 91 to 93% and she was having difficulty taking a deep breath due to pain.  Patient adamantly refused going to the ER.  Risks associated with not going to the ER were discussed with patient.  Patient voiced understanding of risks.  Will perform limited evaluation here in urgent care given patient refusing to go to the ER.  Left chest/rib x-ray was negative for any acute bony abnormality or acute cardiopulmonary process.  Patient was advised to monitor oxygen very closely at home with her pulse oximeter and to go to the ER if symptoms persist or worsen or if oxygen remains low.  Advised deep breathing techniques and using her incentive spirometer that she already has at home.  Suspect bruising versus muscle strain/injury due to mechanism of injury given the x-ray was negative.  Advised ice application as well.  Patient already has prescription for Norco so will prescribe lidocaine patch topically.  She denies that she uses any topical medications at this time.  Discussed strict return and ER precautions.  Patient verbalized understanding and was agreeable with plan. Final Clinical Impressions(s) / UC Diagnoses   Final diagnoses:  Fall, initial encounter  Rib pain on left side     Discharge Instructions      X-ray was normal.  Please monitor your oxygen very closely at home and go to the ER if symptoms persist or worsen or if your oxygen remains below 95%.   Recommend ice application and I have prescribed you lidocaine patches to apply directly to area.     ED Prescriptions     Medication Sig Dispense Auth. Provider   lidocaine (LIDODERM) 5 % Place 1 patch onto the skin daily. Remove & Discard patch within 12 hours or as directed by MD 30 patch Bude, Michele Rockers, FNP      I have reviewed the PDMP during this encounter.   Teodora Medici, [place] 10/22/22 1721

## 2022-11-02 ENCOUNTER — Ambulatory Visit: Payer: Medicare Other | Admitting: Adult Health

## 2022-11-12 ENCOUNTER — Telehealth: Payer: Self-pay

## 2022-11-12 NOTE — Telephone Encounter (Signed)
Patient requesting refill on Hydrocodone, last refill per pmp 10/19/22   Filled  Written  ID  Drug  QTY  Days  Prescriber  RX #  Dispenser  Refill  Daily Dose*  Pymt Type  PMP  10/19/2022 10/17/2022 1  Hydrocodone-Acetamin 5-325 Mg 120.00 30 Eu Tho [id number] [facility] (4997) 0/0 20.00 MME Medicare [place]

## 2022-11-12 NOTE — Progress Notes (Deleted)
NEUROLOGY CONSULTATION NOTE  Sheri Rivera MRN: [id number] DOB: Oct 14, 1961  Referring provider: Horald Pollen, MD Primary care provider: Horald Pollen, MD  Reason for consult:  back pain  Assessment/Plan:   ***   Subjective:  Sheri Rivera is a 61 year old female with COPD, fibromyalgia and arthritis who presents for chronic back pain and ***.  History supplemented by orthopedic notes.  Patient has reported history of fibromyalgia under the care of pain management.  She has polyarthralgia.  Autoimmune labs from October, including ANA, sed rate and RF, were negative. She has lumbar spinal stenosis causing claudication.  MRI of lumbar spine on 04/21/2022 personally reviewed revealed moderate spinal canal stenosis at L4-5 with left foraminal disc protrusion and foraminal narrowing.  She has been ***     PAST MEDICAL HISTORY: Past Medical History:  Diagnosis Date   Anxiety 11/11/2011   Arthritis    Asthma    Bursitis    right shoulder   Chronic back pain    Chronic neck pain    Chronic shoulder pain    COPD (chronic obstructive pulmonary disease) (HCC)    continues to smoke 1ppd   DDD (degenerative disc disease)    Depression    Fibromyalgia    GERD (gastroesophageal reflux disease)    Heart murmur    as a child per pt   Herpes    History of radiation therapy 08/12/18- 10/03/18   Larynx, prescribed dose of 63 Gy in 28 fractions. She chose to stop treatments after 27 fractions.    IBS (irritable bowel syndrome)    Lumbar radiculopathy    Seasonal allergies    Smoker    Thyroid nodule    goiters    PAST SURGICAL HISTORY: Past Surgical History:  Procedure Laterality Date   CHOLECYSTECTOMY     DIRECT LARYNGOSCOPY N/A 07/11/2018   Procedure: DIRECT LARYNGOSCOPY WITH BIOPSY;  Surgeon: Rozetta Nunnery, MD;  Location: [facility];  Service: ENT;  Laterality: N/A;   HERNIA REPAIR     Hiatal    HIATAL HERNIA REPAIR     TUBAL LIGATION  1983     MEDICATIONS: Current Outpatient Medications on File Prior to Visit  Medication Sig Dispense Refill   albuterol (PROVENTIL HFA;VENTOLIN HFA) 108 (90 BASE) MCG/ACT inhaler Inhale 2 puffs into the lungs every 6 (six) hours as needed for shortness of breath. Shortness of breath     albuterol (PROVENTIL) (2.5 MG/3ML) 0.083% nebulizer solution Take 3 mLs (2.5 mg total) by nebulization every 4 (four) hours as needed for wheezing or shortness of breath. Shortness of breath 25 vial 0   beclomethasone (QVAR) 80 MCG/ACT inhaler Inhale into the lungs.     cyclobenzaprine (FLEXERIL) 10 MG tablet Take 1 tablet (10 mg total) by mouth at bedtime. 30 tablet 1   fluticasone (FLONASE) 50 MCG/ACT nasal spray Place 1 spray into both nostrils 2 (two) times daily.     HYDROcodone-acetaminophen (NORCO) 5-325 MG tablet Take 1 tablet by mouth every 6 (six) hours as needed for moderate pain. 120 tablet 0   ipratropium-albuterol (DUONEB) 0.5-2.5 (3) MG/3ML SOLN SMARTSIG:3 Milliliter(s) Via Nebulizer Every 6 Hours PRN     levothyroxine (SYNTHROID) 25 MCG tablet Take 25 mcg by mouth daily before breakfast.     lidocaine (LIDODERM) 5 % Place 1 patch onto the skin daily. Remove & Discard patch within 12 hours or as directed by MD 30 patch 0   LORazepam (ATIVAN) 0.5 MG tablet Take  1 tab PRN extreme anxiety, not to exceed once daily. Use sparingly. Future Rx only at the discretion of the primary care provider. 20 tablet 0   LORazepam (ATIVAN) 1 MG tablet Take 0.5 mg by mouth 2 (two) times daily.     predniSONE (DELTASONE) 20 MG tablet Take 20 mg by mouth daily.     predniSONE (STERAPRED UNI-PAK 21 TAB) 10 MG (21) TBPK tablet Take by mouth daily. Take 6 tabs by mouth daily  for 2 days, then 5 tabs for 2 days, then 4 tabs for 2 days, then 3 tabs for 2 days, 2 tabs for 2 days, then 1 tab by mouth daily for 2 days 42 tablet 0   predniSONE (STERAPRED UNI-PAK 21 TAB) 10 MG (21) TBPK tablet Take 6,5,4,3,2,1 one tablet less each day  with food. 21 tablet 0   tiZANidine (ZANAFLEX) 4 MG tablet Take 1 tablet (4 mg total) by mouth every 6 (six) hours as needed for muscle spasms. 30 tablet 0   No current facility-administered medications on file prior to visit.    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  Patient appears well-groomed.   Head:  Normocephalic/atraumatic Eyes:  fundi examined but not visualized Neck: supple, no paraspinal tenderness, full range of motion Back: No paraspinal tenderness Heart: regular rate and rhythm Lungs: Clear to auscultation bilaterally. Vascular: No carotid bruits. Neurological Exam: Mental status: alert and oriented to person, place, and time, speech fluent and not dysarthric, language intact. Cranial nerves: CN I: not tested CN II: pupils equal, round and reactive to light, visual fields intact CN III, IV, VI:  full range of motion, no nystagmus, no ptosis CN V: facial sensation intact. CN VII: upper and lower face symmetric CN VIII: hearing intact CN IX, X: gag intact, uvula midline CN XI: sternocleidomastoid and trapezius muscles intact CN XII: tongue midline Bulk  & Tone: normal, no fasciculations. Motor:  muscle strength 5/5 throughout Sensation:  Pinprick, temperature and vibratory sensation intact. Deep Tendon Reflexes:  2+ throughout,  toes downgoing.   Finger to nose testing:  Without dysmetria.   Heel to shin:  Without dysmetria.   Gait:  Normal station and stride.  Romberg negative.    Thank you for allowing me to take part in the care of this patient.  Metta Clines, DO  CC: ***

## 2022-11-12 NOTE — Telephone Encounter (Signed)
Call place to Sheri Rivera, Chart Reviewed, she went to Urgent Care on 10/22/2022, post fall. Awaiting a return call.

## 2022-11-13 ENCOUNTER — Telehealth: Payer: Self-pay | Admitting: Registered Nurse

## 2022-11-13 ENCOUNTER — Encounter: Payer: Self-pay | Admitting: Neurology

## 2022-11-13 ENCOUNTER — Ambulatory Visit: Payer: Medicare Other | Admitting: Neurology

## 2022-11-13 MED ORDER — HYDROCODONE-ACETAMINOPHEN 5-325 MG PO TABS: 1.0000 | ORAL_TABLET | Freq: Four times a day (QID) | ORAL | 0 refills | Status: DC | PRN

## 2022-11-13 NOTE — Telephone Encounter (Signed)
PMP was Reviewed.  Spoke with Sheri Rivera regarding her recent fall, she was walking her three dogs and one of the dogs seen a cat and began to chase the cat. She fell to the ground, no more falls.  Hydrocodone e-scribed today, she verbalizes understanding.

## 2022-12-03 ENCOUNTER — Ambulatory Visit: Payer: Medicaid Other | Admitting: Neurology

## 2022-12-07 ENCOUNTER — Encounter: Payer: 59 | Attending: Physical Medicine and Rehabilitation | Admitting: Physical Medicine and Rehabilitation

## 2022-12-07 DIAGNOSIS — M797 Fibromyalgia: Secondary | ICD-10-CM | POA: Insufficient documentation

## 2022-12-07 DIAGNOSIS — G6282 Radiation-induced polyneuropathy: Secondary | ICD-10-CM | POA: Insufficient documentation

## 2022-12-07 DIAGNOSIS — M48062 Spinal stenosis, lumbar region with neurogenic claudication: Secondary | ICD-10-CM | POA: Insufficient documentation

## 2022-12-11 ENCOUNTER — Encounter (HOSPITAL_BASED_OUTPATIENT_CLINIC_OR_DEPARTMENT_OTHER): Payer: Self-pay | Admitting: Otolaryngology

## 2022-12-11 ENCOUNTER — Other Ambulatory Visit: Payer: Self-pay

## 2022-12-12 ENCOUNTER — Encounter (HOSPITAL_BASED_OUTPATIENT_CLINIC_OR_DEPARTMENT_OTHER): Payer: 59 | Admitting: Physical Medicine and Rehabilitation

## 2022-12-12 ENCOUNTER — Encounter: Payer: Self-pay | Admitting: Physical Medicine and Rehabilitation

## 2022-12-12 VITALS — BP 121/69 | HR 68 | Ht 62.0 in | Wt 174.0 lb

## 2022-12-12 DIAGNOSIS — M48062 Spinal stenosis, lumbar region with neurogenic claudication: Secondary | ICD-10-CM | POA: Diagnosis not present

## 2022-12-12 DIAGNOSIS — G6282 Radiation-induced polyneuropathy: Secondary | ICD-10-CM

## 2022-12-12 DIAGNOSIS — M797 Fibromyalgia: Secondary | ICD-10-CM

## 2022-12-12 NOTE — Patient Instructions (Signed)
Awake, alert, appropriate, looks exhausted; leaning on cane to sit on table; NAD TTP across piriformis B/L  Also TTP in middle of L4/5- not on paraspinals, but on spine itself.  If leans forward, c/o less pain.  Lumbar extension makes things worse.      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  Has appt with ENT asap due to more lumps she can feel and also dx'd with psoriatic arthritis.  Here for f/u on pain.   Try to take Norco  1.5 4x/day-  to see if that will make her dopey or confused- is scared of getting "drugged up".   2. Call me Friday- and let me know if want to increase Norco to 7.5 mg 4x/day so that will give [place] enough time to know without running you out of meds before it's time.    3. Call Dr Lorin Mercy and ask about ESI- epidural steroid injections- if don't want to step on Dr Lorin Mercy toes but can have my partner Dr Letta Pate.    4. Goal to increase quality of life.   5. Uses Flexeril at bedtime  6. Nerve pain meds were not effective/had side effects. On allergy list.   7. Was told back stimulator- was not appropriate for her by Dr Lorin Mercy.    8. Think about referral to Pain Management in [facility].   9. F/U in 3 months- but you will call me Friday AM

## 2022-12-12 NOTE — Progress Notes (Signed)
Subjective:    Patient ID: Sheri Rivera, female    DOB: [birth year], 62 y.o.   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  Has appt with ENT asap due to more lumps she can feel and also dx'd with psoriatic arthritis.  Here for f/u on pain.    Wondering if Cancer has come back per pt. Getting Biopsy Tuesday to see if scar vs return of throat cancer.   Hurting really bad due to cold weather.  Couldn't even take shower- feels like thousand needles when water hits her.   [place] in November  2023-  Bought "Research officer, political party"- walking all 3 dogs- and went down holding all 3 dogs-  since they chased a cat Initially no pain, but by 7pm, felt was "dying"- on fire and numb in back and ribs hurt like bruises/fractured- took 6 weeks.   Had to postpone CT scan for spot on lung they found Feels like no quality of life- and stuck on couch all the time.    Washing dishes sitting on bar stool and cooking Sits on couch a lot, so even lays down Doesn't want to go up on pain meds.    Has had ESI's in past- a long time ago Going to [place]- sees Dr Lorin Mercy-    Last MRI- 04/21/22  Disc levels:   L1-2: Negative   L2-3: Mild disc degeneration with disc space narrowing and mild spurring. Negative for stenosis   L3-4: Mild disc bulging and mild facet degeneration. Mild narrowing of the central canal without significant stenosis   L4-5: Disc degeneration with disc space narrowing and disc bulging. Left foraminal disc protrusion similar to the prior study. Moderate left foraminal narrowing. Bilateral facet degeneration. Moderate central canal stenosis with mild progression from the prior study.   L5-S1: Moderate disc degeneration with disc space narrowing and endplate spurring.  Moderate right foraminal narrowing due to disc and osteophyte. No change from the prior study.   IMPRESSION: Mild degenerative change L2-3 and L3-4 without significant stenosis   Left foraminal disc protrusion L4-5 with moderate left foraminal narrowing unchanged from the prior study. Moderate central canal stenosis with mild progression from 2019   Right foraminal encroachment L5-S1 due to disc and osteophyte complex, unchanged    Brought pills- weren't counted.     Pain Inventory Average Pain 10 Pain Right Now 8 My pain is constant, sharp, burning, stabbing, tingling, and aching  In the last 24 hours, has pain interfered with the following? General activity 9 Relation with others 9 Enjoyment of life 9 What TIME of day is your pain at its worst? morning , daytime, evening, and night Sleep (in general) Fair  Pain is worse with: walking, bending, sitting, inactivity, standing, and some activites Pain improves with: medication Relief from Meds: 8  Family History  Problem Relation Age of Onset   Lung cancer Mother    Arthritis Mother        Rheumatoid   Bursitis Mother    Asthma Mother    Heart attack Father 59       Died of MI   Obesity Father    Fibromyalgia Sister    Asthma Child    Social History   Socioeconomic History   Marital status:  Divorced    Spouse name: Not on file   Number of children: 3   Years of education: Not on file   Highest education level: Not on file  Occupational History    Employer: [employer]  Tobacco Use   Smoking status: Every Day    Packs/day: 1.00    Years: 41.00    Total pack years: 41.00    Types: Cigarettes    Start date: 104   Smokeless tobacco: Never   Tobacco comments:    She had quit until COVID-19, She is smoking 4-5 cigarettes daily, 07/24/19  Vaping Use   Vaping Use: Never used  Substance and Sexual Activity   Alcohol use: No   Drug use: No   Sexual activity: Not on file  Other Topics Concern   Not on  file  Social History Narrative   She lives by herself   Social Determinants of Health   Financial Resource Strain: Not on file  Food Insecurity: Not on file  Transportation Needs: Unmet Transportation Needs (01/07/2019)   PRAPARE - Hydrologist (Medical): Yes    Lack of Transportation (Non-Medical): Not on file  Physical Activity: Not on file  Stress: Not on file  Social Connections: Not on file   Past Surgical History:  Procedure Laterality Date   CHOLECYSTECTOMY     DIRECT LARYNGOSCOPY N/A 07/11/2018   Procedure: DIRECT LARYNGOSCOPY WITH BIOPSY;  Surgeon: Rozetta Nunnery, MD;  Location: [facility];  Service: ENT;  Laterality: N/A;   HERNIA REPAIR     Hiatal    HIATAL HERNIA [place]   Past Surgical History:  Procedure Laterality Date   CHOLECYSTECTOMY     DIRECT LARYNGOSCOPY N/A 07/11/2018   Procedure: DIRECT LARYNGOSCOPY WITH BIOPSY;  Surgeon: Rozetta Nunnery, MD;  Location: [facility];  Service: ENT;  Laterality: N/A;   HERNIA REPAIR     Hiatal    HIATAL HERNIA REPAIR     TUBAL LIGATION  1983   Past Medical History:  Diagnosis Date   Anxiety 11/11/2011   Arthritis    Asthma    Bursitis    right shoulder   Chronic back pain    Chronic neck pain    Chronic shoulder pain    COPD (chronic obstructive pulmonary disease) ([place])    continues to smoke 1ppd   DDD (degenerative disc disease)    Depression    Fibromyalgia    GERD (gastroesophageal reflux disease)    Heart murmur    as a child per pt   Herpes    History of radiation therapy 08/12/18- 10/03/18   Larynx, prescribed dose of 63 Gy in 28 fractions. She chose to stop treatments after 27 fractions.    IBS (irritable bowel syndrome)    Lumbar radiculopathy    Seasonal allergies    Smoker    Thyroid nodule    goiters   There were no vitals taken for this visit.  Opioid Risk Score:   Fall Risk Score:   `1  Depression screen [facility] 2/9     01/19/2022    3:12 PM 09/01/2021    1:26 PM  Depression screen PHQ 2/9  Decreased Interest 1 3  Down, Depressed, Hopeless 0 2  PHQ - 2 Score 1 5  Altered sleeping  1  Tired, decreased energy  2  Change in appetite  0  Feeling bad or failure about  yourself   0  Trouble concentrating  0  Moving slowly or fidgety/restless  3  Suicidal thoughts  0  PHQ-9 Score  11  Difficult doing work/chores  Extremely dIfficult    Review of Systems  Musculoskeletal:  Positive for arthralgias, back pain, gait problem and neck pain.  All other systems reviewed and are negative.      Objective:   Physical Exam  Awake, alert, appropriate, looks exhausted; leaning on cane to sit on table; NAD TTP across piriformis B/L  Also TTP in middle of L4/5- not on paraspinals, but on spine itself.  If leans forward, c/o less pain.  Lumbar extension makes things worse.      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  Has appt with ENT asap due to more lumps she can feel and also dx'd with psoriatic arthritis.  Here for f/u on pain.   Try to take Norco  1.5 4x/day-  to see if that will make her dopey or confused- is scared of getting "drugged up".   2. Call me Friday- and let me know if want to increase Norco to 7.5 mg 4x/day so that will give [place] enough time to know without running you out of meds before it's time.    3. Call Dr Lorin Mercy and ask about ESI- epidural steroid injections- if don't want to step on Dr Lorin Mercy toes but can have my partner Dr Letta Pate.    4. Goal to increase quality of life.   5. Uses Flexeril at bedtime  6. Nerve pain meds were not effective/had side effects. On allergy list.   7. Was told back stimulator- was not  appropriate for her by Dr Lorin Mercy.    8. Think about referral to Pain Management in [facility].   9. F/U in 3 months- but you will call me Friday AM

## 2022-12-14 ENCOUNTER — Telehealth: Payer: Self-pay | Admitting: Physical Medicine and Rehabilitation

## 2022-12-14 MED ORDER — HYDROCODONE-ACETAMINOPHEN 7.5-325 MG PO TABS: 1.0000 | ORAL_TABLET | Freq: Four times a day (QID) | ORAL | 0 refills | Status: DC | PRN

## 2022-12-14 NOTE — Telephone Encounter (Signed)
Patient informed. 

## 2022-12-14 NOTE — Addendum Note (Signed)
Addended by: Courtney Heys on: 12/14/2022 03:09 PM   Modules accepted: Orders

## 2022-12-14 NOTE — Telephone Encounter (Signed)
Patient wanted to let Dr. Dagoberto Ligas that taking 1 and 1/2 of another hydrocodone worked well for patient.  She will need a refill on that medication.  Any questions, please call patient @ [phone].      Filled  Written  ID  Drug  QTY  Days  Prescriber  RX #  Dispenser  Refill  Daily Dose*  Pymt Type  PMP  11/17/2022 11/13/2022 1  Hydrocodone-Acetamin 5-325 Mg 120.00 30 Eu Tho [id number] [facility] ([id number]) 0/0 20.00 MME Medicare [place] 10/19/2022 10/17/2022 1  Hydrocodone-Acetamin 5-325 Mg 120.00 30 Eu Tho [id number] [facility] ([id number]) 0/0 20.00 MME Medicare [place]

## 2022-12-14 NOTE — Telephone Encounter (Signed)
Patient wanted to let Dr. Dagoberto Ligas that taking 1 and 1/2 of another hydrocodone worked well for patient.  She will need a refill on that medication.  Any questions, please call patient @ [phone].

## 2022-12-18 ENCOUNTER — Ambulatory Visit (HOSPITAL_BASED_OUTPATIENT_CLINIC_OR_DEPARTMENT_OTHER): Admission: RE | Admit: 2022-12-18 | Payer: 59 | Source: Home / Self Care | Admitting: Otolaryngology

## 2022-12-18 DIAGNOSIS — Z01818 Encounter for other preprocedural examination: Secondary | ICD-10-CM

## 2022-12-18 SURGERY — MICROLARYNGOSCOPY
Anesthesia: General | Laterality: Bilateral

## 2023-01-10 ENCOUNTER — Telehealth: Payer: Self-pay

## 2023-01-10 MED ORDER — HYDROCODONE-ACETAMINOPHEN 7.5-325 MG PO TABS: 1.0000 | ORAL_TABLET | Freq: Four times a day (QID) | ORAL | 0 refills | Status: DC | PRN

## 2023-01-10 NOTE — Telephone Encounter (Signed)
Patient requesting refill on hydrocodone and also wants to move forward with the back injections as well

## 2023-01-15 NOTE — Telephone Encounter (Signed)
Called pt advised and left message to call our office for a consult w/ dr Letta Pate for an [facility]

## 2023-01-21 ENCOUNTER — Telehealth: Payer: Self-pay

## 2023-01-21 DIAGNOSIS — M542 Cervicalgia: Secondary | ICD-10-CM

## 2023-01-21 NOTE — Telephone Encounter (Signed)
Please advise 

## 2023-01-21 NOTE — Addendum Note (Signed)
Addended by: Meyer Cory on: 01/21/2023 04:27 PM   Modules accepted: Orders

## 2023-01-21 NOTE — Telephone Encounter (Signed)
Patient called triage and left message. She states that she is ready to try spine injections now. She states that she was told to call us and let us know. [phone]

## 2023-01-21 NOTE — Telephone Encounter (Signed)
MRI order entered. I left voicemail advising patient of plan. Asked for return call if she has questions.

## 2023-01-22 ENCOUNTER — Telehealth: Payer: Self-pay | Admitting: Orthopaedic Surgery

## 2023-01-22 DIAGNOSIS — M545 Low back pain, unspecified: Secondary | ICD-10-CM

## 2023-01-22 NOTE — Telephone Encounter (Signed)
Pt returning a call to [facility]. Pease call pt at [phone].

## 2023-01-22 NOTE — Telephone Encounter (Signed)
Referral cancelled. 

## 2023-01-22 NOTE — Telephone Encounter (Signed)
Is requesting ESI of lumbar spine. She states that she is unable to walk due to low back pain. Please advise.  The MRI that was discussed at last office visit was for cervical spine. She states that problem has cleared up. She does not wish to have the MRI cervical spine.

## 2023-01-22 NOTE — Telephone Encounter (Signed)
Referral entered for lumbar ESI. Message sent to Sabrina to cancel MRI cervical spine. I left voicemail for patient advising.

## 2023-02-04 ENCOUNTER — Ambulatory Visit (INDEPENDENT_AMBULATORY_CARE_PROVIDER_SITE_OTHER): Payer: 59 | Admitting: Physical Medicine and Rehabilitation

## 2023-02-04 ENCOUNTER — Ambulatory Visit: Payer: Self-pay

## 2023-02-04 VITALS — BP 133/89 | HR 70

## 2023-02-04 DIAGNOSIS — M48062 Spinal stenosis, lumbar region with neurogenic claudication: Secondary | ICD-10-CM

## 2023-02-04 DIAGNOSIS — M5416 Radiculopathy, lumbar region: Secondary | ICD-10-CM

## 2023-02-04 MED ORDER — METHYLPREDNISOLONE ACETATE 80 MG/ML IJ SUSP
80.0000 mg | Freq: Once | INTRAMUSCULAR | Status: AC
  Administered 2023-02-04: 80 mg

## 2023-02-04 NOTE — Procedures (Signed)
Lumbar Epidural Steroid Injection - Interlaminar Approach with Fluoroscopic Guidance  Patient: Sheri Rivera      Date of Birth: 12-03-1960 MRN: [id number] PCP: Hayden Rasmussen, MD      Visit Date: 02/04/2023   Universal Protocol:     Consent Given By: the patient  Position: PRONE  Additional Comments: Vital signs were monitored before and after the procedure. Patient was prepped and draped in the usual sterile fashion. The correct patient, procedure, and site was verified.   Injection Procedure Details:   Procedure diagnoses: Lumbar radiculopathy [M54.16]   Meds Administered:  Meds ordered this encounter  Medications   methylPREDNISolone acetate (DEPO-MEDROL) injection 80 mg     Laterality: Right  Location/Site:  L4-5  Needle: 3.5 in., 20 ga. Tuohy  Needle Placement: Paramedian epidural  Findings:   -Comments: Excellent flow of contrast into the epidural space.  Procedure Details: Using a paramedian approach from the side mentioned above, the region overlying the inferior lamina was localized under fluoroscopic visualization and the soft tissues overlying this structure were infiltrated with 4 ml. of 1% Lidocaine without Epinephrine. The Tuohy needle was inserted into the epidural space using a paramedian approach.   The epidural space was localized using loss of resistance along with counter oblique bi-planar fluoroscopic views.  After negative aspirate for air, blood, and CSF, a 2 ml. volume of Isovue-250 was injected into the epidural space and the flow of contrast was observed. Radiographs were obtained for documentation purposes.    The injectate was administered into the level noted above.   Additional Comments:  The patient tolerated the procedure well Dressing: 2 x 2 sterile gauze and Band-Aid    Post-procedure details: Patient was observed during the procedure. Post-procedure instructions were reviewed.  Patient left the clinic in stable  condition.

## 2023-02-04 NOTE — Progress Notes (Signed)
Functional Pain Scale - descriptive words and definitions  Intense (8)    Cannot complete any ADLs without much assistance/cannot concentrate/conversation is difficult/unable to sleep and unable to use distraction. Severe range order  Average Pain 9   +Driver, -BT, -Dye Allergies.  Lower back pain in the middle that radiates down both legs, mainly the right

## 2023-02-04 NOTE — Progress Notes (Signed)
Sheri Rivera - 62 y.o. female MRN [id number]  Date of birth: 1960/12/28  Office Visit Note: Visit Date: 02/04/2023 PCP: Hayden Rasmussen, MD Referred by: Hayden Rasmussen, MD  Subjective: Chief Complaint  Patient presents with   Lower Back - Pain   HPI:  PATRICIE DARIO is a 62 y.o. female who comes in today at the request of Dr. Rodell Perna for planned Right L4-5 Lumbar Interlaminar epidural steroid injection with fluoroscopic guidance.  The patient has failed conservative care including home exercise, medications, time and activity modification.  This injection will be diagnostic and hopefully therapeutic.  Please see requesting physician notes for further details and justification.   ROS Otherwise per HPI.  Assessment & Plan: Visit Diagnoses:    ICD-10-CM   1. Lumbar radiculopathy  M54.16 XR C-ARM NO REPORT    Epidural Steroid injection    methylPREDNISolone acetate (DEPO-MEDROL) injection 80 mg    2. Spinal stenosis of lumbar region with neurogenic claudication  M48.062       Plan: No additional findings.   Meds & Orders:  Meds ordered this encounter  Medications   methylPREDNISolone acetate (DEPO-MEDROL) injection 80 mg    Orders Placed This Encounter  Procedures   XR C-ARM NO REPORT   Epidural Steroid injection    Follow-up: Return for visit to requesting provider as needed.   Procedures: No procedures performed  Lumbar Epidural Steroid Injection - Interlaminar Approach with Fluoroscopic Guidance  Patient: DELBRA MAYBERRY      Date of Birth: Oct 16, 1961 MRN: [id number] PCP: Hayden Rasmussen, MD      Visit Date: 02/04/2023   Universal Protocol:     Consent Given By: the patient  Position: PRONE  Additional Comments: Vital signs were monitored before and after the procedure. Patient was prepped and draped in the usual sterile fashion. The correct patient, procedure, and site was verified.   Injection Procedure Details:   Procedure diagnoses:  Lumbar radiculopathy [M54.16]   Meds Administered:  Meds ordered this encounter  Medications   methylPREDNISolone acetate (DEPO-MEDROL) injection 80 mg     Laterality: Right  Location/Site:  L4-5  Needle: 3.5 in., 20 ga. Tuohy  Needle Placement: Paramedian epidural  Findings:   -Comments: Excellent flow of contrast into the epidural space.  Procedure Details: Using a paramedian approach from the side mentioned above, the region overlying the inferior lamina was localized under fluoroscopic visualization and the soft tissues overlying this structure were infiltrated with 4 ml. of 1% Lidocaine without Epinephrine. The Tuohy needle was inserted into the epidural space using a paramedian approach.   The epidural space was localized using loss of resistance along with counter oblique bi-planar fluoroscopic views.  After negative aspirate for air, blood, and CSF, a 2 ml. volume of Isovue-250 was injected into the epidural space and the flow of contrast was observed. Radiographs were obtained for documentation purposes.    The injectate was administered into the level noted above.   Additional Comments:  The patient tolerated the procedure well Dressing: 2 x 2 sterile gauze and Band-Aid    Post-procedure details: Patient was observed during the procedure. Post-procedure instructions were reviewed.  Patient left the clinic in stable condition.   Clinical History: MRI LUMBAR SPINE WITHOUT CONTRAST   TECHNIQUE: Multiplanar, multisequence MR imaging of the lumbar spine was performed. No intravenous contrast was administered.   COMPARISON:  Lumbar MRI 04/15/2018   FINDINGS: Segmentation:  5 lumbar vertebra   Alignment:  Normal  Vertebrae: Negative for acute fracture or mass. Chronic fracture of L3 unchanged from the prior study   Conus medullaris and cauda equina: Conus extends to the L2 level. Conus and cauda equina appear normal.   Paraspinal and other soft tissues:  Negative for paraspinous mass or adenopathy   Disc levels:   L1-2: Negative   L2-3: Mild disc degeneration with disc space narrowing and mild spurring. Negative for stenosis   L3-4: Mild disc bulging and mild facet degeneration. Mild narrowing of the central canal without significant stenosis   L4-5: Disc degeneration with disc space narrowing and disc bulging. Left foraminal disc protrusion similar to the prior study. Moderate left foraminal narrowing. Bilateral facet degeneration. Moderate central canal stenosis with mild progression from the prior study.   L5-S1: Moderate disc degeneration with disc space narrowing and endplate spurring. Moderate right foraminal narrowing due to disc and osteophyte. No change from the prior study.   IMPRESSION: Mild degenerative change L2-3 and L3-4 without significant stenosis   Left foraminal disc protrusion L4-5 with moderate left foraminal narrowing unchanged from the prior study. Moderate central canal stenosis with mild progression from 2019   Right foraminal encroachment L5-S1 due to disc and osteophyte complex, unchanged     Electronically Signed   By: Franchot Gallo M.D.   On: 04/24/2022 10:58     Objective:  VS:  HT:    WT:   BMI:     BP:133/89  HR:70bpm  TEMP: ( )  RESP:  Physical Exam Vitals and nursing note reviewed.  Constitutional:      General: She is not in acute distress.    Appearance: Normal appearance. She is not ill-appearing.  HENT:     Head: Normocephalic and atraumatic.     Right Ear: External ear normal.     Left Ear: External ear normal.  Eyes:     Extraocular Movements: Extraocular movements intact.  Cardiovascular:     Rate and Rhythm: Normal rate.     Pulses: Normal pulses.  Pulmonary:     Effort: Pulmonary effort is normal. No respiratory distress.  Abdominal:     General: There is no distension.     Palpations: Abdomen is soft.  Musculoskeletal:        General: Tenderness present.      Cervical back: Neck supple.     Right lower leg: No edema.     Left lower leg: No edema.     Comments: Patient has good distal strength with no pain over the greater trochanters.  No clonus or focal weakness.  Skin:    Findings: No erythema, lesion or rash.  Neurological:     General: No focal deficit present.     Mental Status: She is alert and oriented to person, place, and time.     Sensory: No sensory deficit.     Motor: No weakness or abnormal muscle tone.     Coordination: Coordination normal.  Psychiatric:        Mood and Affect: Mood normal.        Behavior: Behavior normal.      Imaging: XR C-ARM NO REPORT  Result Date: 02/04/2023 Please see Notes tab for imaging impression.

## 2023-02-04 NOTE — Patient Instructions (Signed)

## 2023-02-07 ENCOUNTER — Telehealth: Payer: Self-pay | Admitting: *Deleted

## 2023-02-07 MED ORDER — HYDROCODONE-ACETAMINOPHEN 7.5-325 MG PO TABS: 1.0000 | ORAL_TABLET | Freq: Four times a day (QID) | ORAL | 0 refills | Status: DC | PRN

## 2023-02-07 NOTE — Telephone Encounter (Signed)
Sheri Rivera called for a refill on her hydrocodone. Per PMP last refill was 01/14/23. She notes we asked her to call 4-5 days before being out.

## 2023-02-26 ENCOUNTER — Encounter: Payer: Self-pay | Admitting: Family Medicine

## 2023-02-26 ENCOUNTER — Other Ambulatory Visit: Payer: Self-pay | Admitting: Family Medicine

## 2023-02-26 DIAGNOSIS — Z122 Encounter for screening for malignant neoplasm of respiratory organs: Secondary | ICD-10-CM

## 2023-02-26 DIAGNOSIS — R911 Solitary pulmonary nodule: Secondary | ICD-10-CM

## 2023-03-11 ENCOUNTER — Encounter: Payer: 59 | Admitting: Physical Medicine and Rehabilitation

## 2023-03-14 ENCOUNTER — Telehealth: Payer: Self-pay | Admitting: Physical Medicine and Rehabilitation

## 2023-03-14 ENCOUNTER — Telehealth: Payer: Self-pay | Admitting: *Deleted

## 2023-03-14 MED ORDER — HYDROCODONE-ACETAMINOPHEN 7.5-325 MG PO TABS: 1.0000 | ORAL_TABLET | Freq: Four times a day (QID) | ORAL | 0 refills | Status: DC | PRN

## 2023-03-14 NOTE — Telephone Encounter (Signed)
Patient calling requesting refill on Hydrocodone. She did reschedule 03-29-2023 appointment due to death of her son and she has not gotten out of the house.

## 2023-03-14 NOTE — Telephone Encounter (Signed)
Left detailed message as per Dr. Dahlia Client response.

## 2023-03-14 NOTE — Telephone Encounter (Signed)
Patient asking for another epidural injection. Please advise

## 2023-03-14 NOTE — Telephone Encounter (Signed)
LVM to return call to get more information 

## 2023-03-15 ENCOUNTER — Telehealth: Payer: Self-pay | Admitting: Physical Medicine and Rehabilitation

## 2023-03-15 ENCOUNTER — Ambulatory Visit: Payer: 59 | Admitting: Physical Medicine and Rehabilitation

## 2023-03-15 NOTE — Telephone Encounter (Signed)
Patient called back. Says the last injection helped her 80-90%. Her call back number is [phone]

## 2023-03-18 ENCOUNTER — Other Ambulatory Visit: Payer: Self-pay | Admitting: Physical Medicine and Rehabilitation

## 2023-03-18 DIAGNOSIS — M5416 Radiculopathy, lumbar region: Secondary | ICD-10-CM

## 2023-03-18 NOTE — Telephone Encounter (Signed)
Patient returned call and stated the pain just returned and the last injection helped 80-90%. Please advise

## 2023-03-18 NOTE — Telephone Encounter (Signed)
See previous encounter

## 2023-04-04 ENCOUNTER — Other Ambulatory Visit: Payer: Self-pay

## 2023-04-04 ENCOUNTER — Ambulatory Visit (INDEPENDENT_AMBULATORY_CARE_PROVIDER_SITE_OTHER): Payer: 59 | Admitting: Physical Medicine and Rehabilitation

## 2023-04-04 DIAGNOSIS — M5416 Radiculopathy, lumbar region: Secondary | ICD-10-CM | POA: Diagnosis not present

## 2023-04-04 MED ORDER — METHYLPREDNISOLONE ACETATE 80 MG/ML IJ SUSP
80.0000 mg | Freq: Once | INTRAMUSCULAR | Status: AC
  Administered 2023-04-04: 80 mg

## 2023-04-04 NOTE — Patient Instructions (Signed)

## 2023-04-04 NOTE — Progress Notes (Signed)
Functional Pain Scale - descriptive words and definitions  Distracting (5)    Aware of pain/able to complete some ADL's but limited by pain/sleep is affected and active distractions are only slightly useful. Moderate range order  Average Pain 8-9   +Driver, -BT, -Dye Allergies.  Lower back pain on left with numbness and trembling in both legs

## 2023-04-05 ENCOUNTER — Other Ambulatory Visit: Payer: Self-pay | Admitting: Otolaryngology

## 2023-04-05 DIAGNOSIS — C321 Malignant neoplasm of supraglottis: Secondary | ICD-10-CM

## 2023-04-12 ENCOUNTER — Telehealth: Payer: Self-pay | Admitting: *Deleted

## 2023-04-12 MED ORDER — HYDROCODONE-ACETAMINOPHEN 7.5-325 MG PO TABS: 1.0000 | ORAL_TABLET | Freq: Four times a day (QID) | ORAL | 0 refills | Status: DC | PRN

## 2023-04-12 NOTE — Telephone Encounter (Signed)
Requesting a refill on her hydrocodone.

## 2023-04-15 ENCOUNTER — Inpatient Hospital Stay: Admission: RE | Admit: 2023-04-15 | Payer: 59 | Source: Ambulatory Visit

## 2023-04-16 NOTE — Procedures (Signed)
Lumbosacral Transforaminal Epidural Steroid Injection - Sub-Pedicular Approach with Fluoroscopic Guidance  Patient: Sheri Rivera      Date of Birth: 25-Dec-1960 MRN: [id number] PCP: Dois Davenport, MD      Visit Date: 04/04/2023   Universal Protocol:    Date/Time: 04/04/2023  Consent Given By: the patient  Position: PRONE  Additional Comments: Vital signs were monitored before and after the procedure. Patient was prepped and draped in the usual sterile fashion. The correct patient, procedure, and site was verified.   Injection Procedure Details:   Procedure diagnoses: Lumbar radiculopathy [M54.16]    Meds Administered:  Meds ordered this encounter  Medications   methylPREDNISolone acetate (DEPO-MEDROL) injection 80 mg    Laterality: Bilateral  Location/Site: L4  Needle:5.0 in., 22 ga.  Short bevel or Quincke spinal needle  Needle Placement: Transforaminal  Findings:    -Comments: Excellent flow of contrast along the nerve, nerve root and into the epidural space.  Procedure Details: After squaring off the end-plates to get a true AP view, the C-arm was positioned so that an oblique view of the foramen as noted above was visualized. The target area is just inferior to the "nose of the scotty dog" or sub pedicular. The soft tissues overlying this structure were infiltrated with 2-3 ml. of 1% Lidocaine without Epinephrine.  The spinal needle was inserted toward the target using a "trajectory" view along the fluoroscope beam.  Under AP and lateral visualization, the needle was advanced so it did not puncture dura and was located close the 6 O'Clock position of the pedical in AP tracterory. Biplanar projections were used to confirm position. Aspiration was confirmed to be negative for CSF and/or blood. A 1-2 ml. volume of Isovue-250 was injected and flow of contrast was noted at each level. Radiographs were obtained for documentation purposes.   After attaining the  desired flow of contrast documented above, a 0.5 to 1.0 ml test dose of 0.25% Marcaine was injected into each respective transforaminal space.  The patient was observed for 90 seconds post injection.  After no sensory deficits were reported, and normal lower extremity motor function was noted,   the above injectate was administered so that equal amounts of the injectate were placed at each foramen (level) into the transforaminal epidural space.   Additional Comments:  No complications occurred Dressing: 2 x 2 sterile gauze and Band-Aid    Post-procedure details: Patient was observed during the procedure. Post-procedure instructions were reviewed.  Patient left the clinic in stable condition.

## 2023-04-16 NOTE — Progress Notes (Signed)
Sheri Rivera - 62 y.o. female MRN [id number]  Date of birth: 18-Sep-1961  Office Visit Note: Visit Date: 04/04/2023 PCP: Dois Davenport, MD Referred by: Dois Davenport, MD  Subjective: Chief Complaint  Patient presents with   Lower Back - Pain   HPI:  Sheri Rivera is a 61 y.o. female who comes in today at the request of Dr. Annell Greening for planned Bilateral L4-5 Lumbar Transforaminal epidural steroid injection with fluoroscopic guidance.  The patient has failed conservative care including home exercise, medications, time and activity modification.  This injection will be diagnostic and hopefully therapeutic.  Please see requesting physician notes for further details and justification.   ROS Otherwise per HPI.  Assessment & Plan: Visit Diagnoses:    ICD-10-CM   1. Lumbar radiculopathy  M54.16 XR C-ARM NO REPORT    Epidural Steroid injection    methylPREDNISolone acetate (DEPO-MEDROL) injection 80 mg      Plan: No additional findings.   Meds & Orders:  Meds ordered this encounter  Medications   methylPREDNISolone acetate (DEPO-MEDROL) injection 80 mg    Orders Placed This Encounter  Procedures   XR C-ARM NO REPORT   Epidural Steroid injection    Follow-up: Return for visit to requesting provider as needed.   Procedures: No procedures performed  Lumbosacral Transforaminal Epidural Steroid Injection - Sub-Pedicular Approach with Fluoroscopic Guidance  Patient: Sheri Rivera      Date of Birth: 1961/06/27 MRN: [id number] PCP: Dois Davenport, MD      Visit Date: 04/04/2023   Universal Protocol:    Date/Time: 04/04/2023  Consent Given By: the patient  Position: PRONE  Additional Comments: Vital signs were monitored before and after the procedure. Patient was prepped and draped in the usual sterile fashion. The correct patient, procedure, and site was verified.   Injection Procedure Details:   Procedure diagnoses: Lumbar radiculopathy [M54.16]     Meds Administered:  Meds ordered this encounter  Medications   methylPREDNISolone acetate (DEPO-MEDROL) injection 80 mg    Laterality: Bilateral  Location/Site: L4  Needle:5.0 in., 22 ga.  Short bevel or Quincke spinal needle  Needle Placement: Transforaminal  Findings:    -Comments: Excellent flow of contrast along the nerve, nerve root and into the epidural space.  Procedure Details: After squaring off the end-plates to get a true AP view, the C-arm was positioned so that an oblique view of the foramen as noted above was visualized. The target area is just inferior to the "nose of the scotty dog" or sub pedicular. The soft tissues overlying this structure were infiltrated with 2-3 ml. of 1% Lidocaine without Epinephrine.  The spinal needle was inserted toward the target using a "trajectory" view along the fluoroscope beam.  Under AP and lateral visualization, the needle was advanced so it did not puncture dura and was located close the 6 O'Clock position of the pedical in AP tracterory. Biplanar projections were used to confirm position. Aspiration was confirmed to be negative for CSF and/or blood. A 1-2 ml. volume of Isovue-250 was injected and flow of contrast was noted at each level. Radiographs were obtained for documentation purposes.   After attaining the desired flow of contrast documented above, a 0.5 to 1.0 ml test dose of 0.25% Marcaine was injected into each respective transforaminal space.  The patient was observed for 90 seconds post injection.  After no sensory deficits were reported, and normal lower extremity motor function was noted,   the above injectate  was administered so that equal amounts of the injectate were placed at each foramen (level) into the transforaminal epidural space.   Additional Comments:  No complications occurred Dressing: 2 x 2 sterile gauze and Band-Aid    Post-procedure details: Patient was observed during the procedure. Post-procedure  instructions were reviewed.  Patient left the clinic in stable condition.    Clinical History: MRI LUMBAR SPINE WITHOUT CONTRAST   TECHNIQUE: Multiplanar, multisequence MR imaging of the lumbar spine was performed. No intravenous contrast was administered.   COMPARISON:  Lumbar MRI 04/15/2018   FINDINGS: Segmentation:  5 lumbar vertebra   Alignment:  Normal   Vertebrae: Negative for acute fracture or mass. Chronic fracture of L3 unchanged from the prior study   Conus medullaris and cauda equina: Conus extends to the L2 level. Conus and cauda equina appear normal.   Paraspinal and other soft tissues: Negative for paraspinous mass or adenopathy   Disc levels:   L1-2: Negative   L2-3: Mild disc degeneration with disc space narrowing and mild spurring. Negative for stenosis   L3-4: Mild disc bulging and mild facet degeneration. Mild narrowing of the central canal without significant stenosis   L4-5: Disc degeneration with disc space narrowing and disc bulging. Left foraminal disc protrusion similar to the prior study. Moderate left foraminal narrowing. Bilateral facet degeneration. Moderate central canal stenosis with mild progression from the prior study.   L5-S1: Moderate disc degeneration with disc space narrowing and endplate spurring. Moderate right foraminal narrowing due to disc and osteophyte. No change from the prior study.   IMPRESSION: Mild degenerative change L2-3 and L3-4 without significant stenosis   Left foraminal disc protrusion L4-5 with moderate left foraminal narrowing unchanged from the prior study. Moderate central canal stenosis with mild progression from 2019   Right foraminal encroachment L5-S1 due to disc and osteophyte complex, unchanged     Electronically Signed   By: Marlan Palau M.D.   On: 04/24/2022 10:58     Objective:  VS:  HT:    WT:   BMI:     BP:   HR: bpm  TEMP: ( )  RESP:  Physical Exam Vitals and nursing note  reviewed.  Constitutional:      General: She is not in acute distress.    Appearance: Normal appearance. She is not ill-appearing.  HENT:     Head: Normocephalic and atraumatic.     Right Ear: External ear normal.     Left Ear: External ear normal.  Eyes:     Extraocular Movements: Extraocular movements intact.  Cardiovascular:     Rate and Rhythm: Normal rate.     Pulses: Normal pulses.  Pulmonary:     Effort: Pulmonary effort is normal. No respiratory distress.  Abdominal:     General: There is no distension.     Palpations: Abdomen is soft.  Musculoskeletal:        General: Tenderness present.     Cervical back: Neck supple.     Right lower leg: No edema.     Left lower leg: No edema.     Comments: Patient has good distal strength with no pain over the greater trochanters.  No clonus or focal weakness.  Skin:    Findings: No erythema, lesion or rash.  Neurological:     General: No focal deficit present.     Mental Status: She is alert and oriented to person, place, and time.     Sensory: No sensory deficit.  Motor: No weakness or abnormal muscle tone.     Coordination: Coordination normal.  Psychiatric:        Mood and Affect: Mood normal.        Behavior: Behavior normal.      Imaging: No results found.

## 2023-04-23 ENCOUNTER — Ambulatory Visit: Payer: 59 | Admitting: Physical Medicine and Rehabilitation

## 2023-05-13 ENCOUNTER — Inpatient Hospital Stay: Admission: RE | Admit: 2023-05-13 | Payer: 59 | Source: Ambulatory Visit

## 2023-05-17 ENCOUNTER — Telehealth: Payer: Self-pay

## 2023-05-17 MED ORDER — HYDROCODONE-ACETAMINOPHEN 7.5-325 MG PO TABS: 1.0000 | ORAL_TABLET | Freq: Four times a day (QID) | ORAL | 0 refills | Status: DC | PRN

## 2023-05-17 NOTE — Telephone Encounter (Signed)
Patient requesting Hydrocodone 7.5-325 refill. Please send to [facility] on [address]. Per patient she will be out the Rx on Sunday.   Filled  Written  ID  Drug  QTY  Days  Prescriber  RX #  Dispenser  Refill  Daily Dose*  Pymt Type  PMP  04/18/2023 04/12/2023 1  Hydrocodone-Acetamin 7.5-325 120.00 30 Me Lov [id number] [facility] (0531) 0/0 30.00 MME Medicare [place] 03/15/2023 03/14/2023 1  Hydrocodone-Acetamin 7.5-325 120.00 30 Me Lov [id number] [facility] (0531) 0/0 30.00 MME Medicare [place] 02/13/2023 02/07/2023 1  Hydrocodone-Acetamin 7.5-325 120.00 30 Me Lov [id number] [facility] (0531) 0/0 30.00 MME Medicare 

## 2023-05-17 NOTE — Telephone Encounter (Signed)
Pt.notified

## 2023-05-31 ENCOUNTER — Encounter: Payer: 59 | Attending: Physical Medicine and Rehabilitation | Admitting: Physical Medicine and Rehabilitation

## 2023-06-12 ENCOUNTER — Telehealth: Payer: Self-pay | Admitting: *Deleted

## 2023-06-12 ENCOUNTER — Inpatient Hospital Stay: Admission: RE | Admit: 2023-06-12 | Payer: 59 | Source: Ambulatory Visit

## 2023-06-12 MED ORDER — HYDROCODONE-ACETAMINOPHEN 7.5-325 MG PO TABS: 1.0000 | ORAL_TABLET | Freq: Four times a day (QID) | ORAL | 0 refills | Status: DC | PRN

## 2023-06-12 NOTE — Telephone Encounter (Signed)
Patient requesting refill of Hydrocodone at [facility] [address] Call back [phone]. Last order written 05/17/23.

## 2023-06-12 NOTE — Telephone Encounter (Signed)
Patient notified

## 2023-07-01 ENCOUNTER — Inpatient Hospital Stay: Admission: RE | Admit: 2023-07-01 | Payer: 59 | Source: Ambulatory Visit

## 2023-07-01 ENCOUNTER — Telehealth: Payer: Self-pay

## 2023-07-01 NOTE — Telephone Encounter (Signed)
Spoke with patient and she is requesting another injection. She stated the last injection on 04/04/23 lasted until 3 weeks ago and she received at least 75% relief. No new falls, accidents, or injuries. Please advise

## 2023-07-02 ENCOUNTER — Other Ambulatory Visit: Payer: Self-pay | Admitting: Physical Medicine and Rehabilitation

## 2023-07-02 DIAGNOSIS — M5416 Radiculopathy, lumbar region: Secondary | ICD-10-CM

## 2023-07-05 ENCOUNTER — Telehealth: Payer: Self-pay | Admitting: Physical Medicine and Rehabilitation

## 2023-07-05 NOTE — Telephone Encounter (Signed)
Patient called needing to schedule an appointment with Dr. Alvester Morin for her back. The number to contact patient is [phone]

## 2023-07-08 ENCOUNTER — Telehealth: Payer: Self-pay | Admitting: Physical Medicine and Rehabilitation

## 2023-07-08 NOTE — Telephone Encounter (Signed)
LVM to return call to schedule injection 

## 2023-07-08 NOTE — Telephone Encounter (Signed)
Patient returned call asked for a call back to schedule an appt with Dr Alvester Morin for her back. Phone number [phone]

## 2023-07-08 NOTE — Telephone Encounter (Signed)
Patient called returning your call. [phone]

## 2023-07-08 NOTE — Telephone Encounter (Signed)
Spoke with patient and scheduled injection for 07/23/23. Patient aware driver needed

## 2023-07-09 ENCOUNTER — Telehealth: Payer: Self-pay

## 2023-07-09 NOTE — Telephone Encounter (Signed)
Patient called stating that she is needing a lift chair cause of her mobility has declined. She is going to make an appt.

## 2023-07-12 NOTE — Telephone Encounter (Signed)
Wrote an Rx for pt- thanks- ML

## 2023-07-12 NOTE — Telephone Encounter (Signed)
Yes that is what she's talking about. She just needs a Rx for it.

## 2023-07-15 ENCOUNTER — Telehealth: Payer: Self-pay | Admitting: Physical Medicine and Rehabilitation

## 2023-07-15 NOTE — Telephone Encounter (Signed)
[employer] called asking for prior Auth for lift chair. Please call [phone] to submit prior auth.

## 2023-07-16 NOTE — Telephone Encounter (Signed)
I have let [name] know of Dr Dahlia Client response. She will discuss at next appointment.

## 2023-07-17 ENCOUNTER — Telehealth: Payer: Self-pay | Admitting: Orthopaedic Surgery

## 2023-07-17 NOTE — Telephone Encounter (Signed)
I called and talked to the pt. She stated she is very concerned about her decrease in her mobility. States she is unable to even get out of bed or into a car without assistance. I advised Dr. Ophelia Charter is out of the office but I would see if you had any thoughts on this. I scheduled her a appt with Dr. Ophelia Charter on 9/6. Please advise

## 2023-07-17 NOTE — Telephone Encounter (Signed)
Patient called needing a back brace and also need authorization to get lift chair.[phone]

## 2023-07-18 NOTE — Telephone Encounter (Signed)
Lvm for pt to cb 

## 2023-07-18 NOTE — Telephone Encounter (Signed)
Pt was advised and stated understanding  

## 2023-07-22 ENCOUNTER — Ambulatory Visit (INDEPENDENT_AMBULATORY_CARE_PROVIDER_SITE_OTHER): Payer: 59 | Admitting: Orthopedic Surgery

## 2023-07-22 DIAGNOSIS — M1611 Unilateral primary osteoarthritis, right hip: Secondary | ICD-10-CM | POA: Diagnosis not present

## 2023-07-22 DIAGNOSIS — R29898 Other symptoms and signs involving the musculoskeletal system: Secondary | ICD-10-CM

## 2023-07-22 NOTE — Progress Notes (Signed)
Orthopedic Spine Surgery Office Note  Assessment: Patient is a 62 y.o. female with multiple areas with pain but her worst at this time seems to be coming from the right hip   Plan: -Patient has tried tylenol, ibuprofen, narcotics, lumbar steroid injections  -Pain has gotten to the point that it is interfering with her daily activities and ability to even get out of a chair, so told her that surgery may be an option for her -Wrote a prescription for a lift chair since she cannot get herself out of a chair and has had to call her children to help her get her out of a chair -She should continue with her lumbar injection -Would need to be nicotine free prior to any elective spine surgery -Patient should follow-up with Dr. Ophelia Charter after her lumbar injection   Patient expressed understanding of the plan and all questions were answered to the patient's satisfaction.   ___________________________________________________________________________   History:  Patient is a 62 y.o. female who presents today for lumbar spine.  Patient has had several years of low back and right hip pain.  It has gotten progressively worse with time.  She comes in today because she is now having difficulty walking and even getting out of chairs.  She says she has had to call her children to help her get her out of a chair several times now because of pain in her right hip and back when trying to get out of the chair.  She feels that her legs will lock up on her at times.  Pain is severe enough that she is now on chronic narcotics.  Feels that her legs have gotten weaker with time.  Has not had any bowel or bladder incontinence.  No saddle anesthesia.   Past Medical History:  Diagnosis Date   Anxiety 11/11/2011   Arthritis    Asthma    Bursitis    right shoulder   Chronic back pain    Chronic neck pain    Chronic shoulder pain    COPD (chronic obstructive pulmonary disease) (HCC)    continues to smoke 1ppd   DDD  (degenerative disc disease)    Depression    Fibromyalgia    GERD (gastroesophageal reflux disease)    Heart murmur    as a child per pt   Herpes    History of radiation therapy 08/12/18- 10/03/18   Larynx, prescribed dose of 63 Gy in 28 fractions. She chose to stop treatments after 27 fractions.    IBS (irritable bowel syndrome)    Lumbar radiculopathy    Seasonal allergies    Smoker    Thyroid nodule    goiters     Allergies: Clindamycin, Cymbalta, Levaquin, Lyrica, sulfa, codeine  Past Surgical History:  Procedure Laterality Date   CHOLECYSTECTOMY     DIRECT LARYNGOSCOPY N/A 07/11/2018   Procedure: DIRECT LARYNGOSCOPY WITH BIOPSY;  Surgeon: Drema Halon, MD;  Location: [facility];  Service: ENT;  Laterality: N/A;   HERNIA REPAIR     Hiatal    HIATAL HERNIA REPAIR     TUBAL LIGATION  1983     Social history: Reports use of nicotine product (smoking, vaping, patches, smokeless) Alcohol use: denies Denies recreational drug use   Physical Exam:  General: no acute distress, appears stated age Neurologic: alert, answering questions appropriately, following commands Respiratory: unlabored breathing on room air, symmetric chest rise Psychiatric: appropriate affect, normal cadence to speech   MSK (spine):  -Strength  exam      Left  Right EHL    5/5  5/5 TA    5/5  5/5 GSC    5/5  5/5 Knee extension  5/5  5/5 Hip flexion   5/5  5/5  -Sensory exam    Sensation intact to light touch in L3-S1 nerve distributions of bilateral lower extremities  -Achilles DTR: 2/4 on the left, 2/4 on the right -Patellar tendon DTR: 2/4 on the left, 2/4 on the right  -Straight leg raise: Negative bilaterally -Femoral nerve stretch test: Negative bilaterally -Clonus: no beats bilaterally  -Left hip exam: Positive FADIR, no pain through range of motion, negative Stinchfield, negative FABER -Right hip exam: Positive FADIR, pain with extremes of internal and  external rotation, positive Stinchfield, negative FABER  Imaging: MRI of the lumbar spine from 04/21/2022 was independently reviewed and interpreted, showing central stenosis at L3/4, central and lateral recess stenosis at L4/5.  Foraminal stenosis bilaterally at L4/5.  Right-sided foraminal stenosis at L5/S1.  XRs of the pelvis from 03/15/2022 was independently reviewed and interpreted, showing bilateral degenerative changes within the hip joints but more significant on the right side.  There is joint space narrowing seen. No fracture or dislocation seen.    Patient name: Sheri Rivera Patient MRN: [id number] Date of visit: 07/22/23

## 2023-07-23 ENCOUNTER — Ambulatory Visit (INDEPENDENT_AMBULATORY_CARE_PROVIDER_SITE_OTHER): Payer: 59 | Admitting: Physical Medicine and Rehabilitation

## 2023-07-23 ENCOUNTER — Other Ambulatory Visit: Payer: Self-pay

## 2023-07-23 ENCOUNTER — Ambulatory Visit
Admission: RE | Admit: 2023-07-23 | Discharge: 2023-07-23 | Disposition: A | Discharge status: Home / Self Care | Payer: 59 | Source: Ambulatory Visit | Attending: Family Medicine | Admitting: Family Medicine

## 2023-07-23 VITALS — BP 117/77 | HR 88

## 2023-07-23 DIAGNOSIS — M25551 Pain in right hip: Secondary | ICD-10-CM | POA: Diagnosis not present

## 2023-07-23 DIAGNOSIS — Z122 Encounter for screening for malignant neoplasm of respiratory organs: Secondary | ICD-10-CM

## 2023-07-23 DIAGNOSIS — M5416 Radiculopathy, lumbar region: Secondary | ICD-10-CM

## 2023-07-23 DIAGNOSIS — R911 Solitary pulmonary nodule: Secondary | ICD-10-CM

## 2023-07-23 MED ORDER — METHYLPREDNISOLONE ACETATE 80 MG/ML IJ SUSP: 80.0000 mg | Freq: Once | INTRAMUSCULAR | Status: DC

## 2023-07-23 NOTE — Progress Notes (Signed)
Functional Pain Scale - descriptive words and definitions  Unmanageable (7)  Pain interferes with normal ADL's/nothing seems to help/sleep is very difficult/active distractions are very difficult to concentrate on. Severe range order  Average Pain 9-10   +Driver, -BT, -Dye Allergies.  Lower back pain on both sides. Hard to walk and stand

## 2023-07-27 MED ORDER — TRIAMCINOLONE ACETONIDE 40 MG/ML IJ SUSP
40.0000 mg | INTRAMUSCULAR | Status: AC | PRN
  Administered 2023-07-23: 40 mg via INTRA_ARTICULAR

## 2023-07-27 MED ORDER — BUPIVACAINE HCL 0.25 % IJ SOLN
4.0000 mL | INTRAMUSCULAR | Status: AC | PRN
  Administered 2023-07-23: 4 mL via INTRA_ARTICULAR

## 2023-07-27 NOTE — Progress Notes (Signed)
Sheri Rivera - 62 y.o. female MRN [id number]  Date of birth: Apr 21, 1961  Office Visit Note: Visit Date: 07/23/2023 PCP: Dois Davenport, MD Referred by: Dois Davenport, MD  Subjective: Chief Complaint  Patient presents with   Lower Back - Pain   HPI:  Sheri Rivera is a 62 y.o. female who comes in today for planned repeat Bilateral L4-5  Lumbar Transforaminal epidural steroid injection with fluoroscopic guidance.  The patient has failed conservative care including home exercise, medications, time and activity modification.  Prior injection was in May and she said it did help to some degree but her pain is just getting worse and worse.  She recently saw Dr. Willia Craze in our office for evaluation and management.  He felt like the biggest complaint she was having was her right hip but not really her spine.  He encouraged getting the spine injection but felt like the hip was really what was causing most of her issues at this point.  She does have lumbar stenosis moderate at L4-5 on MRI from last year.  Pain today is very consistent with hip pathology she gets a lot of pain going from sit to stand bending forward and with rotation.  She gets pain into the groin.  She is ambulating with a cane with an antalgic gait.  I have elected today to complete diagnostic and hopefully therapeutic intra-articular hip injection with fluoroscopic guidance on the right.  We can always look at epidural injection if needed.  She is following up with Dr. Prince Solian.   Referring: Dr. Annell Greening   ROS Otherwise per HPI.  Assessment & Plan: Visit Diagnoses:    ICD-10-CM   1. Lumbar radiculopathy  M54.16 XR C-ARM NO REPORT    Epidural Steroid injection    methylPREDNISolone acetate (DEPO-MEDROL) injection 80 mg      Plan: No additional findings.   Meds & Orders:  Meds ordered this encounter  Medications   methylPREDNISolone acetate (DEPO-MEDROL) injection 80 mg    Orders Placed This  Encounter  Procedures   Large Joint Inj   XR C-ARM NO REPORT   Epidural Steroid injection    Follow-up: Return for Annell Greening, MD.   Procedures: Large Joint Inj: R hip joint on 07/23/2023 2:00 PM Indications: diagnostic evaluation and pain Details: 22 G 3.5 in needle, fluoroscopy-guided anterior approach  Arthrogram: No  Medications: 4 mL bupivacaine 0.25 %; 40 mg triamcinolone acetonide 40 MG/ML Outcome: tolerated well, no immediate complications  There was excellent flow of contrast producing a partial arthrogram of the hip. The patient did have some relief of symptoms during the anesthetic phase of the injection. She was ambulating better. Procedure, treatment alternatives, risks and benefits explained, specific risks discussed. Consent was given by the patient. Immediately prior to procedure a time out was called to verify the correct patient, procedure, equipment, support staff and site/side marked as required. Patient was prepped and draped in the usual sterile fashion.          Clinical History: MRI LUMBAR SPINE WITHOUT CONTRAST   TECHNIQUE: Multiplanar, multisequence MR imaging of the lumbar spine was performed. No intravenous contrast was administered.   COMPARISON:  Lumbar MRI 04/15/2018   FINDINGS: Segmentation:  5 lumbar vertebra   Alignment:  Normal   Vertebrae: Negative for acute fracture or mass. Chronic fracture of L3 unchanged from the prior study   Conus medullaris and cauda equina: Conus extends to the L2 level. Conus and cauda  equina appear normal.   Paraspinal and other soft tissues: Negative for paraspinous mass or adenopathy   Disc levels:   L1-2: Negative   L2-3: Mild disc degeneration with disc space narrowing and mild spurring. Negative for stenosis   L3-4: Mild disc bulging and mild facet degeneration. Mild narrowing of the central canal without significant stenosis   L4-5: Disc degeneration with disc space narrowing and disc  bulging. Left foraminal disc protrusion similar to the prior study. Moderate left foraminal narrowing. Bilateral facet degeneration. Moderate central canal stenosis with mild progression from the prior study.   L5-S1: Moderate disc degeneration with disc space narrowing and endplate spurring. Moderate right foraminal narrowing due to disc and osteophyte. No change from the prior study.   IMPRESSION: Mild degenerative change L2-3 and L3-4 without significant stenosis   Left foraminal disc protrusion L4-5 with moderate left foraminal narrowing unchanged from the prior study. Moderate central canal stenosis with mild progression from 2019   Right foraminal encroachment L5-S1 due to disc and osteophyte complex, unchanged     Electronically Signed   By: Marlan Palau M.D.   On: 04/24/2022 10:58     Objective:  VS:  HT:    WT:   BMI:     BP:117/77  HR:88bpm  TEMP: ( )  RESP:  Physical Exam   Imaging: No results found.

## 2023-08-02 ENCOUNTER — Other Ambulatory Visit (INDEPENDENT_AMBULATORY_CARE_PROVIDER_SITE_OTHER): Payer: 59

## 2023-08-02 ENCOUNTER — Ambulatory Visit (INDEPENDENT_AMBULATORY_CARE_PROVIDER_SITE_OTHER): Payer: 59 | Admitting: Orthopaedic Surgery

## 2023-08-02 VITALS — BP 107/75 | HR 95 | Ht 62.0 in | Wt 174.0 lb

## 2023-08-02 DIAGNOSIS — M1611 Unilateral primary osteoarthritis, right hip: Secondary | ICD-10-CM | POA: Diagnosis not present

## 2023-08-02 DIAGNOSIS — M25551 Pain in right hip: Secondary | ICD-10-CM

## 2023-08-02 NOTE — Progress Notes (Unsigned)
Office Visit Note   Patient: Sheri Rivera           Date of Birth: 1961/09/19           MRN: [id number] Visit Date: 08/02/2023              Requested by: Dois Davenport, MD [address] [place],  [place] [address] PCP: Dois Davenport, MD   Assessment & Plan: Visit Diagnoses:  1. Pain in right hip   2. Unilateral primary osteoarthritis, right hip     Plan: We discussed total hip arthroplasty will have to wait a couple months due to recent intra-articular cortisone injection.  We discussed this in detail.  We discussed spinal anesthesia Exparel and Marcaine use of a walker postop.  Risk of femur fracture, infection revision discussed.  Questions were elicited and answered we reviewed her x-rays.  Follow-Up Instructions: No follow-ups on file.   Orders:  Orders Placed This Encounter  Procedures   XR HIP UNILAT W OR W/O PELVIS 2-3 VIEWS RIGHT   No orders of the defined types were placed in this encounter.     Procedures: No procedures performed   Clinical Data: No additional findings.   Subjective: Chief Complaint  Patient presents with   Lower Back - Follow-up    HPI 61 year old female returns with continued problems with pain in her leg difficulty getting out of stairs.  She has had injections in her back and at the time of exam was felt that principal portion of her problem was coming from her right hip osteoarthritis.  She is currently on hydrocodone 7.5/325 120 tablets monthly.  She is continue to have a Trendelenburg gait limping on the right hip difficulty getting from sitting to standing turning and sleeping.  Patient had an intra-articular injection with Dr. Alvester Morin on 07/23/2023 into her hip which gave her fantastic relief unfortunately short-lived and after a week or 2 her pain is recurred.  She has bone-on-bone changes on her hip x-rays.  Review of Systems positive history for depression GERD history of goiter.  New right hip osteoarthritis  positive Bacot dependence.  Previous lumbar epidurals in March and May. No associated bowel or bladder symptoms no chills or fever.  Objective: Vital Signs: BP 107/75   Pulse 95   Ht 5\' 2"  (1.575 m)   Wt 174 lb (78.9 kg)   BMI 31.83 kg/m   Physical Exam Constitutional:      Appearance: She is well-developed.  HENT:     Head: Normocephalic.     Right Ear: External ear normal.     Left Ear: External ear normal. There is no impacted cerumen.  Eyes:     Pupils: Pupils are equal, round, and reactive to light.  Neck:     Thyroid: No thyromegaly.     Trachea: No tracheal deviation.  Cardiovascular:     Rate and Rhythm: Normal rate.  Pulmonary:     Effort: Pulmonary effort is normal.  Abdominal:     Palpations: Abdomen is soft.  Musculoskeletal:     Cervical back: No rigidity.  Skin:    General: Skin is warm and dry.  Neurological:     Mental Status: She is alert and oriented to person, place, and time.  Psychiatric:        Behavior: Behavior normal.     Ortho Exam patient has sharp pain with internal rotation right hip reproduces her pain.  No hip flexion contracture opposite  left hip has good range of motion without pain.  Mild sciatic notch tenderness mild tenderness lumbar spine.  Positive Trendelenburg gait.  Specialty Comments:  MRI LUMBAR SPINE WITHOUT CONTRAST   TECHNIQUE: Multiplanar, multisequence MR imaging of the lumbar spine was performed. No intravenous contrast was administered.   COMPARISON:  Lumbar MRI 04/15/2018   FINDINGS: Segmentation:  5 lumbar vertebra   Alignment:  Normal   Vertebrae: Negative for acute fracture or mass. Chronic fracture of L3 unchanged from the prior study   Conus medullaris and cauda equina: Conus extends to the L2 level. Conus and cauda equina appear normal.   Paraspinal and other soft tissues: Negative for paraspinous mass or adenopathy   Disc levels:   L1-2: Negative   L2-3: Mild disc degeneration with disc  space narrowing and mild spurring. Negative for stenosis   L3-4: Mild disc bulging and mild facet degeneration. Mild narrowing of the central canal without significant stenosis   L4-5: Disc degeneration with disc space narrowing and disc bulging. Left foraminal disc protrusion similar to the prior study. Moderate left foraminal narrowing. Bilateral facet degeneration. Moderate central canal stenosis with mild progression from the prior study.   L5-S1: Moderate disc degeneration with disc space narrowing and endplate spurring. Moderate right foraminal narrowing due to disc and osteophyte. No change from the prior study.   IMPRESSION: Mild degenerative change L2-3 and L3-4 without significant stenosis   Left foraminal disc protrusion L4-5 with moderate left foraminal narrowing unchanged from the prior study. Moderate central canal stenosis with mild progression from 2019   Right foraminal encroachment L5-S1 due to disc and osteophyte complex, unchanged     Electronically Signed   By: Marlan Palau M.D.   On: 04/24/2022 10:58  Imaging: No results found.   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   Acute respiratory failure with hypoxia (HCC) 08/03/2017   GERD (gastroesophageal reflux disease) 06/13/2016   Chronic obstructive pulmonary disease (HCC) 06/13/2016   Current tobacco use 09/05/2015   History of fracture of vertebra 02/16/2015   Diffuse pain 04/03/2014   Hemorrhage, postmenopausal 12/29/2013   Chronic obstructive pulmonary disease with acute exacerbation (HCC) 10/30/2013   COPD mixed type (HCC) 03/30/2013   Abdominal pain, epigastric 02/25/2013   Dysphagia,  unspecified(787.20) 02/25/2013   Gas 02/25/2013   Abdominal bloating 02/25/2013   Precordial pain 02/16/2013   Multinodular goiter 10/05/2012   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   Bladder cystocele 07/21/2012   Clinical depression 11/11/2011   Allergic rhinitis 11/11/2011   URI (upper respiratory infection) 09/28/2011   Sore throat 07/18/2011   Neck swelling 06/22/2011   Ingrown right big toenail 05/23/2011   Chronic back pain 03/16/2011   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/05/2010   Shortness of breath 03/15/2010   GOITER, MULTINODULAR 07/01/2009   FIBROMYALGIA 07/01/2009   ALLERGIC RHINITIS CAUSE UNSPECIFIED 03/02/2008   Anxiety 01/23/2007   TOBACCO DEPENDENCE 01/23/2007   GASTROESOPHAGEAL REFLUX, NO ESOPHAGITIS 01/23/2007   Past Medical History:  Diagnosis Date   Anxiety 11/11/2011   Arthritis    Asthma    Bursitis    right shoulder   Chronic back pain    Chronic neck pain    Chronic shoulder pain  COPD (chronic obstructive pulmonary disease) (HCC)    continues to smoke 1ppd   DDD (degenerative disc disease)    Depression    Fibromyalgia    GERD (gastroesophageal reflux disease)    Heart murmur    as a child per pt   Herpes    History of radiation therapy 08/12/18- 10/03/18   Larynx, prescribed dose of 63 Gy in 28 fractions. She chose to stop treatments after 27 fractions.    IBS (irritable bowel syndrome)    Lumbar radiculopathy    Seasonal allergies    Smoker    Thyroid nodule    goiters    Family History  Problem Relation Age of Onset   Lung cancer Mother    Arthritis Mother        Rheumatoid   Bursitis Mother    Asthma Mother    Heart attack Father 57       Died of MI   Obesity Father    Fibromyalgia Sister    Asthma Child     Past Surgical History:  Procedure Laterality Date   CHOLECYSTECTOMY     DIRECT LARYNGOSCOPY N/A 07/11/2018   Procedure: DIRECT LARYNGOSCOPY WITH BIOPSY;  Surgeon: Drema Halon, MD;  Location: [facility];  Service: ENT;  Laterality: N/A;   HERNIA REPAIR     Hiatal    HIATAL HERNIA REPAIR     TUBAL LIGATION  1983   Social History   Occupational History    Employer: [employer]  Tobacco Use   Smoking status: Every Day    Current packs/day: 1.00    Average packs/day: 1 pack/day for 44.7 years (44.7 ttl pk-yrs)    Types: Cigarettes    Start date: 1980   Smokeless tobacco: Never   Tobacco comments:    She had quit until COVID-19, She is smoking 4-5 cigarettes daily, 07/24/19  Vaping Use   Vaping status: Never Used  Substance and Sexual Activity   Alcohol use: No   Drug use: No   Sexual activity: Not on file

## 2023-08-07 ENCOUNTER — Telehealth: Payer: Self-pay | Admitting: Orthopaedic Surgery

## 2023-08-07 ENCOUNTER — Other Ambulatory Visit: Payer: Self-pay | Admitting: Orthopaedic Surgery

## 2023-08-07 NOTE — Telephone Encounter (Signed)
Pt called requesting a call back from Dr Ophelia Charter or his assistant . Pt states she has to wait for surgery due to having injection and has to wait. Pt states she is in sever pains and need some medical advice on what to do about her pains until she can have surgery. Please call pt at  [phone].

## 2023-08-09 ENCOUNTER — Encounter: Payer: 59 | Attending: Physical Medicine and Rehabilitation | Admitting: Physical Medicine and Rehabilitation

## 2023-08-20 ENCOUNTER — Other Ambulatory Visit: Payer: Self-pay

## 2023-08-20 MED ORDER — HYDROCODONE-ACETAMINOPHEN 7.5-325 MG PO TABS: 1.0000 | ORAL_TABLET | Freq: Four times a day (QID) | ORAL | 0 refills | Status: DC | PRN

## 2023-08-20 NOTE — Telephone Encounter (Signed)
Pt called for refill Hydrocodone. Pt missed 9/13 appt and asked the reason for the no show she states she thought it was October. She rescheduled appt

## 2023-08-21 ENCOUNTER — Encounter: Payer: 59 | Admitting: Physical Medicine and Rehabilitation

## 2023-08-21 ENCOUNTER — Telehealth: Payer: Self-pay | Admitting: *Deleted

## 2023-08-21 MED ORDER — HYDROCODONE-ACETAMINOPHEN 7.5-325 MG PO TABS: 1.0000 | ORAL_TABLET | Freq: Four times a day (QID) | ORAL | 0 refills | Status: DC | PRN

## 2023-08-21 NOTE — Telephone Encounter (Signed)
Mrs Sheri Rivera called and beause she missed her appt earlier in the month they gave her one today but her daughter cannot get off work with that much short notice to bring her to the appt. She is unable to get herself here because of the need for hip replacement (she is unable to get it done for 2 months because of her cortisone injections- see note from [place]). You have nothing until 11/01/23 which she has taken that appt. She only has 3 pills and needs a refill today.

## 2023-08-21 NOTE — Telephone Encounter (Signed)
Notified.

## 2023-09-04 ENCOUNTER — Encounter: Payer: Self-pay | Admitting: Orthopaedic Surgery

## 2023-09-04 ENCOUNTER — Other Ambulatory Visit: Payer: Self-pay

## 2023-09-04 ENCOUNTER — Ambulatory Visit: Payer: 59 | Admitting: Orthopaedic Surgery

## 2023-09-04 VITALS — BP 114/75 | HR 98 | Ht 62.0 in | Wt 174.0 lb

## 2023-09-04 DIAGNOSIS — M1611 Unilateral primary osteoarthritis, right hip: Secondary | ICD-10-CM

## 2023-09-04 DIAGNOSIS — M5416 Radiculopathy, lumbar region: Secondary | ICD-10-CM | POA: Diagnosis not present

## 2023-09-04 MED ORDER — PREDNISONE 10 MG (21) PO TBPK: ORAL_TABLET | ORAL | 0 refills | Status: DC

## 2023-09-05 NOTE — Progress Notes (Signed)
Office Visit Note   Patient: Sheri Rivera           Date of Birth: [birth year]           MRN: [id number] Visit Date: 09/04/2023              Requested by: Dois Davenport, MD [id number] [place],  [place] [address] PCP: Dois Davenport, MD   Assessment & Plan: Visit Diagnoses:  1. Lumbar radiculopathy   2. Unilateral primary osteoarthritis, right hip     Plan: Patient is already on hydrocodone 7.5/3 2520 tablets monthly.  She is on ibuprofen full dose.  Will see her back in 2 weeks and we can discuss scheduling of right total hip arthroplasty.  Discussed with her she may require reimaging of the lumbar spine.  Her abnormal hip gait with severe hip osteoarthritis bone-on-bone changes right hip may have aggravated her lumbar spine symptoms.  Previous x-rays and MRI scan was reviewed as well as the report.  Follow-Up Instructions: Return in about 2 weeks (around 09/18/2023).   Orders:  Orders Placed This Encounter  Procedures   XR Lumbar Spine 2-3 Views   Meds ordered this encounter  Medications   predniSONE (STERAPRED UNI-PAK 21 TAB) 10 MG (21) TBPK tablet    Sig: Take 6,5,4,3,2,1 one tablet less each day, take with food.    Dispense:  21 tablet    Refill:  0      Procedures: No procedures performed   Clinical Data: No additional findings.   Subjective: Chief Complaint  Patient presents with   Right Hip - Pain    HPI 62 year old female returns with continued right severe hip pain she is using a rolling walker has to lean on her elbows cannot stand up straight states she is having back pain and previously MRI scan showed some moderate stenosis.  She has more pain in the right thigh than left also pain lumbosacral junction does not really radiate to her foot.  States she cannot stand more than 8 minutes but she is not sure if it is related to her hip pain or back.  Previous right hip injection gave her great relief for 7 days.  Hip injection was done  07/23/2023 by Dr. Alvester Morin.  Review of Systems reviewed updated unchanged.   Objective: Vital Signs: BP 114/75   Pulse 98   Ht 5\' 2"  (1.575 m)   Wt 174 lb (78.9 kg)   BMI 31.83 kg/m   Physical Exam Constitutional:      Appearance: She is well-developed.  HENT:     Head: Normocephalic.     Right Ear: External ear normal.     Left Ear: External ear normal. There is no impacted cerumen.  Eyes:     Pupils: Pupils are equal, round, and reactive to light.  Neck:     Thyroid: No thyromegaly.     Trachea: No tracheal deviation.  Cardiovascular:     Rate and Rhythm: Normal rate.  Pulmonary:     Effort: Pulmonary effort is normal.  Abdominal:     Palpations: Abdomen is soft.  Musculoskeletal:     Cervical back: No rigidity.  Skin:    General: Skin is warm and dry.  Neurological:     Mental Status: She is alert and oriented to person, place, and time.  Psychiatric:        Behavior: Behavior normal.     Ortho Exam patient with severe pain can  extend the right hip.  Severe pain with 5 to 10 degrees internal rotation causes her to call out in pain.  Distal pulses intact anterior tib gastrocsoleus ankle dorsiflexion plantarflexion is intact.  4:00 yesterday  Specialty Comments:  MRI LUMBAR SPINE WITHOUT CONTRAST   TECHNIQUE: Multiplanar, multisequence MR imaging of the lumbar spine was performed. No intravenous contrast was administered.   COMPARISON:  Lumbar MRI 04/15/2018   FINDINGS: Segmentation:  5 lumbar vertebra   Alignment:  Normal   Vertebrae: Negative for acute fracture or mass. Chronic fracture of L3 unchanged from the prior study   Conus medullaris and cauda equina: Conus extends to the L2 level. Conus and cauda equina appear normal.   Paraspinal and other soft tissues: Negative for paraspinous mass or adenopathy   Disc levels:   L1-2: Negative   L2-3: Mild disc degeneration with disc space narrowing and mild spurring. Negative for stenosis   L3-4:  Mild disc bulging and mild facet degeneration. Mild narrowing of the central canal without significant stenosis   L4-5: Disc degeneration with disc space narrowing and disc bulging. Left foraminal disc protrusion similar to the prior study. Moderate left foraminal narrowing. Bilateral facet degeneration. Moderate central canal stenosis with mild progression from the prior study.   L5-S1: Moderate disc degeneration with disc space narrowing and endplate spurring. Moderate right foraminal narrowing due to disc and osteophyte. No change from the prior study.   IMPRESSION: Mild degenerative change L2-3 and L3-4 without significant stenosis   Left foraminal disc protrusion L4-5 with moderate left foraminal narrowing unchanged from the prior study. Moderate central canal stenosis with mild progression from 2019   Right foraminal encroachment L5-S1 due to disc and osteophyte complex, unchanged     Electronically Signed   By: Marlan Palau M.D.   On: 04/24/2022 10:58  Imaging: No results found.   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   Acute respiratory failure with hypoxia (HCC) 08/03/2017   GERD (gastroesophageal reflux disease) 06/13/2016   Chronic obstructive pulmonary disease (HCC) 06/13/2016   Current tobacco use 09/05/2015   History of fracture of vertebra 02/16/2015   Diffuse pain 04/03/2014   Hemorrhage, postmenopausal 12/29/2013   Chronic obstructive pulmonary disease with acute exacerbation (HCC) 10/30/2013   COPD mixed type (HCC) 03/30/2013   Abdominal pain, epigastric 02/25/2013   Dysphagia 02/25/2013   Gas 02/25/2013   Abdominal bloating 02/25/2013    Precordial pain 02/16/2013   Multinodular goiter 10/05/2012   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   Bladder cystocele 07/21/2012   Clinical depression 11/11/2011   Allergic rhinitis 11/11/2011   URI (upper respiratory infection) 09/28/2011   Sore throat 07/18/2011   Neck swelling 06/22/2011   Ingrown right big toenail 05/23/2011   Chronic back pain 03/16/2011   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/05/2010   Shortness of breath 03/15/2010   GOITER, MULTINODULAR 07/01/2009   FIBROMYALGIA 07/01/2009   ALLERGIC RHINITIS CAUSE UNSPECIFIED 03/02/2008   Anxiety 01/23/2007   TOBACCO DEPENDENCE 01/23/2007   GASTROESOPHAGEAL REFLUX, NO ESOPHAGITIS 01/23/2007   Past Medical History:  Diagnosis Date   Anxiety 11/11/2011   Arthritis    Asthma    Bursitis    right shoulder   Chronic back  pain    Chronic neck pain    Chronic shoulder pain    COPD (chronic obstructive pulmonary disease) (HCC)    continues to smoke 1ppd   DDD (degenerative disc disease)    Depression    Fibromyalgia    GERD (gastroesophageal reflux disease)    Heart murmur    as a child per pt   Herpes    History of radiation therapy 08/12/18- 10/03/18   Larynx, prescribed dose of 63 Gy in 28 fractions. She chose to stop treatments after 27 fractions.    IBS (irritable bowel syndrome)    Lumbar radiculopathy    Seasonal allergies    Smoker    Thyroid nodule    goiters    Family History  Problem Relation Age of Onset   Lung cancer Mother    Arthritis Mother        Rheumatoid   Bursitis Mother    Asthma Mother    Heart attack Father 9       Died of MI   Obesity Father    Fibromyalgia Sister    Asthma Child     Past Surgical History:  Procedure Laterality Date   CHOLECYSTECTOMY     DIRECT LARYNGOSCOPY N/A 07/11/2018   Procedure: DIRECT LARYNGOSCOPY WITH BIOPSY;  Surgeon: Drema Halon, MD;  Location: [facility];  Service: ENT;  Laterality: N/A;   HERNIA REPAIR     Hiatal    HIATAL  HERNIA REPAIR     TUBAL LIGATION  1983   Social History   Occupational History    Employer: [employer]  Tobacco Use   Smoking status: Every Day    Current packs/day: 1.00    Average packs/day: 1 pack/day for 44.8 years (44.8 ttl pk-yrs)    Types: Cigarettes    Start date: 1980   Smokeless tobacco: Never   Tobacco comments:    She had quit until COVID-19, She is smoking 4-5 cigarettes daily, 07/24/19  Vaping Use   Vaping status: Never Used  Substance and Sexual Activity   Alcohol use: No   Drug use: No   Sexual activity: Not on file

## 2023-09-09 ENCOUNTER — Encounter: Payer: Self-pay | Admitting: Registered Nurse

## 2023-09-09 ENCOUNTER — Encounter: Payer: 59 | Attending: Physical Medicine and Rehabilitation | Admitting: Registered Nurse

## 2023-09-09 VITALS — BP 111/77 | HR 65 | Ht 62.0 in | Wt 181.2 lb

## 2023-09-09 DIAGNOSIS — G894 Chronic pain syndrome: Secondary | ICD-10-CM | POA: Insufficient documentation

## 2023-09-09 DIAGNOSIS — Z79891 Long term (current) use of opiate analgesic: Secondary | ICD-10-CM | POA: Diagnosis present

## 2023-09-09 DIAGNOSIS — Z5181 Encounter for therapeutic drug level monitoring: Secondary | ICD-10-CM | POA: Diagnosis present

## 2023-09-09 DIAGNOSIS — M48062 Spinal stenosis, lumbar region with neurogenic claudication: Secondary | ICD-10-CM | POA: Insufficient documentation

## 2023-09-09 DIAGNOSIS — G8929 Other chronic pain: Secondary | ICD-10-CM | POA: Diagnosis present

## 2023-09-09 DIAGNOSIS — M797 Fibromyalgia: Secondary | ICD-10-CM | POA: Insufficient documentation

## 2023-09-09 DIAGNOSIS — M25551 Pain in right hip: Secondary | ICD-10-CM | POA: Insufficient documentation

## 2023-09-09 NOTE — Progress Notes (Unsigned)
Subjective:    Patient ID: Sheri Rivera, female    DOB: November 29, 1960, 62 y.o.   MRN: [id number]  HPI: Sheri Rivera is a 62 y.o. female who returns for follow up appointment for chronic pain and medication refill. states *** pain is located in  ***. rates pain ***. current exercise regime is walking and performing stretching exercises.   Ms. Goodspeed Morphine equivalent is 30.00 MME.   UDS ordered today     Pain Inventory Average Pain 10 Pain Right Now 8 My pain is constant and sharp  In the last 24 hours, has pain interfered with the following? General activity 1 Relation with others 0 Enjoyment of life 0 What TIME of day is your pain at its worst? morning , daytime, evening, and night Sleep (in general) Poor  Pain is worse with: walking, bending, sitting, inactivity, standing, and some activites Pain improves with:  nothing Relief from Meds: 6  Family History  Problem Relation Age of Onset   Lung cancer Mother    Arthritis Mother        Rheumatoid   Bursitis Mother    Asthma Mother    Heart attack Father 38       Died of MI   Obesity Father    Fibromyalgia Sister    Asthma Child    Social History   Socioeconomic History   Marital status: Divorced    Spouse name: Not on file   Number of children: 3   Years of education: Not on file   Highest education level: Not on file  Occupational History    Employer: [employer]  Tobacco Use   Smoking status: Every Day    Current packs/day: 1.00    Average packs/day: 1 pack/day for 44.8 years (44.8 ttl pk-yrs)    Types: Cigarettes    Start date: 1980   Smokeless tobacco: Never   Tobacco comments:    She had quit until COVID-19, She is smoking 4-5 cigarettes daily, 07/24/19  Vaping Use   Vaping status: Never Used  Substance and Sexual Activity   Alcohol use: No   Drug use: No   Sexual activity: Not on file  Other Topics Concern   Not on file  Social History Narrative   She lives by herself   Social  Determinants of Health   Financial Resource Strain: Not on file  Food Insecurity: Not on file  Transportation Needs: Unmet Transportation Needs (01/07/2019)   PRAPARE - Administrator, Civil Service (Medical): Yes    Lack of Transportation (Non-Medical): Not on file  Physical Activity: Not on file  Stress: Not on file  Social Connections: Unknown (05/16/2022)   [facility]   Social Network    Social Network: Not on file   Past Surgical History:  Procedure Laterality Date   CHOLECYSTECTOMY     DIRECT LARYNGOSCOPY N/A 07/11/2018   Procedure: DIRECT LARYNGOSCOPY WITH BIOPSY;  Surgeon: Drema Halon, MD;  Location: [facility];  Service: ENT;  Laterality: N/A;   HERNIA REPAIR     Hiatal    HIATAL HERNIA REPAIR     TUBAL LIGATION  1983   Past Surgical History:  Procedure Laterality Date   CHOLECYSTECTOMY     DIRECT LARYNGOSCOPY N/A 07/11/2018   Procedure: DIRECT LARYNGOSCOPY WITH BIOPSY;  Surgeon: Drema Halon, MD;  Location: [facility];  Service: ENT;  Laterality: N/A;   HERNIA REPAIR  Hiatal    HIATAL HERNIA REPAIR     TUBAL LIGATION  1983   Past Medical History:  Diagnosis Date   Anxiety 11/11/2011   Arthritis    Asthma    Bursitis    right shoulder   Chronic back pain    Chronic neck pain    Chronic shoulder pain    COPD (chronic obstructive pulmonary disease) (HCC)    continues to smoke 1ppd   DDD (degenerative disc disease)    Depression    Fibromyalgia    GERD (gastroesophageal reflux disease)    Heart murmur    as a child per pt   Herpes    History of radiation therapy 08/12/18- 10/03/18   Larynx, prescribed dose of 63 Gy in 28 fractions. She chose to stop treatments after 27 fractions.    IBS (irritable bowel syndrome)    Lumbar radiculopathy    Seasonal allergies    Smoker    Thyroid nodule    goiters   BP 111/77   Pulse 65   Ht 5\' 2"  (1.575 m)   Wt 181 lb 3.2 oz  (82.2 kg)   SpO2 95%   BMI 33.14 kg/m   Opioid Risk Score:   Fall Risk Score:  `1  Depression screen [facility] 2/9     09/09/2023    2:41 PM 12/12/2022   11:24 AM 01/19/2022    3:12 PM 09/01/2021    1:26 PM  Depression screen PHQ 2/9  Decreased Interest 1 0 1 3  Down, Depressed, Hopeless 1 0 0 2  PHQ - 2 Score 2 0 1 5  Altered sleeping    1  Tired, decreased energy    2  Change in appetite    0  Feeling bad or failure about yourself     0  Trouble concentrating    0  Moving slowly or fidgety/restless    3  Suicidal thoughts    0  PHQ-9 Score    11  Difficult doing work/chores    Extremely dIfficult     Review of Systems  Constitutional: Negative.   HENT: Negative.    Eyes: Negative.   Respiratory: Negative.    Cardiovascular: Negative.   Gastrointestinal: Negative.   Endocrine: Negative.   Genitourinary: Negative.   Musculoskeletal:  Positive for back pain and gait problem.       Waiting on hip replacement---painful hip  Skin: Negative.   Allergic/Immunologic: Negative.   Hematological: Negative.   Psychiatric/Behavioral:  Positive for dysphoric mood.   All other systems reviewed and are negative.      Objective:   Physical Exam        Assessment & Plan:

## 2023-09-12 ENCOUNTER — Telehealth: Payer: Self-pay | Admitting: Registered Nurse

## 2023-09-12 LAB — TOXASSURE SELECT,+ANTIDEPR,UR

## 2023-09-12 MED ORDER — HYDROCODONE-ACETAMINOPHEN 7.5-325 MG PO TABS: 1.0000 | ORAL_TABLET | Freq: Four times a day (QID) | ORAL | 0 refills | Status: DC | PRN

## 2023-09-12 NOTE — Telephone Encounter (Signed)
PMP was Reviewed. UDS was Reviewed. Hydrocodone e-scribed to the pharmacy [place].

## 2023-09-17 ENCOUNTER — Ambulatory Visit (INDEPENDENT_AMBULATORY_CARE_PROVIDER_SITE_OTHER): Payer: 59 | Admitting: Orthopaedic Surgery

## 2023-09-17 ENCOUNTER — Encounter: Payer: Self-pay | Admitting: Orthopaedic Surgery

## 2023-09-17 VITALS — BP 116/76 | HR 75 | Ht 62.0 in | Wt 183.0 lb

## 2023-09-17 DIAGNOSIS — M1611 Unilateral primary osteoarthritis, right hip: Secondary | ICD-10-CM | POA: Diagnosis not present

## 2023-09-18 ENCOUNTER — Other Ambulatory Visit: Payer: Self-pay | Admitting: Physician Assistant

## 2023-09-18 NOTE — Progress Notes (Signed)
Office Visit Note   Patient: Sheri Rivera           Date of Birth: Aug 18, 1961           MRN: [id number] Visit Date: 09/17/2023              Requested by: Dois Davenport, MD [address] [place],  [place] [address] PCP: Dois Davenport, MD   Assessment & Plan: Visit Diagnoses:  1. Unilateral primary osteoarthritis, right hip     Plan: Right hip x-rays show bone-on-bone changes with no joint space.  Opposite left hip shows preservation of the joint space.  Some flattening of the head on the right side with subchondral sclerosis and subchondral cyst formation with marginal osteophytes.  Plan right total hip arthroplasty direct anterior approach.  We again discussed postoperative pain being more difficult since she is already taking relatively moderate to high dose of narcotic medication.  I reviewed with her MRI of her lumbar spine which shows showed mild and at most only moderate foraminal narrowing.  She does certainly have significant arthritis in her right hip.  Discussed total hip arthroplasty direct anterior approach with spinal anesthesia with postoperative Exparel Marcaine normal overnight stay.  Questions were elicited and answered she understands request to proceed.  Risks associated with anesthesia discussed risks of fracture, dislocation component loosening, infection, need for revision, leg length discrepancy all discussed in detail.  Follow-Up Instructions: No follow-ups on file.   Orders:  No orders of the defined types were placed in this encounter.  No orders of the defined types were placed in this encounter.     Procedures: No procedures performed   Clinical Data: No additional findings.   Subjective: Chief Complaint  Patient presents with   Right Hip - Pain, Follow-up    HPI 62 year old female returns with severe right hip pain.  She got great temporary relief with right hip injection done with cortisone back in August.  After a week or  2 her pain came back she has bone-on-bone changes in her hip and states she is ready proceed with scheduling total of arthroplasty.  We have added on a walker with limited weightbearing due to her pain.  Additionally she has had problems with depression, goiter, chronic pain management With hydrocodone 7.5/325 120 tablets monthly.  We have discussed last visit and also again this visit in detail how chronic medication for pain makes postoperative pain management more difficult.  She has been treated with prednisone taper pack nonweightbearing use of rolling walker as well as a cane in the past. Review of Systems positive history of fibromyalgia, anxiety, chronic back pain.  Lumbar MRI 04/24/2022 showed mild degenerative changes L2-3 and L3-4.  Small left foraminal protrusion with moderate foraminal narrowing L4-5 unchanged from 2019 MRI.  No severe compression present on her lumbar MRI.   Objective: Vital Signs: BP 116/76   Pulse 75   Ht 5\' 2"  (1.575 m)   Wt 183 lb (83 kg)   BMI 33.47 kg/m   Physical Exam Constitutional:      Appearance: She is well-developed.  HENT:     Head: Normocephalic.     Right Ear: External ear normal.     Left Ear: External ear normal. There is no impacted cerumen.  Eyes:     Pupils: Pupils are equal, round, and reactive to light.  Neck:     Thyroid: No thyromegaly.     Trachea: No tracheal deviation.  Cardiovascular:     Rate and Rhythm: Normal rate.  Pulmonary:     Effort: Pulmonary effort is normal.  Abdominal:     Palpations: Abdomen is soft.  Musculoskeletal:     Cervical back: No rigidity.  Skin:    General: Skin is warm and dry.  Neurological:     Mental Status: She is alert and oriented to person, place, and time.  Psychiatric:        Behavior: Behavior normal.     Ortho Exam patient has severe pain with internal rotation of the right hip ambulates with a Trendelenburg gait.  No hip flexion contracture.  Pulses are normal.  Sensation of foot  is intact.  Specialty Comments:  MRI LUMBAR SPINE WITHOUT CONTRAST   TECHNIQUE: Multiplanar, multisequence MR imaging of the lumbar spine was performed. No intravenous contrast was administered.   COMPARISON:  Lumbar MRI 04/15/2018   FINDINGS: Segmentation:  5 lumbar vertebra   Alignment:  Normal   Vertebrae: Negative for acute fracture or mass. Chronic fracture of L3 unchanged from the prior study   Conus medullaris and cauda equina: Conus extends to the L2 level. Conus and cauda equina appear normal.   Paraspinal and other soft tissues: Negative for paraspinous mass or adenopathy   Disc levels:   L1-2: Negative   L2-3: Mild disc degeneration with disc space narrowing and mild spurring. Negative for stenosis   L3-4: Mild disc bulging and mild facet degeneration. Mild narrowing of the central canal without significant stenosis   L4-5: Disc degeneration with disc space narrowing and disc bulging. Left foraminal disc protrusion similar to the prior study. Moderate left foraminal narrowing. Bilateral facet degeneration. Moderate central canal stenosis with mild progression from the prior study.   L5-S1: Moderate disc degeneration with disc space narrowing and endplate spurring. Moderate right foraminal narrowing due to disc and osteophyte. No change from the prior study.   IMPRESSION: Mild degenerative change L2-3 and L3-4 without significant stenosis   Left foraminal disc protrusion L4-5 with moderate left foraminal narrowing unchanged from the prior study. Moderate central canal stenosis with mild progression from 2019   Right foraminal encroachment L5-S1 due to disc and osteophyte complex, unchanged     Electronically Signed   By: Marlan Palau M.D.   On: 04/24/2022 10:58  Imaging: No results found.   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   Acute respiratory failure with hypoxia (HCC) 08/03/2017   GERD (gastroesophageal reflux disease) 06/13/2016   Chronic obstructive pulmonary disease (HCC) 06/13/2016   Current tobacco use 09/05/2015   History of fracture of vertebra 02/16/2015   Diffuse pain 04/03/2014   Hemorrhage, postmenopausal 12/29/2013   Chronic obstructive pulmonary disease with acute exacerbation (HCC) 10/30/2013   COPD mixed type (HCC) 03/30/2013   Abdominal pain, epigastric 02/25/2013   Dysphagia 02/25/2013   Gas 02/25/2013   Abdominal bloating 02/25/2013   Precordial pain 02/16/2013   Multinodular goiter 10/05/2012   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   Bladder cystocele 07/21/2012   Clinical depression 11/11/2011   Allergic rhinitis 11/11/2011   URI (upper respiratory infection) 09/28/2011   Sore throat 07/18/2011   Neck swelling 06/22/2011   Ingrown right big  toenail 05/23/2011   Chronic back pain 03/16/2011   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/05/2010   Shortness of breath 03/15/2010   GOITER, MULTINODULAR 07/01/2009   FIBROMYALGIA 07/01/2009   ALLERGIC RHINITIS CAUSE UNSPECIFIED 03/02/2008   Anxiety 01/23/2007   TOBACCO DEPENDENCE 01/23/2007   GASTROESOPHAGEAL REFLUX, NO ESOPHAGITIS 01/23/2007   Past Medical History:  Diagnosis Date   Anxiety 11/11/2011   Arthritis    Asthma    Bursitis    right shoulder   Chronic back pain    Chronic neck pain    Chronic shoulder pain    COPD (chronic obstructive pulmonary disease) (HCC)    continues to smoke 1ppd   DDD (degenerative disc disease)    Depression    Fibromyalgia    GERD (gastroesophageal reflux disease)    Heart murmur    as a child per pt   Herpes    History of radiation therapy 08/12/18- 10/03/18   Larynx, prescribed dose of 63 Gy in  28 fractions. She chose to stop treatments after 27 fractions.    IBS (irritable bowel syndrome)    Lumbar radiculopathy    Seasonal allergies    Smoker    Thyroid nodule    goiters    Family History  Problem Relation Age of Onset   Lung cancer Mother    Arthritis Mother        Rheumatoid   Bursitis Mother    Asthma Mother    Heart attack Father 71       Died of MI   Obesity Father    Fibromyalgia Sister    Asthma Child     Past Surgical History:  Procedure Laterality Date   CHOLECYSTECTOMY     DIRECT LARYNGOSCOPY N/A 07/11/2018   Procedure: DIRECT LARYNGOSCOPY WITH BIOPSY;  Surgeon: Drema Halon, MD;  Location: [facility];  Service: ENT;  Laterality: N/A;   HERNIA REPAIR     Hiatal    HIATAL HERNIA REPAIR     TUBAL LIGATION  1983   Social History   Occupational History    Employer: [employer]  Tobacco Use   Smoking status: Every Day    Current packs/day: 1.00    Average packs/day: 1 pack/day for 44.8 years (44.8 ttl pk-yrs)    Types: Cigarettes    Start date: 1980   Smokeless tobacco: Never   Tobacco comments:    She had quit until COVID-19, She is smoking 4-5 cigarettes daily, 07/24/19  Vaping Use   Vaping status: Never Used  Substance and Sexual Activity   Alcohol use: No   Drug use: No   Sexual activity: Not on file

## 2023-09-20 ENCOUNTER — Telehealth: Payer: Self-pay

## 2023-09-20 NOTE — Telephone Encounter (Signed)
Patient called stating that her leg/hip is so much more painful and weak having trouble walking due to pain and she is "freaking out"; wanting to move surgery up if possible Her [phone]

## 2023-09-20 NOTE — Telephone Encounter (Signed)
I called and advised unable to move up surgery because no OR time and had to wait 2 months because of hip injection.  She will try to be careful in the meantime since she is having difficulty walking.

## 2023-09-26 NOTE — Progress Notes (Signed)
Surgical Instructions   Your procedure is scheduled on Wednesday, 10/02/23. Report to [facility] Main Entrance "A" at 10:30 A.M., then check in with the Admitting office. Any questions or running late day of surgery: call [phone]  Questions prior to your surgery date: call [phone], Monday-Friday, 8am-4pm. If you experience any cold or flu symptoms such as cough, fever, chills, shortness of breath, etc. between now and your scheduled surgery, please notify us at the above number.     Remember:  Do not eat after midnight the night before your surgery  You may drink clear liquids until 9:30am the morning of your surgery.   Clear liquids allowed are: Water, Non-Citrus Juices (without pulp), Carbonated Beverages, Clear Tea, Black Coffee Only (NO MILK, CREAM OR POWDERED CREAMER of any kind), and Gatorade.  Patient Instructions  The night before surgery:  No food after midnight. ONLY clear liquids after midnight  The day of surgery (if you do NOT have diabetes):  Drink ONE (1) Pre-Surgery Clear Ensure by 9:30am the morning of surgery. Drink in one sitting. Do not sip.  This drink was given to you during your hospital  pre-op appointment visit. Nothing else to drink after completing the  Pre-Surgery Clear Ensure.           If you have questions, please contact your surgeon's office.     Take these medicines the morning of surgery with A SIP OF WATER  fluticasone (FLONASE)  levothyroxine (SYNTHROID)  rosuvastatin (CRESTOR)  TRELEGY ELLIPTA   May take these medicines IF NEEDED: albuterol (ACCUNEB) nebulizer and inhaler- Please bring all inhalers with you the day of surgery.  HYDROcodone-acetaminophen (NORCO)  LORazepam (ATIVAN)   One week prior to surgery, STOP taking any Aspirin (unless otherwise instructed by your surgeon) Aleve, Naproxen, Ibuprofen, Motrin, Advil, Goody's, BC's, all herbal medications, fish oil, and non-prescription vitamins.                     Do  NOT Smoke (Tobacco/Vaping) for 24 hours prior to your procedure.  If you use a CPAP at night, you may bring your mask/headgear for your overnight stay.   You will be asked to remove any contacts, glasses, piercing's, hearing aid's, dentures/partials prior to surgery. Please bring cases for these items if needed.    Patients discharged the day of surgery will not be allowed to drive home, and someone needs to stay with them for 24 hours.  SURGICAL WAITING ROOM VISITATION Patients may have no more than 2 support people in the waiting area - these visitors may rotate.   Pre-op nurse will coordinate an appropriate time for 1 ADULT support person, who may not rotate, to accompany patient in pre-op.  Children under the age of 1 must have an adult with them who is not the patient and must remain in the main waiting area with an adult.  If the patient needs to stay at the hospital during part of their recovery, the visitor guidelines for inpatient rooms apply.  Please refer to the [facility] website for the visitor guidelines for any additional information.   If you received a COVID test during your pre-op visit  it is requested that you wear a mask when out in public, stay away from anyone that may not be feeling well and notify your surgeon if you develop symptoms. If you have been in contact with anyone that has tested positive in the last 10 days please notify you surgeon.  Pre-operative 5 CHG Bathing Instructions   You can play a key role in reducing the risk of infection after surgery. Your skin needs to be as free of germs as possible. You can reduce the number of germs on your skin by washing with CHG (chlorhexidine gluconate) soap before surgery. CHG is an antiseptic soap that kills germs and continues to kill germs even after washing.   DO NOT use if you have an allergy to chlorhexidine/CHG or antibacterial soaps. If your skin becomes reddened or irritated, stop using the CHG and  notify one of our RNs at [phone].   Please shower with the CHG soap starting 4 days before surgery using the following schedule:     Please keep in mind the following:  DO NOT shave, including legs and underarms, starting the day of your first shower.   You may shave your face at any point before/day of surgery.  Place clean sheets on your bed the day you start using CHG soap. Use a clean washcloth (not used since being washed) for each shower. DO NOT sleep with pets once you start using the CHG.   CHG Shower Instructions:  Wash your face and private area with normal soap. If you choose to wash your hair, wash first with your normal shampoo.  After you use shampoo/soap, rinse your hair and body thoroughly to remove shampoo/soap residue.  Turn the water OFF and apply about 3 tablespoons (45 ml) of CHG soap to a CLEAN washcloth.  Apply CHG soap ONLY FROM YOUR NECK DOWN TO YOUR TOES (washing for 3-5 minutes)  DO NOT use CHG soap on face, private areas, open wounds, or sores.  Pay special attention to the area where your surgery is being performed.  If you are having back surgery, having someone wash your back for you may be helpful. Wait 2 minutes after CHG soap is applied, then you may rinse off the CHG soap.  Pat dry with a clean towel  Put on clean clothes/pajamas   If you choose to wear lotion, please use ONLY the CHG-compatible lotions on the back of this paper.   Additional instructions for the day of surgery: DO NOT APPLY any lotions, deodorants, cologne, or perfumes.   Do not bring valuables to the hospital. [facility] is not responsible for any belongings/valuables. Do not wear nail polish, gel polish, artificial nails, or any other type of covering on natural nails (fingers and toes) Do not wear jewelry or makeup Put on clean/comfortable clothes.  Please brush your teeth.  Ask your nurse before applying any prescription medications to the skin.     CHG Compatible  Lotions   Aveeno Moisturizing lotion  Cetaphil Moisturizing Cream  Cetaphil Moisturizing Lotion  Clairol Herbal Essence Moisturizing Lotion, Dry Skin  Clairol Herbal Essence Moisturizing Lotion, Extra Dry Skin  Clairol Herbal Essence Moisturizing Lotion, Normal Skin  Curel Age Defying Therapeutic Moisturizing Lotion with Alpha Hydroxy  Curel Extreme Care Body Lotion  Curel Soothing Hands Moisturizing Hand Lotion  Curel Therapeutic Moisturizing Cream, Fragrance-Free  Curel Therapeutic Moisturizing Lotion, Fragrance-Free  Curel Therapeutic Moisturizing Lotion, Original Formula  Eucerin Daily Replenishing Lotion  Eucerin Dry Skin Therapy Plus Alpha Hydroxy Crme  Eucerin Dry Skin Therapy Plus Alpha Hydroxy Lotion  Eucerin Original Crme  Eucerin Original Lotion  Eucerin Plus Crme Eucerin Plus Lotion  Eucerin TriLipid Replenishing Lotion  Keri Anti-Bacterial Hand Lotion  Keri Deep Conditioning Original Lotion Dry Skin Formula Softly Scented  Keri Deep Conditioning Original Lotion,  Fragrance Free Sensitive Skin Formula  Keri Lotion Fast Absorbing Fragrance Free Sensitive Skin Formula  Keri Lotion Fast Absorbing Softly Scented Dry Skin Formula  Keri Original Lotion  Keri Skin Renewal Lotion Keri Silky Smooth Lotion  Keri Silky Smooth Sensitive Skin Lotion  Nivea Body Creamy Conditioning Oil  Nivea Body Extra Enriched Lotion  Nivea Body Original Lotion  Nivea Body Sheer Moisturizing Lotion Nivea Crme  Nivea Skin Firming Lotion  NutraDerm 30 Skin Lotion  NutraDerm Skin Lotion  NutraDerm Therapeutic Skin Cream  NutraDerm Therapeutic Skin Lotion  ProShield Protective Hand Cream  Provon moisturizing lotion  Please read over the following fact sheets that you were given.

## 2023-09-27 ENCOUNTER — Other Ambulatory Visit: Payer: Self-pay

## 2023-09-27 ENCOUNTER — Encounter (HOSPITAL_COMMUNITY): Payer: Self-pay

## 2023-09-27 ENCOUNTER — Encounter (HOSPITAL_COMMUNITY)
Admission: RE | Admit: 2023-09-27 | Discharge: 2023-09-27 | Disposition: A | Discharge status: Home / Self Care | Payer: 59 | Source: Ambulatory Visit | Attending: Orthopaedic Surgery | Admitting: Orthopaedic Surgery

## 2023-09-27 VITALS — BP 120/69 | HR 81 | Temp 98.1°F | Resp 18 | Ht 62.0 in | Wt 181.7 lb

## 2023-09-27 DIAGNOSIS — K449 Diaphragmatic hernia without obstruction or gangrene: Secondary | ICD-10-CM | POA: Diagnosis not present

## 2023-09-27 DIAGNOSIS — Z01818 Encounter for other preprocedural examination: Secondary | ICD-10-CM

## 2023-09-27 DIAGNOSIS — G8929 Other chronic pain: Secondary | ICD-10-CM | POA: Insufficient documentation

## 2023-09-27 DIAGNOSIS — K589 Irritable bowel syndrome without diarrhea: Secondary | ICD-10-CM | POA: Insufficient documentation

## 2023-09-27 DIAGNOSIS — M797 Fibromyalgia: Secondary | ICD-10-CM | POA: Diagnosis not present

## 2023-09-27 DIAGNOSIS — D098 Carcinoma in situ of other specified sites: Secondary | ICD-10-CM | POA: Diagnosis not present

## 2023-09-27 DIAGNOSIS — I700 Atherosclerosis of aorta: Secondary | ICD-10-CM

## 2023-09-27 DIAGNOSIS — K219 Gastro-esophageal reflux disease without esophagitis: Secondary | ICD-10-CM | POA: Insufficient documentation

## 2023-09-27 DIAGNOSIS — Z87891 Personal history of nicotine dependence: Secondary | ICD-10-CM | POA: Insufficient documentation

## 2023-09-27 DIAGNOSIS — J45909 Unspecified asthma, uncomplicated: Secondary | ICD-10-CM | POA: Diagnosis not present

## 2023-09-27 HISTORY — DX: Personal history of malignant neoplasm of larynx: Z85.21

## 2023-09-27 HISTORY — DX: Personal history of other diseases of the digestive system: Z87.19

## 2023-09-27 HISTORY — DX: Hypothyroidism, unspecified: E03.9

## 2023-09-27 HISTORY — DX: Atherosclerosis of aorta: I70.0

## 2023-09-27 LAB — TYPE AND SCREEN
ABO/RH(D): O POS
Antibody Screen: NEGATIVE

## 2023-09-27 LAB — CBC
HCT: 46.9 % — ABNORMAL HIGH (ref 36.0–46.0)
Hemoglobin: 15.7 g/dL — ABNORMAL HIGH (ref 12.0–15.0)
MCH: 32.4 pg (ref 26.0–34.0)
MCHC: 33.5 g/dL (ref 30.0–36.0)
MCV: 96.9 fL (ref 80.0–100.0)
Platelets: 273 10*3/uL (ref 150–400)
RBC: 4.84 MIL/uL (ref 3.87–5.11)
RDW: 13.8 % (ref 11.5–15.5)
WBC: 9.4 10*3/uL (ref 4.0–10.5)
nRBC: 0 % (ref 0.0–0.2)

## 2023-09-27 LAB — BASIC METABOLIC PANEL
Anion gap: 11 (ref 5–15)
BUN: 9 mg/dL (ref 6–20)
CO2: 25 mmol/L (ref 22–32)
Calcium: 9.4 mg/dL (ref 8.9–10.3)
Chloride: 101 mmol/L (ref 98–111)
Creatinine, Ser: 0.53 mg/dL (ref 0.44–1.00)
GFR, Estimated: >60 mL/min (ref >60)
Glucose, Bld: 96 mg/dL (ref 70–99)
Potassium: 3.7 mmol/L (ref 3.5–5.1)
Sodium: 137 mmol/L (ref 135–145)

## 2023-09-27 LAB — SURGICAL PCR SCREEN
MRSA, PCR: NEGATIVE
Staphylococcus aureus: NEGATIVE

## 2023-09-27 NOTE — Progress Notes (Signed)
Anesthesia PAT Evaluation:  Case: [id number] Date/Time: 10/02/23 1215   Procedure: RIGHT TOTAL HIP ARTHROPLASTY ANTERIOR APPROACH (Right: Hip) - NEEDS RNFA   Anesthesia type: Spinal   Pre-op diagnosis: right hip osteoarthritis   Location: [facility] ROOM 07 / [facility]   Surgeons: Eldred Manges, MD       DISCUSSION: Patient is a 62 year old female scheduled for the above procedure.   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    She was evaluated by cardiologist Dr. Swaziland on 08/10/20 for coronary and aortic atherosclerosis with family history of early CAD. She also reported DOE when walking her dogs and some epigastric pain that she attributed to her hiatal hernia. He did not appreciate a murmur. He did order a CCTA which was done on 09/15/20 and showed an elevated coronary calcium score of 172 (96 percentile for age and sex matched control) but only minimal nonobstructive CAD (0-24% LAD, CX). He recommended risk factor modification with including smoking cessation and statin therapy. Report send to her PCP Dois Davenport, MD. He did not specify any specific cardiology follow-up. She denied any new symptoms but following the sudden death of her 75 year old son from an MI (he collapsed at her home), she requested routine follow-up with Dr. Swaziland that is scheduled for 10/15/23.   Followed by ENT for history of supraglottic SCC in 2019. She was followed by Dr. Dillard Cannon until his recent retirement and is now seeing Dr. Cheron Schaumann, last visit 03/26/23. As above, she was grieving the loss of her son from an MI earlier that month. She had started smoking again  up to 2PPD. Laryngoscopy performed demonstrating "honey colored crusted lesion at the left false cord, this does not appear to be an exophytic mass lesion. There is some mucosal asymmetry of the right arytenoid, without evidence of focal mass or ulceration." Continued close surveillance recommended. A dedicated CT neck also recommended, as well as a surveillance chest CT LCS. Neck CT is still pending. Chest CT 07/23/23 showed emphysema, smoking related bronchiolitis, no suspicious pulmonary nodules, aortica and advance coronary artery calcifications, enlarged pulmonary trunk indicative of pulmonary artery hypertension.  I evaluated her during PAT visit given history and pending cardiology visit with Dr. Swaziland. She was not having any new CV symptomology but wanted to get re-established given family history. As mentioned, CAC 96th percentile in 08/2020 but with only minimal non-obstructive CAD. I did not appreciate a murmur on exam, but we did discuss that recent chest CT suggested pulmonary artery hypertension, so she may benefit from an echocardiogram at some point. She is struggling with grief and now worsening mobility issues for at least the past 3 months. She lives independently with her youngest daughter living next door. She had been able to do her own shopping and walk her three dogs ~ 12 blocks, but in the past 3 months is in too much pain. She feels she has been primarily homebound and is desperate to have surgery done in hopes her pain and mobility will both improve. She has fibromyalgia with chronic pains all over, and has her typical reflux symptoms after eating certain types of food, but otherwise denied any symptoms concerning for anginal  pains. She denied palpitations, syncope. She feels her exertional dyspnea is stable. She does not require supplement O2. Last "bronchitis" was in the Spring.  She does not see a pulmonologist currently. She is on Trelegy daily and albuterol as needed per Dr.  [facility]. Uses albuterol MDI about 2-3 times per week and nebulizer only with exacerbations. She is unable to lie on her back flat due to chronic back pain.   She is adamant she does not want fentanyl used for anesthesia. This seems to be more related the stigma against fentanyl as it relates to illegal use and not due to her having a previous reaction. I attempted to discuss its use more as as adjunct and for pain control and not as primary anesthetic, but she insisted that she does not want it in her body.   Overall cordial visit during PAT but she had moments of agitation or frustration about questions about her cardiac history and aversion to fentanyl use. It sounds like physically and emotionally she is drained from her sons death and worsening musculoskeletal pains and mobility issues. She actually tried to move surgery up. Her daughter works for [employer] and is taking two weeks off from work to help her postoperatively. Currently she cannot meet 4 METS, but was independent of ADLs and walking her dogs until about 3 months ago. She denied CV/HF symptoms. Although CAC is high, she had minimal nonobstructive CAD in 08/2020. CT did suggest pulmonary hypertension, but she is not having any new respiratory symptoms, volume overload, and does not require home supplement home O2. EKG showed SR with sinus arrhythmia. PAT labs acceptable for OR.   I discussed above with anesthesiologist Anice Paganini, MD with no additional preoperative recommendations at this time, although anesthesia team will have to re-evaluate on the day of surgery. I communicated this with patient.       VS: BP 120/69   Pulse 81   Temp 36.7 C   Resp 18   Ht 5\' 2"  (1.575 m)   Wt 82.4 kg   SpO2 93%   BMI 33.23 kg/m  Using a hospital wheelchair. Cane in hands. She did drive herself. No conversational dyspnea. Heart RRR, no murmur noted. Lungs diminished but clear. No carotid bruits noted. No ankle edema noted.    PROVIDERS: Dois Davenport, MD is PCP [facility])   Swaziland, Peter, MD is cardiologist Cheron Schaumann, DO is ENT   LABS: Labs reviewed: Acceptable for surgery. (all labs ordered are listed, but only abnormal results are displayed)  Labs Reviewed  CBC - Abnormal; Notable for the following components:      Result Value   Hemoglobin 15.7 (*)    HCT 46.9 (*)    All other components within normal limits  SURGICAL PCR SCREEN  BASIC METABOLIC PANEL  TYPE AND SCREEN     IMAGES: Xray L-spine 09/04/23: Images in [facility]/PACS.  Xray right hip 08/02/23: Impression: Moderate to severe right hip osteoarthritis.   CT Chest LCS 07/23/23: IMPRESSION: 1. Lung-RADS 1, negative. Continue annual screening with low-dose chest CT without contrast in 12 months. 2. Age advanced coronary artery calcification. 3.  Aortic atherosclerosis (ICD10-I70.0). 4. Enlarged pulmonic trunk, indicative of pulmonary arterial hypertension. 5.  Emphysema (ICD10-J43.9).  Flexible Laryngoscopy 03/28/23 ([facility]): Flexible laryngoscopy shows patent anterior nasal cavity with minimal  crusting, no discharge or infection.  Normal base of tongue and supraglottis  Normal vocal cord mobility without vocal cord nodule, mass, polyp or  tumor. Hypopharynx  normal without mass, pooling of secretions or  aspiration.   MRI L-spine 04/21/22: IMPRESSION: - Mild degenerative change L2-3 and L3-4 without significant stenosis - Left foraminal disc protrusion L4-5 with moderate left foraminal narrowing unchanged from the prior study. Moderate central canal stenosis with mild progression from 2019 - Right foraminal encroachment L5-S1 due to disc and osteophyte complex, unchanged   EKG: 09/27/23: Normal sinus rhythm with sinus arrhythmia   CV: CT Coronary 09/15/20: - Aorta:  Normal size.  No calcifications.  No dissection. - Aortic Valve:  Trileaflet.  No calcifications. - Coronary Arteries:  Normal coronary origin.  Left  dominance. - RCA is a small nondominant artery.  There is no plaque. - Left main is a large artery that gives rise to LAD and LCX arteries. - LAD is a large vessel; there is minimal (0-24%) calcified stenosis in the proximal vessel followed by minimal (0-24%) calcified stenoses in the proximal to mid and mid vessel. - LCX is a dominant artery that gives rise to large OM1, OM2 and PDA. There is minimal (0-24%) calcified stenosis in the proximal vessel.   - Other findings: Normal pulmonary vein drainage into the left atrium. Normal let atrial appendage without a thrombus. Normal size of the pulmonary artery.   IMPRESSION: 1. Coronary calcium score of 172. This was 41 percentile for age and sex matched control. 2. Normal coronary origin with left dominance. 3. Miinimal nonobstructive CAD; CAD-RADS 1.   Past Medical History:  Diagnosis Date   Anxiety 11/11/2011   Aortic atherosclerosis (HCC)    Arthritis    Asthma    Bursitis    right shoulder   Chronic back pain    Chronic neck pain    Chronic shoulder pain    COPD (chronic obstructive pulmonary disease) (HCC)    continues to smoke 1ppd   DDD (degenerative disc disease)    Depression    Fibromyalgia    GERD (gastroesophageal reflux disease)    Heart murmur    as a child per pt   Herpes    History of radiation therapy 08/12/18- 10/03/18   Larynx, prescribed dose of 63 Gy in 28 fractions. She chose to stop treatments after 27 fractions.    IBS (irritable bowel syndrome)    Lumbar radiculopathy    Seasonal allergies    Smoker    Thyroid nodule    goiters    Past Surgical History:  Procedure Laterality Date   CHOLECYSTECTOMY     DIRECT LARYNGOSCOPY N/A 07/11/2018   Procedure: DIRECT LARYNGOSCOPY WITH BIOPSY;  Surgeon: Drema Halon, MD;  Location: [facility];  Service: ENT;  Laterality: N/A;   HERNIA REPAIR     Hiatal    HIATAL HERNIA REPAIR     TUBAL LIGATION  1983    MEDICATIONS:   albuterol (ACCUNEB) 1.25 MG/3ML nebulizer solution   albuterol (PROVENTIL HFA;VENTOLIN HFA) 108 (90 BASE) MCG/ACT inhaler   albuterol (PROVENTIL) (2.5 MG/3ML) 0.083% nebulizer solution   cyclobenzaprine (FLEXERIL) 10 MG tablet   fluticasone (FLONASE) 50 MCG/ACT nasal spray   HYDROcodone-acetaminophen (NORCO) 7.5-325 MG tablet   levothyroxine (SYNTHROID) 25 MCG tablet   LORazepam (ATIVAN) 0.5 MG tablet   rosuvastatin (CRESTOR) 10 MG tablet   TRELEGY ELLIPTA 200-62.5-25 MCG/ACT AEPB   Vitamin D, Ergocalciferol, (DRISDOL) 1.25 MG (50000 UNIT) CAPS capsule   No current facility-administered medications for this encounter.    Shonna Chock, PA-C Surgical Short Stay/Anesthesiology [facility] Phone [phone] [facility] Phone [phone] 09/27/2023  6:49 PM

## 2023-09-27 NOTE — Progress Notes (Signed)
PCP - Dr. Nadyne Coombes Cardiologist - Dr. Peter Swaziland  PPM/ICD - denies   Chest x-ray - 07/08/21 EKG - 09/27/23 Stress Test - denies ECHO - denies Cardiac Cath - 08/05/2001  Sleep Study - denies   DM- denies  ASA/Blood Thinner Instructions: n/a   ERAS Protcol - yes PRE-SURGERY Ensure given at PAT  COVID TEST- n/a   Anesthesia review: yes, cardiac hx. Pt saw Dr. Swaziland 08/10/20. She has an upcoming appt on 11/19. Pt states she asked her PCP to refer her back to cardiology because her son passed away 6 months ago due to a "massive MI" and she wants to be checked since there is also a family hx of cardiac disease. Revonda Standard saw pt in PAT  Patient denies shortness of breath, fever, cough and chest pain at PAT appointment   All instructions explained to the patient, with a verbal understanding of the material. Patient agrees to go over the instructions while at home for a better understanding.  The opportunity to ask questions was provided.

## 2023-09-27 NOTE — Anesthesia Preprocedure Evaluation (Signed)
Anesthesia Evaluation  Patient identified by MRN, date of birth, ID band Patient awake    Reviewed: Allergy & Precautions, NPO status , Patient's Chart, lab work & pertinent test results  Airway Mallampati: II  TM Distance: >3 FB Neck ROM: Full    Dental  (+) Chipped, Poor Dentition,    Pulmonary asthma , COPD,  COPD inhaler, Current Smoker and Patient abstained from smoking.    + decreased breath sounds      Cardiovascular negative cardio ROS  Rhythm:Regular Rate:Normal     Neuro/Psych   Anxiety Depression     Neuromuscular disease    GI/Hepatic Neg liver ROS,GERD  ,,  Endo/Other  negative endocrine ROS    Renal/GU negative Renal ROS     Musculoskeletal  (+) Arthritis ,  Fibromyalgia -  Abdominal  (+) + obese  Peds  Hematology negative hematology ROS (+)   Anesthesia Other Findings   Reproductive/Obstetrics                             Anesthesia Physical Anesthesia Plan  ASA: III  Anesthesia Plan: Spinal   Post-op Pain Management:    Induction: Intravenous  PONV Risk Score and Plan: 1 and Ondansetron, Treatment may vary due to age or medical condition and Propofol infusion  Airway Management Planned: Simple Face Mask  Additional Equipment: None  Intra-op Plan:   Post-operative Plan:   Informed Consent: I have reviewed the patients History and Physical, chart, labs and discussed the procedure including the risks, benefits and alternatives for the proposed anesthesia with the patient or authorized representative who has indicated his/her understanding and acceptance.     Dental advisory given  Plan Discussed with: CRNA  Anesthesia Plan Comments: (See PAT note written 09/27/2023 by Shonna Chock, PA-C. She is adamant about not wanting fentanyl.  )        Anesthesia Quick Evaluation

## 2023-10-02 ENCOUNTER — Encounter (HOSPITAL_COMMUNITY): Payer: Self-pay | Admitting: Orthopaedic Surgery

## 2023-10-02 ENCOUNTER — Other Ambulatory Visit: Payer: Self-pay | Admitting: Physician Assistant

## 2023-10-02 ENCOUNTER — Other Ambulatory Visit: Payer: Self-pay

## 2023-10-02 ENCOUNTER — Observation Stay (HOSPITAL_COMMUNITY)
Admission: RE | Admit: 2023-10-02 | Discharge: 2023-10-03 | Disposition: A | Discharge status: Home / Self Care | Payer: 59 | Attending: Orthopaedic Surgery | Admitting: Orthopaedic Surgery

## 2023-10-02 ENCOUNTER — Encounter (HOSPITAL_COMMUNITY)
Admission: RE | Disposition: A | Discharge status: Home / Self Care | Payer: Self-pay | Source: Home / Self Care | Attending: Orthopaedic Surgery

## 2023-10-02 ENCOUNTER — Ambulatory Visit (HOSPITAL_COMMUNITY): Payer: 59

## 2023-10-02 ENCOUNTER — Ambulatory Visit (HOSPITAL_COMMUNITY): Payer: 59 | Admitting: Vascular Surgery

## 2023-10-02 ENCOUNTER — Ambulatory Visit (HOSPITAL_BASED_OUTPATIENT_CLINIC_OR_DEPARTMENT_OTHER): Payer: 59

## 2023-10-02 DIAGNOSIS — F17210 Nicotine dependence, cigarettes, uncomplicated: Secondary | ICD-10-CM

## 2023-10-02 DIAGNOSIS — J449 Chronic obstructive pulmonary disease, unspecified: Secondary | ICD-10-CM | POA: Diagnosis not present

## 2023-10-02 DIAGNOSIS — J45909 Unspecified asthma, uncomplicated: Secondary | ICD-10-CM | POA: Insufficient documentation

## 2023-10-02 DIAGNOSIS — M1611 Unilateral primary osteoarthritis, right hip: Secondary | ICD-10-CM | POA: Diagnosis not present

## 2023-10-02 DIAGNOSIS — Z96641 Presence of right artificial hip joint: Principal | ICD-10-CM | POA: Diagnosis present

## 2023-10-02 DIAGNOSIS — Z8521 Personal history of malignant neoplasm of larynx: Secondary | ICD-10-CM | POA: Insufficient documentation

## 2023-10-02 HISTORY — PX: TOTAL HIP ARTHROPLASTY: SHX124

## 2023-10-02 SURGERY — ARTHROPLASTY, HIP, TOTAL, ANTERIOR APPROACH
Anesthesia: Spinal | Site: Hip | Laterality: Right

## 2023-10-02 MED ORDER — DEXMEDETOMIDINE HCL IN NACL 80 MCG/20ML IV SOLN
INTRAVENOUS | Status: DC | PRN
  Administered 2023-10-02: 20 ug via INTRAVENOUS

## 2023-10-02 MED ORDER — ROSUVASTATIN CALCIUM 5 MG PO TABS
10.0000 mg | ORAL_TABLET | Freq: Every day | ORAL | Status: DC
  Administered 2023-10-02 – 2023-10-03 (×2): 10 mg via ORAL
  Filled 2023-10-02 (×2): qty 2

## 2023-10-02 MED ORDER — OXYCODONE HCL 5 MG/5ML PO SOLN: 5.0000 mg | Freq: Once | ORAL | Status: DC | PRN

## 2023-10-02 MED ORDER — BUPIVACAINE HCL (PF) 0.5 % IJ SOLN
INTRAMUSCULAR | Status: AC
  Filled 2023-10-02: qty 10

## 2023-10-02 MED ORDER — MIDAZOLAM HCL 2 MG/2ML IJ SOLN
INTRAMUSCULAR | Status: DC | PRN
  Administered 2023-10-02 (×2): 1 mg via INTRAVENOUS

## 2023-10-02 MED ORDER — NAPROXEN 250 MG PO TABS
250.0000 mg | ORAL_TABLET | Freq: Two times a day (BID) | ORAL | Status: DC
  Administered 2023-10-02 – 2023-10-03 (×2): 250 mg via ORAL
  Filled 2023-10-02 (×2): qty 1

## 2023-10-02 MED ORDER — BUPIVACAINE IN DEXTROSE 0.75-8.25 % IT SOLN
INTRATHECAL | Status: DC | PRN
Start: 2023-10-02 — End: 2023-10-02
  Administered 2023-10-02: 2 mL via INTRATHECAL

## 2023-10-02 MED ORDER — PHENOL 1.4 % MT LIQD: 1.0000 | OROMUCOSAL | Status: DC | PRN

## 2023-10-02 MED ORDER — DOCUSATE SODIUM 100 MG PO CAPS
100.0000 mg | ORAL_CAPSULE | Freq: Two times a day (BID) | ORAL | Status: DC
  Administered 2023-10-03: 100 mg via ORAL
  Filled 2023-10-02 (×2): qty 1

## 2023-10-02 MED ORDER — HYDROMORPHONE HCL 1 MG/ML IJ SOLN
INTRAMUSCULAR | Status: AC
  Administered 2023-10-02: 1 mg
  Filled 2023-10-02: qty 1

## 2023-10-02 MED ORDER — DEXAMETHASONE SODIUM PHOSPHATE 10 MG/ML IJ SOLN
INTRAMUSCULAR | Status: DC | PRN
  Administered 2023-10-02: 5 mg via INTRAVENOUS

## 2023-10-02 MED ORDER — FLUTICASONE FUROATE-VILANTEROL 200-25 MCG/ACT IN AEPB
1.0000 | INHALATION_SPRAY | Freq: Every day | RESPIRATORY_TRACT | Status: DC
  Administered 2023-10-02: 1 via RESPIRATORY_TRACT
  Filled 2023-10-02: qty 28

## 2023-10-02 MED ORDER — HYDROMORPHONE HCL 1 MG/ML IJ SOLN
INTRAMUSCULAR | Status: AC
  Filled 2023-10-02: qty 0.5

## 2023-10-02 MED ORDER — FENTANYL CITRATE (PF) 250 MCG/5ML IJ SOLN
INTRAMUSCULAR | Status: AC
  Filled 2023-10-02: qty 5

## 2023-10-02 MED ORDER — METHOCARBAMOL 1000 MG/10ML IJ SOLN: 500.0000 mg | Freq: Four times a day (QID) | INTRAMUSCULAR | Status: DC | PRN

## 2023-10-02 MED ORDER — ACETAMINOPHEN 10 MG/ML IV SOLN
INTRAVENOUS | Status: AC
  Filled 2023-10-02: qty 100

## 2023-10-02 MED ORDER — BUPIVACAINE LIPOSOME 1.3 % IJ SUSP
INTRAMUSCULAR | Status: AC
  Filled 2023-10-02: qty 20

## 2023-10-02 MED ORDER — ACETAMINOPHEN 325 MG PO TABS
325.0000 mg | ORAL_TABLET | Freq: Four times a day (QID) | ORAL | Status: DC | PRN
  Administered 2023-10-03: 650 mg via ORAL
  Filled 2023-10-02: qty 2

## 2023-10-02 MED ORDER — METOCLOPRAMIDE HCL 5 MG PO TABS: 5.0000 mg | ORAL_TABLET | Freq: Three times a day (TID) | ORAL | Status: DC | PRN

## 2023-10-02 MED ORDER — ONDANSETRON HCL 4 MG/2ML IJ SOLN
INTRAMUSCULAR | Status: DC | PRN
  Administered 2023-10-02: 4 mg via INTRAVENOUS

## 2023-10-02 MED ORDER — HYDROMORPHONE HCL 1 MG/ML IJ SOLN
INTRAMUSCULAR | Status: AC
  Filled 2023-10-02: qty 1

## 2023-10-02 MED ORDER — HYDROMORPHONE HCL 1 MG/ML IJ SOLN
0.2500 mg | INTRAMUSCULAR | Status: DC | PRN
  Administered 2023-10-02 (×4): 0.5 mg via INTRAVENOUS

## 2023-10-02 MED ORDER — ALBUTEROL SULFATE HFA 108 (90 BASE) MCG/ACT IN AERS: 2.0000 | INHALATION_SPRAY | Freq: Four times a day (QID) | RESPIRATORY_TRACT | Status: DC | PRN

## 2023-10-02 MED ORDER — KETOROLAC TROMETHAMINE 30 MG/ML IJ SOLN
30.0000 mg | Freq: Once | INTRAMUSCULAR | Status: AC
  Administered 2023-10-02: 30 mg via INTRAVENOUS

## 2023-10-02 MED ORDER — PROPOFOL 10 MG/ML IV BOLUS
INTRAVENOUS | Status: AC
  Filled 2023-10-02: qty 20

## 2023-10-02 MED ORDER — METOCLOPRAMIDE HCL 5 MG/ML IJ SOLN: 5.0000 mg | Freq: Three times a day (TID) | INTRAMUSCULAR | Status: DC | PRN

## 2023-10-02 MED ORDER — LACTATED RINGERS IV SOLN: INTRAVENOUS | Status: DC

## 2023-10-02 MED ORDER — ASPIRIN 325 MG PO TBEC
325.0000 mg | DELAYED_RELEASE_TABLET | Freq: Every day | ORAL | Status: DC
  Administered 2023-10-03: 325 mg via ORAL
  Filled 2023-10-02: qty 1

## 2023-10-02 MED ORDER — MENTHOL 3 MG MT LOZG: 1.0000 | LOZENGE | OROMUCOSAL | Status: DC | PRN

## 2023-10-02 MED ORDER — CEFAZOLIN SODIUM-DEXTROSE 2-4 GM/100ML-% IV SOLN
2.0000 g | INTRAVENOUS | Status: AC
  Administered 2023-10-02: 2 g via INTRAVENOUS
  Filled 2023-10-02: qty 100

## 2023-10-02 MED ORDER — ALBUTEROL SULFATE (2.5 MG/3ML) 0.083% IN NEBU: 2.5000 mg | INHALATION_SOLUTION | Freq: Four times a day (QID) | RESPIRATORY_TRACT | Status: DC | PRN

## 2023-10-02 MED ORDER — KETOROLAC TROMETHAMINE 30 MG/ML IJ SOLN
INTRAMUSCULAR | Status: AC
  Filled 2023-10-02: qty 1

## 2023-10-02 MED ORDER — HYDROMORPHONE HCL 1 MG/ML IJ SOLN
0.5000 mg | INTRAMUSCULAR | Status: DC | PRN
Start: 2023-10-02 — End: 2023-10-03
  Administered 2023-10-02 – 2023-10-03 (×2): 1 mg via INTRAVENOUS
  Filled 2023-10-02 (×3): qty 1

## 2023-10-02 MED ORDER — HYDROMORPHONE HCL 1 MG/ML IJ SOLN
0.2500 mg | INTRAMUSCULAR | Status: DC | PRN
  Administered 2023-10-02: 0.5 mg via INTRAVENOUS

## 2023-10-02 MED ORDER — POLYETHYLENE GLYCOL 3350 17 G PO PACK: 17.0000 g | PACK | Freq: Every day | ORAL | Status: DC | PRN

## 2023-10-02 MED ORDER — FLUTICASONE PROPIONATE 50 MCG/ACT NA SUSP: 1.0000 | Freq: Every day | NASAL | Status: DC

## 2023-10-02 MED ORDER — DEXAMETHASONE SODIUM PHOSPHATE 10 MG/ML IJ SOLN
INTRAMUSCULAR | Status: AC
  Filled 2023-10-02: qty 1

## 2023-10-02 MED ORDER — ALBUMIN HUMAN 5 % IV SOLN: INTRAVENOUS | Status: DC | PRN

## 2023-10-02 MED ORDER — LEVOTHYROXINE SODIUM 25 MCG PO TABS
25.0000 ug | ORAL_TABLET | Freq: Every day | ORAL | Status: DC
Start: 2023-10-03 — End: 2023-10-03
  Administered 2023-10-03: 25 ug via ORAL
  Filled 2023-10-02: qty 1

## 2023-10-02 MED ORDER — FENTANYL CITRATE (PF) 250 MCG/5ML IJ SOLN
INTRAMUSCULAR | Status: DC | PRN
  Administered 2023-10-02 (×2): 50 ug via INTRAVENOUS

## 2023-10-02 MED ORDER — METHOCARBAMOL 500 MG PO TABS
500.0000 mg | ORAL_TABLET | Freq: Four times a day (QID) | ORAL | Status: DC | PRN
  Administered 2023-10-03: 500 mg via ORAL
  Filled 2023-10-02: qty 1

## 2023-10-02 MED ORDER — PROPOFOL 10 MG/ML IV BOLUS
INTRAVENOUS | Status: DC | PRN
  Administered 2023-10-02: 50 mg via INTRAVENOUS
  Administered 2023-10-02 (×2): 30 mg via INTRAVENOUS

## 2023-10-02 MED ORDER — OXYCODONE HCL 5 MG PO TABS
5.0000 mg | ORAL_TABLET | ORAL | Status: DC | PRN
Start: 2023-10-02 — End: 2023-10-03
  Administered 2023-10-03: 10 mg via ORAL
  Filled 2023-10-02: qty 2

## 2023-10-02 MED ORDER — PHENYLEPHRINE HCL-NACL 20-0.9 MG/250ML-% IV SOLN
INTRAVENOUS | Status: DC | PRN
  Administered 2023-10-02: 25 ug/min via INTRAVENOUS

## 2023-10-02 MED ORDER — PROPOFOL 500 MG/50ML IV EMUL
INTRAVENOUS | Status: DC | PRN
  Administered 2023-10-02: 50 ug/kg/min via INTRAVENOUS

## 2023-10-02 MED ORDER — OXYCODONE HCL 5 MG PO TABS: 5.0000 mg | ORAL_TABLET | Freq: Once | ORAL | Status: DC | PRN

## 2023-10-02 MED ORDER — 0.9 % SODIUM CHLORIDE (POUR BTL) OPTIME
TOPICAL | Status: DC | PRN
  Administered 2023-10-02: 1000 mL

## 2023-10-02 MED ORDER — SODIUM CHLORIDE 0.9 % IV SOLN
12.5000 mg | INTRAVENOUS | Status: DC | PRN
  Filled 2023-10-02: qty 0.5

## 2023-10-02 MED ORDER — ACETAMINOPHEN 10 MG/ML IV SOLN
1000.0000 mg | Freq: Once | INTRAVENOUS | Status: DC | PRN
  Administered 2023-10-02: 1000 mg via INTRAVENOUS

## 2023-10-02 MED ORDER — ONDANSETRON HCL 4 MG PO TABS: 4.0000 mg | ORAL_TABLET | Freq: Four times a day (QID) | ORAL | Status: DC | PRN

## 2023-10-02 MED ORDER — ONDANSETRON HCL 4 MG/2ML IJ SOLN: 4.0000 mg | Freq: Four times a day (QID) | INTRAMUSCULAR | Status: DC | PRN

## 2023-10-02 MED ORDER — OXYCODONE HCL 5 MG PO TABS
10.0000 mg | ORAL_TABLET | ORAL | Status: DC | PRN
  Administered 2023-10-03 (×2): 10 mg via ORAL
  Filled 2023-10-02 (×2): qty 2

## 2023-10-02 MED ORDER — UMECLIDINIUM BROMIDE 62.5 MCG/ACT IN AEPB
1.0000 | INHALATION_SPRAY | Freq: Every day | RESPIRATORY_TRACT | Status: DC
  Administered 2023-10-02: 1 via RESPIRATORY_TRACT
  Filled 2023-10-02: qty 7

## 2023-10-02 MED ORDER — ONDANSETRON HCL 4 MG/2ML IJ SOLN
INTRAMUSCULAR | Status: AC
  Filled 2023-10-02: qty 2

## 2023-10-02 MED ORDER — DIPHENHYDRAMINE HCL 12.5 MG/5ML PO ELIX: 12.5000 mg | ORAL_SOLUTION | ORAL | Status: DC | PRN

## 2023-10-02 MED ORDER — PROPOFOL 1000 MG/100ML IV EMUL
INTRAVENOUS | Status: AC
  Filled 2023-10-02: qty 100

## 2023-10-02 MED ORDER — TRANEXAMIC ACID-NACL 1000-0.7 MG/100ML-% IV SOLN
1000.0000 mg | INTRAVENOUS | Status: AC
  Administered 2023-10-02: 1000 mg via INTRAVENOUS
  Filled 2023-10-02: qty 100

## 2023-10-02 MED ORDER — MIDAZOLAM HCL 2 MG/2ML IJ SOLN
INTRAMUSCULAR | Status: AC
  Filled 2023-10-02: qty 2

## 2023-10-02 MED ORDER — VITAMIN D (ERGOCALCIFEROL) 1.25 MG (50000 UNIT) PO CAPS
50000.0000 [IU] | ORAL_CAPSULE | ORAL | Status: DC
  Filled 2023-10-02: qty 1

## 2023-10-02 MED ORDER — ORAL CARE MOUTH RINSE: 15.0000 mL | Freq: Once | OROMUCOSAL | Status: AC

## 2023-10-02 MED ORDER — DEXTROSE-SODIUM CHLORIDE 5-0.45 % IV SOLN: INTRAVENOUS | Status: DC

## 2023-10-02 MED ORDER — CHLORHEXIDINE GLUCONATE 0.12 % MT SOLN
15.0000 mL | Freq: Once | OROMUCOSAL | Status: AC
  Administered 2023-10-02: 15 mL via OROMUCOSAL
  Filled 2023-10-02: qty 15

## 2023-10-02 MED ORDER — HYDROMORPHONE HCL 1 MG/ML IJ SOLN
INTRAMUSCULAR | Status: DC | PRN
  Administered 2023-10-02: .5 mg via INTRAVENOUS

## 2023-10-02 SURGICAL SUPPLY — 63 items
APL SKNCLS STERI-STRIP NONHPOA (GAUZE/BANDAGES/DRESSINGS) ×1
BAG COUNTER SPONGE SURGICOUNT (BAG) ×1 IMPLANT
BAG SPNG CNTER NS LX DISP (BAG) ×1
BENZOIN TINCTURE PRP APPL 2/3 (GAUZE/BANDAGES/DRESSINGS) ×1 IMPLANT
BLADE CLIPPER SURG (BLADE) IMPLANT
BLADE SAW SGTL 18X1.27X75 (BLADE) ×1 IMPLANT
COVER PERINEAL POST (MISCELLANEOUS) ×1 IMPLANT
COVER SURGICAL LIGHT HANDLE (MISCELLANEOUS) ×1 IMPLANT
CUP SECTOR GRIPTON 50MM (Cup) IMPLANT
DRAPE C-ARM 42X72 X-RAY (DRAPES) ×1 IMPLANT
DRAPE IMP U-DRAPE 54X76 (DRAPES) ×1 IMPLANT
DRAPE STERI IOBAN 125X83 (DRAPES) ×1 IMPLANT
DRAPE U-SHAPE 47X51 STRL (DRAPES) ×3 IMPLANT
DRSG AQUACEL AG ADV 3.5X10 (GAUZE/BANDAGES/DRESSINGS) IMPLANT
DRSG MEPILEX POST OP 4X8 (GAUZE/BANDAGES/DRESSINGS) ×1 IMPLANT
DURAPREP 26ML APPLICATOR (WOUND CARE) ×1 IMPLANT
ELECT BLADE 4.0 EZ CLEAN MEGAD (MISCELLANEOUS)
ELECT CAUTERY BLADE 6.4 (BLADE) ×1 IMPLANT
ELECT REM PT RETURN 9FT ADLT (ELECTROSURGICAL) ×1
ELECTRODE BLDE 4.0 EZ CLN MEGD (MISCELLANEOUS) IMPLANT
ELECTRODE REM PT RTRN 9FT ADLT (ELECTROSURGICAL) ×1 IMPLANT
ELIMINATOR HOLE APEX DEPUY (Hips) IMPLANT
FACESHIELD WRAPAROUND (MASK) ×2 IMPLANT
FACESHIELD WRAPAROUND OR TEAM (MASK) ×2 IMPLANT
GAUZE PAD ABD 8X10 STRL (GAUZE/BANDAGES/DRESSINGS) IMPLANT
GLOVE BIOGEL PI IND STRL 8 (GLOVE) ×2 IMPLANT
GLOVE ORTHO TXT STRL SZ7.5 (GLOVE) ×2 IMPLANT
GOWN STRL REUS W/ TWL LRG LVL3 (GOWN DISPOSABLE) ×1 IMPLANT
GOWN STRL REUS W/ TWL XL LVL3 (GOWN DISPOSABLE) ×1 IMPLANT
GOWN STRL REUS W/TWL 2XL LVL3 (GOWN DISPOSABLE) ×1 IMPLANT
GOWN STRL REUS W/TWL LRG LVL3 (GOWN DISPOSABLE) ×1
GOWN STRL REUS W/TWL XL LVL3 (GOWN DISPOSABLE) ×1
HEAD FEMORAL 32 CERAMIC (Hips) IMPLANT
KIT BASIN OR (CUSTOM PROCEDURE TRAY) ×1 IMPLANT
KIT TURNOVER KIT B (KITS) ×1 IMPLANT
LINER ACETABULAR 32X50 (Liner) IMPLANT
MANIFOLD NEPTUNE II (INSTRUMENTS) ×1 IMPLANT
NDL HYPO 21X1 ECLIPSE (NEEDLE) ×1 IMPLANT
NEEDLE HYPO 21X1 ECLIPSE (NEEDLE) ×1 IMPLANT
NS IRRIG 1000ML POUR BTL (IV SOLUTION) ×1 IMPLANT
PACK TOTAL JOINT (CUSTOM PROCEDURE TRAY) ×1 IMPLANT
PAD ARMBOARD 7.5X6 YLW CONV (MISCELLANEOUS) ×2 IMPLANT
PULSAVAC PLUS IRRIG FAN TIP (DISPOSABLE)
STAPLER SKIN PROX WIDE 3.9 (STAPLE) IMPLANT
STEM FEMORAL SZ 6MM STD ACTIS (Stem) IMPLANT
STRIP CLOSURE SKIN 1/2X4 (GAUZE/BANDAGES/DRESSINGS) ×1 IMPLANT
SUT STRATAFIX 0 PDS 27 VIOLET (SUTURE) ×1
SUT VIC AB 0 CT1 27 (SUTURE) ×2
SUT VIC AB 0 CT1 27XBRD ANBCTR (SUTURE) ×1 IMPLANT
SUT VIC AB 2-0 CT1 27 (SUTURE) ×2
SUT VIC AB 2-0 CT1 TAPERPNT 27 (SUTURE) ×1 IMPLANT
SUT VIC AB 4-0 PS2 18 (SUTURE) IMPLANT
SUT VIC AB CT1 27XBRD ANBCTRL (SUTURE) ×2
SUT VICRYL 4-0 PS2 18IN ABS (SUTURE) ×1 IMPLANT
SUT VLOC 180 0 24IN GS25 (SUTURE) ×1 IMPLANT
SUTURE STRATFX 0 PDS 27 VIOLET (SUTURE) IMPLANT
SYR 20CC LL (SYRINGE) ×1 IMPLANT
TAPE CLOTH SURG 4X10 WHT LF (GAUZE/BANDAGES/DRESSINGS) IMPLANT
TIP FAN IRRIG PULSAVAC PLUS (DISPOSABLE) IMPLANT
TOWEL GREEN STERILE (TOWEL DISPOSABLE) ×2 IMPLANT
TOWEL GREEN STERILE FF (TOWEL DISPOSABLE) ×1 IMPLANT
TRAY FOLEY MTR SLVR 16FR STAT (SET/KITS/TRAYS/PACK) ×1 IMPLANT
WATER STERILE IRR 1000ML POUR (IV SOLUTION) ×2 IMPLANT

## 2023-10-02 NOTE — Transfer of Care (Signed)
Immediate Anesthesia Transfer of Care Note  Patient: Sheri Rivera  Procedure(s) Performed: RIGHT TOTAL HIP ARTHROPLASTY ANTERIOR APPROACH (Right: Hip)  Patient Location: PACU  Anesthesia Type:General  Level of Consciousness: awake  Airway & Oxygen Therapy: Patient Spontanous Breathing and Patient connected to face mask oxygen  Post-op Assessment: Report given to RN and Post -op Vital signs reviewed and stable  Post vital signs: Reviewed and stable  Last Vitals:  Vitals Value Taken Time  BP 108/62 10/02/23 1530  Temp    Pulse 65 10/02/23 1532  Resp 15 10/02/23 1532  SpO2 97 % 10/02/23 1532  Vitals shown include unfiled device data.  Last Pain:  Vitals:   10/02/23 1056  TempSrc:   PainSc: 5       Patients Stated Pain Goal: 3 (10/02/23 1056)  Complications: No notable events documented.

## 2023-10-02 NOTE — Interval H&P Note (Signed)
History and Physical Interval Note:  10/02/2023 12:30 PM  Sheri Rivera  has presented today for surgery, with the diagnosis of right hip osteoarthritis.  The various methods of treatment have been discussed with the patient and family. After consideration of risks, benefits and other options for treatment, the patient has consented to  Procedure(s) with comments: RIGHT TOTAL HIP ARTHROPLASTY ANTERIOR APPROACH (Right) - NEEDS RNFA as a surgical intervention.  The patient's history has been reviewed, patient examined, no change in status, stable for surgery.  I have reviewed the patient's chart and labs.  Questions were answered to the patient's satisfaction.     Eldred Manges

## 2023-10-02 NOTE — Progress Notes (Signed)
Pt very agitated. Pt states can not get comfortable in bed. Pt continuously asking to off load on R hip,  pt advised that can not place pillow under hip per surgical restrictions. Pt states still can not feel right leg. Pt advised it is not safe for her to get out of bed to transferred to chair while leg is still numb. Pt states if she is not able to get comfortable and to get rid of numbness she will just go home tonight.

## 2023-10-02 NOTE — Progress Notes (Signed)
PT Cancellation Note  Patient Details Name: Sheri Rivera MRN: [id number] DOB: 10-24-1961   Cancelled Treatment:    Reason Eval/Treat Not Completed: Other (comment)  Will follow-up in AM for comprehensive evaluation. 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 Patient declines PT assessment at this time.    Kathlyn Sacramento, PT, DPT [facility]  Rehabilitation Services Physical Therapist Office: [phone] Website: [email or web address]  Berton Mount 10/02/2023, 5:35 PM

## 2023-10-02 NOTE — Interval H&P Note (Signed)
History and Physical Interval Note:  10/02/2023 12:31 PM  Sheri Rivera  has presented today for surgery, with the diagnosis of right hip osteoarthritis.  The various methods of treatment have been discussed with the patient and family. After consideration of risks, benefits and other options for treatment, the patient has consented to  Procedure(s) with comments: RIGHT TOTAL HIP ARTHROPLASTY ANTERIOR APPROACH (Right) - NEEDS RNFA as a surgical intervention.  The patient's history has been reviewed, patient examined, no change in status, stable for surgery.  I have reviewed the patient's chart and labs.  Questions were answered to the patient's satisfaction.     Eldred Manges

## 2023-10-02 NOTE — Anesthesia Procedure Notes (Signed)
Date/Time: 10/02/2023 1:19 PM  Performed by: Shary Decamp, CRNAPre-anesthesia Checklist: Patient identified, Emergency Drugs available, Suction available, Timeout performed and Patient being monitored Patient Re-evaluated:Patient Re-evaluated prior to induction Oxygen Delivery Method: Simple face mask

## 2023-10-02 NOTE — Discharge Instructions (Signed)

## 2023-10-02 NOTE — H&P (Signed)
TOTAL HIP ADMISSION H&P  Patient is admitted for right total hip arthroplasty.  Subjective:  Chief Complaint: right hip pain  HPI: Sheri Rivera, 62 y.o. female, has a history of pain and functional disability in the right hip(s) due to arthritis and patient has failed non-surgical conservative treatments for greater than 12 weeks to include use of assistive devices and activity modification.  Onset of symptoms was gradual starting 6 years ago with gradually worsening course since that time.The patient noted no past surgery on the right hip(s).  Patient currently rates pain in the right hip at 5 out of 10 with activity. Patient has worsening of pain with activity and weight bearing and trendelenberg gait. Patient has evidence of subchondral sclerosis and joint space narrowing by imaging studies. This condition presents safety issues increasing the risk of falls. This patient has had    .  There is no current active infection.  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   Acute respiratory failure with hypoxia (HCC) 08/03/2017   GERD (gastroesophageal reflux disease) 06/13/2016   Chronic obstructive pulmonary disease (HCC) 06/13/2016   Current tobacco use 09/05/2015   History of fracture of vertebra 02/16/2015   Diffuse pain 04/03/2014   Hemorrhage, postmenopausal 12/29/2013   Chronic obstructive pulmonary disease with acute exacerbation (HCC) 10/30/2013   COPD mixed type (HCC) 03/30/2013   Abdominal pain, epigastric 02/25/2013   Dysphagia 02/25/2013   Gas 02/25/2013   Abdominal bloating 02/25/2013   Precordial pain 02/16/2013   Multinodular goiter 10/05/2012   HPV (human papilloma virus) infection  07/21/2012   Bladder cystocele 07/21/2012   Clinical depression 11/11/2011   Allergic rhinitis 11/11/2011   URI (upper respiratory infection) 09/28/2011   Sore throat 07/18/2011   Neck swelling 06/22/2011   Ingrown right big toenail 05/23/2011   Chronic back pain 03/16/2011   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/05/2010   Shortness of breath 03/15/2010   GOITER, MULTINODULAR 07/01/2009   FIBROMYALGIA 07/01/2009   ALLERGIC RHINITIS CAUSE UNSPECIFIED 03/02/2008   Anxiety 01/23/2007   TOBACCO DEPENDENCE 01/23/2007   GASTROESOPHAGEAL REFLUX, NO ESOPHAGITIS 01/23/2007   Past Medical History:  Diagnosis Date   Anxiety 11/11/2011   Aortic atherosclerosis (HCC)    Arthritis    Asthma    Bursitis    right shoulder   Chronic back pain    Chronic neck pain    Chronic shoulder pain    COPD (chronic obstructive pulmonary disease) (HCC)    continues to smoke 1ppd   DDD (degenerative disc disease)    Depression    Fibromyalgia    GERD (gastroesophageal reflux disease)    Heart murmur    as a child per pt   Herpes    History of hiatal hernia    repaired   History of laryngeal cancer    History of radiation therapy 08/12/18- 10/03/18   Larynx, prescribed dose of 63 Gy in 28 fractions. She chose to stop treatments after 27 fractions.    Hypothyroidism    IBS (irritable bowel syndrome)    Lumbar radiculopathy    Seasonal allergies    Smoker    Thyroid nodule    multinodular thyroid goiter    Past Surgical History:  Procedure Laterality Date  CHOLECYSTECTOMY     DIRECT LARYNGOSCOPY N/A 07/11/2018   Procedure: DIRECT LARYNGOSCOPY WITH BIOPSY;  Surgeon: Drema Halon, MD;  Location: [facility];  Service: ENT;  Laterality: N/A;   HIATAL HERNIA REPAIR     TUBAL LIGATION  11/26/1981    No current facility-administered medications for this visit.   No current outpatient medications on file.   Facility-Administered Medications Ordered in Other Visits  Medication Dose Route  Frequency Provider Last Rate Last Admin   ceFAZolin (ANCEF) IVPB 2g/100 mL premix  2 g Intravenous On Call to OR Amiel Mccaffrey, [place], [place]       lactated ringers infusion   Intravenous Continuous Edgard Nation, MD 10 mL/hr at 10/02/23 1110 Continued from Pre-op at 10/02/23 1110   tranexamic acid (CYKLOKAPRON) IVPB 1,000 mg  1,000 mg Intravenous To OR Jesstin Studstill, [place], [place]       Allergies  Allergen Reactions   Clindamycin Anaphylaxis and Swelling   Cymbalta [Duloxetine Hcl] Anaphylaxis and Itching    Lips and throat swelled; stopped breathing   Levaquin [Levofloxacin In D5w] Anaphylaxis and Itching   Pregabalin Shortness Of Breath, Swelling and Anaphylaxis    Lips and throat swelled Lips and throat swelled   Shellfish Allergy Anaphylaxis    Has EPI-PEN Has EPI-PEN   Sulfa Antibiotics Swelling   Sulfamethoxazole Anaphylaxis   Codeine Anxiety    Sweating,nervous Pt states she can tolerate hydrocodone    Social History   Tobacco Use   Smoking status: Every Day    Current packs/day: 1.00    Average packs/day: 1 pack/day for 44.8 years (44.8 ttl pk-yrs)    Types: Cigarettes    Start date: 1980   Smokeless tobacco: Never   Tobacco comments:    She is smoking 1-1.5 ppd 09/27/23  Substance Use Topics   Alcohol use: No    Family History  Problem Relation Age of Onset   Lung cancer Mother    Arthritis Mother        Rheumatoid   Bursitis Mother    Asthma Mother    Heart attack Father 72       Died of MI   Obesity Father    Fibromyalgia Sister    Asthma Child      Review of Systems  All other systems reviewed and are negative.   Objective:  Physical Exam  Objective: Vital Signs: BP 116/76   Pulse 75   Ht 5\' 2"  (1.575 m)   Wt 183 lb (83 kg)   BMI 33.47 kg/m    Physical Exam Constitutional:      Appearance: She is well-developed.  HENT:     Head: Normocephalic.     Right Ear: External ear normal.     Left Ear: External ear normal. There is no impacted cerumen.   Eyes:     Pupils: Pupils are equal, round, and reactive to light.  Neck:     Thyroid: No thyromegaly.     Trachea: No tracheal deviation.  Cardiovascular:     Rate and Rhythm: Normal rate.  Pulmonary:     Effort: Pulmonary effort is normal.  Abdominal:     Palpations: Abdomen is soft.  Musculoskeletal:     Cervical back: No rigidity.  Skin:    General: Skin is warm and dry.  Neurological:     Mental Status: She is alert and oriented to person, place, and time.  Psychiatric:        Behavior: Behavior normal.  Ortho Exam patient has severe pain with internal rotation of the right hip ambulates with a Trendelenburg gait.  No hip flexion contracture.  Pulses are normal.  Sensation of foot is intact Vital signs in last 24 hours: @VSRANGES @  Labs:   Estimated body mass index is 33.65 kg/m as calculated from the following:   Height as of an earlier encounter on 10/02/23: 5\' 2"  (1.575 m).   Weight as of an earlier encounter on 10/02/23: 83.5 kg.   Imaging Review Plain radiographs demonstrate moderate degenerative joint disease of the right hip(s). The bone quality appears to be adequate for age and reported activity level.      Assessment/Plan:  End stage arthritis, right hip(s)  The patient history, physical examination, clinical judgement of the provider and imaging studies are consistent with end stage degenerative joint disease of the right hip(s) and total hip arthroplasty is deemed medically necessary. The treatment options including medical management, injection therapy, arthroscopy and arthroplasty were discussed at length. The risks and benefits of total hip arthroplasty were presented and reviewed. The risks due to aseptic loosening, infection, stiffness, dislocation/subluxation,  thromboembolic complications and other imponderables were discussed.  The patient acknowledged the explanation, agreed to proceed with the plan and consent was signed. Patient is being  admitted for inpatient treatment for surgery, pain control, PT, OT, prophylactic antibiotics, VTE prophylaxis, progressive ambulation and ADL's and discharge planning.The patient is planning to be discharged home with home health services    Patient's anticipated LOS is less than 2 midnights, meeting these requirements: - Younger than 22 - Lives within 1 hour of care - Has a competent adult at home to recover with post-op recover - NO history of  - Chronic pain requiring opiods  - Diabetes  - Coronary Artery Disease  - Heart failure  - Heart attack  - Stroke  - DVT/VTE  - Cardiac arrhythmia  - Respiratory Failure/COPD  - Renal failure  - Anemia  - Advanced Liver disease

## 2023-10-02 NOTE — Plan of Care (Signed)
  Problem: Education: Goal: Knowledge of General Education information will improve Description: Including pain rating scale, medication(s)/side effects and non-pharmacologic comfort measures Outcome: Progressing   Problem: Activity: Goal: Risk for activity intolerance will decrease Outcome: Progressing   Problem: Nutrition: Goal: Adequate nutrition will be maintained Outcome: Progressing   Problem: Coping: Goal: Level of anxiety will decrease Outcome: Progressing   Problem: Elimination: Goal: Will not experience complications related to bowel motility Outcome: Progressing   Problem: Pain Management: Goal: General experience of comfort will improve Outcome: Progressing   Problem: Education: Goal: Understanding of discharge needs will improve Outcome: Progressing   Problem: Clinical Measurements: Goal: Postoperative complications will be avoided or minimized Outcome: Progressing

## 2023-10-02 NOTE — Op Note (Signed)
Pre and postop diagnosis: Right hip primary osteoarthritis  Direct Anterior Approach  Procedure: Right total hip arthroplasty.  Surgeon: Annell Greening, MD  Assistant: Youlanda Roys, RNFA  Anesthesia: Spinal plus Exparel and Marcaine.  EBL:  Implants  mplants  CUP SECTOR GRIPTON - [id number]  Inventory Item: CUP SECTOR GRIPTON Serial no.: Model/Cat no.: [id number]  Implant name: CUP SECTOR GRIPTON - [id number] Laterality: Right Area: Hip  Manufacturer: [employer] Date of Manufacture:   Action: Implanted Number Used: 1   Device Identifier: Device Identifier TypeLavone Neri DEPUY - [id number]  Inventory Item: ELIMINATOR HOLE APEX DEPUY Serial no.: Model/Cat no.: [id number]  Implant nameWaylan Rocher APEX DEPUY - [id number] Laterality: Right Area: Hip  Manufacturer: [employer] Date of Manufacture:   Action: Implanted Number Used: 1   Device Identifier: Device Identifier TypeLavone Neri DEPUY - [id number]  Inventory Item: ELIMINATOR HOLE APEX DEPUY Serial no.: Model/Cat no.: [id number]  Implant nameWaylan Rocher APEX DEPUY - [id number] Laterality: Right Area: Hip  Manufacturer: [employer] Date of Manufacture:   Action: Implanted Number Used: 1   Device Identifier: Device Identifier Type:   STEM FEMORAL SZ STD ACTIS - [id number]  Inventory Item: STEM FEMORAL SZ STD ACTIS Serial no.: Model/Cat no.: [id number]  Implant name: STEM FEMORAL SZ STD ACTIS - [id number] Laterality: Right Area: Hip  Manufacturer: [employer] Date of Manufacture:   Action: Implanted Number Used: 1   Device Identifier: Device Identifier Type:   HEAD FEMORAL 32 CERAMIC - [id number]  Inventory Item: HEAD FEMORAL 32 CERAMIC Serial no.: Model/Cat no.: [id number]  Implant name: HEAD FEMORAL 32 CERAMIC - [id number] Laterality: Right Area: Hip  Manufacturer: [employer] Date of Manufacture:   Action: Implanted Number  Used: 1   Device Identifier: Device Identifier Type:   Procedure after standard prepping draping with the spinal anesthesia Foley catheter placement Hana boots Hana table C arm was brought in for visualization then patient was draped DuraPrep was used usual total hip sheets drapes a large shower curtain Betadine Steri-Drape was applied after sterile skin marker and timeout procedure completed.  Incision was made 2 fingerbreadths lateral to inferior to the ASIS obliquely to the trochanter.  Fascia was nicked extended in line with the skin incision and then dull Carobel was placed over the anterior capsule transverse bleeders were easily visualized in the subtendinous fat layer and coagulated.  There were weak recoagulated at the end of the case as well.  Anterior capsule was opened there was a gush of synovial fluid and some localized synovitis.  Neck was cut under C-arm visualization 1 fingerbreadth above the lesser trochanter head was removed with a corkscrew and had eburnated bone polished on both acetabular side and the weightbearing portion of the head had raw polished bone.  Sequential reaming of the acetabulum progressing up to 49 size for 50 Placed under C arm for appropriate cup flexion and abduction secured apex of eliminator placed cup with solid would move in the neutral liner was placed.  No offset was needed since patient had minimal offset bilaterally.  Hydraulic hook applied external rotation 120 leg was taken down and under and then femur was prepared.  Cookie cutter large trochanteric clamp medial retractor medial in preparation a timeout was performed.  After first 3 broaches like this break taken up serum was brought in we checked both AP and frog-leg showing it was in good position directly down the  canal and then progressed up to size 6 stem DePuy Actis.  Excellent fit and fill calcar reamer was used and patient look like she had been length and 2 mm.  Calcar length was 9 or 10 mm.   Appropriate acetabular reaming had been performed and a +1 mm ball ceramic was selected impacted hip was reduced.  External rotation 90 halfway down to the floor good stability trace shock.  Final spot pictures were taken for documentation copious irrigation recoagulation of the transverse bleeder artery.  Operative field was dry V-Loc closure deep fascia Vicryl subtenons tissue skin staple closure postop dressing with Aquacel dressing application.  ABD tape and transferred to care room.  Foley catheter was removed in operating room.

## 2023-10-02 NOTE — Anesthesia Procedure Notes (Signed)
Spinal  Patient location during procedure: OR Start time: 10/02/2023 1:21 PM End time: 10/02/2023 1:26 PM Reason for block: surgical anesthesia Staffing Performed: anesthesiologist  Anesthesiologist: Lowella Curb, MD Performed by: Lowella Curb, MD Authorized by: Lowella Curb, MD   Preanesthetic Checklist Completed: patient identified, IV checked, site marked, risks and benefits discussed, surgical consent, monitors and equipment checked, pre-op evaluation and timeout performed Spinal Block Patient position: sitting Prep: DuraPrep Patient monitoring: heart rate, cardiac monitor, continuous pulse ox and blood pressure Approach: midline Location: L3-4 Injection technique: single-shot Needle Needle type: Quincke  Needle gauge: 22 G Needle length: 9 cm Assessment Sensory level: T4 Events: CSF return

## 2023-10-02 NOTE — Anesthesia Postprocedure Evaluation (Signed)
Anesthesia Post Note  Patient: Sheri Rivera  Procedure(s) Performed: RIGHT TOTAL HIP ARTHROPLASTY ANTERIOR APPROACH (Right: Hip)     Patient location during evaluation: PACU Anesthesia Type: Spinal Level of consciousness: awake and alert Pain management: pain level controlled Vital Signs Assessment: post-procedure vital signs reviewed and stable Respiratory status: spontaneous breathing, nonlabored ventilation and respiratory function stable Cardiovascular status: blood pressure returned to baseline and stable Postop Assessment: no apparent nausea or vomiting Anesthetic complications: no   No notable events documented.  Last Vitals:  Vitals:   10/02/23 1630 10/02/23 1659  BP: 101/65 99/60  Pulse: 63   Resp: 12 18  Temp: 36.7 C   SpO2: 93% 90%    Last Pain:  Vitals:   10/02/23 1530  TempSrc:   PainSc: 10-Worst pain ever   Pain Goal: Patients Stated Pain Goal: 3 (10/02/23 1056)  LLE Motor Response: Responds to commands (10/02/23 1711) LLE Sensation: Full sensation (10/02/23 1711) RLE Motor Response: No movement due to regional block (10/02/23 1711) RLE Sensation: Decreased (10/02/23 1711) L Sensory Level: S1-Sole of foot, small toes (10/02/23 1711) R Sensory Level: L3-Anterior knee, lower leg (10/02/23 1711)    Lowella Curb

## 2023-10-02 NOTE — H&P (Signed)
Requested by: Dois Davenport, MD [address] [place],  [place] [address] PCP: Dois Davenport, MD     Assessment & Plan: Visit Diagnoses:  1. Unilateral primary osteoarthritis, right hip       Plan: Right hip x-rays show bone-on-bone changes with no joint space.  Opposite left hip shows preservation of the joint space.  Some flattening of the head on the right side with subchondral sclerosis and subchondral cyst formation with marginal osteophytes.   Plan right total hip arthroplasty direct anterior approach.  We again discussed postoperative pain being more difficult since she is already taking relatively moderate to high dose of narcotic medication.  I reviewed with her MRI of her lumbar spine which shows showed mild and at most only moderate foraminal narrowing.  She does certainly have significant arthritis in her right hip.  Discussed total hip arthroplasty direct anterior approach with spinal anesthesia with postoperative Exparel Marcaine normal overnight stay.  Questions were elicited and answered she understands request to proceed.  Risks associated with anesthesia discussed risks of fracture, dislocation component loosening, infection, need for revision, leg length discrepancy all discussed in detail.   Follow-Up Instructions: No follow-ups on file.    Orders:  No orders of the defined types were placed in this encounter.   No orders of the defined types were placed in this encounter.        Procedures: No procedures performed     Clinical Data: No additional findings.     Subjective:    Chief Complaint  Patient presents with   Right Hip - Pain, Follow-up      HPI 62 year old female returns with severe right hip pain.  She got great temporary relief with right hip injection done with cortisone back in August.  After a week or 2 her pain came back she has bone-on-bone changes in her hip and states she is ready proceed with scheduling total of  arthroplasty.  We have added on a walker with limited weightbearing due to her pain.  Additionally she has had problems with depression, goiter, chronic pain management With hydrocodone 7.5/325 120 tablets monthly.  We have discussed last visit and also again this visit in detail how chronic medication for pain makes postoperative pain management more difficult.  She has been treated with prednisone taper pack nonweightbearing use of rolling walker as well as a cane in the past. Review of Systems positive history of fibromyalgia, anxiety, chronic back pain.  Lumbar MRI 04/24/2022 showed mild degenerative changes L2-3 and L3-4.  Small left foraminal protrusion with moderate foraminal narrowing L4-5 unchanged from 2019 MRI.  No severe compression present on her lumbar MRI.     Objective: Vital Signs: BP 116/76   Pulse 75   Ht 5\' 2"  (1.575 m)   Wt 183 lb (83 kg)   BMI 33.47 kg/m    Physical Exam Constitutional:      Appearance: She is well-developed.  HENT:     Head: Normocephalic.     Right Ear: External ear normal.     Left Ear: External ear normal. There is no impacted cerumen.  Eyes:     Pupils: Pupils are equal, round, and reactive to light.  Neck:     Thyroid: No thyromegaly.     Trachea: No tracheal deviation.  Cardiovascular:     Rate and Rhythm: Normal rate.  Pulmonary:     Effort: Pulmonary effort is normal.  Abdominal:     Palpations: Abdomen  is soft.  Musculoskeletal:     Cervical back: No rigidity.  Skin:    General: Skin is warm and dry.  Neurological:     Mental Status: She is alert and oriented to person, place, and time.  Psychiatric:        Behavior: Behavior normal.        Ortho Exam patient has severe pain with internal rotation of the right hip ambulates with a Trendelenburg gait.  No hip flexion contracture.  Pulses are normal.  Sensation of foot is intact.   Specialty Comments:  MRI LUMBAR SPINE WITHOUT CONTRAST   TECHNIQUE: Multiplanar,  multisequence MR imaging of the lumbar spine was performed. No intravenous contrast was administered.   COMPARISON:  Lumbar MRI 04/15/2018   FINDINGS: Segmentation:  5 lumbar vertebra   Alignment:  Normal   Vertebrae: Negative for acute fracture or mass. Chronic fracture of L3 unchanged from the prior study   Conus medullaris and cauda equina: Conus extends to the L2 level. Conus and cauda equina appear normal.   Paraspinal and other soft tissues: Negative for paraspinous mass or adenopathy   Disc levels:   L1-2: Negative   L2-3: Mild disc degeneration with disc space narrowing and mild spurring. Negative for stenosis   L3-4: Mild disc bulging and mild facet degeneration. Mild narrowing of the central canal without significant stenosis   L4-5: Disc degeneration with disc space narrowing and disc bulging. Left foraminal disc protrusion similar to the prior study. Moderate left foraminal narrowing. Bilateral facet degeneration. Moderate central canal stenosis with mild progression from the prior study.   L5-S1: Moderate disc degeneration with disc space narrowing and endplate spurring. Moderate right foraminal narrowing due to disc and osteophyte. No change from the prior study.   IMPRESSION: Mild degenerative change L2-3 and L3-4 without significant stenosis   Left foraminal disc protrusion L4-5 with moderate left foraminal narrowing unchanged from the prior study. Moderate central canal stenosis with mild progression from 2019   Right foraminal encroachment L5-S1 due to disc and osteophyte complex, unchanged     Electronically Signed   By: Marlan Palau M.D.   On: 04/24/2022 10:58   Imaging: No results found.     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   Acute respiratory failure with hypoxia (HCC) 08/03/2017   GERD (gastroesophageal reflux disease) 06/13/2016   Chronic obstructive pulmonary disease (HCC) 06/13/2016   Current tobacco use 09/05/2015   History of fracture of vertebra 02/16/2015   Diffuse pain 04/03/2014   Hemorrhage, postmenopausal 12/29/2013   Chronic obstructive pulmonary disease with acute exacerbation (HCC) 10/30/2013   COPD mixed type (HCC) 03/30/2013   Abdominal pain, epigastric 02/25/2013   Dysphagia 02/25/2013   Gas 02/25/2013   Abdominal bloating 02/25/2013   Precordial pain 02/16/2013   Multinodular goiter 10/05/2012   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   Bladder cystocele 07/21/2012   Clinical depression 11/11/2011   Allergic rhinitis 11/11/2011   URI (upper respiratory infection) 09/28/2011   Sore throat 07/18/2011   Neck swelling 06/22/2011   Ingrown right big toenail 05/23/2011   Chronic back pain 03/16/2011   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/05/2010   Shortness of  breath 03/15/2010   GOITER, MULTINODULAR 07/01/2009   FIBROMYALGIA 07/01/2009   ALLERGIC RHINITIS CAUSE UNSPECIFIED 03/02/2008   Anxiety 01/23/2007   TOBACCO DEPENDENCE 01/23/2007   GASTROESOPHAGEAL REFLUX, NO ESOPHAGITIS 01/23/2007        Past Medical History:  Diagnosis Date   Anxiety 11/11/2011   Arthritis     Asthma     Bursitis      right shoulder   Chronic back pain     Chronic neck pain     Chronic shoulder pain     COPD (chronic obstructive pulmonary disease) (HCC)      continues to smoke 1ppd   DDD (degenerative disc disease)     Depression     Fibromyalgia     GERD (gastroesophageal reflux disease)     Heart murmur      as a child per pt   Herpes     History of radiation therapy 08/12/18- 10/03/18    Larynx, prescribed dose of 63 Gy in 28 fractions. She chose to stop treatments after 27 fractions.    IBS  (irritable bowel syndrome)     Lumbar radiculopathy     Seasonal allergies     Smoker     Thyroid nodule      goiters             Family History  Problem Relation Age of Onset   Lung cancer Mother     Arthritis Mother          Rheumatoid   Bursitis Mother     Asthma Mother     Heart attack Father 105        Died of MI   Obesity Father     Fibromyalgia Sister     Asthma Child               Past Surgical History:  Procedure Laterality Date   CHOLECYSTECTOMY       DIRECT LARYNGOSCOPY N/A 07/11/2018    Procedure: DIRECT LARYNGOSCOPY WITH BIOPSY;  Surgeon: Drema Halon, MD;  Location: [facility];  Service: ENT;  Laterality: N/A;   HERNIA REPAIR        Hiatal    HIATAL HERNIA REPAIR       TUBAL LIGATION   1983        Social History         Occupational History      Employer: [employer]  Tobacco Use   Smoking status: Every Day      Current packs/day: 1.00      Average packs/day: 1 pack/day for 44.8 years (44.8 ttl pk-yrs)      Types: Cigarettes      Start date: 1980   Smokeless tobacco: Never   Tobacco comments:      She had quit until COVID-19, She is smoking 4-5 cigarettes daily, 07/24/19  Vaping Use   Vaping status: Never Used  Substance and Sexual Activity   Alcohol use: No   Drug use: No   Sexual activity: Not on file

## 2023-10-02 NOTE — H&P (View-Only) (Signed)
TOTAL HIP ADMISSION H&P  Patient is admitted for right total hip arthroplasty.  Subjective:  Chief Complaint: right hip pain  HPI: Sheri Rivera, 62 y.o. female, has a history of pain and functional disability in the right hip(s) due to arthritis and patient has failed non-surgical conservative treatments for greater than 12 weeks to include use of assistive devices and activity modification.  Onset of symptoms was gradual starting 6 years ago with gradually worsening course since that time.The patient noted no past surgery on the right hip(s).  Patient currently rates pain in the right hip at 5 out of 10 with activity. Patient has worsening of pain with activity and weight bearing and trendelenberg gait. Patient has evidence of subchondral sclerosis and joint space narrowing by imaging studies. This condition presents safety issues increasing the risk of falls. This patient has had    .  There is no current active infection.  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   Acute respiratory failure with hypoxia (HCC) 08/03/2017   GERD (gastroesophageal reflux disease) 06/13/2016   Chronic obstructive pulmonary disease (HCC) 06/13/2016   Current tobacco use 09/05/2015   History of fracture of vertebra 02/16/2015   Diffuse pain 04/03/2014   Hemorrhage, postmenopausal 12/29/2013   Chronic obstructive pulmonary disease with acute exacerbation (HCC) 10/30/2013   COPD mixed type (HCC) 03/30/2013   Abdominal pain, epigastric 02/25/2013   Dysphagia 02/25/2013   Gas 02/25/2013   Abdominal bloating 02/25/2013   Precordial pain 02/16/2013   Multinodular goiter 10/05/2012   HPV (human papilloma virus) infection  07/21/2012   Bladder cystocele 07/21/2012   Clinical depression 11/11/2011   Allergic rhinitis 11/11/2011   URI (upper respiratory infection) 09/28/2011   Sore throat 07/18/2011   Neck swelling 06/22/2011   Ingrown right big toenail 05/23/2011   Chronic back pain 03/16/2011   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/05/2010   Shortness of breath 03/15/2010   GOITER, MULTINODULAR 07/01/2009   FIBROMYALGIA 07/01/2009   ALLERGIC RHINITIS CAUSE UNSPECIFIED 03/02/2008   Anxiety 01/23/2007   TOBACCO DEPENDENCE 01/23/2007   GASTROESOPHAGEAL REFLUX, NO ESOPHAGITIS 01/23/2007   Past Medical History:  Diagnosis Date   Anxiety 11/11/2011   Aortic atherosclerosis (HCC)    Arthritis    Asthma    Bursitis    right shoulder   Chronic back pain    Chronic neck pain    Chronic shoulder pain    COPD (chronic obstructive pulmonary disease) (HCC)    continues to smoke 1ppd   DDD (degenerative disc disease)    Depression    Fibromyalgia    GERD (gastroesophageal reflux disease)    Heart murmur    as a child per pt   Herpes    History of hiatal hernia    repaired   History of laryngeal cancer    History of radiation therapy 08/12/18- 10/03/18   Larynx, prescribed dose of 63 Gy in 28 fractions. She chose to stop treatments after 27 fractions.    Hypothyroidism    IBS (irritable bowel syndrome)    Lumbar radiculopathy    Seasonal allergies    Smoker    Thyroid nodule    multinodular thyroid goiter    Past Surgical History:  Procedure Laterality Date  CHOLECYSTECTOMY     DIRECT LARYNGOSCOPY N/A 07/11/2018   Procedure: DIRECT LARYNGOSCOPY WITH BIOPSY;  Surgeon: Drema Halon, MD;  Location: [facility];  Service: ENT;  Laterality: N/A;   HIATAL HERNIA REPAIR     TUBAL LIGATION  11/26/1981    No current facility-administered medications for this visit.   No current outpatient medications on file.   Facility-Administered Medications Ordered in Other Visits  Medication Dose Route  Frequency Provider Last Rate Last Admin   ceFAZolin (ANCEF) IVPB 2g/100 mL premix  2 g Intravenous On Call to OR Amiel Mccaffrey, [place], [place]       lactated ringers infusion   Intravenous Continuous Edgard Nation, MD 10 mL/hr at 10/02/23 1110 Continued from Pre-op at 10/02/23 1110   tranexamic acid (CYKLOKAPRON) IVPB 1,000 mg  1,000 mg Intravenous To OR Jesstin Studstill, [place], [place]       Allergies  Allergen Reactions   Clindamycin Anaphylaxis and Swelling   Cymbalta [Duloxetine Hcl] Anaphylaxis and Itching    Lips and throat swelled; stopped breathing   Levaquin [Levofloxacin In D5w] Anaphylaxis and Itching   Pregabalin Shortness Of Breath, Swelling and Anaphylaxis    Lips and throat swelled Lips and throat swelled   Shellfish Allergy Anaphylaxis    Has EPI-PEN Has EPI-PEN   Sulfa Antibiotics Swelling   Sulfamethoxazole Anaphylaxis   Codeine Anxiety    Sweating,nervous Pt states she can tolerate hydrocodone    Social History   Tobacco Use   Smoking status: Every Day    Current packs/day: 1.00    Average packs/day: 1 pack/day for 44.8 years (44.8 ttl pk-yrs)    Types: Cigarettes    Start date: 1980   Smokeless tobacco: Never   Tobacco comments:    She is smoking 1-1.5 ppd 09/27/23  Substance Use Topics   Alcohol use: No    Family History  Problem Relation Age of Onset   Lung cancer Mother    Arthritis Mother        Rheumatoid   Bursitis Mother    Asthma Mother    Heart attack Father 72       Died of MI   Obesity Father    Fibromyalgia Sister    Asthma Child      Review of Systems  All other systems reviewed and are negative.   Objective:  Physical Exam  Objective: Vital Signs: BP 116/76   Pulse 75   Ht 5\' 2"  (1.575 m)   Wt 183 lb (83 kg)   BMI 33.47 kg/m    Physical Exam Constitutional:      Appearance: She is well-developed.  HENT:     Head: Normocephalic.     Right Ear: External ear normal.     Left Ear: External ear normal. There is no impacted cerumen.   Eyes:     Pupils: Pupils are equal, round, and reactive to light.  Neck:     Thyroid: No thyromegaly.     Trachea: No tracheal deviation.  Cardiovascular:     Rate and Rhythm: Normal rate.  Pulmonary:     Effort: Pulmonary effort is normal.  Abdominal:     Palpations: Abdomen is soft.  Musculoskeletal:     Cervical back: No rigidity.  Skin:    General: Skin is warm and dry.  Neurological:     Mental Status: She is alert and oriented to person, place, and time.  Psychiatric:        Behavior: Behavior normal.  Ortho Exam patient has severe pain with internal rotation of the right hip ambulates with a Trendelenburg gait.  No hip flexion contracture.  Pulses are normal.  Sensation of foot is intact Vital signs in last 24 hours: @VSRANGES @  Labs:   Estimated body mass index is 33.65 kg/m as calculated from the following:   Height as of an earlier encounter on 10/02/23: 5\' 2"  (1.575 m).   Weight as of an earlier encounter on 10/02/23: 83.5 kg.   Imaging Review Plain radiographs demonstrate moderate degenerative joint disease of the right hip(s). The bone quality appears to be adequate for age and reported activity level.      Assessment/Plan:  End stage arthritis, right hip(s)  The patient history, physical examination, clinical judgement of the provider and imaging studies are consistent with end stage degenerative joint disease of the right hip(s) and total hip arthroplasty is deemed medically necessary. The treatment options including medical management, injection therapy, arthroscopy and arthroplasty were discussed at length. The risks and benefits of total hip arthroplasty were presented and reviewed. The risks due to aseptic loosening, infection, stiffness, dislocation/subluxation,  thromboembolic complications and other imponderables were discussed.  The patient acknowledged the explanation, agreed to proceed with the plan and consent was signed. Patient is being  admitted for inpatient treatment for surgery, pain control, PT, OT, prophylactic antibiotics, VTE prophylaxis, progressive ambulation and ADL's and discharge planning.The patient is planning to be discharged home with home health services    Patient's anticipated LOS is less than 2 midnights, meeting these requirements: - Younger than 22 - Lives within 1 hour of care - Has a competent adult at home to recover with post-op recover - NO history of  - Chronic pain requiring opiods  - Diabetes  - Coronary Artery Disease  - Heart failure  - Heart attack  - Stroke  - DVT/VTE  - Cardiac arrhythmia  - Respiratory Failure/COPD  - Renal failure  - Anemia  - Advanced Liver disease

## 2023-10-03 ENCOUNTER — Encounter (HOSPITAL_COMMUNITY): Payer: Self-pay | Admitting: Orthopaedic Surgery

## 2023-10-03 DIAGNOSIS — M1611 Unilateral primary osteoarthritis, right hip: Secondary | ICD-10-CM | POA: Diagnosis not present

## 2023-10-03 LAB — CBC
HCT: 36.4 % (ref 36.0–46.0)
Hemoglobin: 12.1 g/dL (ref 12.0–15.0)
MCH: 32.3 pg (ref 26.0–34.0)
MCHC: 33.2 g/dL (ref 30.0–36.0)
MCV: 97.1 fL (ref 80.0–100.0)
Platelets: 266 10*3/uL (ref 150–400)
RBC: 3.75 MIL/uL — ABNORMAL LOW (ref 3.87–5.11)
RDW: 14.2 % (ref 11.5–15.5)
WBC: 14.2 10*3/uL — ABNORMAL HIGH (ref 4.0–10.5)
nRBC: 0 % (ref 0.0–0.2)

## 2023-10-03 MED ORDER — METHOCARBAMOL 500 MG PO TABS: 500.0000 mg | ORAL_TABLET | Freq: Four times a day (QID) | ORAL | 0 refills | Status: AC | PRN

## 2023-10-03 MED ORDER — OXYCODONE-ACETAMINOPHEN 5-325 MG PO TABS: 1.0000 | ORAL_TABLET | Freq: Four times a day (QID) | ORAL | 0 refills | Status: DC | PRN

## 2023-10-03 MED ORDER — GUAIFENESIN ER 600 MG PO TB12
600.0000 mg | ORAL_TABLET | Freq: Two times a day (BID) | ORAL | Status: DC
  Administered 2023-10-03: 600 mg via ORAL
  Filled 2023-10-03: qty 1

## 2023-10-03 NOTE — Evaluation (Addendum)
Occupational Therapy Evaluation Patient Details Name: Sheri Rivera MRN: [id number] DOB: 1961-01-29 Today's Date: 10/03/2023   History of Present Illness Pt is a 62 y/o female presenting on 11/6 due to R hip OA.  S/P elective R total hip arthroplasty, direct anterior approach 11/6. PMH includes: anxiety, arthritis, COPD, DDD, fibromyalgia, herpes, IBS.   Clinical Impression   PTA patient independent and driving. Admitted for above and present with problem list below.  She requires min assist to min guard for transfers, up to min assist for ADLs using AE given cueing for safety.  She reports she will have support from daughters initially at dc, has all DME needed. Pt return demonstrated use of AE for LB dressing with supervision today. Based on performance today, will follow acutely but anticipate no further needs after dc home.        If plan is discharge home, recommend the following: A little help with walking and/or transfers;A little help with bathing/dressing/bathroom;Assistance with cooking/housework;Assist for transportation;Help with stairs or ramp for entrance    Functional Status Assessment  Patient has had a recent decline in their functional status and demonstrates the ability to make significant improvements in function in a reasonable and predictable amount of time.  Equipment Recommendations  None recommended by OT    Recommendations for Other Services       Precautions / Restrictions Precautions Precautions: Fall Precaution Comments: direct anterior total hip, no precautions per MD Restrictions Weight Bearing Restrictions: No RLE Weight Bearing: Weight bearing as tolerated      Mobility Bed Mobility               General bed mobility comments: OOB upon entry    Transfers Overall transfer level: Needs assistance Equipment used: Rolling walker (2 wheels) Transfers: Sit to/from Stand Sit to Stand: Min assist, Contact guard assist            General transfer comment: min assist fading to min guard, cueing for hand placement and safety      Balance Overall balance assessment: Needs assistance Sitting-balance support: No upper extremity supported, Feet supported Sitting balance-Leahy Scale: Good     Standing balance support: Bilateral upper extremity supported, No upper extremity supported, During functional activity, Reliant on assistive device for balance Standing balance-Leahy Scale: Fair Standing balance comment: relies on RW dynamically but able to engage in ADLs with 0-2 hand support and min guard                           ADL either performed or assessed with clinical judgement   ADL Overall ADL's : Needs assistance/impaired     Grooming: Contact guard assist;Wash/dry hands;Standing           Upper Body Dressing : Set up;Sitting   Lower Body Dressing: Minimal assistance;Sit to/from stand;Cueing for compensatory techniques;With adaptive equipment   Toilet Transfer: Contact guard assist;Ambulation;Rolling walker (2 wheels)   Toileting- Clothing Manipulation and Hygiene: Sit to/from stand;Minimal assistance       Functional mobility during ADLs: Contact guard assist;Minimal assistance;Rolling walker (2 wheels);Cueing for sequencing;Cueing for safety       Vision   Vision Assessment?: No apparent visual deficits     Perception         Praxis         Pertinent Vitals/Pain Pain Assessment Pain Assessment: Faces Faces Pain Scale: Hurts little more Pain Location: R hip Pain Descriptors / Indicators: Discomfort, Operative site guarding Pain  Intervention(s): Monitored during session, Limited activity within patient's tolerance, Repositioned     Extremity/Trunk Assessment Upper Extremity Assessment Upper Extremity Assessment: Overall WFL for tasks assessed   Lower Extremity Assessment Lower Extremity Assessment: Defer to PT evaluation       Communication  Communication Communication: No apparent difficulties   Cognition Arousal: Alert Behavior During Therapy: WFL for tasks assessed/performed Overall Cognitive Status: Within Functional Limits for tasks assessed                                       General Comments       Exercises     Shoulder Instructions      Home Living Family/patient expects to be discharged to:: Private residence Living Arrangements: Alone Available Help at Discharge: Family;Available PRN/intermittently Type of Home: Apartment Home Access: Stairs to enter Entrance Stairs-Number of Steps: 1+1 Entrance Stairs-Rails: None Home Layout: One level     Bathroom Shower/Tub: Chief Strategy Officer: Handicapped height Bathroom Accessibility: Yes   Home Equipment: Grab bars - toilet;Grab bars - tub/shower;Rolling Walker (2 wheels);Cane - single point;Shower seat;Rollator (4 wheels);Adaptive equipment Adaptive Equipment: Reacher;Sock aid;Long-handled shoe horn;Long-handled sponge Additional Comments: has a shower chair on porch to sit on and turn to get up step if needed      Prior Functioning/Environment Prior Level of Function : Independent/Modified Independent;Driving             Mobility Comments: using SPC inside, Rollator outside. was driving. was grocery shopping and walking dogs until ~ 6months, in last 3 months unable to. last 3 montsh delivered groceries. and daughter doing laundry an dhome making. ADLs Comments: ind with ADLs, uses shower seat. cooked in crockpot and freeze meals. daughter would help with clean up.        OT Problem List: Decreased strength;Decreased activity tolerance;Impaired balance (sitting and/or standing);Decreased safety awareness;Decreased knowledge of use of DME or AE;Decreased knowledge of precautions;Pain      OT Treatment/Interventions: Self-care/ADL training;Energy conservation;DME and/or AE instruction;Therapeutic activities;Cognitive  remediation/compensation;Balance training;Patient/family education    OT Goals(Current goals can be found in the care plan section) Acute Rehab OT Goals Patient Stated Goal: home OT Goal Formulation: With patient Time For Goal Achievement: 10/17/23 Potential to Achieve Goals: Good  OT Frequency: Min 1X/week    Co-evaluation              AM-PAC OT "6 Clicks" Daily Activity     Outcome Measure Help from another person eating meals?: None Help from another person taking care of personal grooming?: A Little Help from another person toileting, which includes using toliet, bedpan, or urinal?: A Little Help from another person bathing (including washing, rinsing, drying)?: A Little Help from another person to put on and taking off regular upper body clothing?: A Little Help from another person to put on and taking off regular lower body clothing?: A Little 6 Click Score: 19   End of Session Equipment Utilized During Treatment: Rolling walker (2 wheels) Nurse Communication: Mobility status  Activity Tolerance: Patient tolerated treatment well Patient left: in chair;with call bell/phone within reach;with chair alarm set  OT Visit Diagnosis: Other abnormalities of gait and mobility (R26.89);Muscle weakness (generalized) (M62.81);Pain Pain - Right/Left: Right Pain - part of body: Hip;Leg                Time: 8119-1478 OT Time Calculation (min): 25 min Charges:  OT  General Charges $OT Visit: 1 Visit OT Evaluation $OT Eval Moderate Complexity: 1 Mod OT Treatments $Self Care/Home Management : 8-22 mins  Barry Brunner, OT Acute Rehabilitation Services Office [phone]   Chancy Milroy 10/03/2023, 1:33 PM

## 2023-10-03 NOTE — Plan of Care (Signed)
  Problem: Clinical Measurements: Goal: Will remain free from infection Outcome: Progressing   Problem: Activity: Goal: Risk for activity intolerance will decrease Outcome: Progressing   Problem: Safety: Goal: Ability to remain free from injury will improve Outcome: Progressing   

## 2023-10-03 NOTE — Progress Notes (Signed)
Pt discharged home with daughter in stable condition. Discharge instructions given. Scripts sent to pharmacy of choice. Pt verbalized understanding and no immediate questions or concerns at this time. DC'd from unit via wheelchair.

## 2023-10-03 NOTE — Plan of Care (Signed)
  Problem: Education: Goal: Knowledge of General Education information will improve Description: Including pain rating scale, medication(s)/side effects and non-pharmacologic comfort measures Outcome: Adequate for Discharge   Problem: Health Behavior/Discharge Planning: Goal: Ability to manage health-related needs will improve Outcome: Adequate for Discharge   Problem: Clinical Measurements: Goal: Ability to maintain clinical measurements within normal limits will improve Outcome: Adequate for Discharge Goal: Will remain free from infection Outcome: Adequate for Discharge Goal: Diagnostic test results will improve Outcome: Adequate for Discharge Goal: Respiratory complications will improve Outcome: Adequate for Discharge Goal: Cardiovascular complication will be avoided Outcome: Adequate for Discharge   Problem: Activity: Goal: Risk for activity intolerance will decrease Outcome: Adequate for Discharge   Problem: Nutrition: Goal: Adequate nutrition will be maintained Outcome: Adequate for Discharge   Problem: Coping: Goal: Level of anxiety will decrease Outcome: Adequate for Discharge   Problem: Elimination: Goal: Will not experience complications related to bowel motility Outcome: Adequate for Discharge Goal: Will not experience complications related to urinary retention Outcome: Adequate for Discharge   Problem: Pain Management: Goal: General experience of comfort will improve Outcome: Adequate for Discharge   Problem: Safety: Goal: Ability to remain free from injury will improve Outcome: Adequate for Discharge   Problem: Skin Integrity: Goal: Risk for impaired skin integrity will decrease Outcome: Adequate for Discharge   Problem: Education: Goal: Knowledge of the prescribed therapeutic regimen will improve Outcome: Adequate for Discharge Goal: Understanding of discharge needs will improve Outcome: Adequate for Discharge Goal: Individualized Educational  Video(s) Outcome: Adequate for Discharge   Problem: Activity: Goal: Ability to avoid complications of mobility impairment will improve Outcome: Adequate for Discharge Goal: Ability to tolerate increased activity will improve Outcome: Adequate for Discharge   Problem: Clinical Measurements: Goal: Postoperative complications will be avoided or minimized Outcome: Adequate for Discharge   Problem: Pain Management: Goal: Pain level will decrease with appropriate interventions Outcome: Adequate for Discharge   Problem: Skin Integrity: Goal: Will show signs of wound healing Outcome: Adequate for Discharge

## 2023-10-03 NOTE — Progress Notes (Signed)
Patient ID: Sheri Rivera, female   DOB: 1961/01/28, 62 y.o.   MRN: [id number] Did well with PT.  " I am ready to go home. "

## 2023-10-03 NOTE — Progress Notes (Signed)
Physical Therapy Treatment Patient Details Name: Sheri Rivera MRN: [id number] DOB: Jan 10, 1961 Today's Date: 10/03/2023   History of Present Illness Pt is a 62 y/o female presenting on 11/6 due to R hip OA.  S/P elective R total hip arthroplasty, direct anterior approach 11/6. PMH includes: anxiety, arthritis, COPD, DDD, fibromyalgia, herpes, IBS.    PT Comments  Pt resting in bed and uncomfortable reporting recently medicated but not effective yet. Pt agreeable to mobilize with therapy with goal to progress for discharge home. Pt educated on use of gait belt to assist Rt LE off/on EOB and completed supine>sit with CGA only. Pt able to scoot to edge and position self for safe transfers. CGA for sit<>stand with good carryover for hand placement from prior visit. Pt recalling to dorsiflex Rt foot to improve Rt foot advancement and amb ~100'. Pt instructed on curb negotiation to simulate home entrance with RW for support using reverse step up technique. No LOB and pt stable with management of RW. EOS pt completed sit>supine to return to bed for rest. Educated on safe sleeping positions and pt able to get comfortable in Lt side-lying with pillow between LE's for hip alignment. Reviewed questions regarding return home. Recommend HHPT follow up for pt to progress mobility after acute stay. Will progress as able.     If plan is discharge home, recommend the following: A little help with walking and/or transfers;A little help with bathing/dressing/bathroom;Assistance with cooking/housework;Assist for transportation;Help with stairs or ramp for entrance   Can travel by private vehicle        Equipment Recommendations  None recommended by PT    Recommendations for Other Services       Precautions / Restrictions Precautions Precautions: Fall Precaution Comments: direct anterior total hip, no precautions per MD Restrictions Weight Bearing Restrictions: No RLE Weight Bearing: Weight bearing as  tolerated     Mobility  Bed Mobility Overal bed mobility: Needs Assistance Bed Mobility: Supine to Sit, Sit to Supine     Supine to sit: Contact guard, HOB elevated Sit to supine: Contact guard assist   General bed mobility comments: cues for sequencing and use of gait belt to bring Rt LE off/onto bed. CGA for safety.    Transfers Overall transfer level: Needs assistance Equipment used: Rolling walker (2 wheels) Transfers: Sit to/from Stand Sit to Stand: Contact guard assist           General transfer comment: CGA for safety, good carryover for hand placement, pt taking extra time to power up.    Ambulation/Gait Ambulation/Gait assistance: Contact guard assist Gait Distance (Feet): 100 Feet Assistive device: Rolling walker (2 wheels) Gait Pattern/deviations: Step-to pattern, Decreased stance time - right, Decreased step length - right, Decreased stride length Gait velocity: decr     General Gait Details: pt with good recall from prior visit, Df rt foot to improve step advancement with Rt LE. pt progressing from step to pattern to more step through pattern for more noramlized gait. continues to carry Rt LE in flexed position in stance and swing phase.   Stairs Stairs: Yes Stairs assistance: Contact guard assist Stair Management: No rails, Step to pattern, Backwards, With walker Number of Stairs: 1 General stair comments: cues/demonstration for sequencing curb negotiation with RW and reverse step up technique. CGA for safety, pt able to rise up on Lt LE and step down with Rt smoothly.   Wheelchair Mobility     Tilt Bed    Modified Rankin (Stroke Patients Only)  Balance Overall balance assessment: Needs assistance Sitting-balance support: No upper extremity supported, Feet supported Sitting balance-Leahy Scale: Good     Standing balance support: Bilateral upper extremity supported, During functional activity, Reliant on assistive device for  balance Standing balance-Leahy Scale: Poor Standing balance comment: reliant on RW                            Cognition Arousal: Alert Behavior During Therapy: WFL for tasks assessed/performed Overall Cognitive Status: Within Functional Limits for tasks assessed                                          Exercises General Exercises - Lower Extremity Ankle Circles/Pumps: AROM, Both, 10 reps Quad Sets: AROM, Right, 5 reps Heel Slides: AAROM, Right, 5 reps    General Comments        Pertinent Vitals/Pain Pain Assessment Pain Assessment: Faces Faces Pain Scale: Hurts even more Pain Location: Rt hip/thigh Pain Descriptors / Indicators: Discomfort, Guarding, Operative site guarding Pain Intervention(s): Limited activity within patient's tolerance, Monitored during session, Premedicated before session, Repositioned, Ice applied    Home Living Family/patient expects to be discharged to:: Private residence Living Arrangements: Alone Available Help at Discharge: Family;Available PRN/intermittently Type of Home: Apartment Home Access: Stairs to enter Entrance Stairs-Rails: None Entrance Stairs-Number of Steps: 1+1   Home Layout: One level Home Equipment: Grab bars - toilet;Grab bars - tub/shower;Rolling Walker (2 wheels);Cane - single point;Educational psychologist (4 wheels) Additional Comments: has a shower chair on porch to sit on and turn to get up step if needed    Prior Function            PT Goals (current goals can now be found in the care plan section) Acute Rehab PT Goals Patient Stated Goal: go home PT Goal Formulation: With patient Time For Goal Achievement: 10/10/23 Potential to Achieve Goals: Good Progress towards PT goals: Progressing toward goals    Frequency    Min 1X/week      PT Plan      Co-evaluation              AM-PAC PT "6 Clicks" Mobility   Outcome Measure  Help needed turning from your back to your side  while in a flat bed without using bedrails?: A Lot Help needed moving from lying on your back to sitting on the side of a flat bed without using bedrails?: A Lot Help needed moving to and from a bed to a chair (including a wheelchair)?: A Little Help needed standing up from a chair using your arms (e.g., wheelchair or bedside chair)?: A Little Help needed to walk in hospital room?: A Little Help needed climbing 3-5 steps with a railing? : A Lot 6 Click Score: 15    End of Session Equipment Utilized During Treatment: Gait belt Activity Tolerance: Patient tolerated treatment well;Patient limited by pain Patient left: in chair;with call bell/phone within reach;with chair alarm set Nurse Communication: Mobility status PT Visit Diagnosis: Muscle weakness (generalized) (M62.81);Difficulty in walking, not elsewhere classified (R26.2);Other abnormalities of gait and mobility (R26.89);Unsteadiness on feet (R26.81);Pain Pain - Right/Left: Right Pain - part of body: Hip     Time: 0630-1601 PT Time Calculation (min) (ACUTE ONLY): 35 min  Charges:    $Gait Training: 8-22 mins $Therapeutic Activity: 8-22 mins PT General Charges $$ ACUTE  PT VISIT: 1 Visit                     Wynn Maudlin, DPT Acute Rehabilitation Services Office [phone]  10/03/23 3:58 PM

## 2023-10-03 NOTE — Evaluation (Signed)
Physical Therapy Evaluation Patient Details Name: Sheri Rivera MRN: [id number] DOB: 01-13-1961 Today's Date: 10/03/2023  History of Present Illness  Pt is a 62 y/o female presenting on 11/6 due to R hip OA.  S/P elective R total hip arthroplasty, direct anterior approach 11/6. PMH includes: anxiety, arthritis, COPD, DDD, fibromyalgia, herpes, IBS.    Clinical Impression  Sheri Rivera is a 62 y.o. female POD 1 s/p Rt THA. Patient reports mod independence with mobility using SPC and rollator at baseline with assist from daughter for ADL's. Patient is now limited by functional impairments (see PT problem list below) and requires min assist for transfers and gait with RW. Patient was able to ambulate ~!00 feet with RW and min assist. Patient instructed in exercise to facilitate ROM and circulation to manage edema. Patient will benefit from continued skilled PT interventions to address impairments and progress towards PLOF. Acute PT will follow to progress mobility and stair training in preparation for safe discharge home.         If plan is discharge home, recommend the following: A little help with walking and/or transfers;A little help with bathing/dressing/bathroom;Assistance with cooking/housework;Assist for transportation;Help with stairs or ramp for entrance   Can travel by private vehicle        Equipment Recommendations None recommended by PT  Recommendations for Other Services       Functional Status Assessment Patient has had a recent decline in their functional status and demonstrates the ability to make significant improvements in function in a reasonable and predictable amount of time.     Precautions / Restrictions Precautions Precautions: Fall Precaution Comments: direct anterior total hip, no precautions per MD Restrictions Weight Bearing Restrictions: No RLE Weight Bearing: Weight bearing as tolerated      Mobility  Bed Mobility Overal bed mobility: Needs  Assistance             General bed mobility comments: sitting EOB at start and in recliner at end of session.    Transfers Overall transfer level: Needs assistance Equipment used: Rolling walker (2 wheels) Transfers: Sit to/from Stand Sit to Stand: Min assist           General transfer comment: min assist for rise andlower with cues for hand placement to imrpove power up and control descent to sit.    Ambulation/Gait Ambulation/Gait assistance: Min assist Gait Distance (Feet): 100 Feet Assistive device: Rolling walker (2 wheels) Gait Pattern/deviations: Step-to pattern, Decreased stance time - right, Decreased step length - right, Decreased stride length Gait velocity: decr     General Gait Details: cues for step to pattern, leading with Lt LE and cues to Df Rt foot to improve step through of Rt. min assist to advance/manage RW and steady.  Stairs            Wheelchair Mobility     Tilt Bed    Modified Rankin (Stroke Patients Only)       Balance Overall balance assessment: Needs assistance Sitting-balance support: No upper extremity supported, Feet supported Sitting balance-Leahy Scale: Good     Standing balance support: Bilateral upper extremity supported, During functional activity, Reliant on assistive device for balance Standing balance-Leahy Scale: Poor Standing balance comment: reliant on RW                             Pertinent Vitals/Pain Pain Assessment Pain Assessment: Faces Faces Pain Scale: Hurts even more Pain Location: Rt  hip/thigh Pain Descriptors / Indicators: Discomfort, Guarding, Operative site guarding Pain Intervention(s): Limited activity within patient's tolerance, Monitored during session, Repositioned, Premedicated before session, Ice applied    Home Living Family/patient expects to be discharged to:: Private residence Living Arrangements: Alone Available Help at Discharge: Family;Available  PRN/intermittently Type of Home: Apartment Home Access: Stairs to enter Entrance Stairs-Rails: None Entrance Stairs-Number of Steps: 1+1   Home Layout: One level Home Equipment: Grab bars - toilet;Grab bars - tub/shower;Rolling Walker (2 wheels);Cane - single point;Shower seat;Rollator (4 wheels) Additional Comments: has a shower chair on porch to sit on and turn to get up step if needed    Prior Function Prior Level of Function : Independent/Modified Independent;Driving             Mobility Comments: using SPC inside, Rollator outside. was driving. was grocery shopping and walking dogs until ~ 6months, in last 3 months unable to. last 3 montsh delivered groceries. and daughter doing laundry an dhome making. ADLs Comments: ind with ADLs, uses shower seat. cooked in crockpot and freeze meals. daughter would help with clean up.     Extremity/Trunk Assessment        Lower Extremity Assessment RLE Deficits / Details: grossly 3/5 or less at hip and knee ext due to pain, 4/5 for Df/Pf RLE: Unable to fully assess due to pain RLE Sensation: WNL RLE Coordination: WNL;decreased gross motor (limited by pain)    Cervical / Trunk Assessment Cervical / Trunk Assessment: Back Surgery;Normal  Communication   Communication Communication: No apparent difficulties  Cognition Arousal: Alert Behavior During Therapy: WFL for tasks assessed/performed Overall Cognitive Status: Within Functional Limits for tasks assessed                                          General Comments      Exercises General Exercises - Lower Extremity Ankle Circles/Pumps: AROM, Both, 10 reps Quad Sets: AROM, Right, 5 reps Heel Slides: AAROM, Right, 5 reps   Assessment/Plan    PT Assessment Patient needs continued PT services  PT Problem List Decreased strength;Decreased range of motion;Decreased activity tolerance;Decreased balance;Decreased mobility;Decreased safety awareness;Decreased  knowledge of use of DME;Decreased knowledge of precautions;Pain       PT Treatment Interventions DME instruction;Gait training;Stair training;Functional mobility training;Therapeutic activities;Therapeutic exercise;Balance training;Neuromuscular re-education;Cognitive remediation;Patient/family education    PT Goals (Current goals can be found in the Care Plan section)  Acute Rehab PT Goals Patient Stated Goal: go home PT Goal Formulation: With patient Time For Goal Achievement: 10/10/23 Potential to Achieve Goals: Good    Frequency Min 1X/week     Co-evaluation               AM-PAC PT "6 Clicks" Mobility  Outcome Measure Help needed turning from your back to your side while in a flat bed without using bedrails?: A Lot Help needed moving from lying on your back to sitting on the side of a flat bed without using bedrails?: A Lot Help needed moving to and from a bed to a chair (including a wheelchair)?: A Little Help needed standing up from a chair using your arms (e.g., wheelchair or bedside chair)?: A Little Help needed to walk in hospital room?: A Little Help needed climbing 3-5 steps with a railing? : A Lot 6 Click Score: 15    End of Session Equipment Utilized During Treatment: Gait belt Activity Tolerance:  Patient tolerated treatment well;Patient limited by pain Patient left: in chair;with call bell/phone within reach;with chair alarm set Nurse Communication: Mobility status PT Visit Diagnosis: Muscle weakness (generalized) (M62.81);Difficulty in walking, not elsewhere classified (R26.2);Other abnormalities of gait and mobility (R26.89);Unsteadiness on feet (R26.81);Pain Pain - Right/Left: Right Pain - part of body: Hip      10/03/23 0855  PT Time Calculation  PT Start Time (ACUTE ONLY) 0851  PT Stop Time (ACUTE ONLY) 0931  PT Time Calculation (min) (ACUTE ONLY) 40 min  PT General Charges  $$ ACUTE PT VISIT 1 Visit  PT Evaluation  $PT Eval Low Complexity 1  Low  PT Treatments  $Gait Training 8-22 mins  $Therapeutic Exercise 8-22 mins      Wynn Maudlin, DPT Acute Rehabilitation Services Office [phone]  10/03/23 3:54 PM

## 2023-10-03 NOTE — Progress Notes (Addendum)
Patient ID: Sheri Rivera, female   DOB: 16-Apr-1961, 62 y.o.   MRN: [id number]   Subjective: 1 Day Post-Op Procedure(s) (LRB): RIGHT TOTAL HIP ARTHROPLASTY ANTERIOR APPROACH (Right) Patient reports pain as moderate and severe.  Pulse 60 -99 and BP normal. Eating breakfast.   Objective: Vital signs in last 24 hours: Temp:  [97.6 F (36.4 C)-98.2 F (36.8 C)] 97.6 F (36.4 C) (11/07 0843) Pulse Rate:  [60-99] 74 (11/07 0843) Resp:  [12-20] 17 (11/07 0843) BP: (97-134)/(60-90) 102/73 (11/07 0843) SpO2:  [90 %-95 %] 95 % (11/07 0843) Weight:  [83.5 kg] 83.5 kg (11/06 1042)  Intake/Output from previous day: 11/06 0701 - 11/07 0700 In: 1500 [I.V.:800; IV Piggyback:700] Out: 791 [Urine:291; Blood:500] Intake/Output this shift: No intake/output data recorded.  No results for input(s): "HGB" in the last 72 hours. No results for input(s): "WBC", "RBC", "HCT", "PLT" in the last 72 hours. No results for input(s): "NA", "K", "CL", "CO2", "BUN", "CREATININE", "GLUCOSE", "CALCIUM" in the last 72 hours. No results for input(s): "LABPT", "INR" in the last 72 hours.  PE:  hip soft , quads working.  DG HIP UNILAT WITH PELVIS 1V RIGHT  Result Date: 10/02/2023 CLINICAL DATA:  Elective surgery. EXAM: DG HIP (WITH OR WITHOUT PELVIS) 1V RIGHT COMPARISON:  08/02/2023 FINDINGS: Eight fluoroscopic spot views of the pelvis and right hip obtained in the operating room. Sequential images during hip arthroplasty. Fluoroscopy time 17.2 seconds. Dose 2.05 mGy. IMPRESSION: Intraoperative fluoroscopy during right hip arthroplasty. Electronically Signed   By: Narda Rutherford M.D.   On: 10/02/2023 16:03   DG C-Arm 1-60 Min-No Report  Result Date: 10/02/2023 Fluoroscopy was utilized by the requesting physician.  No radiographic interpretation.   DG C-Arm 1-60 Min-No Report  Result Date: 10/02/2023 Fluoroscopy was utilized by the requesting physician.  No radiographic interpretation.    Assessment/Plan: 1  Day Post-Op Procedure(s) (LRB): RIGHT TOTAL HIP ARTHROPLASTY ANTERIOR APPROACH (Right) Plan:   OOB with therapy.   Eldred Manges 10/03/2023, 8:58 AM

## 2023-10-04 NOTE — Discharge Summary (Signed)
Physician Discharge Summary  Patient ID: Sheri Rivera MRN: [id number] DOB/AGE: 1961-06-28 62 y.o.  Admit date: 10/02/2023 Discharge date: 10/03/23  Admission Diagnoses: Right hip primary osteoarthritis  Discharge Diagnoses:  Principal Problem:   S/P total right hip arthroplasty Active Problems:   Unilateral primary osteoarthritis, right hip   Discharged Condition: good  Hospital Course: Patient was admitted with right hip primary osteoarthritis bone-on-bone changes failed conservative treatment.  She underwent right total hip arthroplasty direct anterior approach under spinal anesthesia.  Postoperatively she was mobilized with physical therapy, Occupational Therapy and discharged the following day.  Consults: PT and OT  Significant Diagnostic Studies:   Treatments: Postop hemoglobin decreased mildly from 15.7-12.1.  All other labs were unremarkable.  Discharge Exam: Blood pressure 104/65, pulse 83, temperature 98.2 F (36.8 C), temperature source Oral, resp. rate 17, height 5\' 2"  (1.575 m), weight 83.5 kg, SpO2 (!) 76%. Physical exam: Dressing was dry at time of discharge leg lengths are equal she is amatory with a walker with physical therapy up and down the hall.  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 What changed: Another medication with the same name was removed. Continue taking this medication, and follow the directions you see here.   albuterol 108 (90 Base) MCG/ACT inhaler Commonly known as: VENTOLIN HFA Inhale 2 puffs into the lungs every 6 (six) hours as needed for shortness of breath. Shortness of breath What changed: Another medication with the same name was removed. Continue taking this medication, and follow the directions you see here.   fluticasone 50 MCG/ACT nasal spray Commonly known as: FLONASE Place 1 spray into both nostrils daily.   HYDROcodone-acetaminophen 7.5-325 MG tablet Commonly known as: Norco Take 1 tablet by mouth every 6 (six) hours as needed for moderate pain (pain score 4-6).   levothyroxine 25 MCG tablet Commonly known as: SYNTHROID Take 25 mcg by mouth daily before breakfast.   LORazepam 0.5 MG tablet Commonly known as: ATIVAN Take 1 tab PRN extreme anxiety, not to exceed once daily. Use sparingly. Future Rx only at the discretion of the primary care provider.   methocarbamol 500 MG tablet Commonly known as: ROBAXIN Take 1 tablet (500 mg total) by mouth every 6 (six) hours as needed for muscle spasms.   oxyCODONE-acetaminophen 5-325 MG tablet Commonly known as: Percocet Take 1-2 tablets by mouth every 6 (six) hours as needed for severe pain (pain score 7-10).   rosuvastatin 10 MG tablet Commonly known as: CRESTOR Take 10 mg by mouth daily.   Trelegy Ellipta 200-62.5-25 MCG/ACT  Aepb Generic drug: Fluticasone-Umeclidin-Vilant Take 1 puff by mouth daily.   Vitamin D (Ergocalciferol) 1.25 MG (50000 UNIT) Caps capsule Commonly known as: DRISDOL Take 50,000 Units by mouth once a week.        Follow-up Information      Eldred Manges, MD Follow up in 8 day(s).   Specialty: Orthopedic Surgery Why: see Dr. Ophelia Charter in [facility] office in about one week . call for appt if you do not already have one scheduled. Contact information: [address] [place] [address] [phone]                 Signed: Eldred Manges 10/04/2023, 7:57 AM

## 2023-10-11 ENCOUNTER — Encounter: Payer: Self-pay | Admitting: Orthopaedic Surgery

## 2023-10-11 ENCOUNTER — Ambulatory Visit (INDEPENDENT_AMBULATORY_CARE_PROVIDER_SITE_OTHER): Payer: 59 | Admitting: Orthopaedic Surgery

## 2023-10-11 ENCOUNTER — Other Ambulatory Visit (INDEPENDENT_AMBULATORY_CARE_PROVIDER_SITE_OTHER): Payer: 59

## 2023-10-11 VITALS — BP 112/69 | HR 73 | Ht 62.0 in | Wt 184.0 lb

## 2023-10-11 DIAGNOSIS — Z96641 Presence of right artificial hip joint: Secondary | ICD-10-CM

## 2023-10-11 NOTE — Progress Notes (Signed)
Post-Op Visit Note   Patient: Sheri Rivera           Date of Birth: [birth year]           MRN: [id number] Visit Date: 10/11/2023 PCP: Dois Davenport, MD   Assessment & Plan: Patient with scoliosis pelvic obliquity which appears increased postop.  She appears to be about 8 mm long compared to the opposite side difficult to determine with the pelvic obliquity.  Will put a 5 /16th shoe lift in the opposite left heel.  Incision looks good new dressing applied.Marland Kitchen  She will return in 1 week for staple removal.  Good relief of preop hip pain.  She needs a lot of work to get her quad working better and stronger before she remains off of the walker.  She stopped the Percocet states it made her groggy but went back onto her chronic hydrocodone regiment.  Chief Complaint:  Chief Complaint  Patient presents with   Right Hip - Routine Post Op    10/02/2023 Right THA   Visit Diagnoses:  1. S/P total right hip arthroplasty     Plan: Return 1 week for staple removal.  Follow-Up Instructions: No follow-ups on file.   Orders:  Orders Placed This Encounter  Procedures   XR HIP UNILAT W OR W/O PELVIS 2-3 VIEWS RIGHT   No orders of the defined types were placed in this encounter.   Imaging: No results found.  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   Acute respiratory failure with hypoxia (HCC) 08/03/2017   GERD (gastroesophageal reflux disease) 06/13/2016   Chronic obstructive pulmonary disease (HCC) 06/13/2016   Current tobacco use 09/05/2015   History of fracture of vertebra 02/16/2015   Diffuse pain 04/03/2014   Hemorrhage,  postmenopausal 12/29/2013   Chronic obstructive pulmonary disease with acute exacerbation (HCC) 10/30/2013   COPD mixed type (HCC) 03/30/2013   Abdominal pain, epigastric 02/25/2013   Dysphagia 02/25/2013   Gas 02/25/2013   Abdominal bloating 02/25/2013   Precordial pain 02/16/2013   Multinodular goiter 10/05/2012   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   Bladder cystocele 07/21/2012   Clinical depression 11/11/2011   Allergic rhinitis 11/11/2011   URI (upper respiratory infection) 09/28/2011   Sore throat 07/18/2011   Neck swelling 06/22/2011   Ingrown right big toenail 05/23/2011   Chronic back pain 03/16/2011   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/05/2010   Shortness of breath 03/15/2010   GOITER, MULTINODULAR 07/01/2009   FIBROMYALGIA 07/01/2009   ALLERGIC RHINITIS CAUSE UNSPECIFIED 03/02/2008   Anxiety 01/23/2007   TOBACCO DEPENDENCE 01/23/2007   GASTROESOPHAGEAL REFLUX, NO ESOPHAGITIS 01/23/2007   Past Medical History:  Diagnosis Date   Anxiety 11/11/2011   Aortic atherosclerosis (HCC)    Arthritis    Asthma    Bursitis    right shoulder   Chronic back pain    Chronic neck pain    Chronic shoulder pain    COPD (chronic obstructive pulmonary disease) (HCC)    continues to smoke 1ppd   DDD (degenerative disc disease)    Depression    Fibromyalgia    GERD (gastroesophageal reflux disease)    Heart murmur  as a child per pt   Herpes    History of hiatal hernia    repaired   History of laryngeal cancer    History of radiation therapy 08/12/18- 10/03/18   Larynx, prescribed dose of 63 Gy in 28 fractions. She chose to stop treatments after 27 fractions.    Hypothyroidism    IBS (irritable bowel syndrome)    Lumbar radiculopathy    Seasonal allergies    Smoker    Thyroid nodule    multinodular thyroid goiter    Family History  Problem Relation Age of Onset   Lung cancer Mother    Arthritis Mother        Rheumatoid   Bursitis Mother    Asthma Mother    Heart  attack Father 52       Died of MI   Obesity Father    Fibromyalgia Sister    Asthma Child     Past Surgical History:  Procedure Laterality Date   CHOLECYSTECTOMY     DIRECT LARYNGOSCOPY N/A 07/11/2018   Procedure: DIRECT LARYNGOSCOPY WITH BIOPSY;  Surgeon: Drema Halon, MD;  Location: [facility];  Service: ENT;  Laterality: N/A;   HIATAL HERNIA REPAIR     TOTAL HIP ARTHROPLASTY Right 10/02/2023   Procedure: RIGHT TOTAL HIP ARTHROPLASTY ANTERIOR APPROACH;  Surgeon: Eldred Manges, MD;  Location: [facility] OR;  Service: Orthopedics;  Laterality: Right;  NEEDS RNFA   TUBAL LIGATION  11/26/1981   Social History   Occupational History    Employer: [employer]  Tobacco Use   Smoking status: Every Day    Current packs/day: 1.00    Average packs/day: 1 pack/day for 44.9 years (44.9 ttl pk-yrs)    Types: Cigarettes    Start date: 1980   Smokeless tobacco: Never   Tobacco comments:    She is smoking 1-1.5 ppd 09/27/23  Vaping Use   Vaping status: Never Used  Substance and Sexual Activity   Alcohol use: No   Drug use: No   Sexual activity: Not on file

## 2023-10-14 NOTE — Progress Notes (Deleted)
Cardiology Office Note:    Date:  10/14/2023   ID:  Sheri Rivera, DOB 10/13/1961, MRN [id number]  PCP:  Dois Davenport, MD   [facility] Providers Cardiologist:  None { Click to update primary MD,subspecialty MD or APP then REFRESH:1}    Referring MD: Dois Davenport, MD   No chief complaint on file. ***  History of Present Illness:    Sheri Rivera is a 62 y.o. female is seen at the request of Dr Hal Hope for evaluation of coronary artery calcification. She has a history of tobacco use, COPD and laryngeal CA. She was seen last in Sept 2021. Coronary calcium score was elevated but CTA showed nonobstructive CAD so risk factor modification recommended.   Past Medical History:  Diagnosis Date   Anxiety 11/11/2011   Aortic atherosclerosis (HCC)    Arthritis    Asthma    Bursitis    right shoulder   Chronic back pain    Chronic neck pain    Chronic shoulder pain    COPD (chronic obstructive pulmonary disease) (HCC)    continues to smoke 1ppd   DDD (degenerative disc disease)    Depression    Fibromyalgia    GERD (gastroesophageal reflux disease)    Heart murmur    as a child per pt   Herpes    History of hiatal hernia    repaired   History of laryngeal cancer    History of radiation therapy 08/12/18- 10/03/18   Larynx, prescribed dose of 63 Gy in 28 fractions. She chose to stop treatments after 27 fractions.    Hypothyroidism    IBS (irritable bowel syndrome)    Lumbar radiculopathy    Seasonal allergies    Smoker    Thyroid nodule    multinodular thyroid goiter    Past Surgical History:  Procedure Laterality Date   CHOLECYSTECTOMY     DIRECT LARYNGOSCOPY N/A 07/11/2018   Procedure: DIRECT LARYNGOSCOPY WITH BIOPSY;  Surgeon: Drema Halon, MD;  Location: [facility];  Service: ENT;  Laterality: N/A;   HIATAL HERNIA REPAIR     TOTAL HIP ARTHROPLASTY Right 10/02/2023   Procedure: RIGHT TOTAL HIP ARTHROPLASTY ANTERIOR  APPROACH;  Surgeon: Eldred Manges, MD;  [facility];  Service: Orthopedics;  Laterality: Right;  NEEDS RNFA   TUBAL LIGATION  11/26/1981    Current Medications: No outpatient medications have been marked as taking for the 10/15/23 encounter (Appointment) with [place], Estreya Clay M, MD.     Allergies:   Clindamycin, Cymbalta [duloxetine hcl], Levaquin [levofloxacin in d5w], Pregabalin, Shellfish allergy, Sulfa antibiotics, Sulfamethoxazole, and Codeine   Social History   Socioeconomic History   Marital status: Divorced    Spouse name: Not on file   Number of children: 1   Years of education: Not on file   Highest education level: Not on file  Occupational History    Employer: [employer]  Tobacco Use   Smoking status: Every Day    Current packs/day: 1.00    Average packs/day: 1 pack/day for 44.9 years (44.9 ttl pk-yrs)    Types: Cigarettes    Start date: 1980   Smokeless tobacco: Never   Tobacco comments:    She is smoking 1-1.5 ppd 09/27/23  Vaping Use   Vaping status: Never Used  Substance and Sexual Activity   Alcohol use: No   Drug use: No   Sexual activity: Not on file  Other Topics Concern   Not  on file  Social History Narrative   She lives by herself. Pt has 1 biological daughter and 1 adopted daughter. Biological son passed away 10-23-2023   Social Determinants of Health   Financial Resource Strain: Not on file  Food Insecurity: No Food Insecurity (10/02/2023)   Hunger Vital Sign    Worried About Running Out of Food in the Last Year: Never true    Ran Out of Food in the Last Year: Never true  Transportation Needs: No Transportation Needs (10/02/2023)   PRAPARE - Administrator, Civil Service (Medical): No    Lack of Transportation (Non-Medical): No  Physical Activity: Not on file  Stress: Not on file  Social Connections: Unknown (05/16/2022)   Received from [facility], [facility]   Social Network    Social Network: Not on file     Family  History: The patient's ***family history includes Arthritis in her mother; Asthma in her child and mother; Bursitis in her mother; Fibromyalgia in her sister; Heart attack (age of onset: 101) in her father; Lung cancer in her mother; Obesity in her father.  ROS:   Please see the history of present illness.    *** All other systems reviewed and are negative.  EKGs/Labs/Other Studies Reviewed:    The following studies were reviewed today: Cardiac/Coronary  CT   TECHNIQUE: The patient was scanned on a [employer].   FINDINGS: A 120 kV prospective scan was triggered in the descending thoracic aorta at 111 HU's. Axial non-contrast 3 mm slices were carried out through the heart. The data set was analyzed on a dedicated work station and scored using the Agatson method. Gantry rotation speed was 250 msecs and collimation was .6 mm. No beta blockade and 0.8 mg of sl NTG was given. The 3D data set was reconstructed in 5% intervals of the 67-82 % of the R-R cycle. Diastolic phases were analyzed on a dedicated work station using MPR, MIP and VRT modes. The patient received 80 cc of contrast.   Aorta:  Normal size.  No calcifications.  No dissection.   Aortic Valve:  Trileaflet.  No calcifications.   Coronary Arteries:  Normal coronary origin.  Left dominance.   RCA is a small nondominant artery.  There is no plaque.   Left main is a large artery that gives rise to LAD and LCX arteries.   LAD is a large vessel; there is minimal (0-24%) calcified stenosis in the proximal vessel followed by minimal (0-24%) calcified stenoses in the proximal to mid and mid vessel.   LCX is a dominant artery that gives rise to large OM1, OM2 and PDA. There is minimal (0-24%) calcified stenosis in the proximal vessel.   Other findings:   Normal pulmonary vein drainage into the left atrium.   Normal let atrial appendage without a thrombus.   Normal size of the pulmonary artery.    IMPRESSION: 1. Coronary calcium score of 172. This was 26 percentile for age and sex matched control.   2. Normal coronary origin with left dominance.   3. Miinimal nonobstructive CAD; CAD-RADS 1.   Olga Millers     Electronically Signed   By: Olga Millers M.D.   On: 09/15/2020 11:48        Recent Labs: 09/27/2023: BUN 9; Creatinine, Ser 0.53; Potassium 3.7; Sodium 137 10/03/2023: Hemoglobin 12.1; Platelets 266  Recent Lipid Panel    Component Value Date/Time   CHOL (H) 09/01/2009 0508    208  ATP III CLASSIFICATION:  <200     mg/dL   Desirable  528-413  mg/dL   Borderline High  >=244    mg/dL   High          TRIG 010 09/01/2009 0508   HDL 57 09/01/2009 0508   CHOLHDL 3.6 09/01/2009 0508   VLDL 21 09/01/2009 0508   LDLCALC (H) 09/01/2009 0508    130        Total Cholesterol/HDL:CHD Risk Coronary Heart Disease Risk Table                     Men   Women  1/2 Average Risk   3.4   3.3  Average Risk       5.0   4.4  2 X Average Risk   9.6   7.1  3 X Average Risk  23.4   11.0        Use the calculated Patient Ratio above and the CHD Risk Table to determine the patient's CHD Risk.        ATP III CLASSIFICATION (LDL):  <100     mg/dL   Optimal  272-536  mg/dL   Near or Above                    Optimal  130-159  mg/dL   Borderline  644-034  mg/dL   High  >742     mg/dL   Very High     Risk Assessment/Calculations:   {Does this patient have ATRIAL FIBRILLATION?:[id number]}  No BP recorded.  {Refresh Note OR Click here to enter BP  :1}***         Physical Exam:    VS:  There were no vitals taken for this visit.    Wt Readings from Last 3 Encounters:  10/11/23 184 lb (83.5 kg)  10/02/23 184 lb (83.5 kg)  09/27/23 181 lb 11.2 oz (82.4 kg)     GEN: *** Well nourished, well developed in no acute distress HEENT: Normal NECK: No JVD; No carotid bruits LYMPHATICS: No lymphadenopathy CARDIAC: ***RRR, no murmurs, rubs, gallops RESPIRATORY:  Clear  to auscultation without rales, wheezing or rhonchi  ABDOMEN: Soft, non-tender, non-distended MUSCULOSKELETAL:  No edema; No deformity  SKIN: Warm and dry NEUROLOGIC:  Alert and oriented x 3 PSYCHIATRIC:  Normal affect   ASSESSMENT:    No diagnosis found. PLAN:    In order of problems listed above:  ***      {Are you ordering a CV Procedure (e.g. stress test, cath, DCCV, TEE, etc)?   Press F2        :[id number]}    Medication Adjustments/Labs and Tests Ordered: Current medicines are reviewed at length with the patient today.  Concerns regarding medicines are outlined above.  No orders of the defined types were placed in this encounter.  No orders of the defined types were placed in this encounter.   There are no Patient Instructions on file for this visit.   Signed, Kaoru Rezendes [place], MD  10/14/2023 7:56 AM    [facility]

## 2023-10-15 ENCOUNTER — Ambulatory Visit: Payer: 59 | Attending: Cardiology | Admitting: Cardiology

## 2023-10-15 ENCOUNTER — Telehealth: Payer: Self-pay

## 2023-10-15 ENCOUNTER — Telehealth: Payer: Self-pay | Admitting: Orthopaedic Surgery

## 2023-10-15 MED ORDER — HYDROCODONE-ACETAMINOPHEN 7.5-325 MG PO TABS: 1.0000 | ORAL_TABLET | Freq: Four times a day (QID) | ORAL | 0 refills | Status: DC | PRN

## 2023-10-15 NOTE — Telephone Encounter (Signed)
Pt called in stating she would like to know if she can drive she is home alone, she needs food for her house she is not on meds she had right total hip on 10/02/23.

## 2023-10-18 NOTE — Telephone Encounter (Signed)
done

## 2023-10-22 ENCOUNTER — Encounter: Payer: Self-pay | Admitting: Orthopaedic Surgery

## 2023-10-22 ENCOUNTER — Ambulatory Visit: Payer: 59 | Admitting: Orthopaedic Surgery

## 2023-10-22 VITALS — BP 96/63 | HR 71 | Ht 62.0 in | Wt 184.0 lb

## 2023-10-22 DIAGNOSIS — Z96641 Presence of right artificial hip joint: Secondary | ICD-10-CM

## 2023-10-22 NOTE — Progress Notes (Signed)
Post-Op Visit Note   Patient: Sheri Rivera           Date of Birth: 03-Sep-1961           MRN: [id number] Visit Date: 10/22/2023 PCP: Dois Davenport, MD   Assessment & Plan: Follow-up hip arthroplasty right hip.  She has coronaries lifting side her opposite left hip.  Incision looks good staples are harvested.  She is back on her normal hydrocodone that she was taking preop.  She will her to the store and walked all the way through the grocery store having increased thigh pain and some groin pain.  Recheck 1 month.  Will obtain standing AP pelvis to show hip lengths with both knees extended and her shoes on when she returns in 1 month.  Chief Complaint:  Chief Complaint  Patient presents with   Right Hip - Routine Post Op    10/02/2023 Right THA   Visit Diagnoses:  1. S/P total right hip arthroplasty     Plan: Return in 1 month continue ambulation.  Single x-ray standing as above on return  Follow-Up Instructions: Return in about 1 month (around 11/21/2023).   Orders:  No orders of the defined types were placed in this encounter.  No orders of the defined types were placed in this encounter.   Imaging: No results found.  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   Acute respiratory failure with hypoxia (HCC) 08/03/2017   GERD (gastroesophageal reflux disease) 06/13/2016   Chronic obstructive pulmonary disease (HCC) 06/13/2016   Current tobacco use 09/05/2015   History of fracture of vertebra 02/16/2015   Diffuse pain 04/03/2014   Hemorrhage, postmenopausal 12/29/2013   Chronic obstructive pulmonary disease with acute exacerbation (HCC) 10/30/2013   COPD mixed type (HCC)  03/30/2013   Abdominal pain, epigastric 02/25/2013   Dysphagia 02/25/2013   Gas 02/25/2013   Abdominal bloating 02/25/2013   Precordial pain 02/16/2013   Multinodular goiter 10/05/2012   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   Bladder cystocele 07/21/2012   Clinical depression 11/11/2011   Allergic rhinitis 11/11/2011   URI (upper respiratory infection) 09/28/2011   Sore throat 07/18/2011   Neck swelling 06/22/2011   Ingrown right big toenail 05/23/2011   Chronic back pain 03/16/2011   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/05/2010   Shortness of breath 03/15/2010   GOITER, MULTINODULAR 07/01/2009   FIBROMYALGIA 07/01/2009   ALLERGIC RHINITIS CAUSE UNSPECIFIED 03/02/2008   Anxiety 01/23/2007   TOBACCO DEPENDENCE 01/23/2007   GASTROESOPHAGEAL REFLUX, NO ESOPHAGITIS 01/23/2007   Past Medical History:  Diagnosis Date   Anxiety 11/11/2011   Aortic atherosclerosis (HCC)    Arthritis    Asthma    Bursitis    right shoulder   Chronic back pain    Chronic neck pain    Chronic shoulder pain    COPD (chronic obstructive pulmonary disease) (HCC)    continues to smoke 1ppd   DDD (degenerative disc disease)    Depression    Fibromyalgia    GERD (gastroesophageal reflux disease)    Heart murmur    as a child per pt   Herpes    History of hiatal hernia    repaired   History  of laryngeal cancer    History of radiation therapy 08/12/18- 10/03/18   Larynx, prescribed dose of 63 Gy in 28 fractions. She chose to stop treatments after 27 fractions.    Hypothyroidism    IBS (irritable bowel syndrome)    Lumbar radiculopathy    Seasonal allergies    Smoker    Thyroid nodule    multinodular thyroid goiter    Family History  Problem Relation Age of Onset   Lung cancer Mother    Arthritis Mother        Rheumatoid   Bursitis Mother    Asthma Mother    Heart attack Father 75       Died of MI   Obesity Father    Fibromyalgia Sister    Asthma Child     Past Surgical History:   Procedure Laterality Date   CHOLECYSTECTOMY     DIRECT LARYNGOSCOPY N/A 07/11/2018   Procedure: DIRECT LARYNGOSCOPY WITH BIOPSY;  Surgeon: Drema Halon, MD;  Location: [facility];  Service: ENT;  Laterality: N/A;   HIATAL HERNIA REPAIR     TOTAL HIP ARTHROPLASTY Right 10/02/2023   Procedure: RIGHT TOTAL HIP ARTHROPLASTY ANTERIOR APPROACH;  Surgeon: Eldred Manges, MD;  Location: [facility];  Service: Orthopedics;  Laterality: Right;  NEEDS RNFA   TUBAL LIGATION  11/26/1981   Social History   Occupational History    Employer: [employer]  Tobacco Use   Smoking status: Every Day    Current packs/day: 1.00    Average packs/day: 1 pack/day for 44.9 years (44.9 ttl pk-yrs)    Types: Cigarettes    Start date: 1980   Smokeless tobacco: Never   Tobacco comments:    She is smoking 1-1.5 ppd 09/27/23  Vaping Use   Vaping status: Never Used  Substance and Sexual Activity   Alcohol use: No   Drug use: No   Sexual activity: Not on file

## 2023-11-01 ENCOUNTER — Encounter: Payer: 59 | Admitting: Physical Medicine and Rehabilitation

## 2023-11-14 ENCOUNTER — Telehealth: Payer: Self-pay | Admitting: Physical Medicine and Rehabilitation

## 2023-11-14 MED ORDER — HYDROCODONE-ACETAMINOPHEN 7.5-325 MG PO TABS: 1.0000 | ORAL_TABLET | Freq: Four times a day (QID) | ORAL | 0 refills | Status: DC | PRN

## 2023-11-14 NOTE — Addendum Note (Signed)
Addended by: Genice Rouge on: 11/14/2023 06:52 PM   Modules accepted: Orders

## 2023-11-14 NOTE — Telephone Encounter (Signed)
Patient called in requesting a medication refill on Hydrocodone , patient uses [facility] on [address]

## 2023-11-26 ENCOUNTER — Ambulatory Visit (INDEPENDENT_AMBULATORY_CARE_PROVIDER_SITE_OTHER): Payer: 59 | Admitting: Orthopaedic Surgery

## 2023-11-26 ENCOUNTER — Other Ambulatory Visit (INDEPENDENT_AMBULATORY_CARE_PROVIDER_SITE_OTHER): Payer: Self-pay

## 2023-11-26 VITALS — BP 137/79 | HR 73

## 2023-11-26 DIAGNOSIS — Z96641 Presence of right artificial hip joint: Secondary | ICD-10-CM

## 2023-11-26 NOTE — Progress Notes (Signed)
 Post-Op Visit Note   Patient: Sheri Rivera           Date of Birth: 21-Jan-1961           MRN: [id number] Visit Date: 11/26/2023 PCP: Burney Darice CROME, MD   Assessment & Plan: 62 year old female returns post right total hip arthroplasty states she is having significant problem.  Soreness around her hip prominent Trendelenburg gait.  Previous x-rays showed severe pelvic obliquity she does have some lumbar curvature and has some known moderate stenosis at L4-5 with some facet arthropathy.  She has been ambulating with a cane and forward flexed position at the hips with a Trendelenburg gait.  States she cannot walk far, cannot do her chores and leg feels very weak.  Additional problems include history of tobacco use COPD, depression.  She has had car problems battery exchange did not fix it and she needs a new alternator and had a friend bring her to the office today.  Positive Trendelenburg gait.  Standing x-rays with the Corder and she will lift in her left foot evens her up with level pelvis.  Incision looks good she has some subcutaneous scar tissue adjacent to the incision which remains mildly tender.  Dorsiflexion plantarflexion of the ankle is intact.  Chief Complaint:  Chief Complaint  Patient presents with   Right Hip - Pain    10/02/2023 Right THA   Visit Diagnoses:  1. S/P total right hip arthroplasty     Plan: Will set her up for some therapy for abduction strengthening quad strengthening and work on gait.  She like to go 2 times a week since she has to rely on her neighbor or friend to bring her.  Recheck 1 month.  Once her car is serviced and she may be able to participate in therapy 3 times a week.  Follow-Up Instructions: Return in about 1 month (around 12/27/2023).   Orders:  Orders Placed This Encounter  Procedures   XR Pelvis 1-2 Views   No orders of the defined types were placed in this encounter.   Imaging: No results found.  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   Acute respiratory failure with hypoxia (HCC) 08/03/2017   GERD (gastroesophageal reflux disease) 06/13/2016   Chronic obstructive pulmonary disease (HCC) 06/13/2016   Current tobacco use 09/05/2015   History of fracture of vertebra 02/16/2015   Diffuse pain 04/03/2014   Hemorrhage, postmenopausal 12/29/2013   Chronic obstructive pulmonary disease with acute exacerbation (HCC) 10/30/2013   COPD mixed type (HCC) 03/30/2013   Abdominal pain, epigastric 02/25/2013   Dysphagia 02/25/2013   Gas 02/25/2013   Abdominal bloating 02/25/2013   Precordial pain 02/16/2013   Multinodular goiter 10/05/2012   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   Bladder cystocele 07/21/2012   Clinical depression 11/11/2011   Allergic rhinitis 11/11/2011   URI (upper respiratory infection) 09/28/2011   Sore throat 07/18/2011   Neck swelling 06/22/2011   Ingrown right big toenail 05/23/2011   Chronic back pain 03/16/2011   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/05/2010   Shortness of breath 03/15/2010   GOITER, MULTINODULAR 07/01/2009   FIBROMYALGIA 07/01/2009   ALLERGIC RHINITIS CAUSE UNSPECIFIED 03/02/2008   Anxiety 01/23/2007  TOBACCO DEPENDENCE 01/23/2007   GASTROESOPHAGEAL REFLUX, NO ESOPHAGITIS 01/23/2007   Past Medical History:  Diagnosis Date   Anxiety 11/11/2011   Aortic atherosclerosis (HCC)    Arthritis    Asthma    Bursitis    right shoulder   Chronic back pain    Chronic neck pain    Chronic shoulder pain    COPD (chronic obstructive pulmonary disease) (HCC)    continues to smoke 1ppd   DDD (degenerative disc disease)    Depression    Fibromyalgia    GERD (gastroesophageal reflux  disease)    Heart murmur    as a child per pt   Herpes    History of hiatal hernia    repaired   History of laryngeal cancer    History of radiation therapy 08/12/18- 10/03/18   Larynx, prescribed dose of 63 Gy in 28 fractions. She chose to stop treatments after 27 fractions.    Hypothyroidism    IBS (irritable bowel syndrome)    Lumbar radiculopathy    Seasonal allergies    Smoker    Thyroid  nodule    multinodular thyroid  goiter    Family History  Problem Relation Age of Onset   Lung cancer Mother    Arthritis Mother        Rheumatoid   Bursitis Mother    Asthma Mother    Heart attack Father 61       Died of MI   Obesity Father    Fibromyalgia Sister    Asthma Child     Past Surgical History:  Procedure Laterality Date   CHOLECYSTECTOMY     DIRECT LARYNGOSCOPY N/A 07/11/2018   Procedure: DIRECT LARYNGOSCOPY WITH BIOPSY;  Surgeon: Ethyl Lonni BRAVO, MD;  Location: [facility];  Service: ENT;  Laterality: N/A;   HIATAL HERNIA REPAIR     TOTAL HIP ARTHROPLASTY Right 10/02/2023   Procedure: RIGHT TOTAL HIP ARTHROPLASTY ANTERIOR APPROACH;  Surgeon: Barbarann Oneil BROCKS, MD;  Location: [facility];  Service: Orthopedics;  Laterality: Right;  NEEDS RNFA   TUBAL LIGATION  11/26/1981   Social History   Occupational History    Employer: [employer]  Tobacco Use   Smoking status: Every Day    Current packs/day: 1.00    Average packs/day: 1 pack/day for 45.0 years (45.0 ttl pk-yrs)    Types: Cigarettes    Start date: 1980   Smokeless tobacco: Never   Tobacco comments:    She is smoking 1-1.5 ppd 09/27/23  Vaping Use   Vaping status: Never Used  Substance and Sexual Activity   Alcohol use: No   Drug use: No   Sexual activity: Not on file

## 2023-11-26 NOTE — Addendum Note (Signed)
 Addended by: Rogers Seeds on: 11/26/2023 12:01 PM   Modules accepted: Orders

## 2023-11-29 ENCOUNTER — Encounter: Payer: 59 | Admitting: Orthopaedic Surgery

## 2023-12-10 ENCOUNTER — Telehealth: Payer: Self-pay

## 2023-12-10 ENCOUNTER — Other Ambulatory Visit: Payer: Self-pay | Admitting: Orthopaedic Surgery

## 2023-12-10 MED ORDER — HYDROCODONE-ACETAMINOPHEN 10-325 MG PO TABS: 1.0000 | ORAL_TABLET | Freq: Four times a day (QID) | ORAL | 0 refills | Status: DC | PRN

## 2023-12-10 NOTE — Telephone Encounter (Signed)
 Patient called stating that she is not doing so good and would like a call back to discuss.  CB# [phone].  Please advise.  Thank you.

## 2023-12-11 NOTE — Telephone Encounter (Signed)
 noted

## 2023-12-19 ENCOUNTER — Telehealth: Payer: Self-pay

## 2023-12-19 ENCOUNTER — Ambulatory Visit: Payer: 59 | Admitting: Physical Therapy

## 2023-12-19 NOTE — Telephone Encounter (Signed)
Patient called in because they had surgery and Dr Ophelia Charter prescribed her on 12/10/23 and was given an 8 day supply, she was given the hydrocodone 10-325 and she would like to know when she will be able to get her pain medication refill, she was not sure if she will be continuing with the 10-325 dose or the 7.5-325 dose going forward. Per pmp :   Filled  Written  ID  Drug  QTY  Days  Prescriber  RX #  Dispenser  Refill  Daily Dose*  Pymt Type  PMP  12/10/2023 12/10/2023 1  Hydrocodone-Acetamin 10-325 Mg 32.00 8 Ma Yat [id number] [facility] (0531) 0/0 40.00 MME Medicare [place] 11/22/2023 11/14/2023 1  Hydrocodone-Acetamin 7.5-325 120.00 30 Me Lov [id number] [facility] (0531) 0/0 30.00 MME Medicare [place]

## 2023-12-20 MED ORDER — HYDROCODONE-ACETAMINOPHEN 7.5-325 MG PO TABS: 1.0000 | ORAL_TABLET | Freq: Four times a day (QID) | ORAL | 0 refills | Status: DC | PRN

## 2023-12-20 NOTE — Telephone Encounter (Signed)
Called patient to let her new meds have been sent in

## 2023-12-25 NOTE — Therapy (Deleted)
 OUTPATIENT PHYSICAL THERAPY LOWER EXTREMITY EVALUATION   Patient Name: Sheri Rivera MRN: [id number] DOB:March 24, 1961, 63 y.o., female Today's Date: 12/26/2023  END OF SESSION:   Past Medical History:  Diagnosis Date   Anxiety 11/11/2011   Aortic atherosclerosis (HCC)    Arthritis    Asthma    Bursitis    right shoulder   Chronic back pain    Chronic neck pain    Chronic shoulder pain    COPD (chronic obstructive pulmonary disease) (HCC)    continues to smoke 1ppd   DDD (degenerative disc disease)    Depression    Fibromyalgia    GERD (gastroesophageal reflux disease)    Heart murmur    as a child per pt   Herpes    History of hiatal hernia    repaired   History of laryngeal cancer    History of radiation therapy 08/12/18- 10/03/18   Larynx, prescribed dose of 63 Gy in 28 fractions. She chose to stop treatments after 27 fractions.    Hypothyroidism    IBS (irritable bowel syndrome)    Lumbar radiculopathy    Seasonal allergies    Smoker    Thyroid nodule    multinodular thyroid goiter   Past Surgical History:  Procedure Laterality Date   CHOLECYSTECTOMY     DIRECT LARYNGOSCOPY N/A 07/11/2018   Procedure: DIRECT LARYNGOSCOPY WITH BIOPSY;  Surgeon: Drema Halon, MD;  Location:  [facility];  Service: ENT;  Laterality: N/A;   HIATAL HERNIA REPAIR     TOTAL HIP ARTHROPLASTY Right 10/02/2023   Procedure: RIGHT TOTAL HIP ARTHROPLASTY ANTERIOR APPROACH;  Surgeon: Eldred Manges, MD;  [facility];  Service: Orthopedics;  Laterality: Right;  NEEDS RNFA   TUBAL LIGATION  11/26/1981   Patient Active Problem List   Diagnosis Date Noted   S/P total right hip arthroplasty 10/02/2023   Neck pain 09/04/2022   Spinal stenosis of lumbar region 04/30/2022   Fibromyalgia 09/01/2021   Radiation induced neuropathy (HCC) 09/01/2021   Nerve pain 09/01/2021   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   Acute respiratory failure with hypoxia (HCC)  08/03/2017   GERD (gastroesophageal reflux disease) 06/13/2016   Chronic obstructive pulmonary disease (HCC) 06/13/2016   Current tobacco use 09/05/2015   History of fracture of vertebra 02/16/2015   Diffuse pain 04/03/2014   Hemorrhage, postmenopausal 12/29/2013   Chronic obstructive pulmonary disease with acute exacerbation (HCC) 10/30/2013   COPD mixed type (HCC) 03/30/2013   Abdominal pain, epigastric 02/25/2013   Dysphagia 02/25/2013   Gas 02/25/2013   Abdominal bloating 02/25/2013   Precordial pain 02/16/2013   Multinodular goiter 10/05/2012   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   Bladder cystocele 07/21/2012   Clinical depression 11/11/2011   Allergic rhinitis 11/11/2011   URI (upper respiratory infection) 09/28/2011   Sore throat 07/18/2011   Neck swelling 06/22/2011   Ingrown right big toenail 05/23/2011   Chronic back pain 03/16/2011   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/05/2010   Shortness of breath 03/15/2010   GOITER, MULTINODULAR 07/01/2009   FIBROMYALGIA 07/01/2009   ALLERGIC RHINITIS CAUSE UNSPECIFIED 03/02/2008   Anxiety 01/23/2007   TOBACCO DEPENDENCE 01/23/2007   GASTROESOPHAGEAL REFLUX, NO ESOPHAGITIS 01/23/2007    PCP: Dois Davenport, MD   REFERRING PROVIDER: Eldred Manges, MD  REFERRING DIAG: [phone] (ICD-10-CM) - S/P total right hip arthroplasty 10/02/23 anterior approach  THERAPY DIAG:  No diagnosis found.  Rationale for Evaluation and Treatment: Rehabilitation  ONSET DATE: 10/02/23  SUBJECTIVE:   SUBJECTIVE STATEMENT: ***  PERTINENT HISTORY: Assessment & Plan: 63 year old female returns post right total hip arthroplasty states she is having significant problem.  Soreness around her hip prominent Trendelenburg gait.  Previous x-rays showed severe pelvic obliquity she does have some lumbar curvature and has some known moderate stenosis at L4-5 with some facet arthropathy.  She has been ambulating with a cane and forward flexed position at the  hips with a Trendelenburg gait.  States she cannot walk far, cannot do her chores and leg feels very weak.  Additional problems include history of tobacco use COPD, depression.  She has had car problems battery exchange did not fix it and she needs a new alternator and had a friend bring her to the office today.   Positive Trendelenburg gait.  Standing x-rays with the Corder and she will lift in her left foot evens her up with level pelvis.  Incision looks good she has some subcutaneous scar tissue adjacent to the incision which remains mildly tender.  Dorsiflexion plantarflexion of the ankle is intact. PAIN:  Are you having pain? {OPRCPAIN:27236}  PRECAUTIONS: Anterior hip  RED FLAGS: None   WEIGHT BEARING RESTRICTIONS: No  FALLS:  Has patient fallen in last 6 months? No  OCCUPATION: ***  PLOF: Independent  PATIENT GOALS: ***  NEXT MD VISIT: ***  OBJECTIVE:  Note: Objective measures were completed at Evaluation unless otherwise noted.  DIAGNOSTIC FINDINGS: none  PATIENT SURVEYS:  LEFS ***  MUSCLE LENGTH: Hamstrings: Right *** deg; Left *** deg Maisie Fus test: Right *** deg; Left *** deg  POSTURE: {posture:25561}  PALPATION: ***  LOWER EXTREMITY ROM:  {AROM/PROM:27142} ROM Right eval Left eval  Hip flexion    Hip extension    Hip abduction    Hip adduction    Hip internal rotation    Hip external rotation    Knee flexion    Knee extension    Ankle dorsiflexion    Ankle plantarflexion    Ankle inversion    Ankle eversion     (Blank rows = not tested)  LOWER EXTREMITY MMT:  MMT Right eval Left eval  Hip flexion    Hip extension    Hip abduction    Hip adduction    Hip internal rotation    Hip external rotation    Knee flexion    Knee extension    Ankle dorsiflexion    Ankle plantarflexion    Ankle inversion    Ankle eversion     (Blank rows = not tested)  LOWER EXTREMITY SPECIAL TESTS:  Hip special tests: Trendelenburg test:  {pos/neg:25230}  FUNCTIONAL TESTS:  30 seconds chair stand test  GAIT: Distance walked: *** Assistive device utilized: {Assistive devices:23999} Level of assistance: {Levels of assistance:24026} Comments: ***  TREATMENT DATE: ***    PATIENT EDUCATION:  Education details: Discussed eval findings, rehab rationale and POC and patient is in agreement  Person educated: Patient Education method: Explanation Education comprehension: verbalized understanding and needs further education  HOME EXERCISE PROGRAM: ***  ASSESSMENT:  CLINICAL IMPRESSION: Patient is a *** y.o. *** who was seen today for physical therapy evaluation and treatment for ***.   OBJECTIVE IMPAIRMENTS: {opptimpairments:25111}.   ACTIVITY LIMITATIONS: {activitylimitations:27494}  PARTICIPATION LIMITATIONS: {participationrestrictions:25113}  PERSONAL FACTORS: {Personal factors:25162} are also affecting patient's functional outcome.   REHAB POTENTIAL: Good  CLINICAL DECISION MAKING: Evolving/moderate complexity  EVALUATION COMPLEXITY: Low   GOALS: Goals reviewed with patient? No  SHORT TERM GOALS: Target date: *** *** Baseline: Goal status: INITIAL  2.  *** Baseline:  Goal status: INITIAL  3.  *** Baseline:  Goal status: INITIAL  4.  *** Baseline:  Goal status: INITIAL  5.  *** Baseline:  Goal status: INITIAL  6.  *** Baseline:  Goal status: INITIAL  LONG TERM GOALS: Target date: ***  *** Baseline:  Goal status: INITIAL  2.  *** Baseline:  Goal status: INITIAL  3.  *** Baseline:  Goal status: INITIAL  4.  *** Baseline:  Goal status: INITIAL  5.  *** Baseline:  Goal status: INITIAL  6.  *** Baseline:  Goal status: INITIAL   PLAN:  PT FREQUENCY: 1-2x/week  PT DURATION: 6 weeks  PLANNED INTERVENTIONS: 97164- PT Re-evaluation,  97110-Therapeutic exercises, 97530- Therapeutic activity, 97112- Neuromuscular re-education, 97535- Self Care, 62952- Manual therapy, 97116- Gait training, Balance training, Stair training, and Dry Needling  PLAN FOR NEXT SESSION: HEP review and update, manual techniques as appropriate, aerobic tasks, ROM and flexibility activities, strengthening and PREs, TPDN, gait and balance training as needed     Hildred Laser, PT 12/26/2023, 12:19 PM

## 2023-12-26 ENCOUNTER — Ambulatory Visit: Payer: 59 | Attending: Orthopaedic Surgery

## 2023-12-30 ENCOUNTER — Ambulatory Visit: Payer: 59 | Admitting: Physical Medicine and Rehabilitation

## 2023-12-31 ENCOUNTER — Telehealth: Payer: Self-pay | Admitting: Physical Medicine and Rehabilitation

## 2023-12-31 NOTE — Telephone Encounter (Signed)
Patient request an appointment for another back injection

## 2024-01-10 ENCOUNTER — Ambulatory Visit: Payer: 59 | Admitting: Physician Assistant

## 2024-01-13 ENCOUNTER — Ambulatory Visit: Payer: 59 | Admitting: Physical Medicine and Rehabilitation

## 2024-01-16 ENCOUNTER — Telehealth: Payer: Self-pay | Admitting: Physical Medicine and Rehabilitation

## 2024-01-16 MED ORDER — HYDROCODONE-ACETAMINOPHEN 7.5-325 MG PO TABS: 1.0000 | ORAL_TABLET | Freq: Four times a day (QID) | ORAL | 0 refills | Status: DC | PRN

## 2024-01-16 NOTE — Telephone Encounter (Signed)
Patient has Flu and had to cancel her appt 01/16/23. She is scheduled for Jacalyn Lefevre on 01/29/24. She is asking for medication refills be sent to [facility] on [address].

## 2024-01-17 ENCOUNTER — Encounter: Payer: 59 | Admitting: Physical Medicine and Rehabilitation

## 2024-01-29 ENCOUNTER — Encounter: Payer: 59 | Admitting: Registered Nurse

## 2024-02-03 ENCOUNTER — Encounter: Attending: Registered Nurse | Admitting: Registered Nurse

## 2024-02-03 ENCOUNTER — Encounter: Payer: Self-pay | Admitting: Registered Nurse

## 2024-02-03 VITALS — BP 122/76 | HR 104 | Ht 62.0 in | Wt 190.2 lb

## 2024-02-03 DIAGNOSIS — G8929 Other chronic pain: Secondary | ICD-10-CM | POA: Insufficient documentation

## 2024-02-03 DIAGNOSIS — M797 Fibromyalgia: Secondary | ICD-10-CM | POA: Insufficient documentation

## 2024-02-03 DIAGNOSIS — Z5181 Encounter for therapeutic drug level monitoring: Secondary | ICD-10-CM | POA: Insufficient documentation

## 2024-02-03 DIAGNOSIS — M25551 Pain in right hip: Secondary | ICD-10-CM | POA: Insufficient documentation

## 2024-02-03 DIAGNOSIS — M48062 Spinal stenosis, lumbar region with neurogenic claudication: Secondary | ICD-10-CM | POA: Diagnosis present

## 2024-02-03 DIAGNOSIS — Z79891 Long term (current) use of opiate analgesic: Secondary | ICD-10-CM | POA: Insufficient documentation

## 2024-02-03 DIAGNOSIS — M5416 Radiculopathy, lumbar region: Secondary | ICD-10-CM | POA: Insufficient documentation

## 2024-02-03 DIAGNOSIS — G894 Chronic pain syndrome: Secondary | ICD-10-CM | POA: Diagnosis not present

## 2024-02-03 MED ORDER — HYDROCODONE-ACETAMINOPHEN 7.5-325 MG PO TABS: 1.0000 | ORAL_TABLET | Freq: Four times a day (QID) | ORAL | 0 refills | Status: DC | PRN

## 2024-02-03 NOTE — Progress Notes (Signed)
 Subjective:    Patient ID: Sheri Rivera, female    DOB: [birth year], 63 y.o.   MRN: [id number]  HPI: Sheri Rivera is a 63 y.o. female who returns for follow up appointment for chronic pain and medication refill. She states her pain is located in her lower back radiating into her right hip and right lower extremity. She  rates her pain 8. Her current exercise regime is walking short distances with her cane  and performing stretching exercises.  Ms. Lagrange Morphine equivalent is 30.00 MME.   Oral Swab was Performed today.     Pain Inventory Average Pain 8 Pain Right Now 8 My pain is constant, sharp, and aching  In the last 24 hours, has pain interfered with the following? General activity 10 Relation with others 10 Enjoyment of life 10 What TIME of day is your pain at its worst? morning , daytime, evening, and night Sleep (in general) Poor  Pain is worse with: walking, bending, sitting, standing, and some activites Pain improves with: rest, heat/ice, pacing activities, and medication Relief from Meds: 6  Family History  Problem Relation Age of Onset   Lung cancer Mother    Arthritis Mother        Rheumatoid   Bursitis Mother    Asthma Mother    Heart attack Father 44       Died of MI   Obesity Father    Fibromyalgia Sister    Asthma Child    Social History   Socioeconomic History   Marital status: Divorced    Spouse name: Not on file   Number of children: 1   Years of education: Not on file   Highest education level: Not on file  Occupational History    Employer: [employer]  Tobacco Use   Smoking status: Every Day    Current packs/day: 1.00    Average packs/day: 1 pack/day for 45.2 years (45.2 ttl pk-yrs)    Types: Cigarettes    Start date: 03/03/79   Smokeless tobacco: Never   Tobacco comments:    She is smoking 1-1.5 ppd 09/27/23  Vaping Use   Vaping status: Never Used  Substance and Sexual Activity   Alcohol use: No   Drug use: No   Sexual  activity: Not on file  Other Topics Concern   Not on file  Social History Narrative   She lives by herself. Pt has 1 biological daughter and 1 adopted daughter. Biological son passed away 03-03-2023   Social Drivers of Health   Financial Resource Strain: Not on file  Food Insecurity: No Food Insecurity (10/02/2023)   Hunger Vital Sign    Worried About Running Out of Food in the Last Year: Never true    Ran Out of Food in the Last Year: Never true  Transportation Needs: No Transportation Needs (10/02/2023)   PRAPARE - Administrator, Civil Service (Medical): No    Lack of Transportation (Non-Medical): No  Physical Activity: Not on file  Stress: Not on file  Social Connections: Unknown (05/16/2022)   [facility]   Social Network    Social Network: Not on file   Past Surgical History:  Procedure Laterality Date   CHOLECYSTECTOMY     DIRECT LARYNGOSCOPY N/A 07/11/2018   Procedure: DIRECT LARYNGOSCOPY WITH BIOPSY;  Surgeon: Drema Halon, MD;  Location: [facility];  Service: ENT;  Laterality: N/A;   HIATAL HERNIA REPAIR  TOTAL HIP ARTHROPLASTY Right 10/02/2023   Procedure: RIGHT TOTAL HIP ARTHROPLASTY ANTERIOR APPROACH;  Surgeon: Eldred Manges, MD;  Location: [facility];  Service: Orthopedics;  Laterality: Right;  NEEDS RNFA   TUBAL LIGATION  11/26/1981   Past Surgical History:  Procedure Laterality Date   CHOLECYSTECTOMY     DIRECT LARYNGOSCOPY N/A 07/11/2018   Procedure: DIRECT LARYNGOSCOPY WITH BIOPSY;  Surgeon: Drema Halon, MD;  Location: [facility];  Service: ENT;  Laterality: N/A;   HIATAL HERNIA REPAIR     TOTAL HIP ARTHROPLASTY Right 10/02/2023   Procedure: RIGHT TOTAL HIP ARTHROPLASTY ANTERIOR APPROACH;  Surgeon: Eldred Manges, MD;  Location: [facility];  Service: Orthopedics;  Laterality: Right;  NEEDS RNFA   TUBAL LIGATION  11/26/1981   Past Medical History:  Diagnosis Date   Anxiety 11/11/2011    Aortic atherosclerosis (HCC)    Arthritis    Asthma    Bursitis    right shoulder   Chronic back pain    Chronic neck pain    Chronic shoulder pain    COPD (chronic obstructive pulmonary disease) (HCC)    continues to smoke 1ppd   DDD (degenerative disc disease)    Depression    Fibromyalgia    GERD (gastroesophageal reflux disease)    Heart murmur    as a child per pt   Herpes    History of hiatal hernia    repaired   History of laryngeal cancer    History of radiation therapy 08/12/18- 10/03/18   Larynx, prescribed dose of 63 Gy in 28 fractions. She chose to stop treatments after 27 fractions.    Hypothyroidism    IBS (irritable bowel syndrome)    Lumbar radiculopathy    Seasonal allergies    Smoker    Thyroid nodule    multinodular thyroid goiter   BP 122/76   Pulse (!) 104   Ht 5\' 2"  (1.575 m)   Wt 190 lb 3.2 oz (86.3 kg)   SpO2 91%   BMI 34.79 kg/m   Opioid Risk Score:   Fall Risk Score:  `1  Depression screen [facility] 2/9     02/03/2024    1:06 PM 09/09/2023    2:41 PM 12/12/2022   11:24 AM 01/19/2022    3:12 PM 09/01/2021    1:26 PM  Depression screen PHQ 2/9  Decreased Interest 0 1 0 1 3  Down, Depressed, Hopeless 0 1 0 0 2  PHQ - 2 Score 0 2 0 1 5  Altered sleeping     1  Tired, decreased energy     2  Change in appetite     0  Feeling bad or failure about yourself      0  Trouble concentrating     0  Moving slowly or fidgety/restless     3  Suicidal thoughts     0  PHQ-9 Score     11  Difficult doing work/chores     Extremely dIfficult     Review of Systems  Musculoskeletal:  Positive for back pain.       Left hip replacement pain  All other systems reviewed and are negative.      Objective:   Physical Exam Vitals and nursing note reviewed.  Constitutional:      Appearance: Normal appearance.  Cardiovascular:     Rate and Rhythm: Normal rate and regular rhythm.     Pulses: Normal pulses.     Heart  sounds: Normal heart sounds.   Pulmonary:     Effort: Pulmonary effort is normal.     Breath sounds: Normal breath sounds.  Musculoskeletal:     Comments: Normal Muscle Bulk and Muscle Testing Reveals:  Upper Extremities:Right: Decreased  ROM 45 Degrees and Muscle Strength 5/5 Thoracic Paraspinal Tenderness: T-4-T-6 Mainly Right Side  Lumbar Paraspinal Tenderness: L-3-L-5 Right Greater Trochanter Tenderness Lower Extremities: Full ROM and Muscle Strength 5/5 Right Lower Extremity Flexion Produces Pain into her Right Hip Narrow Based   Gait     Skin:    General: Skin is warm and dry.  Neurological:     Mental Status: She is alert and oriented to person, place, and time.  Psychiatric:        Mood and Affect: Mood normal.        Behavior: Behavior normal.         Assessment & Plan:  Lumbar Spinal Stenosis:Continue HEP as Tolerated. Ortho Following. Continue to Monitor. 02/03/2024 Chronic Right Hip Pain/ Right Hip Osteoarthritis: S/P Hip Injection Ortho Following. Continue to Monitor. Ms. Talerico reports she is awaiting a surgical date for Hip Replacement. 02/03/2024 3. Fibromyalgia: Continue HEP as Tolerated. Continue current medication regimen. Continue to monitor. 02/03/2024 4. Chronic Pain Syndrome: Continue Hydrocodone 7.5/325 mg one tablet every 6 hours as needed for pain, no prescription given, awaiting UDS results, she verbalizes understanding.  We will continue the opioid monitoring program, this consists of regular clinic visits, examinations, urine drug screen, pill counts as well as use of [place] Controlled Substance Reporting system. A 12 month History has been reviewed on the [place] Controlled Substance Reporting System on 0310/2025   F/U in 2 Months wih Dr Berline Chough, she has a scheduled appointment

## 2024-02-04 ENCOUNTER — Ambulatory Visit (INDEPENDENT_AMBULATORY_CARE_PROVIDER_SITE_OTHER): Admitting: Orthopaedic Surgery

## 2024-02-04 ENCOUNTER — Encounter: Payer: Self-pay | Admitting: Orthopaedic Surgery

## 2024-02-04 VITALS — BP 116/78 | HR 87 | Ht 62.0 in | Wt 190.2 lb

## 2024-02-04 DIAGNOSIS — M7061 Trochanteric bursitis, right hip: Secondary | ICD-10-CM

## 2024-02-04 DIAGNOSIS — M48062 Spinal stenosis, lumbar region with neurogenic claudication: Secondary | ICD-10-CM | POA: Diagnosis not present

## 2024-02-04 NOTE — Progress Notes (Unsigned)
 Office Visit Note   Patient: Sheri Rivera           Date of Birth: [birth year]           MRN: [id number] Visit Date: 02/04/2024              Requested by: Dois Davenport, MD [address] [place],  [place] [address] PCP: Dois Davenport, MD   Assessment & Plan: Visit Diagnoses: No diagnosis found.  Plan: ***  Follow-Up Instructions: No follow-ups on file.   Orders:  No orders of the defined types were placed in this encounter.  No orders of the defined types were placed in this encounter.     Procedures: No procedures performed   Clinical Data: No additional findings.   Subjective: Chief Complaint  Patient presents with   Right Hip - Pain    10/02/2023 Right THA   Lower Back - Pain    HPI  Review of Systems   Objective: Vital Signs: BP 116/78   Pulse 87   Ht 5\' 2"  (1.575 m)   Wt 190 lb 3.2 oz (86.3 kg)   BMI 34.79 kg/m   Physical Exam  Ortho Exam  Specialty Comments:  MRI LUMBAR SPINE WITHOUT CONTRAST   TECHNIQUE: Multiplanar, multisequence MR imaging of the lumbar spine was performed. No intravenous contrast was administered.   COMPARISON:  Lumbar MRI 04/15/2018   FINDINGS: Segmentation:  5 lumbar vertebra   Alignment:  Normal   Vertebrae: Negative for acute fracture or mass. Chronic fracture of L3 unchanged from the prior study   Conus medullaris and cauda equina: Conus extends to the L2 level. Conus and cauda equina appear normal.   Paraspinal and other soft tissues: Negative for paraspinous mass or adenopathy   Disc levels:   L1-2: Negative   L2-3: Mild disc degeneration with disc space narrowing and mild spurring. Negative for stenosis   L3-4: Mild disc bulging and mild facet degeneration. Mild narrowing of the central canal without significant stenosis   L4-5: Disc degeneration with disc space narrowing and disc bulging. Left foraminal disc protrusion similar to the prior study. Moderate left  foraminal narrowing. Bilateral facet degeneration. Moderate central canal stenosis with mild progression from the prior study.   L5-S1: Moderate disc degeneration with disc space narrowing and endplate spurring. Moderate right foraminal narrowing due to disc and osteophyte. No change from the prior study.   IMPRESSION: Mild degenerative change L2-3 and L3-4 without significant stenosis   Left foraminal disc protrusion L4-5 with moderate left foraminal narrowing unchanged from the prior study. Moderate central canal stenosis with mild progression from 2019   Right foraminal encroachment L5-S1 due to disc and osteophyte complex, unchanged     Electronically Signed   By: Marlan Palau M.D.   On: 04/24/2022 10:58  Imaging: No results found.   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   Acute respiratory failure with hypoxia (HCC) 08/03/2017   GERD (gastroesophageal reflux disease) 06/13/2016   Chronic obstructive pulmonary disease (HCC) 06/13/2016   Current tobacco use 09/05/2015   History of fracture of vertebra 02/16/2015   Diffuse pain 04/03/2014   Hemorrhage, postmenopausal 12/29/2013   Chronic  obstructive pulmonary disease with acute exacerbation (HCC) 10/30/2013   COPD mixed type (HCC) 03/30/2013   Abdominal pain, epigastric 02/25/2013   Dysphagia 02/25/2013   Gas 02/25/2013   Abdominal bloating 02/25/2013   Precordial pain 02/16/2013   Multinodular goiter 10/05/2012   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   Bladder cystocele 07/21/2012   Clinical depression 11/11/2011   Allergic rhinitis 11/11/2011   URI (upper respiratory infection) 09/28/2011   Sore throat  07/18/2011   Neck swelling 06/22/2011   Ingrown right big toenail 05/23/2011   Chronic back pain 03/16/2011   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/05/2010   Shortness of breath 03/15/2010   GOITER, MULTINODULAR 07/01/2009   FIBROMYALGIA 07/01/2009   ALLERGIC RHINITIS CAUSE UNSPECIFIED 03/02/2008   Anxiety 01/23/2007   TOBACCO DEPENDENCE 01/23/2007   GASTROESOPHAGEAL REFLUX, NO ESOPHAGITIS 01/23/2007   Past Medical History:  Diagnosis Date   Anxiety 11/11/2011   Aortic atherosclerosis (HCC)    Arthritis    Asthma    Bursitis    right shoulder   Chronic back pain    Chronic neck pain    Chronic shoulder pain    COPD (chronic obstructive pulmonary disease) (HCC)    continues to smoke 1ppd   DDD (degenerative disc disease)    Depression    Fibromyalgia    GERD (gastroesophageal reflux disease)    Heart murmur    as a child per pt   Herpes    History of hiatal hernia    repaired   History of laryngeal cancer    History of radiation therapy 08/12/18- 10/03/18   Larynx, prescribed dose of 63 Gy in 28 fractions. She chose to stop treatments after 27 fractions.    Hypothyroidism    IBS (irritable bowel syndrome)    Lumbar radiculopathy    Seasonal allergies    Smoker    Thyroid nodule    multinodular thyroid goiter    Family History  Problem Relation Age of Onset   Lung cancer Mother    Arthritis Mother        Rheumatoid   Bursitis Mother    Asthma Mother    Heart attack Father 19       Died of MI   Obesity Father    Fibromyalgia Sister    Asthma Child     Past Surgical History:  Procedure Laterality Date   CHOLECYSTECTOMY     DIRECT LARYNGOSCOPY N/A 07/11/2018   Procedure: DIRECT LARYNGOSCOPY WITH BIOPSY;  Surgeon: Drema Halon, MD;  Location: [facility];  Service: ENT;  Laterality: N/A;   HIATAL HERNIA REPAIR     TOTAL HIP ARTHROPLASTY Right 10/02/2023   Procedure: RIGHT TOTAL HIP ARTHROPLASTY ANTERIOR APPROACH;  Surgeon: Eldred Manges, MD;  Location:  [facility];  Service: Orthopedics;  Laterality: Right;  NEEDS RNFA   TUBAL LIGATION  11/26/1981   Social History   Occupational History    Employer: [employer]  Tobacco Use   Smoking status: Every Day    Current packs/day: 1.00    Average packs/day: 1 pack/day for 45.2 years (45.2 ttl pk-yrs)    Types: Cigarettes    Start date: 1980   Smokeless tobacco: Never   Tobacco comments:    She is smoking 1-1.5 ppd 09/27/23  Vaping Use   Vaping status: Never Used  Substance and Sexual Activity   Alcohol use: No   Drug use: No   Sexual activity: Not on file

## 2024-02-05 DIAGNOSIS — M7061 Trochanteric bursitis, right hip: Secondary | ICD-10-CM | POA: Insufficient documentation

## 2024-02-05 MED ORDER — LIDOCAINE HCL 1 % IJ SOLN
1.0000 mL | INTRAMUSCULAR | Status: AC | PRN
  Administered 2024-02-05: 1 mL

## 2024-02-05 MED ORDER — BUPIVACAINE HCL 0.25 % IJ SOLN
2.0000 mL | INTRAMUSCULAR | Status: AC | PRN
  Administered 2024-02-05: 2 mL via INTRA_ARTICULAR

## 2024-02-05 MED ORDER — METHYLPREDNISOLONE ACETATE 40 MG/ML IJ SUSP
40.0000 mg | INTRAMUSCULAR | Status: AC | PRN
  Administered 2024-02-05: 40 mg via INTRA_ARTICULAR

## 2024-02-07 LAB — DRUG TOX MONITOR 1 W/CONF, ORAL FLD
Amphetamines: NEGATIVE ng/mL (ref <10)
Barbiturates: NEGATIVE ng/mL (ref <10)
Benzodiazepines: NEGATIVE ng/mL (ref <0.50)
Buprenorphine: NEGATIVE ng/mL (ref <0.10)
Cocaine: NEGATIVE ng/mL (ref <5.0)
Codeine: NEGATIVE ng/mL (ref <2.5)
Cotinine: 27.6 ng/mL — ABNORMAL HIGH (ref <5.0)
Dihydrocodeine: 3.8 ng/mL — ABNORMAL HIGH (ref <2.5)
Fentanyl: NEGATIVE ng/mL (ref <0.10)
Heroin Metabolite: NEGATIVE ng/mL (ref <1.0)
Hydrocodone: 36.2 ng/mL — ABNORMAL HIGH (ref <2.5)
Hydromorphone: NEGATIVE ng/mL (ref <2.5)
MARIJUANA: NEGATIVE ng/mL (ref <2.5)
MDMA: NEGATIVE ng/mL (ref <10)
Meprobamate: NEGATIVE ng/mL (ref <2.5)
Methadone: NEGATIVE ng/mL (ref <5.0)
Morphine: NEGATIVE ng/mL (ref <2.5)
Nicotine Metabolite: POSITIVE ng/mL — AB (ref <5.0)
Norhydrocodone: 3.1 ng/mL — ABNORMAL HIGH (ref <2.5)
Noroxycodone: NEGATIVE ng/mL (ref <2.5)
Opiates: POSITIVE ng/mL — AB (ref <2.5)
Oxycodone: NEGATIVE ng/mL (ref <2.5)
Oxymorphone: NEGATIVE ng/mL (ref <2.5)
Phencyclidine: NEGATIVE ng/mL (ref <10)
Tapentadol: NEGATIVE ng/mL (ref <5.0)
Tramadol: NEGATIVE ng/mL (ref <5.0)
Zolpidem: NEGATIVE ng/mL (ref <5.0)

## 2024-02-07 LAB — DRUG TOX ALC METAB W/CON, ORAL FLD: Alcohol Metabolite: NEGATIVE ng/mL (ref <25)

## 2024-02-26 ENCOUNTER — Other Ambulatory Visit

## 2024-03-05 ENCOUNTER — Other Ambulatory Visit

## 2024-03-06 ENCOUNTER — Other Ambulatory Visit

## 2024-03-10 ENCOUNTER — Ambulatory Visit (HOSPITAL_BASED_OUTPATIENT_CLINIC_OR_DEPARTMENT_OTHER): Admitting: Student

## 2024-03-12 ENCOUNTER — Other Ambulatory Visit

## 2024-03-21 ENCOUNTER — Other Ambulatory Visit

## 2024-03-25 ENCOUNTER — Ambulatory Visit: Admitting: Physician Assistant

## 2024-03-30 ENCOUNTER — Encounter: Admitting: Registered Nurse

## 2024-04-02 ENCOUNTER — Ambulatory Visit: Admitting: Physician Assistant

## 2024-04-02 ENCOUNTER — Ambulatory Visit: Payer: Self-pay | Admitting: Podiatry

## 2024-04-07 ENCOUNTER — Ambulatory Visit: Payer: Self-pay | Admitting: Podiatry

## 2024-04-08 ENCOUNTER — Encounter: Attending: Registered Nurse | Admitting: Registered Nurse

## 2024-04-08 DIAGNOSIS — M797 Fibromyalgia: Secondary | ICD-10-CM | POA: Insufficient documentation

## 2024-04-08 DIAGNOSIS — G8929 Other chronic pain: Secondary | ICD-10-CM | POA: Insufficient documentation

## 2024-04-08 DIAGNOSIS — M25551 Pain in right hip: Secondary | ICD-10-CM | POA: Insufficient documentation

## 2024-04-08 DIAGNOSIS — G894 Chronic pain syndrome: Secondary | ICD-10-CM | POA: Insufficient documentation

## 2024-04-08 DIAGNOSIS — M48062 Spinal stenosis, lumbar region with neurogenic claudication: Secondary | ICD-10-CM | POA: Insufficient documentation

## 2024-04-08 DIAGNOSIS — M5416 Radiculopathy, lumbar region: Secondary | ICD-10-CM | POA: Insufficient documentation

## 2024-04-08 DIAGNOSIS — Z79891 Long term (current) use of opiate analgesic: Secondary | ICD-10-CM | POA: Insufficient documentation

## 2024-04-08 DIAGNOSIS — Z5181 Encounter for therapeutic drug level monitoring: Secondary | ICD-10-CM | POA: Insufficient documentation

## 2024-04-13 ENCOUNTER — Telehealth: Payer: Self-pay

## 2024-04-13 MED ORDER — HYDROCODONE-ACETAMINOPHEN 7.5-325 MG PO TABS: 1.0000 | ORAL_TABLET | Freq: Four times a day (QID) | ORAL | 0 refills | Status: DC | PRN

## 2024-04-13 NOTE — Telephone Encounter (Signed)
 Patient stated she will need to have the Hydrocodone  refilled on 04/20/2024.Please send to the [facility] on [address] in [place].   PMP Report:  Filled  Written  ID  Drug  QTY  Days  Prescriber  RX #  Dispenser  Refill  Daily Dose*  Pymt Type  PMP  03/21/2024 02/03/2024 1  Hydrocodone -Acetamin 7.5-325 120.00 30 Eu Tho [id number] [facility] ([id number]) 0/0 30.00 MME Medicare [place] 02/19/2024 02/03/2024 1  Hydrocodone -Acetamin 7.5-325 120.00 30 Eu Tho [id number] [facility] (0531) 0/0 30.00 MME Medicare [place]

## 2024-04-28 ENCOUNTER — Emergency Department (HOSPITAL_COMMUNITY)
Admission: EM | Admit: 2024-04-28 | Discharge: 2024-04-28 | Disposition: A | Discharge status: Home / Self Care | Attending: Emergency Medicine | Admitting: Emergency Medicine

## 2024-04-28 ENCOUNTER — Other Ambulatory Visit: Payer: Self-pay

## 2024-04-28 ENCOUNTER — Emergency Department (HOSPITAL_COMMUNITY)

## 2024-04-28 DIAGNOSIS — R0602 Shortness of breath: Secondary | ICD-10-CM | POA: Diagnosis present

## 2024-04-28 DIAGNOSIS — F17200 Nicotine dependence, unspecified, uncomplicated: Secondary | ICD-10-CM | POA: Diagnosis not present

## 2024-04-28 DIAGNOSIS — J441 Chronic obstructive pulmonary disease with (acute) exacerbation: Secondary | ICD-10-CM | POA: Insufficient documentation

## 2024-04-28 DIAGNOSIS — Z7951 Long term (current) use of inhaled steroids: Secondary | ICD-10-CM | POA: Insufficient documentation

## 2024-04-28 LAB — RESP PANEL BY RT-PCR (RSV, FLU A&B, COVID)  RVPGX2
Influenza A by PCR: NEGATIVE
Influenza B by PCR: NEGATIVE
Resp Syncytial Virus by PCR: NEGATIVE
SARS Coronavirus 2 by RT PCR: NEGATIVE

## 2024-04-28 LAB — CBC WITH DIFFERENTIAL/PLATELET
Abs Immature Granulocytes: 0.08 10*3/uL — ABNORMAL HIGH (ref 0.00–0.07)
Basophils Absolute: 0.1 10*3/uL (ref 0.0–0.1)
Basophils Relative: 1 %
Eosinophils Absolute: 0.1 10*3/uL (ref 0.0–0.5)
Eosinophils Relative: 1 %
HCT: 47.3 % — ABNORMAL HIGH (ref 36.0–46.0)
Hemoglobin: 15.8 g/dL — ABNORMAL HIGH (ref 12.0–15.0)
Immature Granulocytes: 1 %
Lymphocytes Relative: 9 %
Lymphs Abs: 0.9 10*3/uL (ref 0.7–4.0)
MCH: 32.3 pg (ref 26.0–34.0)
MCHC: 33.4 g/dL (ref 30.0–36.0)
MCV: 96.7 fL (ref 80.0–100.0)
Monocytes Absolute: 0.5 10*3/uL (ref 0.1–1.0)
Monocytes Relative: 4 %
Neutro Abs: 8.6 10*3/uL — ABNORMAL HIGH (ref 1.7–7.7)
Neutrophils Relative %: 84 %
Platelets: 315 10*3/uL (ref 150–400)
RBC: 4.89 MIL/uL (ref 3.87–5.11)
RDW: 14.5 % (ref 11.5–15.5)
WBC: 10.2 10*3/uL (ref 4.0–10.5)
nRBC: 0 % (ref 0.0–0.2)

## 2024-04-28 LAB — COMPREHENSIVE METABOLIC PANEL WITH GFR
ALT: 12 U/L (ref 0–44)
AST: 18 U/L (ref 15–41)
Albumin: 3.4 g/dL — ABNORMAL LOW (ref 3.5–5.0)
Alkaline Phosphatase: 106 U/L (ref 38–126)
Anion gap: 8 (ref 5–15)
BUN: 5 mg/dL — ABNORMAL LOW (ref 6–20)
CO2: 22 mmol/L (ref 22–32)
Calcium: 9.2 mg/dL (ref 8.9–10.3)
Chloride: 108 mmol/L (ref 98–111)
Creatinine, Ser: 0.46 mg/dL (ref 0.44–1.00)
GFR, Estimated: >60 mL/min (ref >60)
Glucose, Bld: 117 mg/dL — ABNORMAL HIGH (ref 70–99)
Potassium: 3.5 mmol/L (ref 3.5–5.1)
Sodium: 138 mmol/L (ref 135–145)
Total Bilirubin: 0.4 mg/dL (ref 0.0–1.2)
Total Protein: 7 g/dL (ref 6.5–8.1)

## 2024-04-28 LAB — BRAIN NATRIURETIC PEPTIDE: B Natriuretic Peptide: 29.5 pg/mL (ref 0.0–100.0)

## 2024-04-28 MED ORDER — DOXYCYCLINE HYCLATE 100 MG PO CAPS: 100.0000 mg | ORAL_CAPSULE | Freq: Two times a day (BID) | ORAL | 0 refills | Status: AC

## 2024-04-28 MED ORDER — IPRATROPIUM-ALBUTEROL 0.5-2.5 (3) MG/3ML IN SOLN
3.0000 mL | Freq: Once | RESPIRATORY_TRACT | Status: AC
  Administered 2024-04-28: 3 mL via RESPIRATORY_TRACT
  Filled 2024-04-28: qty 3

## 2024-04-28 MED ORDER — PREDNISONE 10 MG PO TABS: 40.0000 mg | ORAL_TABLET | Freq: Every day | ORAL | 0 refills | Status: AC

## 2024-04-28 NOTE — ED Notes (Signed)
 Patient states that if the xray results are not back soon she would like to leave AMA. She states that she can not get comfortable here and would be more comfortable at home. She also mentions that she feels her breathing better than when she came in. Patient encouraged to stay to receive results but she refuses. Zelaya, PA notified.

## 2024-04-28 NOTE — ED Provider Notes (Signed)
 [place] EMERGENCY DEPARTMENT AT [facility] Provider Note   CSN: [id number] Arrival date & time: 04/28/24  1515     History Chief Complaint  Patient presents with   Shortness of Breath    Sheri Rivera is a 63 y.o. female.  Patient with past history significant for COPD, tobacco use, chronic pain, malignancy of the supraglottis presents the emergency department today with concerns of shortness of breath.  She reports that she has been feeling increasing difficulty breathing for the last day.  Does not wear oxygen at baseline.  She does endorse increasing smoking since her son passed away several months ago.  She reports that she typically has COPD flareups twice per year.   Shortness of Breath      Home Medications Prior to Admission medications   Medication Sig Start Date End Date Taking? Authorizing Provider  doxycycline  (VIBRAMYCIN ) 100 MG capsule Take 1 capsule (100 mg total) by mouth 2 (two) times daily for 5 days. 04/28/24 05/03/24 Yes Rochele Lueck A, PA-C  predniSONE  (DELTASONE ) 10 MG tablet Take 4 tablets (40 mg total) by mouth daily for 5 days. 04/28/24 05/03/24 Yes Lorell Thibodaux A, PA-C  albuterol  (ACCUNEB ) 1.25 MG/3ML nebulizer solution Take 1 ampule by nebulization every 8 (eight) hours as needed for wheezing or shortness of breath. 09/05/23   [provider]  albuterol  (PROVENTIL  HFA;VENTOLIN  HFA) 108 (90 BASE) MCG/ACT inhaler Inhale 2 puffs into the lungs every 6 (six) hours as needed for shortness of breath. Shortness of breath    [provider]  fluticasone  (FLONASE ) 50 MCG/ACT nasal spray Place 1 spray into both nostrils daily. 10/13/21   [provider]  HYDROcodone -acetaminophen  (NORCO) 7.5-325 MG tablet Take 1 tablet by mouth every 6 (six) hours as needed for moderate pain (pain score 4-6). 04/13/24   Lovorn, Megan, MD  levothyroxine  (SYNTHROID ) 25 MCG tablet Take 25 mcg by mouth daily before breakfast.    [provider]   LORazepam  (ATIVAN ) 0.5 MG tablet Take 1 tab PRN extreme anxiety, not to exceed once daily. Use sparingly. Future Rx only at the discretion of the primary care provider. 11/07/18   Colie Dawes, MD  methocarbamol  (ROBAXIN ) 500 MG tablet Take 1 tablet (500 mg total) by mouth every 6 (six) hours as needed for muscle spasms. 10/03/23   Adah Acron, MD  rosuvastatin  (CRESTOR ) 10 MG tablet Take 10 mg by mouth daily. 07/02/23   [provider]  sertraline  (ZOLOFT ) 50 MG tablet Take 50 mg by mouth daily. 01/28/24   [provider]  Gaylan Kaufman 200-62.5-25 MCG/ACT AEPB Take 1 puff by mouth daily. 12/17/22   [provider]  Vitamin D , Ergocalciferol , (DRISDOL ) 1.25 MG (50000 UNIT) CAPS capsule Take 50,000 Units by mouth once a week. 07/09/23   [provider]      Allergies    Clindamycin, Cymbalta [duloxetine hcl], Levaquin  [levofloxacin  in d5w], Pregabalin, Shellfish allergy, Sulfa antibiotics, Sulfamethoxazole, and Codeine    Review of Systems   Review of Systems  Respiratory:  Positive for shortness of breath.   All other systems reviewed and are negative.   Physical Exam Updated Vital Signs BP 139/80   Pulse 98   Temp 98 F (36.7 C) (Oral)   Resp 18   SpO2 93%  Physical Exam Vitals and nursing note reviewed.  Constitutional:      General: She is not in acute distress.    Appearance: She is well-developed.  HENT:  Head: Normocephalic and atraumatic.  Eyes:     Conjunctiva/sclera: Conjunctivae normal.  Cardiovascular:     Rate and Rhythm: Normal rate and regular rhythm.     Heart sounds: No murmur heard. Pulmonary:     Effort: Pulmonary effort is normal. No respiratory distress.     Breath sounds: Wheezing present. No rhonchi or rales.  Abdominal:     Palpations: Abdomen is soft.     Tenderness: There is no abdominal tenderness.  Musculoskeletal:        General: No swelling.     Cervical back: Neck supple.  Skin:    General: Skin is  warm and dry.     Capillary Refill: Capillary refill takes less than 2 seconds.  Neurological:     Mental Status: She is alert.  Psychiatric:        Mood and Affect: Mood normal.     ED Results / Procedures / Treatments   Labs (all labs ordered are listed, but only abnormal results are displayed) Labs Reviewed  CBC WITH DIFFERENTIAL/PLATELET - Abnormal; Notable for the following components:      Result Value   Hemoglobin 15.8 (*)    HCT 47.3 (*)    Neutro Abs 8.6 (*)    Abs Immature Granulocytes 0.08 (*)    All other components within normal limits  COMPREHENSIVE METABOLIC PANEL WITH GFR - Abnormal; Notable for the following components:   Glucose, Bld 117 (*)    BUN 5 (*)    Albumin  3.4 (*)    All other components within normal limits  RESP PANEL BY RT-PCR (RSV, FLU A&B, COVID)  RVPGX2  BRAIN NATRIURETIC PEPTIDE    EKG None  Radiology DG Chest 2 View Result Date: 04/28/2024 CLINICAL DATA:  Shortness of breath and chest pain for 2 weeks. EXAM: CHEST - 2 VIEW COMPARISON:  October 22, 2022. FINDINGS: The heart size and mediastinal contours are within normal limits. Mild diffuse reticular densities are noted throughout both lungs which may represent scarring, but acute superimposed edema or atypical inflammation cannot be excluded. The visualized skeletal structures are unremarkable. IMPRESSION: Mild diffuse reticular densities are noted throughout both lungs as described above. Electronically Signed   By: Rosalene Colon M.D.   On: 04/28/2024 18:19    Procedures Procedures    Medications Ordered in ED Medications  ipratropium-albuterol  (DUONEB) 0.5-2.5 (3) MG/3ML nebulizer solution 3 mL (3 mLs Nebulization Given 04/28/24 1612)    ED Course/ Medical Decision Making/ A&P                                 Medical Decision Making Amount and/or Complexity of Data Reviewed Labs: ordered. Radiology: ordered.  Risk Prescription drug management.   This patient presents to  the ED for concern of shortness of breath.  Differential diagnosis includes COPD exacerbation, MI, bronchitis, sepsis, pneumonia, PE.   Lab Tests:  I Ordered, and personally interpreted labs.  The pertinent results include: CBC unremarkable, CMP unremarkable, BMP negative, respiratory panel negative   Imaging Studies ordered:  I ordered imaging studies including chest x-ray I independently visualized and interpreted imaging which showed Mild diffuse reticular densities are noted throughout both lungs as described above. I agree with the radiologist interpretation   Medicines ordered and prescription drug management:  I ordered medication including DuoNeb for shortness of breath, COPD Reevaluation of the patient after these medicines showed that the patient improved I have  reviewed the patients home medicines and have made adjustments as needed   Problem List / ED Course:  Patient with past history significant for COPD, tobacco use, chronic pain, malignancy of the supraglottis presents ED with concerns of shortness of breath.  Reports has been ongoing and worsening over the last day.  She does not wear oxygen at baseline does report increasing smoking since her son passed away several months ago.  She reports having a clear sputum and denies any recent fever, chills or bodyaches. Sam, patient has diffuse wheezing in all lung fields.  Suspect likely COPD.  She is wearing oxygen at this time for comfort but oxygen levels have remained stable since she had her DuoNeb and Solu-Medrol  administered by EMS. She feels better primarily due to the Solu-Medrol  that she received. With continued wheezing, repeat DuoNeb added on. Patient's lab workup is reassuring.  No significant leukocytosis.  BMP unremarkable.  Doubt CHF.  Respiratory panel negative.  Chest x-ray with some reticular densities seen that could be seen in the setting of edema possible atypical infection. When reassessed, wheezing improved  and patient requesting to leave.  In the setting of patient having a COPD flareup that is not well-controlled, will discharge patient home with a combination of oral steroids as well as doxycycline  for the next 5 days.  Patient has been able to demonstrate adequate oxygenation on room air once again.  Will discharge home with plans for strict return precautions and patient has any concerns for worsening symptoms.  Will plan on close follow-up with primary care provider.  She is otherwise at this time for outpatient follow-up and discharged home.  Final Clinical Impression(s) / ED Diagnoses Final diagnoses:  COPD exacerbation (HCC)    Rx / DC Orders ED Discharge Orders          Ordered    doxycycline  (VIBRAMYCIN ) 100 MG capsule  2 times daily        04/28/24 1847    predniSONE  (DELTASONE ) 10 MG tablet  Daily        04/28/24 1847              Maleeya Peterkin A, PA-C 04/28/24 2142    Scarlette Currier, MD 05/02/24 [phone]

## 2024-04-28 NOTE — Discharge Instructions (Signed)
 You were seen in the ER today for concerns of shortness of breath. Your labs, imaging, and exam are pointing to a COPD exacerbation. You had improvement after steroids and breathing treatments. I am sending you home with steroids and antibiotics to help manage these symptoms and prevent worsening. If symptoms do worsen, please return to the ER.

## 2024-04-28 NOTE — ED Triage Notes (Signed)
 Pt presents to the ED from home via EMS with complaints of worsening SOB since yesterday. Pt states that she has been smoking more than usual because her son recently passed away. Upon EMS arrival pts O2 on RA was 88%. Hx of COPD.  AOx4  Duoneb and solumedrol given O2 94% 3L BP 140/90 HR 110 R 22

## 2024-05-11 ENCOUNTER — Telehealth: Payer: Self-pay | Admitting: *Deleted

## 2024-05-11 ENCOUNTER — Ambulatory Visit (HOSPITAL_BASED_OUTPATIENT_CLINIC_OR_DEPARTMENT_OTHER): Admitting: Student

## 2024-05-13 ENCOUNTER — Telehealth: Payer: Self-pay | Admitting: Physical Medicine and Rehabilitation

## 2024-05-13 NOTE — Telephone Encounter (Signed)
 Patient called and said she wants to schedule for epidural injection in her back. [phone]

## 2024-05-14 ENCOUNTER — Ambulatory Visit: Admitting: Physician Assistant

## 2024-05-16 ENCOUNTER — Emergency Department (HOSPITAL_COMMUNITY)
Admission: EM | Admit: 2024-05-16 | Discharge: 2024-05-16 | Disposition: A | Discharge status: Home / Self Care | Attending: Emergency Medicine | Admitting: Emergency Medicine

## 2024-05-16 ENCOUNTER — Emergency Department (HOSPITAL_COMMUNITY)

## 2024-05-16 DIAGNOSIS — J449 Chronic obstructive pulmonary disease, unspecified: Secondary | ICD-10-CM | POA: Diagnosis not present

## 2024-05-16 DIAGNOSIS — M5441 Lumbago with sciatica, right side: Secondary | ICD-10-CM | POA: Diagnosis not present

## 2024-05-16 DIAGNOSIS — M545 Low back pain, unspecified: Secondary | ICD-10-CM | POA: Diagnosis present

## 2024-05-16 DIAGNOSIS — R109 Unspecified abdominal pain: Secondary | ICD-10-CM | POA: Insufficient documentation

## 2024-05-16 LAB — CBC WITH DIFFERENTIAL/PLATELET
Abs Immature Granulocytes: 0.03 10*3/uL (ref 0.00–0.07)
Basophils Absolute: 0.1 10*3/uL (ref 0.0–0.1)
Basophils Relative: 1 %
Eosinophils Absolute: 0.1 10*3/uL (ref 0.0–0.5)
Eosinophils Relative: 2 %
HCT: 48.7 % — ABNORMAL HIGH (ref 36.0–46.0)
Hemoglobin: 16 g/dL — ABNORMAL HIGH (ref 12.0–15.0)
Immature Granulocytes: 0 %
Lymphocytes Relative: 18 %
Lymphs Abs: 1.3 10*3/uL (ref 0.7–4.0)
MCH: 31.6 pg (ref 26.0–34.0)
MCHC: 32.9 g/dL (ref 30.0–36.0)
MCV: 96.2 fL (ref 80.0–100.0)
Monocytes Absolute: 0.6 10*3/uL (ref 0.1–1.0)
Monocytes Relative: 7 %
Neutro Abs: 5.5 10*3/uL (ref 1.7–7.7)
Neutrophils Relative %: 72 %
Platelets: 253 10*3/uL (ref 150–400)
RBC: 5.06 MIL/uL (ref 3.87–5.11)
RDW: 14.2 % (ref 11.5–15.5)
WBC: 7.6 10*3/uL (ref 4.0–10.5)
nRBC: 0 % (ref 0.0–0.2)

## 2024-05-16 LAB — BASIC METABOLIC PANEL WITH GFR
Anion gap: 9 (ref 5–15)
BUN: 8 mg/dL (ref 6–20)
CO2: 23 mmol/L (ref 22–32)
Calcium: 8.9 mg/dL (ref 8.9–10.3)
Chloride: 104 mmol/L (ref 98–111)
Creatinine, Ser: 0.37 mg/dL — ABNORMAL LOW (ref 0.44–1.00)
GFR, Estimated: >60 mL/min (ref >60)
Glucose, Bld: 94 mg/dL (ref 70–99)
Potassium: 4 mmol/L (ref 3.5–5.1)
Sodium: 136 mmol/L (ref 135–145)

## 2024-05-16 MED ORDER — IOHEXOL 300 MG/ML  SOLN
100.0000 mL | Freq: Once | INTRAMUSCULAR | Status: AC | PRN
  Administered 2024-05-16: 100 mL via INTRAVENOUS

## 2024-05-16 MED ORDER — HYDROMORPHONE HCL 1 MG/ML IJ SOLN
1.0000 mg | Freq: Once | INTRAMUSCULAR | Status: AC
  Administered 2024-05-16: 1 mg via INTRAVENOUS
  Filled 2024-05-16: qty 1

## 2024-05-16 MED ORDER — PREDNISONE 20 MG PO TABS
60.0000 mg | ORAL_TABLET | Freq: Once | ORAL | Status: AC
  Administered 2024-05-16: 60 mg via ORAL
  Filled 2024-05-16: qty 3

## 2024-05-16 MED ORDER — PREDNISONE 10 MG (21) PO TBPK: ORAL_TABLET | Freq: Every day | ORAL | 0 refills | Status: DC

## 2024-05-16 MED ORDER — OXYCODONE-ACETAMINOPHEN 5-325 MG PO TABS
1.0000 | ORAL_TABLET | Freq: Once | ORAL | Status: AC
  Administered 2024-05-16: 1 via ORAL
  Filled 2024-05-16: qty 1

## 2024-05-16 MED ORDER — OXYCODONE HCL 5 MG PO CAPS: 5.0000 mg | ORAL_CAPSULE | Freq: Four times a day (QID) | ORAL | 0 refills | Status: DC | PRN

## 2024-05-16 NOTE — Discharge Instructions (Addendum)
 You were seen in the emergency department for worsening low back pain radiating down your right leg.  You had a CAT scan of your abdomen and lumbar spine.  You had a L1 compression fracture new from a year ago.  This may or may not be contributing to your pain.  We are putting you on a steroid taper and changing your hydrocodone  to oxycodone .  Please update your pain clinic and your primary care doctor.  Follow-up with your spine doctor.  Return to the emergency department if any worsening or concerning symptoms.

## 2024-05-16 NOTE — ED Triage Notes (Signed)
 Pt arrived via EMS from home for Back pain, onset last Sunday. Saw her PCP, got injection in hip. Saw ortho no improvement. On hydrocodone  for pain management. Pain now radiates down leg.  154/70 93% RA hx copd 76HR

## 2024-05-16 NOTE — ED Provider Notes (Signed)
 [place] EMERGENCY DEPARTMENT AT [facility] Provider Note   CSN: [id number] Arrival date & time: 05/16/24  8296     Patient presents with: No chief complaint on file.   Sheri Rivera is a 63 y.o. female.  She has a history of COPD, spinal stenosis on chronic narcotics.  Usually uses a cane to ambulate.  For about a week she has had worsening low back pain radiating down her right leg to her foot.  She has had this before.  She has been using her Vicodin without improvement.  Saw her primary care doctor couple days ago he gave her an IM shot of something, probably steroids but unknown.  She has an appointment with her orthopedic doctor in a few weeks for possible spinal injection.  Has been unable to ambulate for the last few days.  Also feels she is constipated and tried a laxative without improvement.  Complaining of some crampy abdominal pain.  No urinary symptoms no incontinence.  No numbness or weakness but she does feel her right leg will shake when she tries to use it.  {Add pertinent medical, surgical, social history, OB history to YEP:67052} The history is provided by the patient.  Back Pain Location:  Lumbar spine and gluteal region Quality:  Aching Radiates to:  R posterior upper leg and R foot Pain severity:  Severe Pain is:  Same all the time Onset quality:  Gradual Duration:  6 days Timing:  Constant Progression:  Worsening Chronicity:  Recurrent Relieved by:  Nothing Worsened by:  Ambulation, bending and movement Ineffective treatments:  Narcotics Associated symptoms: abdominal pain and leg pain   Associated symptoms: no bladder incontinence, no bowel incontinence, no chest pain, no dysuria, no fever, no numbness and no weakness        Prior to Admission medications   Medication Sig Start Date End Date Taking? Authorizing Provider  albuterol  (ACCUNEB ) 1.25 MG/3ML nebulizer solution Take 1 ampule by nebulization every 8 (eight) hours as needed for  wheezing or shortness of breath. 09/05/23   [provider]  albuterol  (PROVENTIL  HFA;VENTOLIN  HFA) 108 (90 BASE) MCG/ACT inhaler Inhale 2 puffs into the lungs every 6 (six) hours as needed for shortness of breath. Shortness of breath    [provider]  fluticasone  (FLONASE ) 50 MCG/ACT nasal spray Place 1 spray into both nostrils daily. 10/13/21   [provider]  HYDROcodone -acetaminophen  (NORCO) 7.5-325 MG tablet Take 1 tablet by mouth every 6 (six) hours as needed for moderate pain (pain score 4-6). 04/13/24   Lovorn, Megan, MD  levothyroxine  (SYNTHROID ) 25 MCG tablet Take 25 mcg by mouth daily before breakfast.    [provider]  LORazepam  (ATIVAN ) 0.5 MG tablet Take 1 tab PRN extreme anxiety, not to exceed once daily. Use sparingly. Future Rx only at the discretion of the primary care provider. 11/07/18   Izell Domino, MD  methocarbamol  (ROBAXIN ) 500 MG tablet Take 1 tablet (500 mg total) by mouth every 6 (six) hours as needed for muscle spasms. 10/03/23   Barbarann Oneil BROCKS, MD  rosuvastatin  (CRESTOR ) 10 MG tablet Take 10 mg by mouth daily. 07/02/23   [provider]  sertraline  (ZOLOFT ) 50 MG tablet Take 50 mg by mouth daily. 01/28/24   [provider]  DOMINIC BECK 200-62.5-25 MCG/ACT AEPB Take 1 puff by mouth daily. 12/17/22   [provider]  Vitamin D , Ergocalciferol , (DRISDOL ) 1.25 MG (50000 UNIT) CAPS capsule Take 50,000 Units by mouth once a week.  07/09/23   [provider]    Allergies: Clindamycin, Cymbalta [duloxetine hcl], Levaquin  [levofloxacin  in d5w], Pregabalin, Shellfish allergy, Sulfa antibiotics, Sulfamethoxazole, and Codeine    Review of Systems  Constitutional:  Negative for fever.  Respiratory:  Positive for shortness of breath (baseline).   Cardiovascular:  Negative for chest pain.  Gastrointestinal:  Positive for abdominal pain. Negative for bowel incontinence.  Genitourinary:  Negative for bladder  incontinence and dysuria.  Musculoskeletal:  Positive for back pain.  Neurological:  Negative for weakness and numbness.    Updated Vital Signs BP 115/84 (BP Location: Right Arm)   Pulse 77   Temp 98.7 F (37.1 C) (Oral)   Resp 18   SpO2 93%   Physical Exam Vitals and nursing note reviewed.  Constitutional:      General: She is not in acute distress.    Appearance: Normal appearance. She is well-developed.  HENT:     Head: Normocephalic and atraumatic.   Eyes:     Conjunctiva/sclera: Conjunctivae normal.    Cardiovascular:     Rate and Rhythm: Normal rate and regular rhythm.     Heart sounds: No murmur heard. Pulmonary:     Effort: Pulmonary effort is normal. No respiratory distress.     Breath sounds: Normal breath sounds. No stridor. No wheezing.  Abdominal:     Palpations: Abdomen is soft.     Tenderness: There is no abdominal tenderness. There is no guarding or rebound.   Musculoskeletal:        General: Tenderness present. No deformity. Normal range of motion.     Cervical back: Neck supple.     Comments: She is tender lumbar right paralumbar into gluteal area.   Skin:    General: Skin is warm and dry.   Neurological:     General: No focal deficit present.     Mental Status: She is alert.     GCS: GCS eye subscore is 4. GCS verbal subscore is 5. GCS motor subscore is 6.     Sensory: No sensory deficit.     Motor: No weakness.     (all labs ordered are listed, but only abnormal results are displayed) Labs Reviewed - No data to display  EKG: None  Radiology: No results found.  {Document cardiac monitor, telemetry assessment procedure when appropriate:32947} Procedures   Medications Ordered in the ED  HYDROmorphone  (DILAUDID ) injection 1 mg (has no administration in time range)      {Click here for ABCD2, HEART and other calculators REFRESH Note before signing:1}                              Medical Decision Making Amount and/or Complexity of  Data Reviewed Labs: ordered. Radiology: ordered.  Risk Prescription drug management.   This patient complains of ***; this involves an extensive number of treatment Options and is a complaint that carries with it a high risk of complications and morbidity. The differential includes ***  I ordered, reviewed and interpreted labs, which included *** I ordered medication *** and reviewed PMP when indicated. I ordered imaging studies which included *** and I independently    visualized and interpreted imaging which showed *** Additional history obtained from *** Previous records obtained and reviewed *** I consulted *** and discussed lab and imaging findings and discussed disposition.  Cardiac monitoring reviewed, *** Social determinants considered, *** Critical Interventions: ***  After the interventions stated above, I  reevaluated the patient and found *** Admission and further testing considered, ***   {Document critical care time when appropriate  Document review of labs and clinical decision tools ie CHADS2VASC2, etc  Document your independent review of radiology images and any outside records  Document your discussion with family members, caretakers and with consultants  Document social determinants of health affecting pt's care  Document your decision making why or why not admission, treatments were needed:32947:::1}   Final diagnoses:  None    ED Discharge Orders     None

## 2024-05-16 NOTE — ED Notes (Signed)
 Pt was Given an ice pack

## 2024-05-20 ENCOUNTER — Other Ambulatory Visit: Payer: Self-pay

## 2024-05-20 ENCOUNTER — Telehealth: Payer: Self-pay | Admitting: Registered Nurse

## 2024-05-20 MED ORDER — HYDROCODONE-ACETAMINOPHEN 7.5-325 MG PO TABS: 1.0000 | ORAL_TABLET | Freq: Four times a day (QID) | ORAL | 0 refills | Status: DC | PRN

## 2024-05-20 NOTE — Telephone Encounter (Signed)
 Dr. Cornelio is out of the office. Please review.  Patient calling for refill  Hydrocodone  7.5-325 MG. She has #4 on hand.  Please advise.

## 2024-05-20 NOTE — Telephone Encounter (Signed)
 Call placed to [name], no answer.  PMP was Reviewed Sheri Rivera was in ED at [facility] on 05/16/2024 Hydrocodone  e-scribed to pharmacy.  Left message for Sheri Rivera to return the call.

## 2024-05-27 ENCOUNTER — Ambulatory Visit (HOSPITAL_BASED_OUTPATIENT_CLINIC_OR_DEPARTMENT_OTHER): Admitting: Student

## 2024-05-27 ENCOUNTER — Emergency Department (HOSPITAL_COMMUNITY)

## 2024-05-27 ENCOUNTER — Encounter (HOSPITAL_COMMUNITY): Payer: Self-pay

## 2024-05-27 ENCOUNTER — Other Ambulatory Visit: Payer: Self-pay

## 2024-05-27 ENCOUNTER — Emergency Department (HOSPITAL_COMMUNITY): Admission: EM | Admit: 2024-05-27 | Discharge: 2024-05-28 | Disposition: A | Discharge status: Home / Self Care

## 2024-05-27 DIAGNOSIS — M549 Dorsalgia, unspecified: Secondary | ICD-10-CM | POA: Insufficient documentation

## 2024-05-27 DIAGNOSIS — M5430 Sciatica, unspecified side: Secondary | ICD-10-CM | POA: Diagnosis not present

## 2024-05-27 MED ORDER — HYDROCODONE-IBUPROFEN 7.5-200 MG PO TABS: 1.0000 | ORAL_TABLET | Freq: Four times a day (QID) | ORAL | 0 refills | Status: AC | PRN

## 2024-05-27 MED ORDER — MORPHINE SULFATE (PF) 4 MG/ML IV SOLN
4.0000 mg | Freq: Once | INTRAVENOUS | Status: AC
  Administered 2024-05-27: 4 mg via INTRAMUSCULAR
  Filled 2024-05-27: qty 1

## 2024-05-27 MED ORDER — HYDROCODONE-ACETAMINOPHEN 5-325 MG PO TABS
1.0000 | ORAL_TABLET | Freq: Once | ORAL | Status: AC
  Administered 2024-05-27: 1 via ORAL
  Filled 2024-05-27: qty 1

## 2024-05-27 MED ORDER — OXYCODONE HCL 5 MG PO TABS: 5.0000 mg | ORAL_TABLET | Freq: Once | ORAL | Status: DC

## 2024-05-27 NOTE — Progress Notes (Signed)
 Orthopedic Tech Progress Note Patient Details:  Sheri Rivera 12/17/1960 [id number]  Patient ID: [name], female   DOB: [birth year], 64 y.o.   MRN: [id number] Called in for STAT order to [facility]. No ETA was given.  Jacynda Brunke L Tonantzin Mimnaugh 05/27/2024, 10:18 PM

## 2024-05-27 NOTE — ED Notes (Signed)
 Pt left for MRI.

## 2024-05-27 NOTE — Progress Notes (Signed)
 MRI reviewed, inferior endplate fracture of L1 with about 40% vertebral height loss. Recommend LSO brace when out of bed and follow up as outpatient in 2 weeks

## 2024-05-27 NOTE — ED Provider Notes (Signed)
 [place] EMERGENCY DEPARTMENT AT [facility] Provider Note   CSN: [id number] Arrival date & time: 05/27/24  8161     Patient presents with: Back Pain   Sheri Rivera is a 63 y.o. female past medical history significant for chronic back pain, fibromyalgia, arthritis, lumbar radiculopathy presents today for 3 weeks of back pain.  Patient was seen on 6/21, given steroid taper and narcotic pain meds and advised to follow-up with her PCP or orthopedic team if her symptoms persisted.  Patient states she took her pain meds with no relief today.  Patient states she is unable to walk, stand or sit up without severe pain.  Patient denies trauma, fall, loss of bowel or bladder control, numbness, saddle anesthesia, or weakness.    Back Pain      Prior to Admission medications   Medication Sig Start Date End Date Taking? Authorizing Provider  HYDROcodone -ibuprofen  (VICOPROFEN ) 7.5-200 MG tablet Take 1 tablet by mouth every 6 (six) hours as needed for up to 5 days for severe pain (pain score 7-10). 05/27/24 06/01/24 Yes Francis Ileana SAILOR, PA-C  albuterol  (ACCUNEB ) 1.25 MG/3ML nebulizer solution Take 1 ampule by nebulization every 8 (eight) hours as needed for wheezing or shortness of breath. 09/05/23   [provider]  albuterol  (PROVENTIL  HFA;VENTOLIN  HFA) 108 (90 BASE) MCG/ACT inhaler Inhale 2 puffs into the lungs every 6 (six) hours as needed for shortness of breath. Shortness of breath    [provider]  fluticasone  (FLONASE ) 50 MCG/ACT nasal spray Place 1 spray into both nostrils daily. 10/13/21   [provider]  HYDROcodone -acetaminophen  (NORCO) 7.5-325 MG tablet Take 1 tablet by mouth every 6 (six) hours as needed for moderate pain (pain score 4-6). 05/20/24   Debby Fidela CROME, NP  levothyroxine  (SYNTHROID ) 25 MCG tablet Take 25 mcg by mouth daily before breakfast.    [provider]  LORazepam  (ATIVAN ) 0.5 MG tablet Take 1 tab PRN extreme anxiety,  not to exceed once daily. Use sparingly. Future Rx only at the discretion of the primary care provider. 11/07/18   Izell Domino, MD  methocarbamol  (ROBAXIN ) 500 MG tablet Take 1 tablet (500 mg total) by mouth every 6 (six) hours as needed for muscle spasms. 10/03/23   Barbarann Oneil BROCKS, MD  predniSONE  (STERAPRED UNI-PAK 21 TAB) 10 MG (21) TBPK tablet Take by mouth daily. Take 6 tabs first day and decrease by 1 each day until finished 05/16/24   Butler, Michael C, MD  rosuvastatin  (CRESTOR ) 10 MG tablet Take 10 mg by mouth daily. 07/02/23   [provider]  sertraline  (ZOLOFT ) 50 MG tablet Take 50 mg by mouth daily. 01/28/24   [provider]  DOMINIC BECK 200-62.5-25 MCG/ACT AEPB Take 1 puff by mouth daily. 12/17/22   [provider]  Vitamin D , Ergocalciferol , (DRISDOL ) 1.25 MG (50000 UNIT) CAPS capsule Take 50,000 Units by mouth once a week. 07/09/23   [provider]    Allergies: Clindamycin, Cymbalta [duloxetine hcl], Levaquin  [levofloxacin  in d5w], Pregabalin, Shellfish allergy, Sulfa antibiotics, Sulfamethoxazole, Oxycodone , and Codeine    Review of Systems  Musculoskeletal:  Positive for back pain.    Updated Vital Signs BP (!) 147/92   Pulse 70   Temp 98.2 F (36.8 C) (Oral)   Resp 18   Wt 84.4 kg   SpO2 93%   BMI 34.02 kg/m   Physical Exam Vitals and nursing note reviewed.  Constitutional:      General: She is not in  acute distress.    Appearance: She is well-developed.     Comments: uncomfortable appearing  HENT:     Head: Normocephalic and atraumatic.     Right Ear: External ear normal.     Left Ear: External ear normal.     Mouth/Throat:     Mouth: Mucous membranes are moist.  Eyes:     Extraocular Movements: Extraocular movements intact.     Conjunctiva/sclera: Conjunctivae normal.  Cardiovascular:     Rate and Rhythm: Normal rate and regular rhythm.     Pulses: Normal pulses.     Heart sounds: Normal heart sounds. No murmur  heard. Pulmonary:     Effort: Pulmonary effort is normal. No respiratory distress.     Breath sounds: Normal breath sounds.  Abdominal:     Palpations: Abdomen is soft.     Tenderness: There is no abdominal tenderness.  Musculoskeletal:        General: No swelling.     Cervical back: Neck supple.     Comments: Patient reports tenderness to palpation of the mid lumbar spine.  No ecchymosis, step-offs, erythema, or deformity noted on exam.  +2 dorsalis pedis pulses bilaterally.  Patient is neurovascularly intact.  Skin:    General: Skin is warm and dry.     Capillary Refill: Capillary refill takes less than 2 seconds.     Findings: No bruising or erythema.  Neurological:     General: No focal deficit present.     Mental Status: She is alert and oriented to person, place, and time.     GCS: GCS eye subscore is 4. GCS verbal subscore is 5. GCS motor subscore is 6.     Cranial Nerves: Cranial nerves 2-12 are intact.     Sensory: No sensory deficit.     Motor: Motor function is intact.  Psychiatric:        Mood and Affect: Mood normal.     (all labs ordered are listed, but only abnormal results are displayed) Labs Reviewed - No data to display  EKG: None  Radiology: MR LUMBAR SPINE WO CONTRAST Result Date: 05/27/2024 CLINICAL DATA:  Compression fracture, lumbar EXAM: MRI LUMBAR SPINE WITHOUT CONTRAST TECHNIQUE: Multiplanar, multisequence MR imaging of the lumbar spine was performed. No intravenous contrast was administered. COMPARISON:  MRI lumbar spine May 27, 23. FINDINGS: Segmentation:  Standard. Alignment:  Physiologic. Vertebrae: New/interval and acute L1 inferior endplate fracture with approximately 40% height loss and involvement of the left pedicle. Approximally 3 mm of bony retropulsion without substantial canal stenosis. Chronic L3 superior endplate fracture with similar deformity and no associated marrow edema. No suspicious bone lesion. No evidence of discitis/osteomyelitis.  Conus medullaris and cauda equina: Conus extends to the L2-L3 level. Conus and cauda equina appear normal. Paraspinal and other soft tissues: Unremarkable. Disc levels: T12-L1: No significant disc protrusion, foraminal stenosis, or canal stenosis. L1-L2: Approximately 3 mm bony retropulsion along the inferior L1 endplate as detailed above. No significant canal or foraminal stenosis. L2-L3: Mild disc bulge.  No significant stenosis. L3-L4: Mild disc bulge and facet arthropathy without significant stenosis. L4-L5: Broad disc bulge with ligamentum flavum thickening and left paracentral disc protrusion. Resulting progressive (now severe) canal and severe left subarticular recess stenosis. Mild left greater than right foraminal stenosis. L5-S1: Disc bulging and endplate spurring with moderate right and mild left foraminal stenosis. Patent canal. IMPRESSION: 1. New and acute L1 inferior endplate fracture with approximately 40% height loss and involvement of the left pedicle. Approximally  3 mm of bony retropulsion without substantial canal stenosis. 2. At L4-L5, severe canal and left subarticular recess stenosis which is progressed from the prior. 3. Milder multilevel degenerative changes detailed above. 4. Chronic L3 superior endplate fracture. Electronically Signed   By: Gilmore GORMAN Molt M.D.   On: 05/27/2024 20:45     Procedures   Medications Ordered in the ED  HYDROcodone -acetaminophen  (NORCO/VICODIN) 5-325 MG per tablet 1 tablet (1 tablet Oral Given 05/27/24 2031)  morphine  (PF) 4 MG/ML injection 4 mg (4 mg Intramuscular Given 05/27/24 2147)                                    Medical Decision Making  This patient presents to the ED for concern of back pain differential diagnosis includes vertebral fracture,    Additional history obtained   Additional history obtained from Electronic Medical Record External records from outside source obtained and reviewed including orthopedic surgery  notes  Imaging Studies ordered:  I ordered imaging studies including MRI lumbar spine Noncon I independently visualized and interpreted imaging which showed new and acute L1 inferior endplate fracture with approximately 40% height loss and involvement of the left pedicle.  Approximately 3 mm of bony retropulsion without substantial canal stenosis.  L4-L5 severe canal and left subarticular recess stenosis which is progressed from prior.  I agree with the radiologist interpretation   Medicines ordered and prescription drug management:  I ordered medication including Percocet, morphine     I have reviewed the patients home medicines and have made adjustments as needed   Problem List / ED Course:  Consulted neurosurgery, Luke Pean NP who recommended LSO and follow-up in neurosurgery clinic in the upcoming weeks. Considered for admission or further workup however patient's vital signs, physical exam, and imaging are reassuring.  Patient placed in LSO as recommended by neurosurgery.  Patient given course of narcotic pain meds and advised to follow-up with neurosurgery.  Patient given return precautions.  I feel patient is safe for discharge at this time.        Final diagnoses:  Back pain with sciatica    ED Discharge Orders          Ordered    HYDROcodone -ibuprofen  (VICOPROFEN ) 7.5-200 MG tablet  Every 6 hours PRN        05/27/24 2235               Francis Ileana SAILOR, PA-C 05/27/24 2327    Ula Prentice SAUNDERS, MD 05/27/24 2337

## 2024-05-27 NOTE — ED Triage Notes (Signed)
 BIB EMS/ c/o back pain x3 weeks/ mid lumbar/ took pain meds no relief/ worse today/ unable to walk,stand or sit-up/ 154/88/ 90HR/ 94%/20RR/ A&OX4

## 2024-05-27 NOTE — Discharge Instructions (Addendum)
 Today you were seen for low back pain.  Please follow-up with neurosurgery for further evaluation and workup.  Please pick up your medication and take as needed for severe pain.  Thank you for letting us  treat you today. After reviewing your imaging, I feel you are safe to go home. Please follow up with your PCP in the next several days and provide them with your records from this visit. Return to the Emergency Room if pain becomes severe or symptoms worsen.

## 2024-06-01 ENCOUNTER — Encounter: Attending: Registered Nurse | Admitting: Physical Medicine and Rehabilitation

## 2024-06-01 DIAGNOSIS — M48062 Spinal stenosis, lumbar region with neurogenic claudication: Secondary | ICD-10-CM | POA: Insufficient documentation

## 2024-06-01 DIAGNOSIS — G894 Chronic pain syndrome: Secondary | ICD-10-CM | POA: Insufficient documentation

## 2024-06-01 DIAGNOSIS — Z79891 Long term (current) use of opiate analgesic: Secondary | ICD-10-CM | POA: Insufficient documentation

## 2024-06-01 DIAGNOSIS — G8929 Other chronic pain: Secondary | ICD-10-CM | POA: Insufficient documentation

## 2024-06-01 DIAGNOSIS — Z5181 Encounter for therapeutic drug level monitoring: Secondary | ICD-10-CM | POA: Insufficient documentation

## 2024-06-01 DIAGNOSIS — M5416 Radiculopathy, lumbar region: Secondary | ICD-10-CM | POA: Insufficient documentation

## 2024-06-01 DIAGNOSIS — M25551 Pain in right hip: Secondary | ICD-10-CM | POA: Insufficient documentation

## 2024-06-01 DIAGNOSIS — M797 Fibromyalgia: Secondary | ICD-10-CM | POA: Insufficient documentation

## 2024-06-03 ENCOUNTER — Ambulatory Visit: Admitting: Physical Medicine and Rehabilitation

## 2024-06-12 ENCOUNTER — Telehealth: Payer: Self-pay

## 2024-06-16 ENCOUNTER — Other Ambulatory Visit: Payer: Self-pay | Admitting: Student

## 2024-06-16 DIAGNOSIS — S32010A Wedge compression fracture of first lumbar vertebra, initial encounter for closed fracture: Secondary | ICD-10-CM

## 2024-06-16 DIAGNOSIS — M5416 Radiculopathy, lumbar region: Secondary | ICD-10-CM

## 2024-06-19 ENCOUNTER — Other Ambulatory Visit: Payer: Self-pay | Admitting: Interventional Radiology

## 2024-06-19 ENCOUNTER — Telehealth: Payer: Self-pay | Admitting: Registered Nurse

## 2024-06-19 ENCOUNTER — Ambulatory Visit
Admission: RE | Admit: 2024-06-19 | Discharge: 2024-06-19 | Disposition: A | Discharge status: Home / Self Care | Source: Ambulatory Visit | Attending: Student | Admitting: Student

## 2024-06-19 DIAGNOSIS — M8008XA Age-related osteoporosis with current pathological fracture, vertebra(e), initial encounter for fracture: Secondary | ICD-10-CM

## 2024-06-19 DIAGNOSIS — S32010A Wedge compression fracture of first lumbar vertebra, initial encounter for closed fracture: Secondary | ICD-10-CM

## 2024-06-19 DIAGNOSIS — M5416 Radiculopathy, lumbar region: Secondary | ICD-10-CM

## 2024-06-19 HISTORY — PX: IR RADIOLOGIST EVAL & MGMT: IMG5224

## 2024-06-19 MED ORDER — HYDROCODONE-ACETAMINOPHEN 7.5-325 MG PO TABS: 1.0000 | ORAL_TABLET | Freq: Four times a day (QID) | ORAL | 0 refills | Status: DC | PRN

## 2024-06-19 NOTE — Telephone Encounter (Signed)
 Asked pharmacy to fill meds- today- since pt getting Kyphoplasty today for compression fx she sustained.   Lives alone and son died last year who helped her- 2 grown daughters don't help.   Getting surgery for spinal stenosis- next month- by maybe Dr Debby, but [facility] next door- But getting done in August.   Right now having to go to appointment by stretcher, because pain is so bad.   Still always on couch- no quality of life- has to rehome her [place]- make loneliness worse, and now has to give up her dogs.  Has depended on Next door app-  trying to get CAP but was told it's 6 months-  Will try to see us  in September-   Had appt 7/7- missed because at another doctor office.   Ask for telehealth appt- ask pt to call back and have front desk schedule- in the next 30 days.   Has been needed to be taken by stretcher to appointments for the last 2 months due to compression frx and severe spinal stenosis.   Make 2 appointments- one with me in October and one with [facility] in next 30 days.

## 2024-06-19 NOTE — Telephone Encounter (Signed)
 The patient has contacted the pharmacy and was informed that she will not be able to pick up her medication until 06/20/24, regardless of whether we call it in today, due to her last fill being on 05/21/24. The patient is requesting to speak with someone regarding potential symptoms she may experience, as she will be off her medication for 24 hours. Her last dose was taken at 11 PM on 06/18/24.

## 2024-06-19 NOTE — Telephone Encounter (Signed)
 Patient is calling back about refill on hydrocodone .  She left a message last week but hasn't gotten a response.  Please call patient at [phone].

## 2024-06-19 NOTE — Consult Note (Signed)
 Chief Complaint: Patient was seen in consultation today for low back pain  Referring Physician(s): Meyran,Kimberly Chiquita (Neurosurgery)  Supervising Physician: Karalee Beat  History of Present Illness: Sheri Rivera is a 63 y.o. female with past medical history significant for anxiety, depression, fibromyalgia, DDD, COPD, history of laryngeal cancer s/p radiation, lumbar radiculopathy, arthritis who presents today for evaluation of low back pain/new L1 compression fracture.   Sheri Rivera presented to the ED on 05/16/24 with complaints of worsening low back pain x 1 week which was not relieved with her usual chronic opiate medications. She denied any recent injury or trauma. She underwent CT of the lumbar spine which showed:  1. Concavity of the L1 inferior endplate with query underlying mild acute compression fracture. Correlate with point tenderness to palpation to evaluate for an acute component. 2.  Aortic Atherosclerosis.  After adjustment of her pain medications she felt improved enough to return home with outpatient follow up. After returning home her pain continued to worsen and was no longer relieved by the new regimen of pain medications or a steroid taper. She re-presented to the ED on 05/27/24 with complaints of low back pain which had become so severe that she was unable to walk, stand or sit without severe pain. She underwent MRI of the lumbar spine during that visit which showed:  1. New and acute L1 inferior endplate fracture with approximately 40% height loss and involvement of the left pedicle. Approximally 3 mm of bony retropulsion without substantial canal stenosis. 2. At L4-L5, severe canal and left subarticular recess stenosis which is progressed from the prior. 3. Milder multilevel degenerative changes detailed above. 4. Chronic L3 superior endplate fracture.   She was seen by neurosurgery who recommended LSO and outpatient follow up with their  service in about 2 weeks. She has subsequently been referred to IR clinic by neurosurgery to discuss possible L1 kyphoplasty. She presents to the IR outpatient clinic accompanied by a friend. She endorses being severely limited in her ability to care for herself and her three dogs. She needs assistance with most activities of daily living. She rates her pain a 10/10 and is very tearful about her quality of life. She scored a 22/24 on the [employer] Disability Scale. She currently has a back brace in place which she states does very little to provide relief. She is dependent upon a walker to ambulate.   Past Medical History:  Diagnosis Date   Anxiety 11/11/2011   Aortic atherosclerosis (HCC)    Arthritis    Asthma    Bursitis    right shoulder   Chronic back pain    Chronic neck pain    Chronic shoulder pain    COPD (chronic obstructive pulmonary disease) (HCC)    continues to smoke 1ppd   DDD (degenerative disc disease)    Depression    Fibromyalgia    GERD (gastroesophageal reflux disease)    Heart murmur    as a child per pt   Herpes    History of hiatal hernia    repaired   History of laryngeal cancer    History of radiation therapy 08/12/18- 10/03/18   Larynx, prescribed dose of 63 Gy in 28 fractions. She chose to stop treatments after 27 fractions.    Hypothyroidism    IBS (irritable bowel syndrome)    Lumbar radiculopathy    Seasonal allergies    Smoker    Thyroid  nodule    multinodular thyroid  goiter  Past Surgical History:  Procedure Laterality Date   CHOLECYSTECTOMY     DIRECT LARYNGOSCOPY N/A 07/11/2018   Procedure: DIRECT LARYNGOSCOPY WITH BIOPSY;  Surgeon: Ethyl Lonni BRAVO, MD;  Location: [facility];  Service: ENT;  Laterality: N/A;   HIATAL HERNIA REPAIR     TOTAL HIP ARTHROPLASTY Right 10/02/2023   Procedure: RIGHT TOTAL HIP ARTHROPLASTY ANTERIOR APPROACH;  Surgeon: Barbarann Oneil BROCKS, MD;  Location: [facility] OR;  Service: Orthopedics;  Laterality:  Right;  NEEDS RNFA   TUBAL LIGATION  11/26/1981    Allergies: Clindamycin, Cymbalta [duloxetine hcl], Levaquin  [levofloxacin  in d5w], Pregabalin, Shellfish allergy, Sulfa antibiotics, Sulfamethoxazole, Oxycodone , and Codeine  Medications: Prior to Admission medications   Medication Sig Start Date End Date Taking? Authorizing Provider  albuterol  (ACCUNEB ) 1.25 MG/3ML nebulizer solution Take 1 ampule by nebulization every 8 (eight) hours as needed for wheezing or shortness of breath. 09/05/23   [provider]  albuterol  (PROVENTIL  HFA;VENTOLIN  HFA) 108 (90 BASE) MCG/ACT inhaler Inhale 2 puffs into the lungs every 6 (six) hours as needed for shortness of breath. Shortness of breath    [provider]  fluticasone  (FLONASE ) 50 MCG/ACT nasal spray Place 1 spray into both nostrils daily. 10/13/21   [provider]  HYDROcodone -acetaminophen  (NORCO) 7.5-325 MG tablet Take 1 tablet by mouth every 6 (six) hours as needed for moderate pain (pain score 4-6). 06/19/24   Lovorn, Megan, MD  levothyroxine  (SYNTHROID ) 25 MCG tablet Take 25 mcg by mouth daily before breakfast.    [provider]  LORazepam  (ATIVAN ) 0.5 MG tablet Take 1 tab PRN extreme anxiety, not to exceed once daily. Use sparingly. Future Rx only at the discretion of the primary care provider. 11/07/18   Izell Domino, MD  methocarbamol  (ROBAXIN ) 500 MG tablet Take 1 tablet (500 mg total) by mouth every 6 (six) hours as needed for muscle spasms. 10/03/23   Barbarann Oneil BROCKS, MD  predniSONE  (STERAPRED UNI-PAK 21 TAB) 10 MG (21) TBPK tablet Take by mouth daily. Take 6 tabs first day and decrease by 1 each day until finished 05/16/24   Butler, Michael C, MD  rosuvastatin  (CRESTOR ) 10 MG tablet Take 10 mg by mouth daily. 07/02/23   [provider]  sertraline  (ZOLOFT ) 50 MG tablet Take 50 mg by mouth daily. 01/28/24   [provider]  DOMINIC BECK 200-62.5-25 MCG/ACT AEPB Take 1 puff by mouth daily.  12/17/22   [provider]  Vitamin D , Ergocalciferol , (DRISDOL ) 1.25 MG (50000 UNIT) CAPS capsule Take 50,000 Units by mouth once a week. 07/09/23   [provider]     Family History  Problem Relation Age of Onset   Lung cancer Mother    Arthritis Mother        Rheumatoid   Bursitis Mother    Asthma Mother    Heart attack Father 54       Died of MI   Obesity Father    Fibromyalgia Sister    Asthma Child     Social History   Socioeconomic History   Marital status: Divorced    Spouse name: Not on file   Number of children: 1   Years of education: Not on file   Highest education level: Not on file  Occupational History    Employer: [employer]  Tobacco Use   Smoking status: Every Day    Current packs/day: 1.00    Average packs/day: 1 pack/day for 45.6 years (45.6 ttl pk-yrs)    Types:  Cigarettes    Start date: 07/18/1979   Smokeless tobacco: Never   Tobacco comments:    She is smoking 1-1.5 ppd 09/27/23  Vaping Use   Vaping status: Never Used  Substance and Sexual Activity   Alcohol use: No   Drug use: No   Sexual activity: Not on file  Other Topics Concern   Not on file  Social History Narrative   She lives by herself. Pt has 1 biological daughter and 1 adopted daughter. Biological son passed away 07-18-23   Social Drivers of Health   Financial Resource Strain: Not on file  Food Insecurity: No Food Insecurity (10/02/2023)   Hunger Vital Sign    Worried About Running Out of Food in the Last Year: Never true    Ran Out of Food in the Last Year: Never true  Transportation Needs: No Transportation Needs (10/02/2023)   PRAPARE - Administrator, Civil Service (Medical): No    Lack of Transportation (Non-Medical): No  Physical Activity: Not on file  Stress: Not on file  Social Connections: Unknown (05/16/2022)   Received from [facility]   Social Network    Social Network: Not on file     Review of Systems: A 12 point ROS discussed and  pertinent positives are indicated in the HPI above.  All other systems are negative.  Review of Systems  Musculoskeletal:  Positive for arthralgias, back pain, gait problem and myalgias.  All other systems reviewed and are negative.   Vital Signs: BP (!) 142/81   Pulse 72   Temp 97.8 F (36.6 C)   Resp 16   SpO2 94% Comment: room air  Physical Exam Constitutional:      General: She is not in acute distress.    Appearance: She is not ill-appearing.  Cardiovascular:     Rate and Rhythm: Normal rate.  Pulmonary:     Effort: Pulmonary effort is normal.  Abdominal:     Tenderness: There is no abdominal tenderness.  Musculoskeletal:        General: Tenderness and signs of injury present.       Back:     Comments: Extreme tenderness to palpation in her lower back.   Skin:    General: Skin is warm and dry.  Neurological:     Mental Status: She is alert and oriented to person, place, and time.  Psychiatric:        Mood and Affect: Affect is tearful.     Imaging: MR LUMBAR SPINE WO CONTRAST Result Date: 05/27/2024 CLINICAL DATA:  Compression fracture, lumbar EXAM: MRI LUMBAR SPINE WITHOUT CONTRAST TECHNIQUE: Multiplanar, multisequence MR imaging of the lumbar spine was performed. No intravenous contrast was administered. COMPARISON:  MRI lumbar spine May 27, 23. FINDINGS: Segmentation:  Standard. Alignment:  Physiologic. Vertebrae: New/interval and acute L1 inferior endplate fracture with approximately 40% height loss and involvement of the left pedicle. Approximally 3 mm of bony retropulsion without substantial canal stenosis. Chronic L3 superior endplate fracture with similar deformity and no associated marrow edema. No suspicious bone lesion. No evidence of discitis/osteomyelitis. Conus medullaris and cauda equina: Conus extends to the L2-L3 level. Conus and cauda equina appear normal. Paraspinal and other soft tissues: Unremarkable. Disc levels: T12-L1: No significant disc  protrusion, foraminal stenosis, or canal stenosis. L1-L2: Approximately 3 mm bony retropulsion along the inferior L1 endplate as detailed above. No significant canal or foraminal stenosis. L2-L3: Mild disc bulge.  No significant stenosis. L3-L4: Mild disc bulge and  facet arthropathy without significant stenosis. L4-L5: Broad disc bulge with ligamentum flavum thickening and left paracentral disc protrusion. Resulting progressive (now severe) canal and severe left subarticular recess stenosis. Mild left greater than right foraminal stenosis. L5-S1: Disc bulging and endplate spurring with moderate right and mild left foraminal stenosis. Patent canal. IMPRESSION: 1. New and acute L1 inferior endplate fracture with approximately 40% height loss and involvement of the left pedicle. Approximally 3 mm of bony retropulsion without substantial canal stenosis. 2. At L4-L5, severe canal and left subarticular recess stenosis which is progressed from the prior. 3. Milder multilevel degenerative changes detailed above. 4. Chronic L3 superior endplate fracture. Electronically Signed   By: Gilmore GORMAN Molt M.D.   On: 05/27/2024 20:45    Labs:  CBC: Recent Labs    09/27/23 1500 10/03/23 0758 04/28/24 1617 05/16/24 1752  WBC 9.4 14.2* 10.2 7.6  HGB 15.7* 12.1 15.8* 16.0*  HCT 46.9* 36.4 47.3* 48.7*  PLT 273 266 315 253    COAGS: No results for input(s): INR, APTT in the last 8760 hours.  BMP: Recent Labs    09/27/23 1500 04/28/24 1617 05/16/24 1752  NA 137 138 136  K 3.7 3.5 4.0  CL 101 108 104  CO2 25 22 23   GLUCOSE 96 117* 94  BUN 9 5* 8  CALCIUM  9.4 9.2 8.9  CREATININE 0.53 0.46 0.37*  GFRNONAA >60 >60 >60    LIVER FUNCTION TESTS: Recent Labs    04/28/24 1617  BILITOT 0.4  AST 18  ALT 12  ALKPHOS 106  PROT 7.0  ALBUMIN  3.4*    TUMOR MARKERS: No results for input(s): AFPTM, CEA, CA199, CHROMGRNA in the last 8760 hours.  Assessment:  63 y/o F with history of  persistent low back pain found to have acute L1 compression fracture on recent MRI who presents to IR clinic today to discuss possible L1 kyphoplasty.  She met with Dr. Karalee and they discussed her past history with back pain. She has long standing history of back pain and spinal stenosis and is followed by a pain clinic. This most recent onset of back pain is worse than she's ever experienced and she is motivated to pursue any possibly treatment option that will provide her with some relief.   Dr. Karalee discussed the risks, benefits, alternatives and procedural expectations with vertebral body augmentation. She expressed a desire to proceed as soon as possible.  Plan:  - L1 kyphoplasty at [facility] or [facility] - first available opening.    Warren Dais, AGACNP-BC 06/19/2024, 12:48 PM    Please refer to Dr. Jacquelin attestation of this note for management and plan.

## 2024-06-22 ENCOUNTER — Telehealth: Payer: Self-pay

## 2024-06-22 NOTE — Discharge Instructions (Signed)

## 2024-06-23 ENCOUNTER — Inpatient Hospital Stay
Admission: RE | Admit: 2024-06-23 | Discharge: 2024-06-23 | Disposition: A | Discharge status: Home / Self Care | Source: Ambulatory Visit | Attending: Interventional Radiology | Admitting: Interventional Radiology

## 2024-06-23 DIAGNOSIS — M8008XA Age-related osteoporosis with current pathological fracture, vertebra(e), initial encounter for fracture: Secondary | ICD-10-CM

## 2024-06-23 HISTORY — PX: IR KYPHO LUMBAR INC FX REDUCE BONE BX UNI/BIL CANNULATION INC/IMAGING: IMG5519

## 2024-06-23 MED ORDER — MIDAZOLAM HCL 2 MG/2ML IJ SOLN: 1.0000 mg | INTRAMUSCULAR | Status: DC | PRN

## 2024-06-23 MED ORDER — FENTANYL CITRATE PF 50 MCG/ML IJ SOSY: 25.0000 ug | PREFILLED_SYRINGE | INTRAMUSCULAR | Status: DC | PRN

## 2024-06-23 MED ORDER — SODIUM CHLORIDE 0.9 % IV SOLN: INTRAVENOUS | Status: DC

## 2024-06-23 MED ORDER — SODIUM CHLORIDE 0.9 % IV SOLN
8.0000 mg | Freq: Once | INTRAVENOUS | Status: AC
  Administered 2024-06-23: 8 mg via INTRAVENOUS

## 2024-06-23 MED ORDER — MIDAZOLAM HCL 2 MG/2ML IJ SOLN
INTRAMUSCULAR | Status: AC | PRN
Start: 2024-06-23 — End: 2024-06-23
  Administered 2024-06-23: 1 mg via INTRAVENOUS

## 2024-06-23 MED ORDER — ACETAMINOPHEN 10 MG/ML IV SOLN
1000.0000 mg | Freq: Once | INTRAVENOUS | Status: AC
  Administered 2024-06-23: 1000 mg via INTRAVENOUS

## 2024-06-23 MED ORDER — CEFAZOLIN SODIUM-DEXTROSE 2-4 GM/100ML-% IV SOLN
2.0000 g | INTRAVENOUS | Status: AC
  Administered 2024-06-23: 2 g via INTRAVENOUS

## 2024-06-23 MED ORDER — FENTANYL CITRATE (PF) 100 MCG/2ML IJ SOLN
INTRAMUSCULAR | Status: AC | PRN
  Administered 2024-06-23: 25 ug via INTRAVENOUS
  Administered 2024-06-23: 50 ug via INTRAVENOUS

## 2024-06-24 ENCOUNTER — Telehealth: Payer: Self-pay

## 2024-06-25 ENCOUNTER — Telehealth: Payer: Self-pay

## 2024-06-30 ENCOUNTER — Other Ambulatory Visit: Payer: Self-pay | Admitting: Interventional Radiology

## 2024-06-30 ENCOUNTER — Ambulatory Visit
Admission: RE | Admit: 2024-06-30 | Discharge: 2024-06-30 | Disposition: A | Discharge status: Home / Self Care | Source: Ambulatory Visit | Attending: Interventional Radiology | Admitting: Interventional Radiology

## 2024-06-30 ENCOUNTER — Telehealth: Payer: Self-pay

## 2024-06-30 DIAGNOSIS — Z9889 Other specified postprocedural states: Secondary | ICD-10-CM

## 2024-06-30 DIAGNOSIS — M545 Low back pain, unspecified: Secondary | ICD-10-CM

## 2024-07-02 ENCOUNTER — Emergency Department (HOSPITAL_COMMUNITY)
Admission: EM | Admit: 2024-07-02 | Discharge: 2024-07-02 | Discharge status: Against Medical Advice | Attending: Emergency Medicine | Admitting: Emergency Medicine

## 2024-07-02 ENCOUNTER — Encounter (HOSPITAL_COMMUNITY): Payer: Self-pay

## 2024-07-02 ENCOUNTER — Other Ambulatory Visit: Payer: Self-pay

## 2024-07-02 DIAGNOSIS — R350 Frequency of micturition: Secondary | ICD-10-CM | POA: Diagnosis not present

## 2024-07-02 DIAGNOSIS — M545 Low back pain, unspecified: Secondary | ICD-10-CM | POA: Insufficient documentation

## 2024-07-02 LAB — URINALYSIS, ROUTINE W REFLEX MICROSCOPIC
Bilirubin Urine: NEGATIVE
Glucose, UA: NEGATIVE mg/dL
Hgb urine dipstick: NEGATIVE
Ketones, ur: NEGATIVE mg/dL
Leukocytes,Ua: NEGATIVE
Nitrite: POSITIVE — AB
Protein, ur: NEGATIVE mg/dL
Specific Gravity, Urine: 1.005 (ref 1.005–1.030)
pH: 6 (ref 5.0–8.0)

## 2024-07-02 LAB — CBC WITH DIFFERENTIAL/PLATELET
Abs Immature Granulocytes: 0.05 K/uL (ref 0.00–0.07)
Basophils Absolute: 0.1 K/uL (ref 0.0–0.1)
Basophils Relative: 1 %
Eosinophils Absolute: 0.1 K/uL (ref 0.0–0.5)
Eosinophils Relative: 1 %
HCT: 46.8 % — ABNORMAL HIGH (ref 36.0–46.0)
Hemoglobin: 15 g/dL (ref 12.0–15.0)
Immature Granulocytes: 1 %
Lymphocytes Relative: 13 %
Lymphs Abs: 1.4 K/uL (ref 0.7–4.0)
MCH: 31.1 pg (ref 26.0–34.0)
MCHC: 32.1 g/dL (ref 30.0–36.0)
MCV: 97.1 fL (ref 80.0–100.0)
Monocytes Absolute: 0.7 K/uL (ref 0.1–1.0)
Monocytes Relative: 6 %
Neutro Abs: 7.9 K/uL — ABNORMAL HIGH (ref 1.7–7.7)
Neutrophils Relative %: 78 %
Platelets: 279 K/uL (ref 150–400)
RBC: 4.82 MIL/uL (ref 3.87–5.11)
RDW: 14.9 % (ref 11.5–15.5)
WBC: 10.1 K/uL (ref 4.0–10.5)
nRBC: 0 % (ref 0.0–0.2)

## 2024-07-02 LAB — BASIC METABOLIC PANEL WITH GFR
Anion gap: 11 (ref 5–15)
BUN: 7 mg/dL (ref 6–20)
CO2: 22 mmol/L (ref 22–32)
Calcium: 9 mg/dL (ref 8.9–10.3)
Chloride: 104 mmol/L (ref 98–111)
Creatinine, Ser: 0.46 mg/dL (ref 0.44–1.00)
GFR, Estimated: >60 mL/min (ref >60)
Glucose, Bld: 88 mg/dL (ref 70–99)
Potassium: 4.6 mmol/L (ref 3.5–5.1)
Sodium: 137 mmol/L (ref 135–145)

## 2024-07-02 MED ORDER — CEPHALEXIN 500 MG PO CAPS: 500.0000 mg | ORAL_CAPSULE | Freq: Two times a day (BID) | ORAL | 0 refills | Status: AC

## 2024-07-02 MED ORDER — HYDROMORPHONE HCL 1 MG/ML IJ SOLN
1.0000 mg | Freq: Once | INTRAMUSCULAR | Status: AC
  Administered 2024-07-02: 1 mg via INTRAVENOUS
  Filled 2024-07-02: qty 1

## 2024-07-02 MED ORDER — DEXAMETHASONE SODIUM PHOSPHATE 10 MG/ML IJ SOLN
10.0000 mg | Freq: Once | INTRAMUSCULAR | Status: AC
  Administered 2024-07-02: 10 mg via INTRAVENOUS
  Filled 2024-07-02: qty 1

## 2024-07-02 MED ORDER — PREDNISONE 10 MG (21) PO TBPK: ORAL_TABLET | ORAL | 0 refills | Status: AC

## 2024-07-02 NOTE — ED Notes (Signed)
 Pt ambulated to restroom with cane.  Pt reports urinary incontinence yesterday and today.  Urine sample obtained.

## 2024-07-02 NOTE — ED Provider Notes (Addendum)
 [facility]ERGENCY DEPARTMENT AT [facility] Provider Note   CSN: [id number] Arrival date & time: 07/02/24  1239     Patient presents with: Back Pain   Sheri Rivera is a 63 y.o. female.   -Had recent kyphoplasty of L1 on Tuesday  -Has been taking Norco regularly  -L lower back pain starting Sunday, felt something shift -Progressively increasing until difficult to walk today  -No numbness or pain shooting down legs  -No recent fall or injury -Reports some increased urinary frequency, no dysuria or hematuria -No incontinence, reports normal control of bladder, just sometimes did not make it to the bathroom in time      Prior to Admission medications   Medication Sig Start Date End Date Taking? Authorizing Provider  cephALEXin  (KEFLEX ) 500 MG capsule Take 1 capsule (500 mg total) by mouth 2 (two) times daily. 07/02/24  Yes Diona Perkins, MD  predniSONE  (STERAPRED UNI-PAK 21 TAB) 10 MG (21) TBPK tablet Take 6 tabs on day 1, then 5 tabs on day 2, 4 tabs on day 3, 3 tab on day 4, 2 tabs on day 5, 1 tab on day 6 07/02/24  Yes Diona Perkins, MD  albuterol  (ACCUNEB ) 1.25 MG/3ML nebulizer solution Take 1 ampule by nebulization every 8 (eight) hours as needed for wheezing or shortness of breath. 09/05/23   [provider]  albuterol  (PROVENTIL  HFA;VENTOLIN  HFA) 108 (90 BASE) MCG/ACT inhaler Inhale 2 puffs into the lungs every 6 (six) hours as needed for shortness of breath. Shortness of breath    [provider]  fluticasone  (FLONASE ) 50 MCG/ACT nasal spray Place 1 spray into both nostrils daily. 10/13/21   [provider]  HYDROcodone -acetaminophen  (NORCO) 7.5-325 MG tablet Take 1 tablet by mouth every 6 (six) hours as needed for moderate pain (pain score 4-6). 06/19/24   Lovorn, Megan, MD  levothyroxine  (SYNTHROID ) 25 MCG tablet Take 25 mcg by mouth daily before breakfast.    [provider]  LORazepam  (ATIVAN ) 0.5 MG tablet Take 1 tab PRN extreme  anxiety, not to exceed once daily. Use sparingly. Future Rx only at the discretion of the primary care provider. 11/07/18   Izell Domino, MD  methocarbamol  (ROBAXIN ) 500 MG tablet Take 1 tablet (500 mg total) by mouth every 6 (six) hours as needed for muscle spasms. 10/03/23   Barbarann Oneil BROCKS, MD  rosuvastatin  (CRESTOR ) 10 MG tablet Take 10 mg by mouth daily. 07/02/23   [provider]  sertraline  (ZOLOFT ) 50 MG tablet Take 50 mg by mouth daily. 01/28/24   [provider]  DOMINIC BECK 200-62.5-25 MCG/ACT AEPB Take 1 puff by mouth daily. 12/17/22   [provider]  Vitamin D , Ergocalciferol , (DRISDOL ) 1.25 MG (50000 UNIT) CAPS capsule Take 50,000 Units by mouth once a week. 07/09/23   [provider]    Allergies: Clindamycin, Cymbalta [duloxetine hcl], Levaquin  [levofloxacin  in d5w], Pregabalin, Shellfish allergy, Sulfa antibiotics, Sulfamethoxazole, Oxycodone , and Codeine    Review of Systems  Updated Vital Signs BP (!) 124/91   Pulse (!) 104   Temp (!) 97.4 F (36.3 C) (Oral)   Resp 18   Ht 5' 2 (1.575 m)   Wt 85 kg   SpO2 90%   BMI 34.27 kg/m   Physical Exam Cardiovascular:     Rate and Rhythm: Regular rhythm.     Heart sounds: Normal heart sounds.  Pulmonary:     Effort: Pulmonary effort is normal.     Breath sounds: Normal  breath sounds.  Musculoskeletal:     Comments: Tenderness to palpation of L lower back soft tissue. Spinal tenderness of L spine.   Neurological:     Comments: Alert and oriented. Moves all extremities spontaneously. Extremity strength and sensation intact bilaterally.      (all labs ordered are listed, but only abnormal results are displayed) Labs Reviewed  URINALYSIS, ROUTINE W REFLEX MICROSCOPIC - Abnormal; Notable for the following components:      Result Value   Color, Urine STRAW (*)    APPearance HAZY (*)    Nitrite POSITIVE (*)    Bacteria, UA MANY (*)    All other components within normal limits  CBC WITH  DIFFERENTIAL/PLATELET - Abnormal; Notable for the following components:   HCT 46.8 (*)    Neutro Abs 7.9 (*)    All other components within normal limits  BASIC METABOLIC PANEL WITH GFR    EKG: None  Radiology: No results found.   Medications Ordered in the ED  HYDROmorphone  (DILAUDID ) injection 1 mg (1 mg Intravenous Given 07/02/24 1457)  dexamethasone  (DECADRON ) injection 10 mg (10 mg Intravenous Given 07/02/24 1548)    Medical Decision Making Amount and/or Complexity of Data Reviewed Labs: ordered.  Risk Prescription drug management.   Sheri Rivera is a 63 y.o. female is here with uncontrollable back pain.   Per chart review of [facility] 7/17, Acute compression fracture of L1Chronic from stenosis at L4-5 with a slight spondylolisthesis which is not mobile on flexion-extensionWe discussed all of her options. We are going to start with a kyphoplasty at L1 to see if this gives her any relief. If it does not then we will consider operating on L4-5. Follow-up a couple weeks after kyphoplasty   Lab Tests:  CBC unremarkable  BMP pending UA: +nitrite, many bacteria   Medicines ordered:  Dilaudid  x1 Decadron  10  Plan Patient presenting for lower back pain following recent L1 kyphoplasty. Dilaudid  x1 given for pain. Contacted radiology room for read of recent lumbar MRI (06/30/24). Consulted neurosurgery.  Addendum 1519: Lumbar MRI without acute findings. Spoke with Neurosurgery on call who recommend 10 of decadron  here in ED and discharge home with steroid pack, needs to be seen in clinic to further discuss surgical options. Decadron  10mg  x1 ordered, plan for prednisone  taper pack on discharge.   Addendum 1524: Given urinary sx and UA suggestive of infection, plan for Keflex  on discharge for UTI. Patient care signed out to oncoming ED team. Pending BMP.   Final diagnoses:  Acute left-sided low back pain, unspecified whether sciatica present       Diona Perkins, MD 07/02/24 1535    Diona Perkins, MD 07/02/24 1538    Diona Perkins, MD 07/02/24 1553    Tegeler, Lonni PARAS, MD 07/08/24 [phone]

## 2024-07-02 NOTE — ED Triage Notes (Addendum)
 Patient BIB [facility] from home. Has had lower back pain for 4 days that has worsened. Recently had back surgery of L1 on Tuesday. Pain got better for 2 days then creeped up again. Took prescribed hydrocodone  today with no relief and said she smoked a lot of cigarettes today but the pain is still too severe.

## 2024-07-02 NOTE — ED Notes (Signed)
 Pt reports monitoring equipment makes her nervous and declines being hooked up to anything at this time.

## 2024-07-06 NOTE — Progress Notes (Deleted)
 Subjective:    Patient ID: Sheri Rivera, female    DOB: Aug 29, 1961, 63 y.o.   MRN: [id number]  HPI   Pain Inventory Average Pain {NUMBERS; 0-10:5044} Pain Right Now {NUMBERS; 0-10:5044} My pain is {PAIN DESCRIPTION:21022940}  In the last 24 hours, has pain interfered with the following? General activity {NUMBERS; 0-10:5044} Relation with others {NUMBERS; 0-10:5044} Enjoyment of life {NUMBERS; 0-10:5044} What TIME of day is your pain at its worst? {time of day:24191} Sleep (in general) {BHH GOOD/FAIR/POOR:22877}  Pain is worse with: {ACTIVITIES:21022942} Pain improves with: {PAIN IMPROVES TPUY:78977056} Relief from Meds: {NUMBERS; 0-10:5044}  Family History  Problem Relation Age of Onset   Lung cancer Mother    Arthritis Mother        Rheumatoid   Bursitis Mother    Asthma Mother    Heart attack Father 74       Died of MI   Obesity Father    Fibromyalgia Sister    Asthma Child    Social History   Socioeconomic History   Marital status: Divorced    Spouse name: Not on file   Number of children: 1   Years of education: Not on file   Highest education level: Not on file  Occupational History    Employer: [employer]  Tobacco Use   Smoking status: Every Day    Current packs/day: 1.00    Average packs/day: 1 pack/day for 45.6 years (45.6 ttl pk-yrs)    Types: Cigarettes    Start date: 1979-08-02   Smokeless tobacco: Never   Tobacco comments:    She is smoking 1-1.5 ppd 09/27/23  Vaping Use   Vaping status: Never Used  Substance and Sexual Activity   Alcohol use: No   Drug use: No   Sexual activity: Not on file  Other Topics Concern   Not on file  Social History Narrative   She lives by herself. Pt has 1 biological daughter and 1 adopted daughter. Biological son passed away 02-Aug-2023   Social Drivers of Health   Financial Resource Strain: Not on file  Food Insecurity: No Food Insecurity (10/02/2023)   Hunger Vital Sign    Worried About Running Out of Food  in the Last Year: Never true    Ran Out of Food in the Last Year: Never true  Transportation Needs: No Transportation Needs (10/02/2023)   PRAPARE - Administrator, Civil Service (Medical): No    Lack of Transportation (Non-Medical): No  Physical Activity: Not on file  Stress: Not on file  Social Connections: Unknown (05/16/2022)   Received from [facility]   Social Network    Social Network: Not on file   Past Surgical History:  Procedure Laterality Date   CHOLECYSTECTOMY     DIRECT LARYNGOSCOPY N/A 07/11/2018   Procedure: DIRECT LARYNGOSCOPY WITH BIOPSY;  Surgeon: Ethyl Lonni BRAVO, MD;  Location: [facility];  Service: ENT;  Laterality: N/A;   HIATAL HERNIA REPAIR     IR KYPHO LUMBAR INC FX REDUCE BONE BX UNI/BIL CANNULATION INC/IMAGING  06/23/2024   IR RADIOLOGIST EVAL & MGMT  06/19/2024   TOTAL HIP ARTHROPLASTY Right 10/02/2023   Procedure: RIGHT TOTAL HIP ARTHROPLASTY ANTERIOR APPROACH;  Surgeon: Barbarann Oneil BROCKS, MD;  Location: [facility];  Service: Orthopedics;  Laterality: Right;  NEEDS RNFA   TUBAL LIGATION  11/26/1981   Past Surgical History:  Procedure Laterality Date   CHOLECYSTECTOMY     DIRECT LARYNGOSCOPY N/A 07/11/2018   Procedure:  DIRECT LARYNGOSCOPY WITH BIOPSY;  Surgeon: Ethyl Lonni BRAVO, MD;  Location: [facility];  Service: ENT;  Laterality: N/A;   HIATAL HERNIA REPAIR     IR KYPHO LUMBAR INC FX REDUCE BONE BX UNI/BIL CANNULATION INC/IMAGING  06/23/2024   IR RADIOLOGIST EVAL & MGMT  06/19/2024   TOTAL HIP ARTHROPLASTY Right 10/02/2023   Procedure: RIGHT TOTAL HIP ARTHROPLASTY ANTERIOR APPROACH;  Surgeon: Barbarann Oneil BROCKS, MD;  Location: [facility];  Service: Orthopedics;  Laterality: Right;  NEEDS RNFA   TUBAL LIGATION  11/26/1981   Past Medical History:  Diagnosis Date   Anxiety 11/11/2011   Aortic atherosclerosis (HCC)    Arthritis    Asthma    Bursitis    right shoulder   Chronic back pain    Chronic neck pain     Chronic shoulder pain    COPD (chronic obstructive pulmonary disease) (HCC)    continues to smoke 1ppd   DDD (degenerative disc disease)    Depression    Fibromyalgia    GERD (gastroesophageal reflux disease)    Heart murmur    as a child per pt   Herpes    History of hiatal hernia    repaired   History of laryngeal cancer    History of radiation therapy 08/12/18- 10/03/18   Larynx, prescribed dose of 63 Gy in 28 fractions. She chose to stop treatments after 27 fractions.    Hypothyroidism    IBS (irritable bowel syndrome)    Lumbar radiculopathy    Seasonal allergies    Smoker    Thyroid  nodule    multinodular thyroid  goiter   There were no vitals taken for this visit.  Opioid Risk Score:   Fall Risk Score:  `1  Depression screen [facility] 2/9     02/03/2024    1:07 PM 02/03/2024    1:06 PM 09/09/2023    2:41 PM 12/12/2022   11:24 AM 01/19/2022    3:12 PM 09/01/2021    1:26 PM  Depression screen PHQ 2/9  Decreased Interest 1 0 1 0 1 3  Down, Depressed, Hopeless 1 0 1 0 0 2  PHQ - 2 Score 2 0 2 0 1 5  Altered sleeping      1  Tired, decreased energy      2  Change in appetite      0  Feeling bad or failure about yourself       0  Trouble concentrating      0  Moving slowly or fidgety/restless      3  Suicidal thoughts      0  PHQ-9 Score      11  Difficult doing work/chores      Extremely dIfficult    Review of Systems     Objective:   Physical Exam        Assessment & Plan:

## 2024-07-07 ENCOUNTER — Encounter: Admitting: Registered Nurse

## 2024-07-07 ENCOUNTER — Telehealth: Payer: Self-pay | Admitting: Physical Medicine and Rehabilitation

## 2024-07-07 MED ORDER — HYDROCODONE-ACETAMINOPHEN 10-325 MG PO TABS: 1.0000 | ORAL_TABLET | ORAL | 0 refills | Status: DC | PRN

## 2024-07-07 NOTE — Telephone Encounter (Signed)
 Pt reports pain 10/10- all the times has gone to ED 3x due to pain. Will sit and tremble due to pain.   Needs friend to come help her go to toilet- cannot even enjoy her dogs I think the plan to have the surgery is appropriate- the surgeon is Dr Alm Molt.   We went over the plan is to reduce pain somewhat and improve her function- I explained I don't think she will come off pain meds. But surgery could reduce amount of meds required.  Will increase Norco to 10/325 mg 5 pills/day.

## 2024-07-07 NOTE — Telephone Encounter (Signed)
 Patient called and cancelled her Appointment today 8.12 at 3pm ( Phone Visit). Pt states she does not want to see Fidela and would rather schedule a phone visit and talk to you instead she wanted me to pass the message that she is getting a second surgery after being seen today at [facility]. Patient expresses she is in a lot of pain and would like to discuss going up on her dosage

## 2024-07-08 ENCOUNTER — Telehealth: Payer: Self-pay

## 2024-07-08 NOTE — Telephone Encounter (Signed)
 Pt called to schedule an appointment to have someone look at her right hip which Dr. Barbarann did a THA in 11/24. She is getting ready to have back surgery and wanted to have someone look at her hip as she has been having pain for a few months with catching before her back surgery. I have her scheduled for 07/09/24 at 3:00 pm with Dr. Burnetta.

## 2024-07-09 ENCOUNTER — Ambulatory Visit: Admitting: Sports Medicine

## 2024-07-10 ENCOUNTER — Other Ambulatory Visit: Payer: Self-pay | Admitting: Neurological Surgery

## 2024-07-13 ENCOUNTER — Telehealth: Payer: Self-pay

## 2024-07-13 NOTE — Telephone Encounter (Signed)
 Patient had an appointment with Dr. Burnetta last week and was a no show. She has a right hip replacement done by Dr. Barbarann back in 09/2023. She is having spinal stenosis surgery on 07/22/24 and was advised by the neurosurgeon to have her right hip rechecked by a orthopedist before that surgery. States that it pops and wants to be sure everything is good before she has surgery. She is asking if she can be seen anytime in the next couple of days.  Please advise

## 2024-07-13 NOTE — Telephone Encounter (Signed)
 Called and scheduled patient for this Friday with Dr.Xu.

## 2024-07-17 ENCOUNTER — Ambulatory Visit: Admitting: Orthopaedic Surgery

## 2024-07-17 NOTE — Progress Notes (Signed)
 Surgical Instructions   Your procedure is scheduled on July 22, 2024. Report to [facility]trance A at 12:05 P.M., then check in with the Admitting office. Any questions or running late day of surgery: call [phone]  Questions prior to your surgery date: call [phone], Monday-Friday, 8am-4pm. If you experience any cold or flu symptoms such as cough, fever, chills, shortness of breath, etc. between now and your scheduled surgery, please notify us  at the above number.     Remember:  Do not eat after midnight the night before your surgery  You may drink clear liquids until 11:05 the morning of your surgery.   Clear liquids allowed are: Water, Non-Citrus Juices (without pulp), Carbonated Beverages, Clear Tea (no milk, honey, etc.), Black Coffee Only (NO MILK, CREAM OR POWDERED CREAMER of any kind), and Gatorade.    Take these medicines the morning of surgery with A SIP OF WATER  fluticasone  (FLONASE )  levothyroxine  (SYNTHROID )  rosuvastatin  (CRESTOR )  sertraline  (ZOLOFT )  TRELEGY ELLIPTA   May take these medicines IF NEEDED: albuterol  (ACCUNEB ) nebulizer solution  albuterol  (PROVENTIL  HFA;VENTOLIN  HFA)  inhaler  HYDROcodone -acetaminophen  (NORCO)  LORazepam  (ATIVAN )  methocarbamol  (ROBAXIN )    One week prior to surgery, STOP taking any Aspirin  (unless otherwise instructed by your surgeon) Aleve , Naproxen , Ibuprofen , Motrin , Advil , Goody's, BC's, all herbal medications, fish oil, and non-prescription vitamins.                     Do NOT Smoke (Tobacco/Vaping) for 24 hours prior to your procedure.  If you use a CPAP at night, you may bring your mask/headgear for your overnight stay.   You will be asked to remove any contacts, glasses, piercing's, hearing aid's, dentures/partials prior to surgery. Please bring cases for these items if needed.    Patients discharged the day of surgery will not be allowed to drive home, and someone needs to stay with them for 24  hours.  SURGICAL WAITING ROOM VISITATION Patients may have no more than 2 support people in the waiting area - these visitors may rotate.   Pre-op nurse will coordinate an appropriate time for 1 ADULT support person, who may not rotate, to accompany patient in pre-op.  Children under the age of 47 must have an adult with them who is not the patient and must remain in the main waiting area with an adult.  If the patient needs to stay at the hospital during part of their recovery, the visitor guidelines for inpatient rooms apply.  Please refer to the [facility] website for the visitor guidelines for any additional information.   If you received a COVID test during your pre-op visit  it is requested that you wear a mask when out in public, stay away from anyone that may not be feeling well and notify your surgeon if you develop symptoms. If you have been in contact with anyone that has tested positive in the last 10 days please notify you surgeon.      Pre-operative 5 CHG Bathing Instructions   You can play a key role in reducing the risk of infection after surgery. Your skin needs to be as free of germs as possible. You can reduce the number of germs on your skin by washing with CHG (chlorhexidine  gluconate) soap before surgery. CHG is an antiseptic soap that kills germs and continues to kill germs even after washing.   DO NOT use if you have an allergy to chlorhexidine /CHG or antibacterial soaps. If your  skin becomes reddened or irritated, stop using the CHG and notify one of our RNs at [phone].   Please shower with the CHG soap starting 4 days before surgery using the following schedule:     Please keep in mind the following:  DO NOT shave, including legs and underarms, starting the day of your first shower.   You may shave your face at any point before/day of surgery.  Place clean sheets on your bed the day you start using CHG soap. Use a clean washcloth (not used since being  washed) for each shower. DO NOT sleep with pets once you start using the CHG.   CHG Shower Instructions:  Wash your face and private area with normal soap. If you choose to wash your hair, wash first with your normal shampoo.  After you use shampoo/soap, rinse your hair and body thoroughly to remove shampoo/soap residue.  Turn the water OFF and apply about 3 tablespoons (45 ml) of CHG soap to a CLEAN washcloth.  Apply CHG soap ONLY FROM YOUR NECK DOWN TO YOUR TOES (washing for 3-5 minutes)  DO NOT use CHG soap on face, private areas, open wounds, or sores.  Pay special attention to the area where your surgery is being performed.  If you are having back surgery, having someone wash your back for you may be helpful. Wait 2 minutes after CHG soap is applied, then you may rinse off the CHG soap.  Pat dry with a clean towel  Put on clean clothes/pajamas   If you choose to wear lotion, please use ONLY the CHG-compatible lotions that are listed below.  Additional instructions for the day of surgery: DO NOT APPLY any lotions, deodorants, cologne, or perfumes.   Do not bring valuables to the hospital. [facility] is not responsible for any belongings/valuables. Do not wear nail polish, gel polish, artificial nails, or any other type of covering on natural nails (fingers and toes) Do not wear jewelry or makeup Put on clean/comfortable clothes.  Please brush your teeth.  Ask your nurse before applying any prescription medications to the skin.     CHG Compatible Lotions   Aveeno Moisturizing lotion  Cetaphil Moisturizing Cream  Cetaphil Moisturizing Lotion  Clairol Herbal Essence Moisturizing Lotion, Dry Skin  Clairol Herbal Essence Moisturizing Lotion, Extra Dry Skin  Clairol Herbal Essence Moisturizing Lotion, Normal Skin  Curel Age Defying Therapeutic Moisturizing Lotion with Alpha Hydroxy  Curel Extreme Care Body Lotion  Curel Soothing Hands Moisturizing Hand Lotion  Curel Therapeutic  Moisturizing Cream, Fragrance-Free  Curel Therapeutic Moisturizing Lotion, Fragrance-Free  Curel Therapeutic Moisturizing Lotion, Original Formula  Eucerin Daily Replenishing Lotion  Eucerin Dry Skin Therapy Plus Alpha Hydroxy Crme  Eucerin Dry Skin Therapy Plus Alpha Hydroxy Lotion  Eucerin Original Crme  Eucerin Original Lotion  Eucerin Plus Crme Eucerin Plus Lotion  Eucerin TriLipid Replenishing Lotion  Keri Anti-Bacterial Hand Lotion  Keri Deep Conditioning Original Lotion Dry Skin Formula Softly Scented  Keri Deep Conditioning Original Lotion, Fragrance Free Sensitive Skin Formula  Keri Lotion Fast Absorbing Fragrance Free Sensitive Skin Formula  Keri Lotion Fast Absorbing Softly Scented Dry Skin Formula  Keri Original Lotion  Keri Skin Renewal Lotion Keri Silky Smooth Lotion  Keri Silky Smooth Sensitive Skin Lotion  Nivea Body Creamy Conditioning Oil  Nivea Body Extra Enriched Teacher, adult education Moisturizing Lotion Nivea Crme  Nivea Skin Firming Lotion  NutraDerm 30 Skin Lotion  NutraDerm Skin Lotion  NutraDerm Therapeutic  Skin Cream  NutraDerm Therapeutic Skin Lotion  ProShield Protective Hand Cream  Provon moisturizing lotion  Please read over the following fact sheets that you were given.

## 2024-07-20 ENCOUNTER — Inpatient Hospital Stay (HOSPITAL_COMMUNITY)
Admission: RE | Admit: 2024-07-20 | Discharge: 2024-07-20 | Disposition: A | Discharge status: Home / Self Care | Source: Ambulatory Visit

## 2024-07-20 NOTE — Progress Notes (Signed)
 Patient called to cancel PAT appointment due to being unable to leave her house due to pain.

## 2024-07-21 ENCOUNTER — Encounter (HOSPITAL_COMMUNITY): Payer: Self-pay | Admitting: Neurological Surgery

## 2024-07-21 ENCOUNTER — Other Ambulatory Visit: Payer: Self-pay

## 2024-07-21 NOTE — Progress Notes (Signed)
 Notified Shanda in Dr. Tamala office that pt was treated for urinary tract infection 07/02/24 and treated with Keflex . Shanda will notify Dr. Joshua. I spoke with patient and she stated she completed the entire course of Keflex  she was prescribed. She denies having any urinary tract infection symptoms since taking the keflex .

## 2024-07-21 NOTE — Progress Notes (Signed)
 SDW CALL  Patient was given pre-op instructions over the phone. The opportunity was given for the patient to ask questions. No further questions asked. Patient verbalized understanding of instructions given.   PCP - Darice Richter,MD Cardiologist - denies  PPM/ICD - denies Device Orders -  Rep Notified -   Chest x-ray - 04/28/24 EKG - 04/28/24 Stress Test - denies ECHO - denies Cardiac Cath - 08/05/01  Sleep Study - denies CPAP -   Fasting Blood Sugar - na Checks Blood Sugar _____ times a day  Blood Thinner Instructions:na Aspirin  Instructions:na  ERAS Protcol -clears until 1050 PRE-SURGERY Ensure or G2- no  COVID TEST- na   Anesthesia review: no  Patient denies shortness of breath, fever, cough and chest pain over the phone call   Special instructions:    Oral Hygiene is also important to reduce your risk of infection.  Remember - BRUSH YOUR TEETH THE MORNING OF SURGERY WITH YOUR REGULAR TOOTHPASTE

## 2024-07-21 NOTE — Progress Notes (Signed)
 Called and left a voicemail message with patient to arrive at 1310 for a 1510 procedure start time.

## 2024-07-22 ENCOUNTER — Ambulatory Visit (HOSPITAL_COMMUNITY): Admitting: Anesthesiology

## 2024-07-22 ENCOUNTER — Ambulatory Visit (HOSPITAL_COMMUNITY)

## 2024-07-22 ENCOUNTER — Encounter (HOSPITAL_COMMUNITY): Payer: Self-pay | Admitting: Neurological Surgery

## 2024-07-22 ENCOUNTER — Other Ambulatory Visit: Payer: Self-pay

## 2024-07-22 ENCOUNTER — Ambulatory Visit (HOSPITAL_COMMUNITY)
Admission: RE | Disposition: A | Discharge status: Home / Self Care | Payer: Self-pay | Source: Home / Self Care | Attending: Neurological Surgery

## 2024-07-22 ENCOUNTER — Observation Stay (HOSPITAL_COMMUNITY)
Admission: RE | Admit: 2024-07-22 | Discharge: 2024-07-23 | Disposition: A | Discharge status: Home / Self Care | Attending: Neurological Surgery | Admitting: Neurological Surgery

## 2024-07-22 DIAGNOSIS — E039 Hypothyroidism, unspecified: Secondary | ICD-10-CM | POA: Insufficient documentation

## 2024-07-22 DIAGNOSIS — F17210 Nicotine dependence, cigarettes, uncomplicated: Secondary | ICD-10-CM

## 2024-07-22 DIAGNOSIS — M5416 Radiculopathy, lumbar region: Secondary | ICD-10-CM | POA: Insufficient documentation

## 2024-07-22 DIAGNOSIS — J449 Chronic obstructive pulmonary disease, unspecified: Secondary | ICD-10-CM | POA: Insufficient documentation

## 2024-07-22 DIAGNOSIS — M48061 Spinal stenosis, lumbar region without neurogenic claudication: Principal | ICD-10-CM | POA: Insufficient documentation

## 2024-07-22 DIAGNOSIS — J441 Chronic obstructive pulmonary disease with (acute) exacerbation: Secondary | ICD-10-CM

## 2024-07-22 DIAGNOSIS — I2510 Atherosclerotic heart disease of native coronary artery without angina pectoris: Secondary | ICD-10-CM

## 2024-07-22 DIAGNOSIS — Z9889 Other specified postprocedural states: Principal | ICD-10-CM

## 2024-07-22 HISTORY — PX: LUMBAR LAMINECTOMY/DECOMPRESSION MICRODISCECTOMY: SHX5026

## 2024-07-22 LAB — SURGICAL PCR SCREEN
MRSA, PCR: NEGATIVE
Staphylococcus aureus: NEGATIVE

## 2024-07-22 SURGERY — LUMBAR LAMINECTOMY/DECOMPRESSION MICRODISCECTOMY 1 LEVEL
Anesthesia: General | Site: Back | Laterality: Left

## 2024-07-22 MED ORDER — CHLORHEXIDINE GLUCONATE 0.12 % MT SOLN: 15.0000 mL | Freq: Once | OROMUCOSAL | Status: AC

## 2024-07-22 MED ORDER — PHENYLEPHRINE 80 MCG/ML (10ML) SYRINGE FOR IV PUSH (FOR BLOOD PRESSURE SUPPORT)
PREFILLED_SYRINGE | INTRAVENOUS | Status: DC | PRN
  Administered 2024-07-22: 80 ug via INTRAVENOUS

## 2024-07-22 MED ORDER — IPRATROPIUM-ALBUTEROL 0.5-2.5 (3) MG/3ML IN SOLN
RESPIRATORY_TRACT | Status: AC
  Administered 2024-07-22: 3 mL via RESPIRATORY_TRACT
  Filled 2024-07-22: qty 3

## 2024-07-22 MED ORDER — ONDANSETRON HCL 4 MG/2ML IJ SOLN
INTRAMUSCULAR | Status: AC
  Filled 2024-07-22: qty 2

## 2024-07-22 MED ORDER — LEVOTHYROXINE SODIUM 25 MCG PO TABS
25.0000 ug | ORAL_TABLET | Freq: Every day | ORAL | Status: DC
  Administered 2024-07-23: 25 ug via ORAL
  Filled 2024-07-22: qty 1

## 2024-07-22 MED ORDER — DEXAMETHASONE SODIUM PHOSPHATE 4 MG/ML IJ SOLN: 4.0000 mg | Freq: Four times a day (QID) | INTRAMUSCULAR | Status: DC

## 2024-07-22 MED ORDER — IPRATROPIUM-ALBUTEROL 0.5-2.5 (3) MG/3ML IN SOLN: 3.0000 mL | Freq: Once | RESPIRATORY_TRACT | Status: AC

## 2024-07-22 MED ORDER — ORAL CARE MOUTH RINSE: 15.0000 mL | Freq: Once | OROMUCOSAL | Status: AC

## 2024-07-22 MED ORDER — ACETAMINOPHEN 500 MG PO TABS
ORAL_TABLET | ORAL | Status: AC
Start: 2024-07-22 — End: 2024-07-23
  Filled 2024-07-22: qty 2

## 2024-07-22 MED ORDER — SODIUM CHLORIDE 0.9% FLUSH: 3.0000 mL | INTRAVENOUS | Status: DC | PRN

## 2024-07-22 MED ORDER — DIPHENHYDRAMINE HCL 50 MG/ML IJ SOLN
INTRAMUSCULAR | Status: DC | PRN
  Administered 2024-07-22: 12.5 mg via INTRAVENOUS

## 2024-07-22 MED ORDER — ONDANSETRON HCL 4 MG/2ML IJ SOLN: 4.0000 mg | Freq: Four times a day (QID) | INTRAMUSCULAR | Status: DC | PRN

## 2024-07-22 MED ORDER — CHLORHEXIDINE GLUCONATE CLOTH 2 % EX PADS: 6.0000 | MEDICATED_PAD | Freq: Once | CUTANEOUS | Status: DC

## 2024-07-22 MED ORDER — MEPERIDINE HCL 25 MG/ML IJ SOLN: 6.2500 mg | INTRAMUSCULAR | Status: DC | PRN

## 2024-07-22 MED ORDER — FENTANYL CITRATE (PF) 250 MCG/5ML IJ SOLN
INTRAMUSCULAR | Status: DC | PRN
  Administered 2024-07-22: 50 ug via INTRAVENOUS
  Administered 2024-07-22: 100 ug via INTRAVENOUS

## 2024-07-22 MED ORDER — ACETAMINOPHEN 500 MG PO TABS: 1000.0000 mg | ORAL_TABLET | ORAL | Status: DC

## 2024-07-22 MED ORDER — ACETAMINOPHEN 650 MG RE SUPP: 650.0000 mg | RECTAL | Status: DC | PRN

## 2024-07-22 MED ORDER — THROMBIN 5000 UNITS EX KIT
PACK | CUTANEOUS | Status: AC
  Filled 2024-07-22: qty 2

## 2024-07-22 MED ORDER — SUGAMMADEX SODIUM 200 MG/2ML IV SOLN
INTRAVENOUS | Status: DC | PRN
  Administered 2024-07-22: 200 mg via INTRAVENOUS

## 2024-07-22 MED ORDER — HYDROMORPHONE HCL 1 MG/ML IJ SOLN
INTRAMUSCULAR | Status: AC
  Filled 2024-07-22: qty 1

## 2024-07-22 MED ORDER — DEXAMETHASONE SODIUM PHOSPHATE 10 MG/ML IJ SOLN
INTRAMUSCULAR | Status: DC | PRN
  Administered 2024-07-22: 10 mg via INTRAVENOUS

## 2024-07-22 MED ORDER — ONDANSETRON HCL 4 MG PO TABS: 4.0000 mg | ORAL_TABLET | Freq: Four times a day (QID) | ORAL | Status: DC | PRN

## 2024-07-22 MED ORDER — MIDAZOLAM HCL 2 MG/2ML IJ SOLN
INTRAMUSCULAR | Status: AC
  Filled 2024-07-22: qty 2

## 2024-07-22 MED ORDER — DEXAMETHASONE SODIUM PHOSPHATE 10 MG/ML IJ SOLN
INTRAMUSCULAR | Status: AC
  Filled 2024-07-22: qty 1

## 2024-07-22 MED ORDER — ACETAMINOPHEN 325 MG PO TABS: 650.0000 mg | ORAL_TABLET | ORAL | Status: DC | PRN

## 2024-07-22 MED ORDER — DEXAMETHASONE 4 MG PO TABS
4.0000 mg | ORAL_TABLET | Freq: Four times a day (QID) | ORAL | Status: DC
Start: 2024-07-22 — End: 2024-07-23
  Administered 2024-07-22 – 2024-07-23 (×3): 4 mg via ORAL
  Filled 2024-07-22 (×3): qty 1

## 2024-07-22 MED ORDER — HYDROCODONE-ACETAMINOPHEN 10-325 MG PO TABS
1.0000 | ORAL_TABLET | ORAL | Status: DC | PRN
  Administered 2024-07-22 – 2024-07-23 (×3): 1 via ORAL
  Filled 2024-07-22 (×3): qty 1

## 2024-07-22 MED ORDER — GABAPENTIN 300 MG PO CAPS
ORAL_CAPSULE | ORAL | Status: AC
  Filled 2024-07-22: qty 1

## 2024-07-22 MED ORDER — CEFAZOLIN SODIUM-DEXTROSE 2-4 GM/100ML-% IV SOLN
INTRAVENOUS | Status: AC
  Filled 2024-07-22: qty 100

## 2024-07-22 MED ORDER — GABAPENTIN 300 MG PO CAPS: 300.0000 mg | ORAL_CAPSULE | ORAL | Status: DC

## 2024-07-22 MED ORDER — METHOCARBAMOL 1000 MG/10ML IJ SOLN: 500.0000 mg | Freq: Four times a day (QID) | INTRAMUSCULAR | Status: DC | PRN

## 2024-07-22 MED ORDER — ACETAMINOPHEN 10 MG/ML IV SOLN
INTRAVENOUS | Status: AC
  Filled 2024-07-22: qty 100

## 2024-07-22 MED ORDER — 0.9 % SODIUM CHLORIDE (POUR BTL) OPTIME
TOPICAL | Status: DC | PRN
Start: 2024-07-22 — End: 2024-07-22
  Administered 2024-07-22: 1000 mL

## 2024-07-22 MED ORDER — POTASSIUM CHLORIDE IN NACL 20-0.9 MEQ/L-% IV SOLN
INTRAVENOUS | Status: DC
  Filled 2024-07-22: qty 1000

## 2024-07-22 MED ORDER — THROMBIN (RECOMBINANT) 5000 UNITS EX SOLR: CUTANEOUS | Status: DC | PRN

## 2024-07-22 MED ORDER — HYDROCODONE-ACETAMINOPHEN 7.5-325 MG PO TABS: 1.0000 | ORAL_TABLET | Freq: Four times a day (QID) | ORAL | Status: DC

## 2024-07-22 MED ORDER — CEFAZOLIN SODIUM-DEXTROSE 2-4 GM/100ML-% IV SOLN
2.0000 g | INTRAVENOUS | Status: AC
  Administered 2024-07-22: 2 g via INTRAVENOUS

## 2024-07-22 MED ORDER — MIDAZOLAM HCL 2 MG/2ML IJ SOLN: 0.5000 mg | Freq: Once | INTRAMUSCULAR | Status: DC | PRN

## 2024-07-22 MED ORDER — BUPIVACAINE HCL (PF) 0.25 % IJ SOLN
INTRAMUSCULAR | Status: AC
  Filled 2024-07-22: qty 30

## 2024-07-22 MED ORDER — LIDOCAINE 2% (20 MG/ML) 5 ML SYRINGE
INTRAMUSCULAR | Status: AC
  Filled 2024-07-22: qty 5

## 2024-07-22 MED ORDER — LACTATED RINGERS IV SOLN: INTRAVENOUS | Status: DC

## 2024-07-22 MED ORDER — ROCURONIUM BROMIDE 10 MG/ML (PF) SYRINGE
PREFILLED_SYRINGE | INTRAVENOUS | Status: DC | PRN
  Administered 2024-07-22: 60 mg via INTRAVENOUS

## 2024-07-22 MED ORDER — CELECOXIB 200 MG PO CAPS
200.0000 mg | ORAL_CAPSULE | Freq: Two times a day (BID) | ORAL | Status: DC
  Administered 2024-07-22: 200 mg via ORAL
  Filled 2024-07-22: qty 1

## 2024-07-22 MED ORDER — PROPOFOL 10 MG/ML IV BOLUS
INTRAVENOUS | Status: AC
  Filled 2024-07-22: qty 20

## 2024-07-22 MED ORDER — CEFAZOLIN SODIUM-DEXTROSE 2-4 GM/100ML-% IV SOLN
2.0000 g | Freq: Three times a day (TID) | INTRAVENOUS | Status: AC
  Administered 2024-07-22 – 2024-07-23 (×2): 2 g via INTRAVENOUS
  Filled 2024-07-22 (×2): qty 100

## 2024-07-22 MED ORDER — THROMBIN 5000 UNITS EX SOLR: CUTANEOUS | Status: DC | PRN

## 2024-07-22 MED ORDER — PHENOL 1.4 % MT LIQD: 1.0000 | OROMUCOSAL | Status: DC | PRN

## 2024-07-22 MED ORDER — THROMBIN 5000 UNITS EX KIT
PACK | CUTANEOUS | Status: AC
  Filled 2024-07-22: qty 1

## 2024-07-22 MED ORDER — SENNA 8.6 MG PO TABS
1.0000 | ORAL_TABLET | Freq: Two times a day (BID) | ORAL | Status: DC
  Administered 2024-07-22: 8.6 mg via ORAL
  Filled 2024-07-22: qty 1

## 2024-07-22 MED ORDER — SODIUM CHLORIDE 0.9 % IV SOLN
250.0000 mL | INTRAVENOUS | Status: DC
  Administered 2024-07-22: 250 mL via INTRAVENOUS

## 2024-07-22 MED ORDER — MIDAZOLAM HCL 2 MG/2ML IJ SOLN
INTRAMUSCULAR | Status: DC | PRN
  Administered 2024-07-22: 2 mg via INTRAVENOUS

## 2024-07-22 MED ORDER — FLUTICASONE PROPIONATE 50 MCG/ACT NA SUSP
1.0000 | Freq: Every day | NASAL | Status: DC
  Filled 2024-07-22: qty 16

## 2024-07-22 MED ORDER — METHOCARBAMOL 500 MG PO TABS
500.0000 mg | ORAL_TABLET | Freq: Four times a day (QID) | ORAL | Status: DC | PRN
  Administered 2024-07-22 – 2024-07-23 (×2): 500 mg via ORAL
  Filled 2024-07-22 (×2): qty 1

## 2024-07-22 MED ORDER — ONDANSETRON HCL 4 MG/2ML IJ SOLN
INTRAMUSCULAR | Status: DC | PRN
  Administered 2024-07-22: 4 mg via INTRAVENOUS

## 2024-07-22 MED ORDER — BUPIVACAINE HCL (PF) 0.25 % IJ SOLN
INTRAMUSCULAR | Status: DC | PRN
Start: 2024-07-22 — End: 2024-07-22
  Administered 2024-07-22: 8 mL
  Administered 2024-07-22: 10 mL

## 2024-07-22 MED ORDER — LIDOCAINE 2% (20 MG/ML) 5 ML SYRINGE
INTRAMUSCULAR | Status: DC | PRN
  Administered 2024-07-22: 100 mg via INTRAVENOUS

## 2024-07-22 MED ORDER — FENTANYL CITRATE (PF) 250 MCG/5ML IJ SOLN
INTRAMUSCULAR | Status: AC
Start: 2024-07-22 — End: 2024-07-22
  Filled 2024-07-22: qty 5

## 2024-07-22 MED ORDER — MENTHOL 3 MG MT LOZG: 1.0000 | LOZENGE | OROMUCOSAL | Status: DC | PRN

## 2024-07-22 MED ORDER — SODIUM CHLORIDE 0.9% FLUSH
3.0000 mL | Freq: Two times a day (BID) | INTRAVENOUS | Status: DC
  Administered 2024-07-22: 3 mL via INTRAVENOUS

## 2024-07-22 MED ORDER — ALBUTEROL SULFATE HFA 108 (90 BASE) MCG/ACT IN AERS: 2.0000 | INHALATION_SPRAY | Freq: Four times a day (QID) | RESPIRATORY_TRACT | Status: DC | PRN

## 2024-07-22 MED ORDER — ACETAMINOPHEN 10 MG/ML IV SOLN
INTRAVENOUS | Status: DC | PRN
Start: 2024-07-22 — End: 2024-07-22
  Administered 2024-07-22: 1000 mg via INTRAVENOUS

## 2024-07-22 MED ORDER — ROCURONIUM BROMIDE 10 MG/ML (PF) SYRINGE
PREFILLED_SYRINGE | INTRAVENOUS | Status: AC
  Filled 2024-07-22: qty 10

## 2024-07-22 MED ORDER — CHLORHEXIDINE GLUCONATE 0.12 % MT SOLN
OROMUCOSAL | Status: AC
  Administered 2024-07-22: 15 mL via OROMUCOSAL
  Filled 2024-07-22: qty 15

## 2024-07-22 MED ORDER — ALBUTEROL SULFATE (2.5 MG/3ML) 0.083% IN NEBU: 3.0000 mL | INHALATION_SOLUTION | Freq: Three times a day (TID) | RESPIRATORY_TRACT | Status: DC | PRN

## 2024-07-22 MED ORDER — ALBUTEROL SULFATE HFA 108 (90 BASE) MCG/ACT IN AERS
INHALATION_SPRAY | RESPIRATORY_TRACT | Status: DC | PRN
Start: 2024-07-22 — End: 2024-07-22
  Administered 2024-07-22: 4 via RESPIRATORY_TRACT
  Administered 2024-07-22: 2 via RESPIRATORY_TRACT

## 2024-07-22 MED ORDER — HYDROMORPHONE HCL 1 MG/ML IJ SOLN
0.2500 mg | INTRAMUSCULAR | Status: DC | PRN
  Administered 2024-07-22: 0.25 mg via INTRAVENOUS

## 2024-07-22 MED ORDER — PROPOFOL 10 MG/ML IV BOLUS
INTRAVENOUS | Status: DC | PRN
  Administered 2024-07-22: 50 ug/kg/min via INTRAVENOUS
  Administered 2024-07-22: 100 mg via INTRAVENOUS

## 2024-07-22 SURGICAL SUPPLY — 36 items
BAG COUNTER SPONGE SURGICOUNT (BAG) ×1 IMPLANT
BAND RUBBER #18 3X1/16 STRL (MISCELLANEOUS) ×2 IMPLANT
BENZOIN TINCTURE PRP APPL 2/3 (GAUZE/BANDAGES/DRESSINGS) ×1 IMPLANT
BUR CARBIDE MATCH 3.0 (BURR) ×1 IMPLANT
CANISTER SUCTION 3000ML PPV (SUCTIONS) ×1 IMPLANT
DRAPE LAPAROTOMY 100X72X124 (DRAPES) ×1 IMPLANT
DRAPE MICROSCOPE SLANT 54X150 (MISCELLANEOUS) ×1 IMPLANT
DRAPE SURG 17X23 STRL (DRAPES) ×1 IMPLANT
DRSG OPSITE POSTOP 4X6 (GAUZE/BANDAGES/DRESSINGS) IMPLANT
DURAPREP 26ML APPLICATOR (WOUND CARE) ×1 IMPLANT
ELECTRODE REM PT RTRN 9FT ADLT (ELECTROSURGICAL) ×1 IMPLANT
GAUZE 4X4 16PLY ~~LOC~~+RFID DBL (SPONGE) IMPLANT
GLOVE BIO SURGEON STRL SZ7 (GLOVE) IMPLANT
GLOVE BIO SURGEON STRL SZ8 (GLOVE) ×1 IMPLANT
GLOVE BIOGEL PI IND STRL 7.0 (GLOVE) IMPLANT
GOWN STRL REUS W/ TWL LRG LVL3 (GOWN DISPOSABLE) IMPLANT
GOWN STRL REUS W/ TWL XL LVL3 (GOWN DISPOSABLE) ×1 IMPLANT
GOWN STRL REUS W/TWL 2XL LVL3 (GOWN DISPOSABLE) IMPLANT
HEMOSTAT POWDER KIT SURGIFOAM (HEMOSTASIS) ×1 IMPLANT
KIT BASIN OR (CUSTOM PROCEDURE TRAY) ×1 IMPLANT
KIT TURNOVER KIT B (KITS) ×1 IMPLANT
NDL HYPO 25X1 1.5 SAFETY (NEEDLE) ×1 IMPLANT
NDL SPNL 20GX3.5 QUINCKE YW (NEEDLE) IMPLANT
NEEDLE HYPO 25X1 1.5 SAFETY (NEEDLE) ×1 IMPLANT
NEEDLE SPNL 20GX3.5 QUINCKE YW (NEEDLE) IMPLANT
NS IRRIG 1000ML POUR BTL (IV SOLUTION) ×1 IMPLANT
PACK LAMINECTOMY NEURO (CUSTOM PROCEDURE TRAY) ×1 IMPLANT
PAD ARMBOARD POSITIONER FOAM (MISCELLANEOUS) ×3 IMPLANT
SPONGE SURGIFOAM ABS GEL SZ50 (HEMOSTASIS) IMPLANT
STRIP CLOSURE SKIN 1/2X4 (GAUZE/BANDAGES/DRESSINGS) ×1 IMPLANT
SUT VIC AB 0 CT1 18XCR BRD8 (SUTURE) ×1 IMPLANT
SUT VIC AB 2-0 CP2 18 (SUTURE) ×1 IMPLANT
SUT VIC AB 3-0 SH 8-18 (SUTURE) ×1 IMPLANT
TOWEL GREEN STERILE (TOWEL DISPOSABLE) ×1 IMPLANT
TOWEL GREEN STERILE FF (TOWEL DISPOSABLE) ×1 IMPLANT
WATER STERILE IRR 1000ML POUR (IV SOLUTION) ×1 IMPLANT

## 2024-07-22 NOTE — Progress Notes (Signed)
 Patient called to confirm arrival time for surgery today (1310). Stated she would have to reschedule if the time changed again due to transportation.

## 2024-07-22 NOTE — Anesthesia Procedure Notes (Signed)
 Procedure Name: Intubation Date/Time: 07/22/2024 4:12 PM  Performed by: Lanning Cena RAMAN, CRNAPre-anesthesia Checklist: Patient identified, Emergency Drugs available, Suction available, Patient being monitored and Timeout performed Patient Re-evaluated:Patient Re-evaluated prior to induction Oxygen Delivery Method: Circle system utilized Preoxygenation: Pre-oxygenation with 100% oxygen Induction Type: Inhalational induction Ventilation: Mask ventilation without difficulty Laryngoscope Size: Glidescope and 3 Grade View: Grade II Tube type: Oral Tube size: 7.0 mm Number of attempts: 1 Airway Equipment and Method: Stylet and Video-laryngoscopy Placement Confirmation: ETT inserted through vocal cords under direct vision, positive ETCO2, CO2 detector and breath sounds checked- equal and bilateral Secured at: 19 cm Tube secured with: Tape Dental Injury: Teeth and Oropharynx as per pre-operative assessment

## 2024-07-22 NOTE — Anesthesia Postprocedure Evaluation (Signed)
 Anesthesia Post Note  Patient: Sheri Rivera  Procedure(s) Performed: LUMBAR FOUR-FIVE, LUMBAR FIVE-SACRAL ONE LAMINECTOMY AND FORAMINOTOMY (Left: Back)     Patient location during evaluation: PACU Anesthesia Type: General Level of consciousness: awake and alert Pain control: pain improving. Vital Signs Assessment: post-procedure vital signs reviewed and stable Respiratory status: spontaneous breathing, nonlabored ventilation and respiratory function stable (coughing relieved) Cardiovascular status: blood pressure returned to baseline and stable Postop Assessment: no apparent nausea or vomiting Anesthetic complications: no   No notable events documented.  Last Vitals:  Vitals:   07/22/24 1800 07/22/24 1815  BP: 110/74 119/81  Pulse: 88 79  Resp: 18 18  Temp:  36.7 C  SpO2: 92% 93%    Last Pain:  Vitals:   07/22/24 1805  TempSrc:   PainSc: 10-Worst pain ever                 Amiri Riechers,E. Cait Locust

## 2024-07-22 NOTE — H&P (Signed)
 Subjective: Patient is a 63 y.o. female admitted for spinal stenosis. Onset of symptoms was several months ago, rapidly worsening since that time.  The pain is rated intense, and is located at the across the lower back and radiates to left lower extremity. The pain is described as aching, sharp, and stabbing and occurs all day. The symptoms have been progressive. Symptoms are exacerbated by nothing in particular. MRI or CT showed left-sided disc herniation with lateral recess stenosis and spinal stenosis at L4-5 with slight anterior listhesis  Past Medical History:  Diagnosis Date   Anxiety 11/11/2011   Aortic atherosclerosis (HCC)    Arthritis    Asthma    Bursitis    right shoulder   Chronic back pain    Chronic neck pain    Chronic shoulder pain    COPD (chronic obstructive pulmonary disease) (HCC)    continues to smoke 1ppd   DDD (degenerative disc disease)    Depression    Fibromyalgia    GERD (gastroesophageal reflux disease)    Heart murmur    as a child per pt   Herpes    History of hiatal hernia    repaired   History of laryngeal cancer    History of radiation therapy 08/12/18- 10/03/18   Larynx, prescribed dose of 63 Gy in 28 fractions. She chose to stop treatments after 27 fractions.    Hypothyroidism    IBS (irritable bowel syndrome)    Lumbar radiculopathy    Seasonal allergies    Smoker    Thyroid  nodule    multinodular thyroid  goiter    Past Surgical History:  Procedure Laterality Date   CHOLECYSTECTOMY     DIRECT LARYNGOSCOPY N/A 07/11/2018   Procedure: DIRECT LARYNGOSCOPY WITH BIOPSY;  Surgeon: Ethyl Lonni BRAVO, MD;  Location: [facility];  Service: ENT;  Laterality: N/A;   HIATAL HERNIA REPAIR     IR KYPHO LUMBAR INC FX REDUCE BONE BX UNI/BIL CANNULATION INC/IMAGING  06/23/2024   IR RADIOLOGIST EVAL & MGMT  06/19/2024   TOTAL HIP ARTHROPLASTY Right 10/02/2023   Procedure: RIGHT TOTAL HIP ARTHROPLASTY ANTERIOR APPROACH;  Surgeon: Barbarann Oneil BROCKS, MD;  Location: [facility] OR;  Service: Orthopedics;  Laterality: Right;  NEEDS RNFA   TUBAL LIGATION  11/26/1981    Prior to Admission medications   Medication Sig Start Date End Date Taking? Authorizing Provider  albuterol  (ACCUNEB ) 1.25 MG/3ML nebulizer solution Take 1 ampule by nebulization every 8 (eight) hours as needed for wheezing or shortness of breath. 09/05/23  Yes [provider]  albuterol  (PROVENTIL  HFA;VENTOLIN  HFA) 108 (90 BASE) MCG/ACT inhaler Inhale 2 puffs into the lungs every 6 (six) hours as needed for shortness of breath. Shortness of breath   Yes [provider]  cephALEXin  (KEFLEX ) 500 MG capsule Take 1 capsule (500 mg total) by mouth 2 (two) times daily. 07/02/24  Yes Diona Perkins, MD  HYDROcodone -acetaminophen  (NORCO) 10-325 MG tablet Take 1 tablet by mouth every 4 (four) hours as needed for severe pain (pain score 7-10). Max 5 pills/day- due to increased lumbar stenosis- 07/07/24  Yes Lovorn, Megan, MD  levothyroxine  (SYNTHROID ) 25 MCG tablet Take 25 mcg by mouth daily before breakfast.   Yes [provider]  LORazepam  (ATIVAN ) 0.5 MG tablet Take 1 tab PRN extreme anxiety, not to exceed once daily. Use sparingly. Future Rx only at the discretion of the primary care provider. 11/07/18  Yes Izell Domino, MD  Vitamin D , Ergocalciferol , (DRISDOL ) 1.25 MG (50000  UNIT) CAPS capsule Take 50,000 Units by mouth once a week. 07/09/23  Yes [provider]  fluticasone  (FLONASE ) 50 MCG/ACT nasal spray Place 1 spray into both nostrils daily. 10/13/21   [provider]  methocarbamol  (ROBAXIN ) 500 MG tablet Take 1 tablet (500 mg total) by mouth every 6 (six) hours as needed for muscle spasms. Patient not taking: Reported on 07/21/2024 10/03/23   Barbarann Oneil BROCKS, MD  predniSONE  (STERAPRED UNI-PAK 21 TAB) 10 MG (21) TBPK tablet Take 6 tabs on day 1, then 5 tabs on day 2, 4 tabs on day 3, 3 tab on day 4, 2 tabs on day 5, 1 tab on day 6 Patient not taking:  Reported on 07/21/2024 07/02/24   Diona Perkins, MD  rosuvastatin  (CRESTOR ) 10 MG tablet Take 10 mg by mouth daily. Patient not taking: Reported on 07/21/2024 07/02/23   [provider]  sertraline  (ZOLOFT ) 50 MG tablet Take 50 mg by mouth daily. Patient not taking: Reported on 07/21/2024 01/28/24   [provider]  DOMINIC BECK 200-62.5-25 MCG/ACT AEPB Take 1 puff by mouth daily. Patient not taking: Reported on 07/21/2024 12/17/22   [provider]   Allergies  Allergen Reactions   Clindamycin Anaphylaxis and Swelling   Cymbalta [Duloxetine Hcl] Anaphylaxis and Itching    Lips and throat swelled; stopped breathing   Gabapentin  Other (See Comments)    Made whole body numb   Levaquin  [Levofloxacin  In D5w] Anaphylaxis and Itching   Pregabalin Shortness Of Breath, Swelling and Anaphylaxis    Lips and throat swelled Lips and throat swelled   Shellfish Allergy Anaphylaxis    Has EPI-PEN Has EPI-PEN   Sulfa Antibiotics Swelling   Sulfamethoxazole Anaphylaxis   Crestor  [Rosuvastatin ] Itching   Oxycodone  Nausea And Vomiting   Codeine Anxiety    Sweating,nervous Pt states she can tolerate hydrocodone     Social History   Tobacco Use   Smoking status: Every Day    Current packs/day: 1.00    Average packs/day: 1 pack/day for 45.7 years (45.7 ttl pk-yrs)    Types: Cigarettes    Start date: 1980   Smokeless tobacco: Never   Tobacco comments:    She is smoking 1-1.5 ppd 09/27/23  Substance Use Topics   Alcohol use: No    Family History  Problem Relation Age of Onset   Lung cancer Mother    Arthritis Mother        Rheumatoid   Bursitis Mother    Asthma Mother    Heart attack Father 72       Died of MI   Obesity Father    Fibromyalgia Sister    Asthma Child      Review of Systems  Positive ROS: Negative  All other systems have been reviewed and were otherwise negative with the exception of those mentioned in the HPI and as above.  Objective: Vital  signs in last 24 hours: Temp:  [97.7 F (36.5 C)] 97.7 F (36.5 C) (08/27 1312) Pulse Rate:  [73] 73 (08/27 1312) Resp:  [18] 18 (08/27 1312) BP: (132)/(82) 132/82 (08/27 1312) SpO2:  [92 %] 92 % (08/27 1312) Weight:  [78.9 kg] 78.9 kg (08/27 1312)  General Appearance: Alert, cooperative, no distress, appears stated age Head: Normocephalic, without obvious abnormality, atraumatic Eyes: PERRL, conjunctiva/corneas clear, EOM's intact    Neck: Supple, symmetrical, trachea midline Back: Symmetric, no curvature, ROM normal, no CVA tenderness Lungs:  respirations unlabored Heart: Regular rate and rhythm Abdomen: Soft, non-tender Extremities:  Extremities normal, atraumatic, no cyanosis or edema Pulses: 2+ and symmetric all extremities Skin: Skin color, texture, turgor normal, no rashes or lesions  NEUROLOGIC:   Mental status: Alert and oriented x4,  no aphasia, good attention span, fund of knowledge, and memory Motor Exam - grossly normal Sensory Exam - grossly normal Reflexes: 1+ Coordination - grossly normal Gait - grossly normal Balance - grossly normal Cranial Nerves: I: smell Not tested  II: visual acuity  OS: nl    OD: nl  II: visual fields Full to confrontation  II: pupils Equal, round, reactive to light  III,VII: ptosis None  III,IV,VI: extraocular muscles  Full ROM  V: mastication Normal  V: facial light touch sensation  Normal  V,VII: corneal reflex  Present  VII: facial muscle function - upper  Normal  VII: facial muscle function - lower Normal  VIII: hearing Not tested  IX: soft palate elevation  Normal  IX,X: gag reflex Present  XI: trapezius strength  5/5  XI: sternocleidomastoid strength 5/5  XI: neck flexion strength  5/5  XII: tongue strength  Normal    Data Review Lab Results  Component Value Date   WBC 10.1 07/02/2024   HGB 15.0 07/02/2024   HCT 46.8 (H) 07/02/2024   MCV 97.1 07/02/2024   PLT 279 07/02/2024   Lab Results  Component Value Date    NA 137 07/02/2024   K 4.6 07/02/2024   CL 104 07/02/2024   CO2 22 07/02/2024   BUN 7 07/02/2024   CREATININE 0.46 07/02/2024   GLUCOSE 88 07/02/2024   Lab Results  Component Value Date   INR 0.9 06/15/2008    Assessment/Plan:  Estimated body mass index is 31.83 kg/m as calculated from the following:   Height as of this encounter: 5' 2 (1.575 m).   Weight as of this encounter: 78.9 kg. Patient admitted for left L4-5 hemoglobin ectomy and medial facetectomy with possible microdiscectomy. Patient has failed a reasonable attempt at conservative therapy.  I explained the condition and procedure to the patient and answered any questions.  Patient wishes to proceed with procedure as planned. Understands risks/ benefits and typical outcomes of procedure.   Sheri Rivera 07/22/2024 1:55 PM

## 2024-07-22 NOTE — Transfer of Care (Signed)
 Immediate Anesthesia Transfer of Care Note  Patient: Sheri Rivera  Procedure(s) Performed: LUMBAR FOUR-FIVE, LUMBAR FIVE-SACRAL ONE LAMINECTOMY AND FORAMINOTOMY (Left: Back)  Patient Location: PACU  Anesthesia Type:General  Level of Consciousness: awake and drowsy  Airway & Oxygen Therapy: Patient Spontanous Breathing and Patient connected to face mask oxygen  Post-op Assessment: Report given to RN and Post -op Vital signs reviewed and stable  Post vital signs: Reviewed and stable  Last Vitals:  Vitals Value Taken Time  BP 146/84 07/22/24 17:30  Temp    Pulse 108 07/22/24 17:32  Resp 19 07/22/24 17:32  SpO2 93 % 07/22/24 17:32  Vitals shown include unfiled device data.  Last Pain:  Vitals:   07/22/24 1324  TempSrc:   PainSc: 10-Worst pain ever         Complications: No notable events documented.

## 2024-07-22 NOTE — Anesthesia Preprocedure Evaluation (Addendum)
 Anesthesia Evaluation  Patient identified by MRN, date of birth, ID band Patient awake    Reviewed: Allergy & Precautions, NPO status , Patient's Chart, lab work & pertinent test results  History of Anesthesia Complications Negative for: history of anesthetic complications  Airway Mallampati: II  TM Distance: >3 FB Neck ROM: Full    Dental  (+) Poor Dentition, Missing, Dental Advisory Given, Chipped, Loose   Pulmonary asthma , COPD,  COPD inhaler, Current SmokerPatient did not abstain from smoking. Malignant neoplasm of supraglottis: XRT   breath sounds clear to auscultation + wheezing      Cardiovascular + CAD (non-obstructive, minimal by coronary CT)   Rhythm:Regular Rate:Normal     Neuro/Psych   Anxiety Depression    Chronic back pain    GI/Hepatic Neg liver ROS, hiatal hernia,GERD  Controlled,,  Endo/Other  Hypothyroidism  BMI 32  Renal/GU negative Renal ROS     Musculoskeletal  (+) Arthritis ,  Fibromyalgia -  Abdominal   Peds  Hematology   Anesthesia Other Findings   Reproductive/Obstetrics                              Anesthesia Physical Anesthesia Plan  ASA: 3  Anesthesia Plan: General   Post-op Pain Management: Ofirmev  IV (intra-op)*   Induction: Intravenous  PONV Risk Score and Plan: 2 and Ondansetron  and Dexamethasone   Airway Management Planned: Oral ETT and Video Laryngoscope Planned  Additional Equipment: None  Intra-op Plan:   Post-operative Plan: Extubation in OR  Informed Consent: I have reviewed the patients History and Physical, chart, labs and discussed the procedure including the risks, benefits and alternatives for the proposed anesthesia with the patient or authorized representative who has indicated his/her understanding and acceptance.     Dental advisory given  Plan Discussed with: CRNA and Surgeon  Anesthesia Plan Comments:          Anesthesia Quick Evaluation

## 2024-07-22 NOTE — Plan of Care (Signed)

## 2024-07-22 NOTE — Op Note (Signed)
 07/22/2024  5:20 PM  PATIENT:  Sheri Rivera  63 y.o. female  PRE-OPERATIVE DIAGNOSIS: Lumbar spinal stenosis L4-5 with a back and bilateral leg pain, right greater than left  POST-OPERATIVE DIAGNOSIS:  same  PROCEDURE: Left L4-5 hemoglobin ectomy medial facetectomy and foraminotomies followed by sublaminar decompression to decompress the right lateral recess and central canal  SURGEON:  Alm Molt, MD  ASSISTANTS: Suzen Pean, FNP  ANESTHESIA:   General  EBL: 25 ml  Total I/O In: 200 [IV Piggyback:200] Out: 25 [Blood:25]  BLOOD ADMINISTERED: none  DRAINS: none  SPECIMEN:  none  INDICATION FOR PROCEDURE: This patient presented with back pain with bilateral leg pain right greater than left. Imaging showed moderate to moderately severe stenosis at L4-5 left greater than right with lumbar disc herniation on the left. The patient tried conservative measures without relief. Pain was debilitating. Recommended left L4-5 hemilaminectomy to evaluate the disc herniation followed by sublaminar decompression to decompress the central canal and right lateral recess. Patient understood the risks, benefits, and alternatives and potential outcomes and wished to proceed.  PROCEDURE DETAILS: The patient was taken to the operating room and after induction of adequate generalized endotracheal anesthesia, the patient was rolled into the prone position on the Wilson frame and all pressure points were padded. The lumbar region was cleaned and then prepped with DuraPrep and draped in the usual sterile fashion. 5 cc of local anesthesia was injected and then a dorsal midline incision was made and carried down to the lumbo sacral fascia. The fascia was opened and the paraspinous musculature was taken down in a subperiosteal fashion to expose L4-5 on the left. Intraoperative x-ray confirmed my level, and then I used a combination of the high-speed drill and the Kerrison punches to perform a  hemilaminectomy, medial facetectomy, and foraminotomy at L4-5 on the left. The underlying yellow ligament was opened and removed in a piecemeal fashion to expose the underlying dura and exiting nerve root. I undercut the lateral recess and dissected down until I was medial to and distal to the pedicle. The nerve root was well decompressed. We then gently retracted the nerve root medially with a retractor, coagulated the epidural venous vasculature, and inspected the disc base.  There was a subannular bulge but we did not feel that annulotomy and discectomy was necessary.  We then drilled up under the spinous process of the opposite lamina and removed the yellow ligament from the opposite side to decompress the right lateral recess.  We palpated the pedicle and identified the right L5 nerve root made sure it was well decompressed.  I then palpated with a coronary dilator along the nerve root and into the foramen to assure adequate decompression. I felt no more compression of the nerve root. I irrigated with saline solution containing bacitracin . Achieved hemostasis with bipolar cautery and Surgifoam, lined the dura with Gelfoam, and then closed the fascia with 0 Vicryl. I closed the subcutaneous tissues with 2-0 Vicryl and the subcuticular tissues with 3-0 Vicryl. The skin was then closed with benzoin and Steri-Strips. The drapes were removed, a sterile dressing was applied.  My nurse practitioner was involved in the exposure, safe retraction of the neural elements, the decompression and the closure. the patient was awakened from general anesthesia and transferred to the recovery room in stable condition. At the end of the procedure all sponge, needle and instrument counts were correct.    PLAN OF CARE: Admit for overnight observation  PATIENT DISPOSITION:  PACU -  hemodynamically stable.   Delay start of Pharmacological VTE agent (>24hrs) due to surgical blood loss or risk of bleeding:   yes

## 2024-07-23 ENCOUNTER — Encounter (HOSPITAL_COMMUNITY): Payer: Self-pay | Admitting: Neurological Surgery

## 2024-07-23 ENCOUNTER — Telehealth: Payer: Self-pay | Admitting: Physical Medicine and Rehabilitation

## 2024-07-23 DIAGNOSIS — M48061 Spinal stenosis, lumbar region without neurogenic claudication: Secondary | ICD-10-CM | POA: Diagnosis not present

## 2024-07-23 MED FILL — Thrombin For Soln 5000 Unit: CUTANEOUS | Qty: 2 | Status: AC

## 2024-07-23 NOTE — Telephone Encounter (Signed)
 Patient wanted to let you know that she had back surgery yesterday and is already home.

## 2024-07-23 NOTE — Care Management Obs Status (Signed)
 MEDICARE OBSERVATION STATUS NOTIFICATION   Patient Details  Name: Sheri Rivera MRN: [id number] Date of Birth: May 09, 1961   Medicare Observation Status Notification Given:  Yes    Jon Cruel 07/23/2024, 8:47 AM

## 2024-07-23 NOTE — Progress Notes (Signed)
 Patient alert and oriented, void, ambulate, surgical site clean and dry no sign of infection. D/c instructions explain and given, all questions answered. Patient verbalized understanding. Pt. D/c home per order.

## 2024-07-23 NOTE — Progress Notes (Signed)
 8/28 Patient was discharged early, I spoke to patient telephonically and received confirmation and acknowledgement.

## 2024-07-23 NOTE — Discharge Summary (Signed)
 Physician Discharge Summary  Patient ID: Sheri Rivera MRN: [id number] DOB/AGE: Nov 29, 1960 63 y.o.  Admit date: 07/22/2024 Discharge date: 07/23/2024  Admission Diagnoses: lumbar stenosis with radiculopathy    Discharge Diagnoses: same   Discharged Condition: good  Hospital Course: The patient was admitted on 07/22/2024 and taken to the operating room where the patient underwent lumbar laminectomy L4-5. The patient tolerated the procedure well and was taken to the recovery room and then to the floor in stable condition. The hospital course was routine. There were no complications. The wound remained clean dry and intact. Pt had appropriate back soreness. No complaints of leg pain or new N/T/W. The patient remained afebrile with stable vital signs, and tolerated a regular diet. The patient continued to increase activities, and pain was well controlled with oral pain medications.   Consults: None  Significant Diagnostic Studies:  Results for orders placed or performed during the hospital encounter of 07/22/24  Surgical pcr screen   Collection Time: 07/22/24  1:26 PM   Specimen: Nasal Mucosa; Nasal Swab  Result Value Ref Range   MRSA, PCR NEGATIVE NEGATIVE   Staphylococcus aureus NEGATIVE NEGATIVE    DG Lumbar Spine 2-3 Views Result Date: 07/22/2024 CLINICAL DATA:  Elective surgery. EXAM: LUMBAR SPINE - 2-3 VIEW COMPARISON:  Radiograph 06/11/2024 FINDINGS: Three portable cross-table lateral views of the lumbar spine submitted from the operating room. Film 1 demonstrates surgical instrument posteriorly at the L5 level projecting posterior to the spinous process. Film 2 demonstrates surgical instruments projecting over the L5 spinous process posteriorly. Film 3 demonstrates surgical instruments projecting over the posterior elements at the L4-L5 level. IMPRESSION: Intraoperative localization during lumbar surgery. Electronically Signed   By: Andrea Gasman M.D.   On: 07/22/2024 17:34    MR LUMBAR SPINE WO CONTRAST Result Date: 07/02/2024 EXAM: MRI LUMBAR SPINE 06/30/2024 03:45:04 PM TECHNIQUE: Multiplanar multisequence MRI of the lumbar spine was performed without the administration of intravenous contrast. COMPARISON: Lumbar spine MRI 05/27/24. CLINICAL HISTORY: Kyphoplasty of the L1 vertebral body 06/23/24. FINDINGS: BONES AND ALIGNMENT: Interval postoperative changes of L1 vertebral augmentation. Unchanged moderate height loss and 3 mm retropulsion of the inferior endplate with mild residual marrow edema. Unchanged chronic superior endplate fracture of the L3 vertebral body. No new fracture. SPINAL CORD: The conus terminates at L2. SOFT TISSUES: Moderate fatty atrophy of the paraspinal muscles. Mild degenerative changes of the left greater than right sacroiliac joints. L1-L2: Retropulsion of the L1 inferior endplate results in no more than mild spinal canal stenosis. No significant neural foraminal narrowing. L2-L3: Small disc bulge and left greater than right facet arthropathy without spinal canal stenosis or significant neural foraminal narrowing. L3-L4: Small disc bulge and mild bilateral facet arthropathy. No spinal canal stenosis or neural foraminal narrowing. L4-L5: Left eccentric disc bulge with superimposed small central disc extrusion with slight cranial migration results in moderate spinal canal stenosis with severe narrowing of the left lateral recess resulting in mass effect on the traversing left L5 nerve root in the subarticular zone. Moderate left neural foraminal narrowing at this level. L5-S1: Right eccentric disc bulge and facet arthropathy contribute to moderate right neural foraminal narrowing. No spinal canal stenosis. IMPRESSION: 1. Interval postoperative changes of L1 vertebral augmentation with unchanged moderate height loss, 3 mm retropulsion of the inferior endplate, and mild residual marrow edema. 2. Unchanged chronic superior endplate fracture of the L3 vertebral  body. No new fracture. 3. Moderate spinal canal stenosis at L4-L5 with severe left lateral recess narrowing and  mass effect on the traversing left L5 nerve root. Moderate left neural foraminal narrowing. 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 She has failed conservative management and presents for cement augmentation with balloon kyphoplasty. EXAM: FLUOROSCOPIC GUIDED KYPHOPLASTY OF THE L1 VERTEBRAL BODY COMPARISON:  MRI lumbar spine 05/27/2024 MEDICATIONS: As antibiotic prophylaxis, 2 g Ancef  was ordered pre-procedure and administered intravenously by the Radiology nurse within 1 hour of incision. Additionally, 1 g Tylenol  was administered intravenously by the Radiology nurse prior to the procedure. ANESTHESIA/SEDATION: Moderate (conscious) sedation was employed during this procedure. A total of Versed  1 mg and Fentanyl  75 mcg was administered intravenously by the Radiology nurse. Moderate Sedation Time: 27 minutes. The patient's level of consciousness and vital signs were monitored continuously by radiology nursing throughout the procedure under my direct supervision. FLUOROSCOPY TIME:  Radiation exposure index: 392 mGy, reference air kerma COMPLICATIONS: None immediate. PROCEDURE: The procedure, risks (including but not limited to bleeding, infection, organ damage), benefits, and alternatives were explained to the patient. Questions regarding the procedure were encouraged and answered. The patient understands and consents to the procedure. The patient has suffered a fracture of the L1 vertebra. It is recommended that patients aged 81 years or older be evaluated for possible testing or treatment of osteoporosis. The patient was placed prone on  the fluoroscopic table. The skin overlying the lumbar region was then prepped and draped in the usual sterile fashion. Maximal barrier sterile technique was utilized including caps, mask, sterile gowns, sterile gloves, sterile drape, hand hygiene and skin antiseptic. Intravenous Fentanyl  and Versed  were administered as conscious sedation during continuous cardiorespiratory monitoring by the radiology RN. The left pedicle at L1 was then infiltrated with 1% lidocaine  followed by the advancement of a Kyphon trocar needle through the left pedicle into the posterior one-third of the vertebral body. Subsequently, the osteo drill was advanced to the anterior third of the vertebral body. The osteo drill was retracted. Through the working cannula, a Kyphon inflatable bone tamp 15 x 2.5 was advanced and positioned with the distal marker approximately 5 mm from the anterior aspect of the cortex. Appropriate positioning was confirmed on the AP projection. At this time, the balloon was expanded using contrast via a Kyphon inflation syringe device via micro tubing. Inflations were continued until there was near apposition with the superior end plate. At this time, methylmethacrylate mixture was reconstituted in the Kyphon bone mixing device system. This was then loaded into the delivery mechanism, attached to Kyphon bone fillers. The balloons were deflated and removed followed by the instillation of methylmethacrylate mixture with excellent filling in the AP and lateral projections. No extravasation was noted in the disk spaces or posteriorly into the spinal canal. No epidural venous contamination was seen. The working cannulae and the bone filler were then retrieved and removed. Hemostasis was achieved with manual compression. The patient tolerated the procedure well without immediate postprocedural complication. IMPRESSION: 1. Technically successful L1 vertebral body augmentation using balloon kyphoplasty. 2. Per CMS PQRS  reporting requirements (PQRS Measure 24): Given the patient's age of greater than 50 and the fracture site (hip, distal radius, or spine), the patient should be tested for osteoporosis using DXA, and the appropriate treatment considered based on the DXA results. Electronically Signed   By: Wilkie  Karalee M.D.   On: 06/23/2024 13:35    Antibiotics:  Anti-infectives (From admission, onward)    Start     Dose/Rate Route Frequency Ordered Stop   07/23/24 0000  ceFAZolin  (ANCEF ) IVPB 2g/100 mL premix        2 g 200 mL/hr over 30 Minutes Intravenous Every 8 hours 07/22/24 1840 07/23/24 1559   07/22/24 1330  ceFAZolin  (ANCEF ) IVPB 2g/100 mL premix        2 g 200 mL/hr over 30 Minutes Intravenous On call to O.R. 07/22/24 1255 07/22/24 1653   07/22/24 1300  ceFAZolin  (ANCEF ) 2-4 GM/100ML-% IVPB       Note to Pharmacy: Ezequiel Henri: cabinet override      07/22/24 1300 07/22/24 1624       Discharge Exam: Blood pressure 114/67, pulse 70, temperature 98.2 F (36.8 C), temperature source Oral, resp. rate 18, height 5' 2 (1.575 m), weight 78.9 kg, SpO2 93%. 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   albuterol  108 (90 Base) MCG/ACT inhaler Commonly known as: VENTOLIN  HFA Inhale 2 puffs into the lungs every 6 (six) hours as needed for shortness of breath. Shortness of breath   cephALEXin  500 MG capsule Commonly known as: KEFLEX  Take 1 capsule (500 mg total) by mouth 2 (two) times daily.   fluticasone  50 MCG/ACT nasal spray Commonly known as: FLONASE  Place 1 spray into both nostrils daily.   HYDROcodone -acetaminophen  10-325 MG tablet Commonly known as: NORCO Take 1 tablet by mouth every 4 (four) hours as needed for severe pain (pain score 7-10). Max 5 pills/day- due to increased lumbar stenosis-   levothyroxine  25 MCG tablet Commonly known as: SYNTHROID  Take 25 mcg by mouth daily before breakfast.   LORazepam  0.5 MG tablet Commonly known as: ATIVAN  Take 1 tab PRN extreme anxiety, not to exceed once daily. Use sparingly. Future Rx only at the discretion of the primary care provider.   methocarbamol  500 MG tablet Commonly known as: ROBAXIN  Take 1 tablet (500 mg total) by mouth every 6 (six) hours as needed for muscle spasms.   predniSONE  10 MG (21) Tbpk tablet Commonly known as: STERAPRED UNI-PAK 21 TAB Take 6 tabs on day 1, then 5 tabs on day 2, 4 tabs on day 3, 3 tab on day 4, 2 tabs on day 5, 1 tab on day 6   rosuvastatin  10 MG tablet Commonly known as: CRESTOR  Take 10 mg by mouth daily.   sertraline  50 MG tablet Commonly known as: ZOLOFT  Take 50 mg by mouth daily.   Trelegy Ellipta  200-62.5-25 MCG/ACT Aepb Generic drug: Fluticasone -Umeclidin-Vilant Take 1 puff by mouth daily.   Vitamin D  (Ergocalciferol ) 1.25 MG (50000 UNIT) Caps capsule Commonly known as: DRISDOL  Take 50,000 Units by mouth once a week.        Disposition: home   Final Dx: lumbar laminectomy L4-5  Discharge Instructions      Remove dressing in 72 hours   Complete by:  As directed    Call MD for:  difficulty breathing, headache or visual disturbances   Complete by: As directed    Call MD for:  persistant nausea and vomiting   Complete by: As directed    Call MD for:  redness, tenderness, or signs of infection (pain, swelling, redness, odor or green/yellow discharge around incision site)   Complete by: As directed    Call MD for:  severe uncontrolled pain   Complete by: As directed    Call MD for:  temperature >100.4   Complete by: As directed    Diet - low sodium heart healthy   Complete by: As directed    Increase activity slowly   Complete by: As directed           Signed: Alm GORMAN Molt 07/23/2024, 6:17 AM

## 2024-07-23 NOTE — Evaluation (Addendum)
 Occupational Therapy Evaluation Patient Details Name: Sheri Rivera MRN: [id number] DOB: June 04, 1961 Today's Date: 07/23/2024   History of Present Illness   Pt is a 63 y/o female presenting on 8/27 for lumbar laminectomy L4-5,PMH includes: anxiety, arthritis, COPD, DDD, fibromyalgia, herpes, IBS, R THA.     Clinical Impressions Patient admitted for above and presents with problem list below.  PTA pt was independent with ADLs, some assist for IADLs as needed. Patient was educated on back precautions, ADL compensatory techniques, AE/DME, mobility progression, safety and recommendations.  Today, pt demonstrated ability to complete transfers using RW with modified independence, functional mobility using RW with modified independence, and ADLs with up to modified independence.  She requires cueing for posture and RW mgmt, intermittent cueing for back precaution adherence.  Encouraged use of RW vs cane for pain and posture. Discussed smoking cessation for healing.  At discharge, pt will have support from family and friends.  Based on performance today, no further OT needs identified.  OT will sign off.      If plan is discharge home, recommend the following:   Assistance with cooking/housework;Assist for transportation     Functional Status Assessment   Patient has had a recent decline in their functional status and demonstrates the ability to make significant improvements in function in a reasonable and predictable amount of time.     Equipment Recommendations   None recommended by OT     Recommendations for Other Services         Precautions/Restrictions   Precautions Precautions: Back Precaution Booklet Issued: Yes (comment) Recall of Precautions/Restrictions: Intact Precaution/Restrictions Comments: reviewed with pt, requires cueing to adhere functionally Required Braces or Orthoses:  (no brace needed order) Restrictions Weight Bearing Restrictions Per Provider Order:  No     Mobility Bed Mobility               General bed mobility comments: OOB upon entry    Transfers Overall transfer level: Modified independent Equipment used: Rolling walker (2 wheels)               General transfer comment: cueing for hand placement and posture, but no assist required      Balance Overall balance assessment: No apparent balance deficits (not formally assessed)                                         ADL either performed or assessed with clinical judgement   ADL Overall ADL's : Modified independent                                       General ADL Comments: reviewed back precautions and recommendations for compenstory techniques     Vision   Vision Assessment?: No apparent visual deficits     Perception         Praxis         Pertinent Vitals/Pain Pain Assessment Pain Assessment: Faces Faces Pain Scale: Hurts a little bit Pain Location: back- incisional Pain Descriptors / Indicators: Discomfort, Operative site guarding Pain Intervention(s): Limited activity within patient's tolerance, Monitored during session, Repositioned     Extremity/Trunk Assessment Upper Extremity Assessment Upper Extremity Assessment: Overall WFL for tasks assessed   Lower Extremity Assessment Lower Extremity Assessment: Overall WFL for tasks assessed   Cervical /  Trunk Assessment Cervical / Trunk Assessment: Back Surgery   Communication Communication Communication: No apparent difficulties   Cognition Arousal: Alert Behavior During Therapy: WFL for tasks assessed/performed Cognition: No apparent impairments                               Following commands: Intact       Cueing  General Comments   Cueing Techniques: Verbal cues      Exercises     Shoulder Instructions      Home Living Family/patient expects to be discharged to:: Private residence Living Arrangements: Alone Available  Help at Discharge: Family;Available PRN/intermittently Type of Home: Apartment Home Access: Level entry     Home Layout: One level     Bathroom Shower/Tub: Chief Strategy Officer: Handicapped height     Home Equipment: Grab bars - toilet;Grab bars - tub/shower;Rolling Walker (2 wheels);Cane - single point;Rollator (4 wheels);Tub bench          Prior Functioning/Environment Prior Level of Function : Independent/Modified Independent;Driving             Mobility Comments: cane vs RW ADLs Comments: ind ADLs, assist with IADLs as needed    OT Problem List: Decreased knowledge of use of DME or AE;Pain;Decreased knowledge of precautions   OT Treatment/Interventions:        OT Goals(Current goals can be found in the care plan section)   Acute Rehab OT Goals Patient Stated Goal: home OT Goal Formulation: With patient   OT Frequency:       Co-evaluation              AM-PAC OT 6 Clicks Daily Activity     Outcome Measure Help from another person eating meals?: None Help from another person taking care of personal grooming?: None Help from another person toileting, which includes using toliet, bedpan, or urinal?: None Help from another person bathing (including washing, rinsing, drying)?: None Help from another person to put on and taking off regular upper body clothing?: None Help from another person to put on and taking off regular lower body clothing?: None 6 Click Score: 24   End of Session Equipment Utilized During Treatment: Rolling walker (2 wheels) Nurse Communication: Mobility status  Activity Tolerance: Patient tolerated treatment well Patient left: with call bell/phone within reach;Other (comment) (sitting EOB)  OT Visit Diagnosis: Other abnormalities of gait and mobility (R26.89);Pain Pain - part of body:  (back)                Time: 9190-9175 OT Time Calculation (min): 15 min Charges:  OT General Charges $OT Visit: 1 Visit OT  Evaluation $OT Eval Low Complexity: 1 Low  Sheri Rivera, OT Acute Rehabilitation Services Office [phone] Secure Chat Preferred    Sheri Rivera 07/23/2024, 8:35 AM

## 2024-07-31 NOTE — Telephone Encounter (Signed)
 mistake

## 2024-08-05 ENCOUNTER — Telehealth: Payer: Self-pay

## 2024-08-05 MED ORDER — HYDROCODONE-ACETAMINOPHEN 10-325 MG PO TABS: 1.0000 | ORAL_TABLET | ORAL | 0 refills | Status: DC | PRN

## 2024-08-05 NOTE — Telephone Encounter (Signed)
 Sheri Rivera called for the Hydrocodone  10/325 MG refill:  PMP report:  Filled  Written  ID  Drug  QTY  Days  Prescriber  RX #  Dispenser  Refill  Daily Dose*  Pymt Type  PMP  07/09/2024 07/07/2024 1  Hydrocodone -Acetamin 10-325 Mg 125.00 30 Me Lov [id number] [facility] ([id number]) 0/0 41.67 MME Medicare [place] 07/07/2024 07/07/2024 1  Hydrocodone -Acetamin 10-325 Mg 25.00 5 Me Lov [id number] [facility] ([id number]) 0/0 50.00 MME Medicare [place] 06/19/2024 06/19/2024 1  Hydrocodone -Acetamin 7.5-325 120.00 30 Me Lov [id number] [facility] ([id number]) 0/0 30.00 MME Medicare 

## 2024-09-01 ENCOUNTER — Other Ambulatory Visit: Payer: Self-pay

## 2024-09-01 MED ORDER — HYDROCODONE-ACETAMINOPHEN 10-325 MG PO TABS: 1.0000 | ORAL_TABLET | ORAL | 0 refills | Status: DC | PRN

## 2024-09-01 NOTE — Telephone Encounter (Signed)
 PMR report:  Filled  Written  ID  Drug  QTY  Days  Prescriber  RX #  Dispenser  Refill  Daily Dose*  Pymt Type  PMP  08/06/2024 08/05/2024 1  Hydrocodone -Acetamin 10-325 Mg 150.00 30 Me Lov [id number] [facility] ([id number]) 0/0 50.00 MME Medicare [place] 07/09/2024 07/07/2024 1  Hydrocodone -Acetamin 10-325 Mg 125.00 30 Me Lov [id number] [facility] ([id number]) 0/0 41.67 MME Medicare [place]

## 2024-09-18 ENCOUNTER — Telehealth: Payer: Self-pay | Admitting: Physical Medicine and Rehabilitation

## 2024-09-18 NOTE — Telephone Encounter (Signed)
 She had an emergency MRI yesterday and back surgery seven weeks ago . She asked if you would be willing to try different med. Also she requested you call her.

## 2024-09-21 ENCOUNTER — Encounter: Admitting: Physical Medicine and Rehabilitation

## 2024-09-23 NOTE — Telephone Encounter (Signed)
 D/w pt- she's stilll having so much pain- and she's s/p another surgery on August and Dr Joshua ordered an MRI  but I cannot see results- since ordered from [facility], not at [facility] imaging or hospital- I asked pt to call Dr Joshua and get results of MRI  She reports being frail- and barely able to get OOB- Dr Joshua said would write for therapy, but hasn't heard form anyone- explained until we see her, cnanot write for therapy due to needing to see in 90 days to do therapy/covered by insurance.   Sounds like she also cannot care for her 3 dogs- she cannot take outside so things are really bad, usually in bed- and her son died last year, so no one to take to appointments.   I explained we cannot write for any more pain meds until we see her- and cannot do telehealth anymore per the current administration federally.   I asked her to call clinic and be seen by [facility] in the next 2 weeks so can get refill of pain meds.   Keep appt with me 11/26  We will work through things 1 step at a time, buit we literally cannot intervene until we see her

## 2024-09-28 ENCOUNTER — Encounter: Payer: Self-pay | Admitting: Radiology

## 2024-09-30 ENCOUNTER — Encounter: Attending: Registered Nurse | Admitting: Registered Nurse

## 2024-09-30 ENCOUNTER — Encounter: Payer: Self-pay | Admitting: Registered Nurse

## 2024-09-30 VITALS — BP 132/90 | HR 86 | Ht 62.0 in | Wt 174.0 lb

## 2024-09-30 DIAGNOSIS — G894 Chronic pain syndrome: Secondary | ICD-10-CM | POA: Diagnosis present

## 2024-09-30 DIAGNOSIS — M792 Neuralgia and neuritis, unspecified: Secondary | ICD-10-CM | POA: Diagnosis present

## 2024-09-30 DIAGNOSIS — M48062 Spinal stenosis, lumbar region with neurogenic claudication: Secondary | ICD-10-CM | POA: Insufficient documentation

## 2024-09-30 DIAGNOSIS — M5442 Lumbago with sciatica, left side: Secondary | ICD-10-CM | POA: Diagnosis present

## 2024-09-30 DIAGNOSIS — M545 Low back pain, unspecified: Secondary | ICD-10-CM | POA: Diagnosis not present

## 2024-09-30 DIAGNOSIS — M797 Fibromyalgia: Secondary | ICD-10-CM | POA: Diagnosis not present

## 2024-09-30 DIAGNOSIS — G8929 Other chronic pain: Secondary | ICD-10-CM | POA: Diagnosis present

## 2024-09-30 DIAGNOSIS — M546 Pain in thoracic spine: Secondary | ICD-10-CM | POA: Insufficient documentation

## 2024-09-30 DIAGNOSIS — M4856XG Collapsed vertebra, not elsewhere classified, lumbar region, subsequent encounter for fracture with delayed healing: Secondary | ICD-10-CM | POA: Diagnosis present

## 2024-09-30 DIAGNOSIS — Z9889 Other specified postprocedural states: Secondary | ICD-10-CM | POA: Diagnosis present

## 2024-09-30 DIAGNOSIS — Z79891 Long term (current) use of opiate analgesic: Secondary | ICD-10-CM | POA: Insufficient documentation

## 2024-09-30 DIAGNOSIS — Z5181 Encounter for therapeutic drug level monitoring: Secondary | ICD-10-CM | POA: Insufficient documentation

## 2024-09-30 DIAGNOSIS — M5441 Lumbago with sciatica, right side: Secondary | ICD-10-CM | POA: Insufficient documentation

## 2024-09-30 MED ORDER — HYDROCODONE-ACETAMINOPHEN 10-325 MG PO TABS: 1.0000 | ORAL_TABLET | ORAL | 0 refills | Status: DC | PRN

## 2024-09-30 NOTE — Progress Notes (Signed)
 Subjective:    Patient ID: Sheri Rivera, female    DOB: 08/21/1961, 63 y.o.   MRN: [id number]  HPI: Sheri Rivera is a 63 y.o. female who returns for follow up appointment for chronic pain and medication refill. She states her pain is located in her mid- lower back, reports her pain has increased since her back surgery. Medication was reviewed, she didn't want medication changed. She rates her  pain 10. Her current exercise regime is walking and performing stretching exercises.  She underwent on 07/22/2024: Dr. Joshua LUMBAR FOUR-FIVE, LUMBAR FIVE-SACRAL ONE LAMINECTOMY AND FORAMINOTOMY Left General  Laminectomy and Foraminotomy - L4-L5 - left, sublaminar decompression and possible diskectomy       Ms. Bainter Morphine  equivalent is 50.00 MME.   Oral Swab was Performed today.    Pain Inventory Average Pain 10 Pain Right Now 10 My pain is constant, sharp, stabbing, and aching  In the last 24 hours, has pain interfered with the following? General activity 10 Relation with others 10 Enjoyment of life 10 What TIME of day is your pain at its worst? morning , daytime, evening, and night Sleep (in general) Poor  Pain is worse with: walking, bending, sitting, inactivity, standing, and some activites Pain improves with: rest, heat/ice, medication, and   Relief from Meds: 4  Family History  Problem Relation Age of Onset   Lung cancer Mother    Arthritis Mother        Rheumatoid   Bursitis Mother    Asthma Mother    Heart attack Father 24       Died of MI   Obesity Father    Fibromyalgia Sister    Asthma Child    Social History   Socioeconomic History   Marital status: Divorced    Spouse name: Not on file   Number of children: 1   Years of education: Not on file   Highest education level: Not on file  Occupational History    Employer: [employer]  Tobacco Use   Smoking status: Every Day    Current packs/day: 1.00    Average packs/day: 1 pack/day for 45.8  years (45.8 ttl pk-yrs)    Types: Cigarettes    Start date: 04-Nov-1979   Smokeless tobacco: Never   Tobacco comments:    She is smoking 1-1.5 ppd 09/27/23  Vaping Use   Vaping status: Never Used  Substance and Sexual Activity   Alcohol use: No   Drug use: No   Sexual activity: Not on file  Other Topics Concern   Not on file  Social History Narrative   She lives by herself. Pt has 1 biological daughter and 1 adopted daughter. Biological son passed away 04-Nov-2023   Social Drivers of Health   Financial Resource Strain: Not on file  Food Insecurity: No Food Insecurity (10/02/2023)   Hunger Vital Sign    Worried About Running Out of Food in the Last Year: Never true    Ran Out of Food in the Last Year: Never true  Transportation Needs: No Transportation Needs (10/02/2023)   PRAPARE - Administrator, Civil Service (Medical): No    Lack of Transportation (Non-Medical): No  Physical Activity: Not on file  Stress: Not on file  Social Connections: Unknown (05/16/2022)   Received from [facility]   Social Network    Social Network: Not on file   Past Surgical History:  Procedure Laterality Date   CHOLECYSTECTOMY  DIRECT LARYNGOSCOPY N/A 07/11/2018   Procedure: DIRECT LARYNGOSCOPY WITH BIOPSY;  Surgeon: Ethyl Lonni BRAVO, MD;  Location: [facility];  Service: ENT;  Laterality: N/A;   HIATAL HERNIA REPAIR     IR KYPHO LUMBAR INC FX REDUCE BONE BX UNI/BIL CANNULATION INC/IMAGING  06/23/2024   IR RADIOLOGIST EVAL & MGMT  06/19/2024   LUMBAR LAMINECTOMY/DECOMPRESSION MICRODISCECTOMY Left 07/22/2024   Procedure: LUMBAR FOUR-FIVE, LUMBAR FIVE-SACRAL ONE LAMINECTOMY AND FORAMINOTOMY;  Surgeon: Joshua Alm Hamilton, MD;  Location: [facility] OR;  Service: Neurosurgery;  Laterality: Left;  Laminectomy and Foraminotomy - L4-L5 - left, sublaminar decompression and possible diskectomy   TOTAL HIP ARTHROPLASTY Right 10/02/2023   Procedure: RIGHT TOTAL HIP ARTHROPLASTY ANTERIOR APPROACH;   Surgeon: Barbarann Oneil BROCKS, MD;  Location: [facility] OR;  Service: Orthopedics;  Laterality: Right;  NEEDS RNFA   TUBAL LIGATION  11/26/1981   Past Surgical History:  Procedure Laterality Date   CHOLECYSTECTOMY     DIRECT LARYNGOSCOPY N/A 07/11/2018   Procedure: DIRECT LARYNGOSCOPY WITH BIOPSY;  Surgeon: Ethyl Lonni BRAVO, MD;  Location: [facility];  Service: ENT;  Laterality: N/A;   HIATAL HERNIA REPAIR     IR KYPHO LUMBAR INC FX REDUCE BONE BX UNI/BIL CANNULATION INC/IMAGING  06/23/2024   IR RADIOLOGIST EVAL & MGMT  06/19/2024   LUMBAR LAMINECTOMY/DECOMPRESSION MICRODISCECTOMY Left 07/22/2024   Procedure: LUMBAR FOUR-FIVE, LUMBAR FIVE-SACRAL ONE LAMINECTOMY AND FORAMINOTOMY;  Surgeon: Joshua Alm Hamilton, MD;  Location: [facility] OR;  Service: Neurosurgery;  Laterality: Left;  Laminectomy and Foraminotomy - L4-L5 - left, sublaminar decompression and possible diskectomy   TOTAL HIP ARTHROPLASTY Right 10/02/2023   Procedure: RIGHT TOTAL HIP ARTHROPLASTY ANTERIOR APPROACH;  Surgeon: Barbarann Oneil BROCKS, MD;  Location: [facility] OR;  Service: Orthopedics;  Laterality: Right;  NEEDS RNFA   TUBAL LIGATION  11/26/1981   Past Medical History:  Diagnosis Date   Anxiety 11/11/2011   Aortic atherosclerosis    Arthritis    Asthma    Bursitis    right shoulder   Chronic back pain    Chronic neck pain    Chronic shoulder pain    COPD (chronic obstructive pulmonary disease) (HCC)    continues to smoke 1ppd   DDD (degenerative disc disease)    Depression    Fibromyalgia    GERD (gastroesophageal reflux disease)    Heart murmur    as a child per pt   Herpes    History of hiatal hernia    repaired   History of laryngeal cancer    History of radiation therapy 08/12/18- 10/03/18   Larynx, prescribed dose of 63 Gy in 28 fractions. She chose to stop treatments after 27 fractions.    Hypothyroidism    IBS (irritable bowel syndrome)    Lumbar radiculopathy    Seasonal allergies    Smoker    Thyroid  nodule     multinodular thyroid  goiter   BP (!) 132/90 (BP Location: Left Arm, Patient Position: Sitting, Cuff Size: Normal)   Pulse 86   Ht 5' 2 (1.575 m)   Wt 174 lb (78.9 kg)   SpO2 90%   BMI 31.83 kg/m   Opioid Risk Score:   Fall Risk Score:  `1  Depression screen PHQ 2/9     02/03/2024    1:07 PM 02/03/2024    1:06 PM 09/09/2023    2:41 PM 12/12/2022   11:24 AM 01/19/2022    3:12 PM 09/01/2021    1:26 PM  Depression screen PHQ  2/9  Decreased Interest 1 0 1 0 1 3  Down, Depressed, Hopeless 1 0 1 0 0 2  PHQ - 2 Score 2 0 2 0 1 5  Altered sleeping      1  Tired, decreased energy      2  Change in appetite      0  Feeling bad or failure about yourself       0  Trouble concentrating      0  Moving slowly or fidgety/restless      3  Suicidal thoughts      0  PHQ-9 Score      11  Difficult doing work/chores      Extremely dIfficult      Review of Systems  Musculoskeletal:  Positive for back pain.       Back pain (recent back surgery and increase in back pain.  Patient reports an increase in back pain.  MRI finding Fracture of L1)  All other systems reviewed and are negative.      Objective:   Physical Exam Vitals and nursing note reviewed.  Constitutional:      Appearance: Normal appearance.  Cardiovascular:     Rate and Rhythm: Normal rate and regular rhythm.     Pulses: Normal pulses.     Heart sounds: Normal heart sounds.  Pulmonary:     Effort: Pulmonary effort is normal.     Breath sounds: Normal breath sounds.  Musculoskeletal:     Cervical back: Normal range of motion and neck supple.     Comments: Normal Muscle Bulk and Muscle Testing Reveals:  Upper Extremities: Decreased ROM 45 Degrees and Muscle Strength 5/5 Thoracic and Lumbar Hypersensitivity Lower Extremities: Right: Decreased ROM and Muscle Strength 5/5 Right Lower Extremity Flexion on Buttock Left Lower Extremity: Full ROM and Muscle Strength 5/5 Arises from Table slowly using walker for  support Antalgic Gait     Skin:    General: Skin is warm and dry.  Neurological:     Mental Status: She is alert and oriented to person, place, and time.  Psychiatric:        Mood and Affect: Mood normal.        Behavior: Behavior normal.          Assessment & Plan:  Lumbar Spinal Stenosis: S/P: on 07/22/2024: Dr Joshua  LUMBAR FOUR-FIVE, LUMBAR FIVE-SACRAL ONE LAMINECTOMY AND FORAMINOTOMY Left General  Laminectomy and Foraminotomy - L4-L5 - left, sublaminar decompression and possible diskectomy     2. Chronic Right Hip Pain/ Right Hip Osteoarthritis:  Ortho Following. Continue to Monitor. Ms. Coonan reports she is awaiting a surgical date for Hip Replacement. 09/30/2024 3. Fibromyalgia: Continue HEP as Tolerated. Continue current medication regimen. Continue to monitor. 1105/2025 4. Chronic Pain Syndrome: Continue Hydrocodone  10/325 mg one tablet 5 times a day as needed for pain #150. We will continue the opioid monitoring program, this consists of regular clinic visits, examinations, urine drug screen, pill counts as well as use of [place]  Controlled Substance Reporting system. A 12 month History has been reviewed on the [place]  Controlled Substance Reporting System on 09/30/2024   F/U with Dr Cornelio, she has a scheduled appointment

## 2024-10-01 ENCOUNTER — Ambulatory Visit: Admitting: Family Medicine

## 2024-10-04 LAB — DRUG TOX MONITOR 1 W/CONF, ORAL FLD
Amphetamines: NEGATIVE ng/mL (ref <10)
Barbiturates: NEGATIVE ng/mL (ref <10)
Benzodiazepines: NEGATIVE ng/mL (ref <0.50)
Buprenorphine: NEGATIVE ng/mL (ref <0.10)
Cocaine: NEGATIVE ng/mL (ref <5.0)
Codeine: NEGATIVE ng/mL (ref <2.5)
Cotinine: 199.5 ng/mL — ABNORMAL HIGH (ref <5.0)
Dihydrocodeine: 29.8 ng/mL — ABNORMAL HIGH (ref <2.5)
Fentanyl: NEGATIVE ng/mL (ref <0.10)
Heroin Metabolite: NEGATIVE ng/mL (ref <1.0)
Hydrocodone: >250 ng/mL — ABNORMAL HIGH (ref <2.5)
Hydromorphone: NEGATIVE ng/mL (ref <2.5)
MARIJUANA: NEGATIVE ng/mL (ref <2.5)
MDMA: NEGATIVE ng/mL (ref <10)
Meprobamate: NEGATIVE ng/mL (ref <2.5)
Methadone: NEGATIVE ng/mL (ref <5.0)
Morphine: NEGATIVE ng/mL (ref <2.5)
Nicotine Metabolite: POSITIVE ng/mL — AB (ref <5.0)
Norhydrocodone: 27.4 ng/mL — ABNORMAL HIGH (ref <2.5)
Noroxycodone: NEGATIVE ng/mL (ref <2.5)
Opiates: POSITIVE ng/mL — AB (ref <2.5)
Oxycodone: NEGATIVE ng/mL (ref <2.5)
Oxymorphone: NEGATIVE ng/mL (ref <2.5)
Phencyclidine: NEGATIVE ng/mL (ref <10)
Tapentadol: NEGATIVE ng/mL (ref <5.0)
Tramadol: NEGATIVE ng/mL (ref <5.0)
Zolpidem: NEGATIVE ng/mL (ref <5.0)

## 2024-10-04 LAB — DRUG TOX ALC METAB W/CON, ORAL FLD: Alcohol Metabolite: NEGATIVE ng/mL (ref <25)

## 2024-10-08 ENCOUNTER — Ambulatory Visit: Admitting: Family Medicine

## 2024-10-21 ENCOUNTER — Encounter: Admitting: Physical Medicine and Rehabilitation

## 2024-10-21 ENCOUNTER — Encounter: Payer: Self-pay | Admitting: Physical Medicine and Rehabilitation

## 2024-10-21 VITALS — BP 127/89 | Ht 62.0 in | Wt 179.0 lb

## 2024-10-21 DIAGNOSIS — M4856XG Collapsed vertebra, not elsewhere classified, lumbar region, subsequent encounter for fracture with delayed healing: Secondary | ICD-10-CM | POA: Diagnosis not present

## 2024-10-21 DIAGNOSIS — M5441 Lumbago with sciatica, right side: Secondary | ICD-10-CM

## 2024-10-21 DIAGNOSIS — M792 Neuralgia and neuritis, unspecified: Secondary | ICD-10-CM

## 2024-10-21 DIAGNOSIS — M545 Low back pain, unspecified: Secondary | ICD-10-CM | POA: Diagnosis not present

## 2024-10-21 DIAGNOSIS — Z9889 Other specified postprocedural states: Secondary | ICD-10-CM | POA: Diagnosis not present

## 2024-10-21 DIAGNOSIS — M48062 Spinal stenosis, lumbar region with neurogenic claudication: Secondary | ICD-10-CM | POA: Diagnosis not present

## 2024-10-21 DIAGNOSIS — M5442 Lumbago with sciatica, left side: Secondary | ICD-10-CM

## 2024-10-21 DIAGNOSIS — G8929 Other chronic pain: Secondary | ICD-10-CM

## 2024-10-21 MED ORDER — MORPHINE SULFATE ER 15 MG PO TBCR: 15.0000 mg | EXTENDED_RELEASE_TABLET | Freq: Two times a day (BID) | ORAL | 0 refills | Status: AC

## 2024-10-21 MED ORDER — CYCLOBENZAPRINE HCL 10 MG PO TABS: 10.0000 mg | ORAL_TABLET | Freq: Three times a day (TID) | ORAL | 5 refills | Status: AC | PRN

## 2024-10-21 NOTE — Progress Notes (Signed)
 Subjective:    Patient ID: Sheri Rivera, female    DOB: [birth year], 63 y.o.   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  Has appt with ENT asap due to more lumps she can feel and also dx'd with psoriatic arthritis.  Here for f/u on chronic pain.    3 new  compression fractures Sending her to  [facility] to see doctor to see if fracturing due to osteoporosis.  Could heal on own, but  not sure when   Owed 10k of medical debt   Has never had hip pain til after hip replacement last year.  Only had back pain 3rd week after back surgery spinal stenosis surgery.   Couldn't tolerate pain- was in September 2025.  Did another MRI 2-3 weeks ago- and showed the 3 new fx's.    Struggling with pain- cannot take a break from the pain.  When walks, hurts so bad   Legs started swelling 4-5 days ago- couldn't even get shoes on  Now numb/tingling- since swelling went down- yesterday  can finally get crocs on.   Now N/T in calves last few days.    When leans over  feels somewhat better- cannot stand up straight up- due to pain - literally leans over at 60+ degrees to help keep pain under some control.   Flexeril - helped the muscle spasms- somewhat- not completely.   Had a week that started to get better- spinal stenosis- and then spiked again.   Is probably Ortho? Rheum - but for osteoporosis- never had a bone density scan.   Cannot mop, cannot cook, living off microwave food- so much sodium Cannot clean her house.   Tearful due to  depression.   Scared that will have to rehome her dogs.   Cannot think clearly due to pain and making bad financial decisions due to pain Pain Inventory Average Pain 10 Pain Right Now 9 My pain is constant  In the last  24 hours, has pain interfered with the following? General activity 10 Relation with others 10 Enjoyment of life 10 What TIME of day is your pain at its worst? morning , daytime, evening, and night Sleep (in general) Fair  Pain is worse with: walking, bending, sitting, standing, and some activites Pain improves with: none Relief from Meds: 0  Family History  Problem Relation Age of Onset   Lung cancer Mother    Arthritis Mother        Rheumatoid   Bursitis Mother    Asthma Mother    Heart attack Father 16       Died of MI   Obesity Father    Fibromyalgia Sister    Asthma Child    Social History   Socioeconomic History   Marital status: Divorced    Spouse name: Not on file   Number of children: 1   Years of education: Not on file   Highest education level: Not on file  Occupational History    Employer: [employer]  Tobacco Use   Smoking status: Every Day    Current packs/day: 1.00    Average packs/day: 1 pack/day for 45.9 years (45.9 ttl pk-yrs)    Types: Cigarettes    Start date: 40  Smokeless tobacco: Never   Tobacco comments:    She is smoking 1-1.5 ppd 09/27/23  Vaping Use   Vaping status: Never Used  Substance and Sexual Activity   Alcohol use: No   Drug use: No   Sexual activity: Not on file  Other Topics Concern   Not on file  Social History Narrative   She lives by herself. Pt has 1 biological daughter and 1 adopted daughter. Biological son passed away 2023/11/20   Social Drivers of Health   Financial Resource Strain: Not on file  Food Insecurity: No Food Insecurity (10/02/2023)   Hunger Vital Sign    Worried About Running Out of Food in the Last Year: Never true    Ran Out of Food in the Last Year: Never true  Transportation Needs: No Transportation Needs (10/02/2023)   PRAPARE - Administrator, Civil Service (Medical): No    Lack of Transportation (Non-Medical): No  Physical Activity: Not on file  Stress: Not on file  Social  Connections: Unknown (05/16/2022)   Received from [facility]   Social Network    Social Network: Not on file   Past Surgical History:  Procedure Laterality Date   CHOLECYSTECTOMY     DIRECT LARYNGOSCOPY N/A 07/11/2018   Procedure: DIRECT LARYNGOSCOPY WITH BIOPSY;  Surgeon: Ethyl Lonni BRAVO, MD;  Location: [facility];  Service: ENT;  Laterality: N/A;   HIATAL HERNIA REPAIR     IR KYPHO LUMBAR INC FX REDUCE BONE BX UNI/BIL CANNULATION INC/IMAGING  06/23/2024   IR RADIOLOGIST EVAL & MGMT  06/19/2024   LUMBAR LAMINECTOMY/DECOMPRESSION MICRODISCECTOMY Left 07/22/2024   Procedure: LUMBAR FOUR-FIVE, LUMBAR FIVE-SACRAL ONE LAMINECTOMY AND FORAMINOTOMY;  Surgeon: Joshua Alm Hamilton, MD;  Location: [facility] OR;  Service: Neurosurgery;  Laterality: Left;  Laminectomy and Foraminotomy - L4-L5 - left, sublaminar decompression and possible diskectomy   TOTAL HIP ARTHROPLASTY Right 10/02/2023   Procedure: RIGHT TOTAL HIP ARTHROPLASTY ANTERIOR APPROACH;  Surgeon: Barbarann Oneil BROCKS, MD;  Location: [facility] OR;  Service: Orthopedics;  Laterality: Right;  NEEDS RNFA   TUBAL LIGATION  11/26/1981   Past Surgical History:  Procedure Laterality Date   CHOLECYSTECTOMY     DIRECT LARYNGOSCOPY N/A 07/11/2018   Procedure: DIRECT LARYNGOSCOPY WITH BIOPSY;  Surgeon: Ethyl Lonni BRAVO, MD;  Location: [facility];  Service: ENT;  Laterality: N/A;   HIATAL HERNIA REPAIR     IR KYPHO LUMBAR INC FX REDUCE BONE BX UNI/BIL CANNULATION INC/IMAGING  06/23/2024   IR RADIOLOGIST EVAL & MGMT  06/19/2024   LUMBAR LAMINECTOMY/DECOMPRESSION MICRODISCECTOMY Left 07/22/2024   Procedure: LUMBAR FOUR-FIVE, LUMBAR FIVE-SACRAL ONE LAMINECTOMY AND FORAMINOTOMY;  Surgeon: Joshua Alm Hamilton, MD;  Location: [facility] OR;  Service: Neurosurgery;  Laterality: Left;  Laminectomy and Foraminotomy - L4-L5 - left, sublaminar decompression and possible diskectomy   TOTAL HIP ARTHROPLASTY Right 10/02/2023   Procedure: RIGHT TOTAL HIP  ARTHROPLASTY ANTERIOR APPROACH;  Surgeon: Barbarann Oneil BROCKS, MD;  Location: [facility] OR;  Service: Orthopedics;  Laterality: Right;  NEEDS RNFA   TUBAL LIGATION  11/26/1981   Past Medical History:  Diagnosis Date   Anxiety 11/11/2011   Aortic atherosclerosis    Arthritis    Asthma    Bursitis    right shoulder   Chronic back pain    Chronic neck pain    Chronic shoulder pain    COPD (chronic obstructive pulmonary disease) (HCC)    continues to smoke 1ppd   DDD (degenerative disc disease)  Depression    Fibromyalgia    GERD (gastroesophageal reflux disease)    Heart murmur    as a child per pt   Herpes    History of hiatal hernia    repaired   History of laryngeal cancer    History of radiation therapy 08/12/18- 10/03/18   Larynx, prescribed dose of 63 Gy in 28 fractions. She chose to stop treatments after 27 fractions.    Hypothyroidism    IBS (irritable bowel syndrome)    Lumbar radiculopathy    Seasonal allergies    Smoker    Thyroid  nodule    multinodular thyroid  goiter   BP 127/89   Ht 5' 2 (1.575 m)   Wt 179 lb (81.2 kg)   SpO2 90%   BMI 32.74 kg/m   Opioid Risk Score:   Fall Risk Score:  `1  Depression screen [facility] 2/9     10/21/2024   10:48 AM 02/03/2024    1:07 PM 02/03/2024    1:06 PM 09/09/2023    2:41 PM 12/12/2022   11:24 AM 01/19/2022    3:12 PM 09/01/2021    1:26 PM  Depression screen PHQ 2/9  Decreased Interest 3 1 0 1 0 1 3  Down, Depressed, Hopeless 3 1 0 1 0 0 2  PHQ - 2 Score 6 2 0 2 0 1 5  Altered sleeping       1  Tired, decreased energy       2  Change in appetite       0  Feeling bad or failure about yourself        0  Trouble concentrating       0  Moving slowly or fidgety/restless       3  Suicidal thoughts       0  PHQ-9 Score       11   Difficult doing work/chores       Extremely dIfficult     Data saved with a previous flowsheet row definition    Review of Systems  Musculoskeletal:  Positive for back pain and gait problem.   Psychiatric/Behavioral:  Positive for dysphoric mood.   All other systems reviewed and are negative.      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  Has appt with ENT asap due to more lumps she can feel and also dx'd with psoriatic arthritis.  Here for f/u on chronic pain.  Has 3 new pathologic compression fractures in spine- lumbar/lower thoracic likely due to  osteoporosis   Needs bone density scan-  esp due to  3 new compression fractures. Pathological fractures  2.     MS Contin - pt hesitant about it-  but we discussed how I think was able to handle it- 15 mg 2x/day-  call me next week to let me know how it's going.  To help reduce pain-  at least 8 hours apart- take with a little food.   3. Will keep Norco/hydrocodone  the same for today- but in 1 week, will re-eval increasing to Oxycodone  if need be. Continue same dose of Norco.    4.  Once gets medicaid, then will write for CAP program.    5.  Has allergy to gabapentin  - whole body numb and Duloxetine caused Anaphylaxis lip swelling, etc.  Lyrica almost died due to Anaphylaxis.   Will not add any nerve pain meds to list.    6.  Call or my chart me next Monday-  in morning-  call front desk.   Call me to tell me- if it's working, partially working or not working.   7.  Restart Flexeril /Cyclobenzaprine - 10 mg 3x/day as needed.   8.F/U in 6  weeks- q 6 weeks for a couple of visits-    9. You need to see PCP and Doctor for osteoporosis as soon as you can.     I spent a total of  37   minutes on total care today- >50% coordination of care- due to d/w pt about pain, options and took a lot of encouragement to make this work.   Also d/w how ot get MS Contin - and go from there- about CAP, Flexeril .

## 2024-10-21 NOTE — Patient Instructions (Signed)
 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  Has appt with ENT asap due to more lumps she can feel and also dx'd with psoriatic arthritis.  Here for f/u on chronic pain.  Has 3 new pathologic compression fractures in spine- lumbar/lower thoracic likely due to  osteoporosis   Needs bone density scan-  esp due to  3 new compression fractures. Pathological fractures  2.     MS Contin - pt hesitant about it-  but we discussed how I think was able to handle it- 15 mg 2x/day-  call me next week to let me know how it's going.  To help reduce pain-  at least 8 hours apart- take with a little food.   3. Will keep Norco/hydrocodone  the same for today- but in 1 week, will re-eval increasing to Oxycodone  if need be. Continue same dose of Norco.    4.  Once gets medicaid, then will write for CAP program.    5.  Has allergy to gabapentin  - whole body numb and Duloxetine caused Anaphylaxis lip swelling, etc.  Lyrica almost died due to Anaphylaxis.   Will not add any nerve pain meds to list.    6.  Call or my chart me next Monday-  in morning-  call front desk.   Call me to tell me- if it's working, partially working or not working.   7.  Restart Flexeril /Cyclobenzaprine - 10 mg 3x/day as needed.   8.F/U in 6 weeks- q 6 weeks for a couple of visits-    9. You need to see PCP and Doctor for osteoporosis as soon as you can.

## 2024-10-26 ENCOUNTER — Telehealth: Payer: Self-pay | Admitting: Physical Medicine and Rehabilitation

## 2024-10-26 NOTE — Telephone Encounter (Signed)
 Patient felt drunk taking morphine  and she was out of it this weekend.  She said she won't take anymore of it.

## 2024-10-27 ENCOUNTER — Telehealth: Payer: Self-pay | Admitting: Physical Medicine and Rehabilitation

## 2024-10-27 ENCOUNTER — Ambulatory Visit: Admitting: Family Medicine

## 2024-10-27 MED ORDER — OXYCODONE HCL ER 10 MG PO T12A: 10.0000 mg | EXTENDED_RELEASE_TABLET | Freq: Two times a day (BID) | ORAL | 0 refills | Status: DC

## 2024-10-27 NOTE — Telephone Encounter (Signed)
 Patient returned Dr. Jyl phone call.  I did read her what you put in your telephone encounter.  She did say she would be willing to try the other medication you suggested.  She also wanted to know about having that other medication destroyed.

## 2024-10-27 NOTE — Telephone Encounter (Signed)
 L/M for pt to discuss her MS Contin  that we tried-  Can try Oxycontin  instead since failed MS contin  ie was drunk and almost fell down while taking MS Contin  (so obviously cannot take again).  I truly feel with compression fractures, she needs to have a long acting pain medicine as well as short acting rescue meds, so I think it's worth it to try Oxycontin - which has a different side effect profile.   L/M for pt to call us  back and let us  know-

## 2024-10-28 NOTE — Telephone Encounter (Signed)
 Addressed in another message, please see encounters in patient's chart.

## 2024-10-28 NOTE — Telephone Encounter (Signed)
 LVM for patient letting her know this information, asked that she call back with any questions.

## 2024-11-02 ENCOUNTER — Telehealth: Payer: Self-pay

## 2024-11-02 DIAGNOSIS — G8929 Other chronic pain: Secondary | ICD-10-CM

## 2024-11-02 DIAGNOSIS — M48062 Spinal stenosis, lumbar region with neurogenic claudication: Secondary | ICD-10-CM

## 2024-11-02 DIAGNOSIS — G894 Chronic pain syndrome: Secondary | ICD-10-CM

## 2024-11-02 MED ORDER — HYDROCODONE-ACETAMINOPHEN 10-325 MG PO TABS: 1.0000 | ORAL_TABLET | ORAL | 0 refills | Status: DC | PRN

## 2024-11-02 NOTE — Telephone Encounter (Signed)
Refill request for Hydrocodone/APAP ?

## 2024-11-03 ENCOUNTER — Ambulatory Visit: Admitting: Family Medicine

## 2024-11-06 ENCOUNTER — Encounter: Payer: Self-pay | Admitting: Physical Medicine and Rehabilitation

## 2024-11-06 DIAGNOSIS — M545 Low back pain, unspecified: Secondary | ICD-10-CM

## 2024-11-06 DIAGNOSIS — M48062 Spinal stenosis, lumbar region with neurogenic claudication: Secondary | ICD-10-CM

## 2024-11-06 DIAGNOSIS — G894 Chronic pain syndrome: Secondary | ICD-10-CM

## 2024-11-06 DIAGNOSIS — G8929 Other chronic pain: Secondary | ICD-10-CM

## 2024-11-30 ENCOUNTER — Encounter: Admitting: Physical Medicine and Rehabilitation

## 2024-11-30 NOTE — Telephone Encounter (Signed)
 Pt said they are sick and couldn't come for there appt. She need you to call her meds Hydrocodone   at [facility] at [address]

## 2024-12-01 MED ORDER — HYDROCODONE-ACETAMINOPHEN 10-325 MG PO TABS: 1.0000 | ORAL_TABLET | ORAL | 0 refills | Status: DC | PRN

## 2024-12-02 ENCOUNTER — Telehealth: Payer: Self-pay | Admitting: Physical Medicine and Rehabilitation

## 2024-12-02 MED ORDER — HYDROMORPHONE HCL 2 MG PO TABS: 2.0000 mg | ORAL_TABLET | Freq: Four times a day (QID) | ORAL | 0 refills | Status: AC | PRN

## 2024-12-02 NOTE — Telephone Encounter (Signed)
 Will try dilaudid  for pt-

## 2024-12-08 ENCOUNTER — Telehealth: Payer: Self-pay

## 2024-12-08 NOTE — Telephone Encounter (Signed)
 Oxycontin  ER denied

## 2025-01-01 ENCOUNTER — Other Ambulatory Visit: Payer: Self-pay

## 2025-01-01 DIAGNOSIS — M545 Low back pain, unspecified: Secondary | ICD-10-CM

## 2025-01-01 DIAGNOSIS — G8929 Other chronic pain: Secondary | ICD-10-CM

## 2025-01-01 DIAGNOSIS — M48062 Spinal stenosis, lumbar region with neurogenic claudication: Secondary | ICD-10-CM

## 2025-01-01 DIAGNOSIS — G894 Chronic pain syndrome: Secondary | ICD-10-CM

## 2025-01-01 MED ORDER — HYDROCODONE-ACETAMINOPHEN 10-325 MG PO TABS: 1.0000 | ORAL_TABLET | ORAL | 0 refills | Status: AC | PRN

## 2025-01-01 NOTE — Telephone Encounter (Signed)
 Please send a refill of Hydrocodone  10-325 MG to [facility] on [address]. Call back phone [phone].  PMP Report:  Filled  Written  ID  Drug  QTY  Days  Prescriber  RX #  Dispenser  Refill  Daily Dose*  Pymt Type  PMP  12/04/2024 12/01/2024 1  Hydrocodone -Acetamin 10-325 Mg 150.00 30 Me Lov [id number] [facility] (0531) 0/0 50.00 MME Medicare [place] 11/04/2024 11/02/2024 1  Hydrocodone -Acetamin 10-325 Mg 150.00 30 Me Lov [id number] [facility] ([id number]) 0/0 50.00 MME Medicare [place] 10/21/2024 10/21/2024 1  Morphine  Sulf Er 15 Mg Tablet 14.00 7 Me Lov [id number] [facility] ([id number]) 0/0 30.00 MME Medicare [place]

## 2025-01-18 ENCOUNTER — Encounter: Admitting: Physical Medicine and Rehabilitation
